# Patient Record
Sex: Male | Born: 1940
Health system: Southern US, Community
[De-identification: ages and names within clinical notes are randomized; demographics above are authoritative.]

## PROBLEM LIST (undated history)

## (undated) DIAGNOSIS — I5022 Chronic systolic (congestive) heart failure: Secondary | ICD-10-CM

## (undated) DIAGNOSIS — I340 Nonrheumatic mitral (valve) insufficiency: Secondary | ICD-10-CM

## (undated) DIAGNOSIS — R931 Abnormal findings on diagnostic imaging of heart and coronary circulation: Secondary | ICD-10-CM

## (undated) DIAGNOSIS — R351 Nocturia: Secondary | ICD-10-CM

## (undated) DIAGNOSIS — Z87442 Personal history of urinary calculi: Secondary | ICD-10-CM

## (undated) DIAGNOSIS — E785 Hyperlipidemia, unspecified: Secondary | ICD-10-CM

## (undated) DIAGNOSIS — Z86711 Personal history of pulmonary embolism: Secondary | ICD-10-CM

## (undated) DIAGNOSIS — M1990 Unspecified osteoarthritis, unspecified site: Secondary | ICD-10-CM

## (undated) DIAGNOSIS — I5189 Other ill-defined heart diseases: Secondary | ICD-10-CM

## (undated) DIAGNOSIS — Z951 Presence of aortocoronary bypass graft: Secondary | ICD-10-CM

## (undated) DIAGNOSIS — I4891 Unspecified atrial fibrillation: Secondary | ICD-10-CM

## (undated) DIAGNOSIS — I10 Essential (primary) hypertension: Secondary | ICD-10-CM

## (undated) HISTORY — PX: KNEE ARTHROSCOPY: SUR90

## (undated) HISTORY — PX: URETEROSCOPY WITH HOLMIUM LASER LITHOTRIPSY: SHX6645

## (undated) HISTORY — PX: APPENDECTOMY: SHX54

## (undated) HISTORY — PX: POLYPECTOMY: SHX149

## (undated) HISTORY — PX: TRANSURETHRAL RESECTION OF PROSTATE: SHX73

## (undated) HISTORY — PX: JOINT REPLACEMENT: SHX530

---

## 2004-06-17 ENCOUNTER — Other Ambulatory Visit: Payer: Self-pay

## 2005-11-28 ENCOUNTER — Ambulatory Visit: Payer: Self-pay | Admitting: Gastroenterology

## 2006-04-23 ENCOUNTER — Ambulatory Visit: Payer: Self-pay | Admitting: Urology

## 2006-05-04 ENCOUNTER — Ambulatory Visit: Payer: Self-pay | Admitting: Urology

## 2006-08-10 ENCOUNTER — Ambulatory Visit: Payer: Self-pay | Admitting: Urology

## 2007-09-27 ENCOUNTER — Ambulatory Visit: Payer: Self-pay | Admitting: Urology

## 2008-09-25 ENCOUNTER — Ambulatory Visit: Payer: Self-pay | Admitting: Urology

## 2009-04-16 ENCOUNTER — Ambulatory Visit: Payer: Self-pay | Admitting: Urology

## 2009-04-23 ENCOUNTER — Ambulatory Visit: Payer: Self-pay | Admitting: Urology

## 2009-04-25 ENCOUNTER — Ambulatory Visit: Payer: Self-pay | Admitting: Urology

## 2009-05-08 ENCOUNTER — Ambulatory Visit: Payer: Self-pay | Admitting: Urology

## 2009-05-10 ENCOUNTER — Ambulatory Visit: Payer: Self-pay | Admitting: Urology

## 2009-05-17 ENCOUNTER — Ambulatory Visit: Payer: Self-pay | Admitting: Urology

## 2009-05-21 ENCOUNTER — Ambulatory Visit: Payer: Self-pay | Admitting: Urology

## 2009-09-24 ENCOUNTER — Ambulatory Visit: Payer: Self-pay | Admitting: Urology

## 2010-03-04 ENCOUNTER — Ambulatory Visit: Payer: Self-pay | Admitting: Gastroenterology

## 2010-08-29 ENCOUNTER — Emergency Department: Payer: Self-pay | Admitting: Internal Medicine

## 2010-08-30 ENCOUNTER — Ambulatory Visit: Payer: Self-pay | Admitting: Urology

## 2010-09-02 ENCOUNTER — Ambulatory Visit: Payer: Self-pay | Admitting: Urology

## 2010-09-03 ENCOUNTER — Ambulatory Visit: Payer: Self-pay | Admitting: Urology

## 2010-09-30 ENCOUNTER — Ambulatory Visit: Payer: Self-pay | Admitting: Urology

## 2010-10-28 ENCOUNTER — Ambulatory Visit: Payer: Self-pay | Admitting: Urology

## 2011-03-04 ENCOUNTER — Ambulatory Visit: Payer: Self-pay | Admitting: Urology

## 2011-03-18 ENCOUNTER — Ambulatory Visit: Payer: Self-pay | Admitting: Urology

## 2011-07-24 ENCOUNTER — Ambulatory Visit: Payer: Self-pay | Admitting: Sports Medicine

## 2011-07-31 ENCOUNTER — Ambulatory Visit: Payer: Self-pay | Admitting: Orthopedic Surgery

## 2011-10-16 ENCOUNTER — Ambulatory Visit: Payer: Self-pay | Admitting: Urology

## 2011-11-20 ENCOUNTER — Ambulatory Visit: Payer: Self-pay | Admitting: Urology

## 2012-10-12 ENCOUNTER — Ambulatory Visit: Payer: Self-pay | Admitting: Urology

## 2013-06-28 ENCOUNTER — Ambulatory Visit: Payer: Self-pay | Admitting: Gastroenterology

## 2013-09-25 ENCOUNTER — Inpatient Hospital Stay: Payer: Self-pay | Admitting: Student

## 2013-09-25 LAB — URINALYSIS, COMPLETE
BILIRUBIN, UR: NEGATIVE
Glucose,UR: NEGATIVE mg/dL (ref 0–75)
KETONE: NEGATIVE
NITRITE: NEGATIVE
PH: 6 (ref 4.5–8.0)
RBC,UR: 43 /HPF (ref 0–5)
SPECIFIC GRAVITY: 1.015 (ref 1.003–1.030)
Squamous Epithelial: 1
WBC UR: 603 /HPF (ref 0–5)

## 2013-09-25 LAB — COMPREHENSIVE METABOLIC PANEL
ALK PHOS: 58 U/L
ALT: 29 U/L (ref 12–78)
ANION GAP: 7 (ref 7–16)
Albumin: 3.8 g/dL (ref 3.4–5.0)
BUN: 25 mg/dL — ABNORMAL HIGH (ref 7–18)
Bilirubin,Total: 1.3 mg/dL — ABNORMAL HIGH (ref 0.2–1.0)
CREATININE: 1.59 mg/dL — AB (ref 0.60–1.30)
Calcium, Total: 9.2 mg/dL (ref 8.5–10.1)
Chloride: 101 mmol/L (ref 98–107)
Co2: 28 mmol/L (ref 21–32)
EGFR (African American): 49 — ABNORMAL LOW
EGFR (Non-African Amer.): 42 — ABNORMAL LOW
Glucose: 156 mg/dL — ABNORMAL HIGH (ref 65–99)
OSMOLALITY: 280 (ref 275–301)
POTASSIUM: 3.9 mmol/L (ref 3.5–5.1)
SGOT(AST): 14 U/L — ABNORMAL LOW (ref 15–37)
SODIUM: 136 mmol/L (ref 136–145)
TOTAL PROTEIN: 6.9 g/dL (ref 6.4–8.2)

## 2013-09-25 LAB — CBC
HCT: 48.7 % (ref 40.0–52.0)
HGB: 16.5 g/dL (ref 13.0–18.0)
MCH: 30.7 pg (ref 26.0–34.0)
MCHC: 33.9 g/dL (ref 32.0–36.0)
MCV: 91 fL (ref 80–100)
PLATELETS: 133 10*3/uL — AB (ref 150–440)
RBC: 5.39 10*6/uL (ref 4.40–5.90)
RDW: 13.6 % (ref 11.5–14.5)
WBC: 21.8 10*3/uL — ABNORMAL HIGH (ref 3.8–10.6)

## 2013-09-25 LAB — TROPONIN I: Troponin-I: <0.02 ng/mL

## 2013-09-26 LAB — BASIC METABOLIC PANEL
Anion Gap: 5 — ABNORMAL LOW (ref 7–16)
BUN: 29 mg/dL — ABNORMAL HIGH (ref 7–18)
CHLORIDE: 101 mmol/L (ref 98–107)
CREATININE: 1.71 mg/dL — AB (ref 0.60–1.30)
Calcium, Total: 8.8 mg/dL (ref 8.5–10.1)
Co2: 28 mmol/L (ref 21–32)
EGFR (African American): 45 — ABNORMAL LOW
EGFR (Non-African Amer.): 39 — ABNORMAL LOW
GLUCOSE: 139 mg/dL — AB (ref 65–99)
OSMOLALITY: 276 (ref 275–301)
POTASSIUM: 3.4 mmol/L — AB (ref 3.5–5.1)
Sodium: 134 mmol/L — ABNORMAL LOW (ref 136–145)

## 2013-09-27 LAB — BASIC METABOLIC PANEL
Anion Gap: 7 (ref 7–16)
BUN: 37 mg/dL — ABNORMAL HIGH (ref 7–18)
CALCIUM: 8.2 mg/dL — AB (ref 8.5–10.1)
CO2: 24 mmol/L (ref 21–32)
Chloride: 103 mmol/L (ref 98–107)
Creatinine: 1.51 mg/dL — ABNORMAL HIGH (ref 0.60–1.30)
EGFR (African American): 52 — ABNORMAL LOW
EGFR (Non-African Amer.): 45 — ABNORMAL LOW
Glucose: 108 mg/dL — ABNORMAL HIGH (ref 65–99)
Osmolality: 277 (ref 275–301)
Potassium: 3.4 mmol/L — ABNORMAL LOW (ref 3.5–5.1)
SODIUM: 134 mmol/L — AB (ref 136–145)

## 2013-09-27 LAB — URINE CULTURE

## 2013-09-27 LAB — CBC WITH DIFFERENTIAL/PLATELET
BASOS ABS: 0.1 10*3/uL (ref 0.0–0.1)
BASOS PCT: 0.5 %
EOS PCT: 0 %
Eosinophil #: 0 10*3/uL (ref 0.0–0.7)
HCT: 40.4 % (ref 40.0–52.0)
HGB: 13.6 g/dL (ref 13.0–18.0)
LYMPHS ABS: 0.7 10*3/uL — AB (ref 1.0–3.6)
Lymphocyte %: 3.4 %
MCH: 30.4 pg (ref 26.0–34.0)
MCHC: 33.6 g/dL (ref 32.0–36.0)
MCV: 90 fL (ref 80–100)
MONOS PCT: 4.5 %
Monocyte #: 0.9 x10 3/mm (ref 0.2–1.0)
Neutrophil #: 18 10*3/uL — ABNORMAL HIGH (ref 1.4–6.5)
Neutrophil %: 91.6 %
PLATELETS: 95 10*3/uL — AB (ref 150–440)
RBC: 4.47 10*6/uL (ref 4.40–5.90)
RDW: 13.8 % (ref 11.5–14.5)
WBC: 19.6 10*3/uL — ABNORMAL HIGH (ref 3.8–10.6)

## 2013-09-28 LAB — CBC WITH DIFFERENTIAL/PLATELET
BASOS PCT: 0.2 %
Basophil #: 0 10*3/uL (ref 0.0–0.1)
EOS ABS: 0 10*3/uL (ref 0.0–0.7)
EOS PCT: 0.2 %
HCT: 39.9 % — AB (ref 40.0–52.0)
HGB: 13.7 g/dL (ref 13.0–18.0)
LYMPHS ABS: 0.6 10*3/uL — AB (ref 1.0–3.6)
Lymphocyte %: 5.6 %
MCH: 30.8 pg (ref 26.0–34.0)
MCHC: 34.4 g/dL (ref 32.0–36.0)
MCV: 90 fL (ref 80–100)
MONO ABS: 0.8 x10 3/mm (ref 0.2–1.0)
Monocyte %: 7.2 %
Neutrophil #: 9.7 10*3/uL — ABNORMAL HIGH (ref 1.4–6.5)
Neutrophil %: 86.8 %
RBC: 4.45 10*6/uL (ref 4.40–5.90)
RDW: 13.8 % (ref 11.5–14.5)
WBC: 11.2 10*3/uL — ABNORMAL HIGH (ref 3.8–10.6)

## 2013-09-28 LAB — PLATELET COUNT: Platelet: 87 10*3/uL — ABNORMAL LOW (ref 150–440)

## 2013-09-30 LAB — CULTURE, BLOOD (SINGLE)

## 2013-10-03 ENCOUNTER — Ambulatory Visit: Payer: Self-pay | Admitting: Urology

## 2013-10-05 ENCOUNTER — Ambulatory Visit: Payer: Self-pay | Admitting: Urology

## 2013-10-19 ENCOUNTER — Ambulatory Visit: Payer: Self-pay | Admitting: Urology

## 2013-10-20 LAB — CBC WITH DIFFERENTIAL/PLATELET
Basophil #: 0 10*3/uL (ref 0.0–0.1)
Basophil %: 0.4 %
EOS ABS: 0.2 10*3/uL (ref 0.0–0.7)
Eosinophil %: 2.7 %
HCT: 37.9 % — ABNORMAL LOW (ref 40.0–52.0)
HGB: 13 g/dL (ref 13.0–18.0)
LYMPHS PCT: 11.8 %
Lymphocyte #: 0.8 10*3/uL — ABNORMAL LOW (ref 1.0–3.6)
MCH: 30.8 pg (ref 26.0–34.0)
MCHC: 34.3 g/dL (ref 32.0–36.0)
MCV: 90 fL (ref 80–100)
Monocyte #: 0.5 x10 3/mm (ref 0.2–1.0)
Monocyte %: 7.9 %
NEUTROS PCT: 77.2 %
Neutrophil #: 5.1 10*3/uL (ref 1.4–6.5)
Platelet: 121 10*3/uL — ABNORMAL LOW (ref 150–440)
RBC: 4.21 10*6/uL — ABNORMAL LOW (ref 4.40–5.90)
RDW: 13.8 % (ref 11.5–14.5)
WBC: 6.5 10*3/uL (ref 3.8–10.6)

## 2013-10-21 LAB — PATHOLOGY REPORT

## 2015-01-13 NOTE — H&P (Signed)
PATIENT NAME:  [name] Goodman, [name] Goodman MR#:  [id number] DATE OF BIRTH:  13-Mar-1941  DATE OF ADMISSION:  09/25/2013  REFERRING PHYSICIAN: Dr. Mayford Knife.   PRIMARY CARE PHYSICIAN: Dr. Tera Mater.   CHIEF COMPLAINT: Vomiting.  HISTORY OF PRESENT ILLNESS: This is a 74 year old Caucasian gentleman with past medical history of benign prostatic hypertrophy, as well as hypertension, presenting with nausea, vomiting. He describes one day duration of nausea, vomiting of nonbloody, nonbilious emesis 2 to 3 bouts thus far without associated abdominal pain or change in bowel habits, including diarrhea or constipation. He does have subjective chills as well as dysuria and increased frequency. His urinary symptoms started approximately two days ago. He is now having frequent urination approximately every 30 minutes, having very low volumes. On arrival to the Emergency Department he was found to have a markedly elevated white count.   REVIEW OF SYSTEMS: CONSTITUTIONAL: Positive for chills. Denies fever, fatigue.  EYES: Denies blurred vision, double vision, eye pain.  EARS, NOSE, THROAT: Denies tinnitus, ear pain, hearing loss.  RESPIRATORY: Denies cough, wheeze, shortness of breath.  CARDIOVASCULAR: Denies chest pain, palpitations, edema.  GASTROINTESTINAL: Positive for nausea, vomiting; however, denies diarrhea, abdominal pain.  GENITOURINARY: Positive for dysuria, increased urinary frequency, denies hematuria.  ENDOCRINE: Positive for nocturia. Denies thyroid problems.  HEMATOLOGIC/LYMPHATIC: Denies bruising or bleeding.  SKIN: Denies rashes or lesions.  MUSCULOSKELETAL: Denies pain in neck, back, shoulder, knees, hips, arthritic symptoms.  NEUROLOGIC: Denies paralysis, paresthesias.  PSYCHIATRIC: Denies anxiety or depressive symptoms.  Otherwise, full review of systems performed by me is negative.   PAST MEDICAL HISTORY: Hypertension, hyperlipidemia and benign prostatic hypertrophy.   SOCIAL HISTORY:  Remote history of tobacco use. Occasional alcohol use, denies any drug usage.   FAMILY HISTORY:  Denies any knowledge of cardiovascular diseases or diabetes.   ALLERGIES: No known drug allergies.   HOME MEDICATIONS: Include: Aspirin 81 mg daily, Cardizem 240 mg p.o. daily, lisinopril 20 mg p.o. daily, hydrochlorothiazide 25 mg p.o. daily, Lipitor 10 mg p.o. daily, Flomax 0.4 mg p.o. daily, Lipitor 10 mg p.o. daily.   PHYSICAL EXAMINATION: VITAL SIGNS: Temperature 99.8, heart rate 77, respirations 16, blood pressure 135/63, saturating 93% on room air. Weight 95.3 kg, BMI 28.5.  GENERAL: Well-nourished, well-developed, Caucasian gentleman, currently in no acute distress.  HEAD: Normocephalic, atraumatic.  EYES: Pupils are equal, round and reactive to light. Extraocular muscles intact. No scleral icterus.  MOUTH: Moist mucosal membranes. Dentition intact. No abscess noted.  EAR, NOSE, THROAT:  Throat clear without exudates. No external lesions.  NECK: Supple. No thyromegaly. No nodules. No JVD.  PULMONARY: Clear to auscultation bilaterally. No wheezes, rubs or rhonchi. No use of accessory muscles. Good respiratory effort.  CHEST: Nontender to palpation.  CARDIOVASCULAR: S1, S2, regular rate and rhythm. No murmurs, rubs or gallops. No edema. Pedal pulses 2+ bilaterally.  GASTROINTESTINAL: Soft, nontender, nondistended. No masses. Positive bowel sounds. No hepatosplenomegaly. Costovertebral angle tenderness is negative on both sides.  MUSCULOSKELETAL: No swelling, clubbing or edema. Range of motion full in all extremities.  NEUROLOGIC: Cranial nerves II through XII intact. No gross focal neurological deficits. Sensation intact. Reflexes intact.  SKIN: No ulcerations, lesions, rashes, cyanosis. Skin warm, dry. Turgor is intact.  PSYCHIATRIC: Mood and affect within normal limits. The patient is awake, alert, oriented x 3. Insight and judgment intact.   LABORATORY DATA: Sodium 136, potassium 3.9,  chloride 101, bicarbonate 28, BUN 25, creatinine 1.59, glucose 156. LFTs: Total bilirubin 1.3, otherwise within normal limits. Troponin I less  than 0.02. WBC 21.8, hemoglobin 16.5, platelets of 133. Urinalysis: WBC 603, RBC 43, leukocyte esterase 3+, nitrite negative, lactic acid 2.8.   ASSESSMENT AND PLAN: A 74 year old gentleman with history of hypertension and benign prostatic hypertrophy, presenting with vomiting, found to have a urinary tract infection. 1.  Urinary tract infection quite meet sepsis criteria, only has leukocytosis. Otherwise vital signs are stable. Regardless, panculture including blood and urine, ceftriaxone for antibiotic coverage. Follow culture data and adjust as according, IV fluid hydration, keep mean artery pressure. 65.  2.  Acute kidney injury. IV fluid hydration, normal saline and follow renal function.  3.  Hypertension. Continue Cardizem, hold hydrochlorothiazide and ACE inhibitors given acute kidney injury and increased urinary frequency.  4.  Deep venous thrombosis profiles with heparin subcutaneous.   CODE STATUS: The patient is full code.   TIME SPENT: 45 minutes    ____________________________ Cletis Athensavid K. Hower, MD dkh:cc D: 09/26/2013 00:04:59 ET T: 09/26/2013 00:37:18 ET JOB#: [id number]  cc: Cletis Athensavid K. Hower, MD, <Dictator> DAVID Synetta ShadowK HOWER MD ELECTRONICALLY SIGNED 09/26/2013 1:15

## 2015-01-13 NOTE — Op Note (Signed)
PATIENT NAME:  [name] Goodman, [name] Goodman DATE OF BIRTH:  09-26-1940  DATE OF PROCEDURE:  10/19/2013  PREOPERATIVE DIAGNOSES:  1.  Benign prostatic hypertrophy with lower urinary tract symptoms.  2.  Urinary retention secondary to above.   POSTOPERATIVE DIAGNOSES: 1.  Benign prostatic hypertrophy with lower urinary tract symptoms.  2.  Urinary retention secondary to above.   PROCEDURE: Transurethral resection of prostate.   SURGEON: Irineo AxonScott Janaysha Depaulo, M.D.   ASSISTANT: None.   ANESTHESIA: General.   INDICATIONS: A 74 year old male with a long history of BPH, on tamsulosin. He had a recent episode of prostatitis with urinary retention. He failed voiding trials x 3. Cystoscopy was remarkable for lateral lobe enlargement and a moderate median lobe. After discussion of treatment options, he has elected TURP.   DESCRIPTION OF PROCEDURE: The patient was taken to the cystoscopy suite and placed on the table in the supine position. General anesthetic was administered via an LMA and he was placed in the low lithotomy position. His external genitalia were prepped and draped in the usual fashion after removing his Foley catheter. Timeout was performed per protocol with all in agreement.   A 27-French continuous flow resectoscope sheath with visual obturator was placed without difficulty. The Iglesias resectoscope was then placed into the sheath. Bladder mucosa was inspected and there were inflammatory changes related to indwelling Foley. There were 3 small stones noted within the bladder. There was a moderate median lobe present. There were touching lateral lobes with a prostatic urethral length of approximately 3 cm. The median lobe was then resected down to capsular fibers and hemostasis was obtained with cautery. Attention was directed to the left lateral lobe which was resected from the 2 o'clock position to the 6 o'clock position with the limits of resection being the bladder neck proximally  and verumontanum distally. All chips were removed with irrigation. Attention was directed to the right lateral lobe which was resected in a similar fashion. After removal of all chips and small bladder stones via irrigation the button electrode was placed in the resectoscope sheath. Hemostasis was with obtained with cautery.   At the completion of procedure, hemostasis was adequate. With the scope was positioned at the verumontanum, the channel was opened. The resectoscope was removed. A 22-French, 3-way Foley catheter was placed to continuous bladder irrigation with return of pink-tinged effluent. A B and O suppository was placed per rectum. The patient was taken to PACU in stable condition. There were no complications.   EBL: Approximately 50 mL.      ____________________________ Verna CzechScott C. Lonna CobbStoioff, MD scs:cs D: 10/19/2013 14:26:06 ET T: 10/19/2013 18:26:34 ET JOB#: [id number]  cc: [name] PicketScott C. Lonna CobbStoioff, MD, <Dictator> [name] AltesSCOTT C Kenshawn Maciolek MD ELECTRONICALLY SIGNED 10/26/2013 11:21

## 2015-01-13 NOTE — Discharge Summary (Signed)
PATIENT NAME:  [name], [name] Goodman MR#:  [id number] DATE OF BIRTH:  [birth year]  DATE OF ADMISSION:  09/25/2013 DATE OF DISCHARGE:  09/28/2013  CHIEF COMPLAINT:  Vomiting.  PRIMARY CARE PHYSICIAN: Rhona Leavens. Burnett Sheng, MD  DISCHARGE DIAGNOSES: 1.  Severe sepsis from urinary tract infection.  2.  Renal failure, likely acute from sepsis and gastrointestinal losses.  3.  Nausea, vomiting, resolved.  4.  Hypertension.  5.  Urinary retention, requiring Foley.  6.  Hyperlipidemia.  7.  History of benign prostatic hypertrophy.   DISCHARGE MEDICATIONS:  Lipitor 10 mg daily, aspirin 81 mg daily, diltiazem 240 mg extended-release once a day, tamsulosin 0.4 mg once a day, fosinopril 20 mg once a day once approved by your doctor, hydrochlorothiazide 25 mg once a day once approved by your doctor, cephalexin 500 mg every 12 hours for 7 more days.   He will be going home with Foley to leg bag.   DIET: Low sodium, low fat, low cholesterol.   ACTIVITY: As tolerated.   FOLLOWUP: Please follow with PCP within 1 to 2 weeks. Please follow with Dr. Lonna Cobb on Monday as scheduled by you. Check kidney function test within a week and follow with Dr. Lonna Cobb for bladder training to take out your Foley catheter.   DISPOSITION: Home.   CODE STATUS: Full code.   SIGNIFICANT LABS AND IMAGING: Initial BUN 25, creatinine 1.59, sodium 136, potassium 3.9, last creatinine of 1.51. Initial white count of 21.8, by discharge it was 11.2. Urine cultures grew E. coli sensitive to cephalosporins. Blood cultures no growth to date. Lactic acid was 2.8 on admission. 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 Chest x-ray, 1 view, no infiltrate or edema.   HISTORY OF PRESENT ILLNESS AND HOSPITAL COURSE: For full details of H and P, please see the dictation on 01/04 by Dr. Clint Guy but, briefly, this is  a pleasant [name] Goodman with history of BPH, hypertension, hyperlipidemia, who came in with nausea, vomiting with  nonbloody, nonbilious emesis and subjective chills as well as dysuria and increased frequency of urination. He was noted to have significant leukocytosis on admission with a positive UA that meets sepsis criteria. He was admitted to the hospitalist service, started on ceftriaxone, IV fluid resuscitation. The patient also was noted to have renal failure with creatinine in the mid-ones. We do not have recent labs of note but I suspect that the renal failure likely is new. The urine cultures grew E. coli and the antibiotics were changed to cephalexin. The fever has resolved, the sepsis has resolved and he does feel better. He was seen by physical therapy and their recommendation was home. Of note, the patient did have a bout of urinary retention and ultimately required a Foley. He does follow with Dr. Lonna Cobb as an outpatient and has an appointment on Monday for bladder training. His creatinine has remained stable. His nausea and vomiting which likely is from the UTI has resolved. In regards to his hypertension, we will hold the hydrochlorothiazide and the ACE inhibitor for now until his GFR is deemed to be stable by his outpatient physicians. At this point, he will be discharged.   PHYSICAL EXAMINATION: VITAL SIGNS: On the day of discharge, temperature was 97.8, pulse rate 54, respiratory rate 20, blood pressure 122/73, O2 sat 94%.  GENERAL: The patient a well-developed Goodman in no obvious distress, talking in full sentences.  HEENT: Normocephalic, atraumatic. Moist mucous membranes.  HEART: Normal S1, S2.  LUNGS: Clear.  ABDOMEN: Soft, nontender, nondistended.  He does have a Foley draining yellow urine.  EXTREMITIES:  No pitting edema.   CODE STATUS:  The patient is a full code.   TOTAL TIME SPENT: 40 minutes.   ____________________________ Krystal EatonShayiq Seraphim Trow, MD sa:cs D: 09/28/2013  17:30:54 ET T: 09/28/2013 20:49:23 ET JOB#: [id number]  cc: Krystal EatonShayiq Babatunde Seago, MD, <Dictator> Rhona LeavensJames F. Burnett ShengHedrick, MD Scott C. Lonna CobbStoioff, MD  Krystal EatonSHAYIQ Annya Lizana MD ELECTRONICALLY SIGNED 10/04/2013 11:35

## 2015-07-03 ENCOUNTER — Ambulatory Visit: Payer: Self-pay | Admitting: Orthopedic Surgery

## 2015-07-03 NOTE — Progress Notes (Signed)
Preoperative surgical orders have been place into the Epic hospital system for BRAXDON GAPPA on 07/03/2015, 12:41 PM  by Patrica Duel for surgery on 07-23-2015.  Preop Total Knee orders including Experal, IV Tylenol, and IV Decadron as long as there are no contraindications to the above medications. Avel Peace, PA-C

## 2015-07-03 NOTE — Progress Notes (Signed)
Need orders for 10-31 surgery in epic pre op is 10-25 16, thanks

## 2015-07-13 NOTE — Patient Instructions (Addendum)
YOUR PROCEDURE IS SCHEDULED ON : 07/23/15  REPORT TO [facility] MAIN ENTRANCE FOLLOW SIGNS TO EAST ELEVATOR - GO TO 3rd FLOOR CHECK IN AT 3 EAST NURSES STATION (SHORT STAY) AT:  5:15 AM  CALL THIS NUMBER IF YOU HAVE PROBLEMS THE MORNING OF SURGERY [phone]  REMEMBER:ONLY 1 PER PERSON MAY GO TO SHORT STAY WITH YOU TO GET READY THE MORNING OF YOUR SURGERY  DO NOT EAT FOOD OR DRINK LIQUIDS AFTER MIDNIGHT  TAKE THESE MEDICINES THE MORNING OF SURGERY:TIAZAC (DILTIAZEM)  YOU MAY NOT HAVE ANY METAL ON YOUR BODY INCLUDING HAIR PINS AND PIERCING'S. DO NOT WEAR JEWELRY, MAKEUP, LOTIONS, POWDERS OR PERFUMES. DO NOT WEAR NAIL POLISH. DO NOT SHAVE 48 HRS PRIOR TO SURGERY. MEN MAY SHAVE FACE AND NECK.  DO NOT BRING VALUABLES TO HOSPITAL. [facility] IS NOT RESPONSIBLE FOR VALUABLES.  CONTACTS, DENTURES OR PARTIALS MAY NOT BE WORN TO SURGERY. LEAVE SUITCASE IN CAR. CAN BE BROUGHT TO ROOM AFTER SURGERY.  PATIENTS DISCHARGED THE DAY OF SURGERY WILL NOT BE ALLOWED TO DRIVE HOME.  PLEASE READ OVER THE FOLLOWING INSTRUCTION SHEETS _________________________________________________________________________________                                          [facility] - PREPARING FOR SURGERY  Before surgery, you can play an important role.  Because skin is not sterile, your skin needs to be as free of germs as possible.  You can reduce the number of germs on your skin by washing with CHG (chlorahexidine gluconate) soap before surgery.  CHG is an antiseptic cleaner which kills germs and bonds with the skin to continue killing germs even after washing. Please DO NOT use if you have an allergy to CHG or antibacterial soaps.  If your skin becomes reddened/irritated stop using the CHG and inform your nurse when you arrive at Short Stay. Do not shave (including legs and underarms) for at least 48 hours prior to the first CHG shower.  You may shave your face. Please follow these instructions  carefully:   1.  Shower with CHG Soap the night before surgery and the  morning of Surgery.   2.  If you choose to wash your hair, wash your hair first as usual with your  normal  Shampoo.   3.  After you shampoo, rinse your hair and body thoroughly to remove the  shampoo.                                         4.  Use CHG as you would any other liquid soap.  You can apply chg directly  to the skin and wash . Gently wash with scrungie or clean wascloth    5.  Apply the CHG Soap to your body ONLY FROM THE NECK DOWN.   Do not use on open                           Wound or open sores. Avoid contact with eyes, ears mouth and genitals (private parts).                        Genitals (private parts) with your normal soap.  6.  Wash thoroughly, paying special attention to the area where your surgery  will be performed.   7.  Thoroughly rinse your body with warm water from the neck down.   8.  DO NOT shower/wash with your normal soap after using and rinsing off  the CHG Soap .                9.  Pat yourself dry with a clean towel.             10.  Wear clean night clothes to bed after shower             11.  Place clean sheets on your bed the night of your first shower and do not  sleep with pets.  Day of Surgery : Do not apply any lotions/deodorants the morning of surgery.  Please wear clean clothes to the hospital/surgery center.  FAILURE TO FOLLOW THESE INSTRUCTIONS MAY RESULT IN THE CANCELLATION OF YOUR SURGERY    PATIENT SIGNATURE_________________________________  ______________________________________________________________________     [name] Goodman  An incentive spirometer is a tool that can help keep your lungs clear and active. This tool measures how well you are filling your lungs with each breath. Taking long deep breaths may help reverse or decrease the chance of developing breathing (pulmonary) problems (especially infection) following:  A long  period of time when you are unable to move or be active. BEFORE THE PROCEDURE   If the spirometer includes an indicator to show your best effort, your nurse or respiratory therapist will set it to a desired goal.  If possible, sit up straight or lean slightly forward. Try not to slouch.  Hold the incentive spirometer in an upright position. INSTRUCTIONS FOR USE   Sit on the edge of your bed if possible, or sit up as far as you can in bed or on a chair.  Hold the incentive spirometer in an upright position.  Breathe out normally.  Place the mouthpiece in your mouth and seal your lips tightly around it.  Breathe in slowly and as deeply as possible, raising the piston or the ball toward the top of the column.  Hold your breath for 3-5 seconds or for as long as possible. Allow the piston or ball to fall to the bottom of the column.  Remove the mouthpiece from your mouth and breathe out normally.  Rest for a few seconds and repeat Steps 1 through 7 at least 10 times every 1-2 hours when you are awake. Take your time and take a few normal breaths between deep breaths.  The spirometer may include an indicator to show your best effort. Use the indicator as a goal to work toward during each repetition.  After each set of 10 deep breaths, practice coughing to be sure your lungs are clear. If you have an incision (the cut made at the time of surgery), support your incision when coughing by placing a pillow or rolled up towels firmly against it. Once you are able to get out of bed, walk around indoors and cough well. You may stop using the incentive spirometer when instructed by your caregiver.  RISKS AND COMPLICATIONS  Take your time so you do not get dizzy or light-headed.  If you are in pain, you may need to take or ask for pain medication before doing incentive spirometry. It is harder to take a deep breath if you are having pain. AFTER USE  Rest and breathe slowly and easily.  It can  be helpful to keep track of a log of your progress. Your caregiver can provide you with a simple table to help with this. If you are using the spirometer at home, follow these instructions: [place] IF:   You are having difficultly using the spirometer.  You have trouble using the spirometer as often as instructed.  Your pain medication is not giving enough relief while using the spirometer.  You develop fever of 100.5 F (38.1 C) or higher. SEEK IMMEDIATE MEDICAL CARE IF:   You cough up bloody sputum that had not been present before.  You develop fever of 102 F (38.9 C) or greater.  You develop worsening pain at or near the incision site. MAKE SURE YOU:   Understand these instructions.  Will watch your condition.  Will get help right away if you are not doing well or get worse. Document Released: 01/19/2007 Document Revised: 12/01/2011 Document Reviewed: 03/22/2007 [employer] Patient Information 2014 [employer], [place].   ________________________________________________________________________  WHAT IS A BLOOD TRANSFUSION? Blood Transfusion Information  A transfusion is the replacement of blood or some of its parts. Blood is made up of multiple cells which provide different functions.  Red blood cells carry oxygen and are used for blood loss replacement.  White blood cells fight against infection.  Platelets control bleeding.  Plasma helps clot blood.  Other blood products are available for specialized needs, such as hemophilia or other clotting disorders. BEFORE THE TRANSFUSION  Who gives blood for transfusions?   Healthy volunteers who are fully evaluated to make sure their blood is safe. This is blood bank blood. Transfusion therapy is the safest it has ever been in the practice of medicine. Before blood is taken from a donor, a complete history is taken to make sure that person has no history of diseases nor engages in risky social behavior (examples are  intravenous drug use or sexual activity with multiple partners). The donor's travel history is screened to minimize risk of transmitting infections, such as malaria. The donated blood is tested for signs of infectious diseases, such as HIV and hepatitis. The blood is then tested to be sure it is compatible with you in order to minimize the chance of a transfusion reaction. If you or a relative donates blood, this is often done in anticipation of surgery and is not appropriate for emergency situations. It takes many days to process the donated blood. RISKS AND COMPLICATIONS Although transfusion therapy is very safe and saves many lives, the main dangers of transfusion include:   Getting an infectious disease.  Developing a transfusion reaction. This is an allergic reaction to something in the blood you were given. Every precaution is taken to prevent this. The decision to have a blood transfusion has been considered carefully by your caregiver before blood is given. Blood is not given unless the benefits outweigh the risks. AFTER THE TRANSFUSION  Right after receiving a blood transfusion, you will usually feel much better and more energetic. This is especially true if your red blood cells have gotten low (anemic). The transfusion raises the level of the red blood cells which carry oxygen, and this usually causes an energy increase.  The nurse administering the transfusion will monitor you carefully for complications. HOME CARE INSTRUCTIONS  No special instructions are needed after a transfusion. You may find your energy is better. Speak with your caregiver about any limitations on activity for underlying diseases you may have. SEEK MEDICAL CARE IF:   Your  condition is not improving after your transfusion.  You develop redness or irritation at the intravenous (IV) site. SEEK IMMEDIATE MEDICAL CARE IF:  Any of the following symptoms occur over the next 12 hours:  Shaking chills.  You have a  temperature by mouth above 102 F (38.9 C), not controlled by medicine.  Chest, back, or muscle pain.  People around you feel you are not acting correctly or are confused.  Shortness of breath or difficulty breathing.  Dizziness and fainting.  You get a rash or develop hives.  You have a decrease in urine output.  Your urine turns a dark color or changes to pink, red, or brown. Any of the following symptoms occur over the next 10 days:  You have a temperature by mouth above 102 F (38.9 C), not controlled by medicine.  Shortness of breath.  Weakness after normal activity.  The white part of the eye turns yellow (jaundice).  You have a decrease in the amount of urine or are urinating less often.  Your urine turns a dark color or changes to pink, red, or brown. Document Released: 09/05/2000 Document Revised: 12/01/2011 Document Reviewed: 04/24/2008 [facility] Patient Information 2014 [place], [place].  _______________________________________________________________________

## 2015-07-17 ENCOUNTER — Encounter (HOSPITAL_COMMUNITY): Payer: Self-pay

## 2015-07-17 ENCOUNTER — Encounter (HOSPITAL_COMMUNITY)
Admission: RE | Admit: 2015-07-17 | Discharge: 2015-07-17 | Disposition: A | Discharge status: Home / Self Care | Payer: Medicare Other | Source: Ambulatory Visit | Attending: Orthopedic Surgery | Admitting: Orthopedic Surgery

## 2015-07-17 DIAGNOSIS — Z01818 Encounter for other preprocedural examination: Secondary | ICD-10-CM | POA: Insufficient documentation

## 2015-07-17 DIAGNOSIS — M179 Osteoarthritis of knee, unspecified: Secondary | ICD-10-CM | POA: Insufficient documentation

## 2015-07-17 HISTORY — DX: Essential (primary) hypertension: I10

## 2015-07-17 HISTORY — DX: Nocturia: R35.1

## 2015-07-17 HISTORY — DX: Unspecified osteoarthritis, unspecified site: M19.90

## 2015-07-17 HISTORY — DX: Personal history of urinary calculi: Z87.442

## 2015-07-17 LAB — ABO/RH: ABO/RH(D): O POS

## 2015-07-17 LAB — SURGICAL PCR SCREEN
MRSA, PCR: NEGATIVE
STAPHYLOCOCCUS AUREUS: NEGATIVE

## 2015-07-17 LAB — PROTIME-INR
INR: 1.06 (ref 0.00–1.49)
Prothrombin Time: 14 seconds (ref 11.6–15.2)

## 2015-07-17 LAB — APTT: aPTT: 31 seconds (ref 24–37)

## 2015-07-22 ENCOUNTER — Ambulatory Visit: Payer: Self-pay | Admitting: Orthopedic Surgery

## 2015-07-22 NOTE — H&P (Signed)
[name] Goodman DOB: 10/04/1940 Divorced / Language: [place] / Race: White Male Date of Admission:  07/23/2015 CC:  Left Knee Pain History of Present Illness  The patient is a 74 year old male who comes in for a preoperative History and Physical. The patient is scheduled for a left total knee arthroplasty to be performed by Dr. Gus RankinFrank V. Aluisio, [facility] on 07-23-2015. The patient is a 74 year old male who presented with knee complaints. The patient was seen in referral from Dr. Zachery DauerBarnes. The patient reports left knee symptoms including: pain, instability, locking, catching, giving way and pt. is limping which began 3 year(s) ago. Prior to being seen the patient was previously evaluated by a colleague. Previous work-up for this problem has included knee x-rays. Past treatment for this problem has included intra-articular injection of corticosteroids. Symptoms are reported to be located in the left medial knee. Unfortunately, his left knee is getting progressively worse over time. He has been dealing with this for several years and has been told in the past that he needs to have his knee replaced. He has been told that by multiple orthopedists. He had an arthroscopy at one point and even after the arthroscopy, he was told that he needed a replacement. He is at a stage now where the knees effecting what he can and cannot do and is effecting his daily life. He is an avid golfer and is getting more difficult to do that because of the knee. He is not having hip pain with this. He is not having lower extremity weakness or paresthesia with this. He is ready to proceed with surgery. They have been treated conservatively in the past for the above stated problem and despite conservative measures, they continue to have progressive pain and severe functional limitations and dysfunction. They have failed non-operative management including home exercise, medications, and injections. It is felt that  they would benefit from undergoing total joint replacement. Risks and benefits of the procedure have been discussed with the patient and they elect to proceed with surgery. There are no active contraindications to surgery such as ongoing infection or rapidly progressive neurological disease.  Problem List/Past Medical Primary osteoarthritis of left knee (M17.12) High blood pressure Kidney Stone  Allergies  No Known Drug Allergies  Family History Cancer mother and father Congestive Heart Failure father  Social History Illicit drug use no Living situation live alone Exercise Exercises daily; does running / walking Drug/Alcohol Rehab (Currently) no Drug/Alcohol Rehab (Previously) no Tobacco use former smoker; smoke(d) less than 1/2 pack(s) per day Pain Contract no Marital status divorced Number of flights of stairs before winded greater than 5 Current work status retired Alcohol use never consumed alcohol Children 3  Medication History Fosinopril Sodium (20MG  Tablet, Oral) Active. Hydrochlorothiazide (25MG  Tablet, Oral) Active. Atorvastatin Calcium (10MG  Tablet, Oral) Active. Taztia XT (240MG  Capsule ER 24HR, Oral) Active. Aspirin (325MG  Tablet, 1 (one) Oral) Active.  Past Surgical History Prostatectomy; Transurethral Arthroscopy of Knee left Colon Polyp Removal - Colonoscopy  Review of Systems General Not Present- Chills, Fatigue, Fever, Memory Loss, Night Sweats, Weight Gain and Weight Loss. Skin Not Present- Eczema, Hives, Itching, Lesions and Rash. HEENT Not Present- Dentures, Double Vision, Headache, Hearing Loss, Tinnitus and Visual Loss. Respiratory Not Present- Allergies, Chronic Cough, Coughing up blood, Shortness of breath at rest and Shortness of breath with exertion. Cardiovascular Not Present- Chest Pain, Difficulty Breathing Lying Down, Murmur, Palpitations, Racing/skipping heartbeats and Swelling. Gastrointestinal Not Present-  Abdominal  Pain, Bloody Stool, Constipation, Diarrhea, Difficulty Swallowing, Heartburn, Jaundice, Loss of appetitie, Nausea and Vomiting. Male Genitourinary Not Present- Blood in Urine, Discharge, Flank Pain, Incontinence, Painful Urination, Urgency, Urinary frequency, Urinary Retention, Urinating at Night and Weak urinary stream. Musculoskeletal Not Present- Back Pain, Joint Pain, Joint Swelling, Morning Stiffness, Muscle Pain, Muscle Weakness and Spasms. Neurological Not Present- Blackout spells, Difficulty with balance, Dizziness, Paralysis, Tremor and Weakness. Psychiatric Not Present- Insomnia.  Vitals  Weight: 210 lb Height: 73in Weight was reported by patient. Height was reported by patient. Body Surface Area: 2.2 m Body Mass Index: 27.71 kg/m  BP: 138/78 (Sitting, Right Arm, Standard)  Physical Exam General Mental Status -Alert, cooperative and good historian. General Appearance-pleasant, Not in acute distress. Orientation-Oriented X3. Build & Nutrition-Well nourished and Well developed.  Head and Neck Head-normocephalic, atraumatic . Neck Global Assessment - supple, no bruit auscultated on the right, no bruit auscultated on the left.  Eye Pupil - Bilateral-Regular and Round. Motion - Bilateral-EOMI.  Chest and Lung Exam Auscultation Breath sounds - clear at anterior chest wall and clear at posterior chest wall. Adventitious sounds - No Adventitious sounds.  Cardiovascular Auscultation Rhythm - Regular rate and rhythm. Heart Sounds - S1 WNL and S2 WNL. Murmurs & Other Heart Sounds - Auscultation of the heart reveals - No Murmurs.  Abdomen Palpation/Percussion Tenderness - Abdomen is non-tender to palpation. Rigidity (guarding) - Abdomen is soft. Auscultation Auscultation of the abdomen reveals - Bowel sounds normal.  Male Genitourinary Note: Not done, not pertinent to present illness   Musculoskeletal Note: A well-developed male, alert  and oriented, no apparent distress. Evaluation of his hips show normal range of motion without discomfort. Right knee shows no effusion with range being 0 to 135, no tenderness or instability. Left knee, slight varus, range 5 to 130, marked crepitus on range of motion, tenderness medial greater than lateral with no instability noted. Pulse, sensation, motor intact.  RADIOGRAPHS AP both knees and lateral of the left showed he has bone on bone arthritis in the medial and patellofemoral compartments of the left knee.  Assessment & Plan Primary osteoarthritis of left knee (M17.12) Note:Surgical Plans: Left Total Knee Replacement  Disposition: Home  PCP: Dr. Burnett Sheng - Patient has been seen preoperatively and felt to be stable for surgery.  IV TXA  Anesthesia Issues: None  Signed electronically by Lauraine Rinne, III PA-C

## 2015-07-23 ENCOUNTER — Encounter (HOSPITAL_COMMUNITY): Payer: Self-pay | Admitting: *Deleted

## 2015-07-23 ENCOUNTER — Inpatient Hospital Stay (HOSPITAL_COMMUNITY): Payer: Medicare Other | Admitting: Certified Registered Nurse Anesthetist

## 2015-07-23 ENCOUNTER — Encounter (HOSPITAL_COMMUNITY)
Admission: RE | Disposition: A | Discharge status: Home Health | Payer: Self-pay | Source: Ambulatory Visit | Attending: Orthopedic Surgery

## 2015-07-23 ENCOUNTER — Inpatient Hospital Stay (HOSPITAL_COMMUNITY)
Admission: RE | Admit: 2015-07-23 | Discharge: 2015-07-24 | DRG: 470 | Disposition: A | Discharge status: Home Health | Payer: Medicare Other | Source: Ambulatory Visit | Attending: Orthopedic Surgery | Admitting: Orthopedic Surgery

## 2015-07-23 DIAGNOSIS — I10 Essential (primary) hypertension: Secondary | ICD-10-CM | POA: Diagnosis present

## 2015-07-23 DIAGNOSIS — Z87891 Personal history of nicotine dependence: Secondary | ICD-10-CM | POA: Diagnosis not present

## 2015-07-23 DIAGNOSIS — M1710 Unilateral primary osteoarthritis, unspecified knee: Secondary | ICD-10-CM | POA: Diagnosis present

## 2015-07-23 DIAGNOSIS — M179 Osteoarthritis of knee, unspecified: Secondary | ICD-10-CM | POA: Diagnosis present

## 2015-07-23 DIAGNOSIS — Z87442 Personal history of urinary calculi: Secondary | ICD-10-CM

## 2015-07-23 DIAGNOSIS — M25562 Pain in left knee: Secondary | ICD-10-CM | POA: Diagnosis present

## 2015-07-23 DIAGNOSIS — Z01812 Encounter for preprocedural laboratory examination: Secondary | ICD-10-CM | POA: Diagnosis not present

## 2015-07-23 DIAGNOSIS — M1712 Unilateral primary osteoarthritis, left knee: Secondary | ICD-10-CM | POA: Diagnosis present

## 2015-07-23 HISTORY — PX: TOTAL KNEE ARTHROPLASTY: SHX125

## 2015-07-23 LAB — TYPE AND SCREEN
ABO/RH(D): O POS
ANTIBODY SCREEN: NEGATIVE

## 2015-07-23 SURGERY — ARTHROPLASTY, KNEE, TOTAL
Anesthesia: Spinal | Site: Knee | Laterality: Left

## 2015-07-23 MED ORDER — PROPOFOL 500 MG/50ML IV EMUL
INTRAVENOUS | Status: DC | PRN
  Administered 2015-07-23: 100 ug/kg/min via INTRAVENOUS

## 2015-07-23 MED ORDER — FENTANYL CITRATE (PF) 100 MCG/2ML IJ SOLN
INTRAMUSCULAR | Status: AC
  Filled 2015-07-23: qty 4

## 2015-07-23 MED ORDER — BUPIVACAINE LIPOSOME 1.3 % IJ SUSP
INTRAMUSCULAR | Status: DC | PRN
  Administered 2015-07-23: 20 mL

## 2015-07-23 MED ORDER — BISACODYL 10 MG RE SUPP: 10.0000 mg | Freq: Every day | RECTAL | Status: DC | PRN

## 2015-07-23 MED ORDER — CEFAZOLIN SODIUM-DEXTROSE 2-3 GM-% IV SOLR
INTRAVENOUS | Status: AC
  Filled 2015-07-23: qty 50

## 2015-07-23 MED ORDER — BUPIVACAINE LIPOSOME 1.3 % IJ SUSP
20.0000 mL | Freq: Once | INTRAMUSCULAR | Status: DC
  Filled 2015-07-23: qty 20

## 2015-07-23 MED ORDER — HYDROCHLOROTHIAZIDE 25 MG PO TABS
25.0000 mg | ORAL_TABLET | Freq: Every morning | ORAL | Status: DC
  Administered 2015-07-24: 25 mg via ORAL
  Filled 2015-07-23: qty 1

## 2015-07-23 MED ORDER — OXYCODONE HCL 5 MG PO TABS
5.0000 mg | ORAL_TABLET | ORAL | Status: DC | PRN
  Administered 2015-07-23 – 2015-07-24 (×2): 5 mg via ORAL
  Filled 2015-07-23 (×3): qty 1

## 2015-07-23 MED ORDER — CEFAZOLIN SODIUM-DEXTROSE 2-3 GM-% IV SOLR
2.0000 g | INTRAVENOUS | Status: AC
  Administered 2015-07-23: 2 g via INTRAVENOUS

## 2015-07-23 MED ORDER — DOCUSATE SODIUM 100 MG PO CAPS
100.0000 mg | ORAL_CAPSULE | Freq: Two times a day (BID) | ORAL | Status: DC
Start: 2015-07-23 — End: 2015-07-24
  Administered 2015-07-23 – 2015-07-24 (×3): 100 mg via ORAL

## 2015-07-23 MED ORDER — EPHEDRINE SULFATE 50 MG/ML IJ SOLN
INTRAMUSCULAR | Status: DC | PRN
  Administered 2015-07-23 (×4): 5 mg via INTRAVENOUS

## 2015-07-23 MED ORDER — SODIUM CHLORIDE 0.9 % IJ SOLN
INTRAMUSCULAR | Status: DC | PRN
  Administered 2015-07-23: 50 mL via INTRAVENOUS

## 2015-07-23 MED ORDER — METOCLOPRAMIDE HCL 10 MG PO TABS: 5.0000 mg | ORAL_TABLET | Freq: Three times a day (TID) | ORAL | Status: DC | PRN

## 2015-07-23 MED ORDER — LACTATED RINGERS IV SOLN: INTRAVENOUS | Status: DC

## 2015-07-23 MED ORDER — DEXAMETHASONE SODIUM PHOSPHATE 10 MG/ML IJ SOLN
10.0000 mg | Freq: Once | INTRAMUSCULAR | Status: AC
  Administered 2015-07-24: 10 mg via INTRAVENOUS
  Filled 2015-07-23: qty 1

## 2015-07-23 MED ORDER — HYDROMORPHONE HCL 1 MG/ML IJ SOLN: 0.2500 mg | INTRAMUSCULAR | Status: DC | PRN

## 2015-07-23 MED ORDER — ATORVASTATIN CALCIUM 10 MG PO TABS
10.0000 mg | ORAL_TABLET | Freq: Every day | ORAL | Status: DC
  Administered 2015-07-23 – 2015-07-24 (×2): 10 mg via ORAL
  Filled 2015-07-23 (×2): qty 1

## 2015-07-23 MED ORDER — METHOCARBAMOL 1000 MG/10ML IJ SOLN
500.0000 mg | Freq: Four times a day (QID) | INTRAVENOUS | Status: DC | PRN
  Administered 2015-07-23: 500 mg via INTRAVENOUS
  Filled 2015-07-23 (×2): qty 5

## 2015-07-23 MED ORDER — ACETAMINOPHEN 10 MG/ML IV SOLN
1000.0000 mg | Freq: Once | INTRAVENOUS | Status: AC
  Administered 2015-07-23: 1000 mg via INTRAVENOUS
  Filled 2015-07-23: qty 100

## 2015-07-23 MED ORDER — SODIUM CHLORIDE 0.9 % IJ SOLN
INTRAMUSCULAR | Status: AC
  Filled 2015-07-23: qty 50

## 2015-07-23 MED ORDER — APIXABAN 2.5 MG PO TABS
2.5000 mg | ORAL_TABLET | Freq: Two times a day (BID) | ORAL | Status: DC
  Administered 2015-07-24: 2.5 mg via ORAL
  Filled 2015-07-23 (×3): qty 1

## 2015-07-23 MED ORDER — SODIUM CHLORIDE 0.9 % IJ SOLN
INTRAMUSCULAR | Status: AC
  Filled 2015-07-23: qty 10

## 2015-07-23 MED ORDER — DEXAMETHASONE SODIUM PHOSPHATE 10 MG/ML IJ SOLN
INTRAMUSCULAR | Status: AC
  Filled 2015-07-23: qty 1

## 2015-07-23 MED ORDER — MENTHOL 3 MG MT LOZG: 1.0000 | LOZENGE | OROMUCOSAL | Status: DC | PRN

## 2015-07-23 MED ORDER — ACETAMINOPHEN 650 MG RE SUPP: 650.0000 mg | Freq: Four times a day (QID) | RECTAL | Status: DC | PRN

## 2015-07-23 MED ORDER — DEXAMETHASONE SODIUM PHOSPHATE 10 MG/ML IJ SOLN
10.0000 mg | Freq: Once | INTRAMUSCULAR | Status: AC
  Administered 2015-07-23: 10 mg via INTRAVENOUS

## 2015-07-23 MED ORDER — ONDANSETRON HCL 4 MG/2ML IJ SOLN
INTRAMUSCULAR | Status: DC | PRN
  Administered 2015-07-23: 4 mg via INTRAVENOUS

## 2015-07-23 MED ORDER — CEFAZOLIN SODIUM-DEXTROSE 2-3 GM-% IV SOLR
2.0000 g | Freq: Four times a day (QID) | INTRAVENOUS | Status: AC
  Administered 2015-07-23 (×2): 2 g via INTRAVENOUS
  Filled 2015-07-23 (×2): qty 50

## 2015-07-23 MED ORDER — PHENOL 1.4 % MT LIQD: 1.0000 | OROMUCOSAL | Status: DC | PRN

## 2015-07-23 MED ORDER — MIDAZOLAM HCL 5 MG/5ML IJ SOLN
INTRAMUSCULAR | Status: DC | PRN
  Administered 2015-07-23 (×2): 1 mg via INTRAVENOUS

## 2015-07-23 MED ORDER — FLEET ENEMA 7-19 GM/118ML RE ENEM: 1.0000 | ENEMA | Freq: Once | RECTAL | Status: DC | PRN

## 2015-07-23 MED ORDER — PROPOFOL 10 MG/ML IV BOLUS
INTRAVENOUS | Status: AC
Start: 2015-07-23 — End: 2015-07-23
  Filled 2015-07-23: qty 20

## 2015-07-23 MED ORDER — EPHEDRINE SULFATE 50 MG/ML IJ SOLN
INTRAMUSCULAR | Status: AC
  Filled 2015-07-23: qty 1

## 2015-07-23 MED ORDER — BUPIVACAINE IN DEXTROSE 0.75-8.25 % IT SOLN
INTRATHECAL | Status: DC | PRN
  Administered 2015-07-23: 1.8 mL via INTRATHECAL

## 2015-07-23 MED ORDER — POLYETHYLENE GLYCOL 3350 17 G PO PACK: 17.0000 g | PACK | Freq: Every day | ORAL | Status: DC | PRN

## 2015-07-23 MED ORDER — CHLORHEXIDINE GLUCONATE 4 % EX LIQD: 60.0000 mL | Freq: Once | CUTANEOUS | Status: DC

## 2015-07-23 MED ORDER — PROPOFOL 10 MG/ML IV BOLUS
INTRAVENOUS | Status: AC
  Filled 2015-07-23: qty 20

## 2015-07-23 MED ORDER — STERILE WATER FOR IRRIGATION IR SOLN
Status: DC | PRN
  Administered 2015-07-23: 1000 mL

## 2015-07-23 MED ORDER — MORPHINE SULFATE (PF) 2 MG/ML IV SOLN
1.0000 mg | INTRAVENOUS | Status: DC | PRN
  Administered 2015-07-23 (×2): 1 mg via INTRAVENOUS
  Filled 2015-07-23 (×2): qty 1

## 2015-07-23 MED ORDER — TRAMADOL HCL 50 MG PO TABS
50.0000 mg | ORAL_TABLET | Freq: Four times a day (QID) | ORAL | Status: DC | PRN
  Administered 2015-07-23 – 2015-07-24 (×2): 100 mg via ORAL
  Filled 2015-07-23 (×2): qty 2

## 2015-07-23 MED ORDER — BUPIVACAINE HCL (PF) 0.25 % IJ SOLN
INTRAMUSCULAR | Status: AC
  Filled 2015-07-23: qty 30

## 2015-07-23 MED ORDER — ONDANSETRON HCL 4 MG/2ML IJ SOLN: 4.0000 mg | Freq: Four times a day (QID) | INTRAMUSCULAR | Status: DC | PRN

## 2015-07-23 MED ORDER — ACETAMINOPHEN 500 MG PO TABS
1000.0000 mg | ORAL_TABLET | Freq: Four times a day (QID) | ORAL | Status: AC
  Administered 2015-07-23 – 2015-07-24 (×4): 1000 mg via ORAL
  Filled 2015-07-23 (×4): qty 2

## 2015-07-23 MED ORDER — SODIUM CHLORIDE 0.9 % IV SOLN
INTRAVENOUS | Status: DC
  Administered 2015-07-23: 23:00:00 via INTRAVENOUS

## 2015-07-23 MED ORDER — DILTIAZEM HCL ER BEADS 240 MG PO CP24
240.0000 mg | ORAL_CAPSULE | Freq: Every morning | ORAL | Status: DC
  Administered 2015-07-24: 240 mg via ORAL
  Filled 2015-07-23: qty 1

## 2015-07-23 MED ORDER — ONDANSETRON HCL 4 MG/2ML IJ SOLN
INTRAMUSCULAR | Status: AC
  Filled 2015-07-23: qty 2

## 2015-07-23 MED ORDER — TRANEXAMIC ACID 1000 MG/10ML IV SOLN
1000.0000 mg | INTRAVENOUS | Status: AC
  Administered 2015-07-23: 1000 mg via INTRAVENOUS
  Filled 2015-07-23: qty 10

## 2015-07-23 MED ORDER — BUPIVACAINE HCL 0.25 % IJ SOLN
INTRAMUSCULAR | Status: DC | PRN
  Administered 2015-07-23: 30 mL

## 2015-07-23 MED ORDER — 0.9 % SODIUM CHLORIDE (POUR BTL) OPTIME
TOPICAL | Status: DC | PRN
  Administered 2015-07-23: 1000 mL

## 2015-07-23 MED ORDER — ACETAMINOPHEN 325 MG PO TABS: 650.0000 mg | ORAL_TABLET | Freq: Four times a day (QID) | ORAL | Status: DC | PRN

## 2015-07-23 MED ORDER — SODIUM CHLORIDE 0.9 % IR SOLN
Status: DC | PRN
  Administered 2015-07-23: 1000 mL

## 2015-07-23 MED ORDER — FENTANYL CITRATE (PF) 100 MCG/2ML IJ SOLN
INTRAMUSCULAR | Status: DC | PRN
  Administered 2015-07-23 (×2): 50 ug via INTRAVENOUS

## 2015-07-23 MED ORDER — ACETAMINOPHEN 10 MG/ML IV SOLN
INTRAVENOUS | Status: AC
  Filled 2015-07-23: qty 100

## 2015-07-23 MED ORDER — DIPHENHYDRAMINE HCL 12.5 MG/5ML PO ELIX: 12.5000 mg | ORAL_SOLUTION | ORAL | Status: DC | PRN

## 2015-07-23 MED ORDER — LACTATED RINGERS IV SOLN
INTRAVENOUS | Status: DC | PRN
  Administered 2015-07-23: 07:00:00 via INTRAVENOUS

## 2015-07-23 MED ORDER — MIDAZOLAM HCL 2 MG/2ML IJ SOLN
INTRAMUSCULAR | Status: AC
  Filled 2015-07-23: qty 4

## 2015-07-23 MED ORDER — METHOCARBAMOL 500 MG PO TABS
500.0000 mg | ORAL_TABLET | Freq: Four times a day (QID) | ORAL | Status: DC | PRN
Start: 2015-07-23 — End: 2015-07-24

## 2015-07-23 MED ORDER — METOCLOPRAMIDE HCL 5 MG/ML IJ SOLN: 5.0000 mg | Freq: Three times a day (TID) | INTRAMUSCULAR | Status: DC | PRN

## 2015-07-23 MED ORDER — SODIUM CHLORIDE 0.9 % IV SOLN
INTRAVENOUS | Status: DC
  Administered 2015-07-23: 09:00:00 via INTRAVENOUS

## 2015-07-23 MED ORDER — ONDANSETRON HCL 4 MG PO TABS: 4.0000 mg | ORAL_TABLET | Freq: Four times a day (QID) | ORAL | Status: DC | PRN

## 2015-07-23 SURGICAL SUPPLY — 48 items
BAG DECANTER FOR FLEXI CONT (MISCELLANEOUS) ×2 IMPLANT
BAG ZIPLOCK 12X15 (MISCELLANEOUS) ×2 IMPLANT
BANDAGE ELASTIC 6 VELCRO ST LF (GAUZE/BANDAGES/DRESSINGS) ×2 IMPLANT
BLADE SAG 18X100X1.27 (BLADE) ×2 IMPLANT
BLADE SAW SGTL 11.0X1.19X90.0M (BLADE) ×2 IMPLANT
BOWL SMART MIX CTS (DISPOSABLE) ×2 IMPLANT
CAP KNEE TOTAL 3 SIGMA ×2 IMPLANT
CEMENT HV SMART SET (Cement) ×4 IMPLANT
CLOTH BEACON ORANGE TIMEOUT ST (SAFETY) ×2 IMPLANT
CUFF TOURN SGL QUICK 34 (TOURNIQUET CUFF) ×1
CUFF TRNQT CYL 34X4X40X1 (TOURNIQUET CUFF) ×1 IMPLANT
DECANTER SPIKE VIAL GLASS SM (MISCELLANEOUS) ×2 IMPLANT
DRAPE U-SHAPE 47X51 STRL (DRAPES) ×2 IMPLANT
DRSG ADAPTIC 3X8 NADH LF (GAUZE/BANDAGES/DRESSINGS) ×2 IMPLANT
DRSG PAD ABDOMINAL 8X10 ST (GAUZE/BANDAGES/DRESSINGS) ×2 IMPLANT
DURAPREP 26ML APPLICATOR (WOUND CARE) ×2 IMPLANT
ELECT REM PT RETURN 9FT ADLT (ELECTROSURGICAL)
ELECTRODE REM PT RTRN 9FT ADLT (ELECTROSURGICAL) IMPLANT
EVACUATOR 1/8 PVC DRAIN (DRAIN) ×2 IMPLANT
FEMUR SIGMA PS SZ 5.0 L (Femur) IMPLANT
GAUZE SPONGE 4X4 12PLY STRL (GAUZE/BANDAGES/DRESSINGS) ×2 IMPLANT
GLOVE BIO SURGEON STRL SZ7.5 (GLOVE) IMPLANT
GLOVE BIO SURGEON STRL SZ8 (GLOVE) ×2 IMPLANT
GLOVE BIOGEL PI IND STRL 6.5 (GLOVE) ×1 IMPLANT
GLOVE BIOGEL PI IND STRL 8 (GLOVE) ×1 IMPLANT
GLOVE BIOGEL PI INDICATOR 6.5 (GLOVE) ×1
GLOVE BIOGEL PI INDICATOR 8 (GLOVE) ×1
GLOVE SURG SS PI 6.5 STRL IVOR (GLOVE) ×2 IMPLANT
GOWN STRL REUS W/TWL LRG LVL3 (GOWN DISPOSABLE) ×2 IMPLANT
GOWN STRL REUS W/TWL XL LVL3 (GOWN DISPOSABLE) IMPLANT
HANDPIECE INTERPULSE COAX TIP (DISPOSABLE) ×1
IMMOBILIZER KNEE 20 (SOFTGOODS) ×2
IMMOBILIZER KNEE 20 THIGH 36 (SOFTGOODS) ×1 IMPLANT
MANIFOLD NEPTUNE II (INSTRUMENTS) IMPLANT
NS IRRIG 1000ML POUR BTL (IV SOLUTION) IMPLANT
PACK TOTAL KNEE CUSTOM (KITS) ×2 IMPLANT
PADDING CAST COTTON 6X4 STRL (CAST SUPPLIES) ×4 IMPLANT
POSITIONER SURGICAL ARM (MISCELLANEOUS) ×2 IMPLANT
SET HNDPC FAN SPRY TIP SCT (DISPOSABLE) ×1 IMPLANT
STRIP CLOSURE SKIN 1/2X4 (GAUZE/BANDAGES/DRESSINGS) ×4 IMPLANT
SUT MNCRL AB 4-0 PS2 18 (SUTURE) ×2 IMPLANT
SUT VIC AB 2-0 CT1 27 (SUTURE) ×3
SUT VIC AB 2-0 CT1 TAPERPNT 27 (SUTURE) ×3 IMPLANT
SUT VLOC 180 0 24IN GS25 (SUTURE) ×2 IMPLANT
SYR 50ML LL SCALE MARK (SYRINGE) ×2 IMPLANT
TRAY FOLEY W/METER SILVER 16FR (SET/KITS/TRAYS/PACK) IMPLANT
WRAP KNEE MAXI GEL POST OP (GAUZE/BANDAGES/DRESSINGS) IMPLANT
YANKAUER SUCT BULB TIP 10FT TU (MISCELLANEOUS) ×2 IMPLANT

## 2015-07-23 NOTE — Evaluation (Signed)
Physical Therapy Evaluation Patient Details Name: [name] Goodman MRN: [id number] DOB: 03/27/1941 Today's Date: 07/23/2015   History of Present Illness  74 yo male s/p L TKA 07/23/15.   Clinical Impression  On eval POD 0, pt required Min assist for mobility-walked ~75 feet with RW. Pain rated 5/10. Pt tolerated activity well.     Follow Up Recommendations Home health PT    Equipment Recommendations  Rolling walker with 5" wheels    Recommendations for Other Services       Precautions / Restrictions Precautions Precautions: Fall;Knee Required Braces or Orthoses: Knee Immobilizer - Left Knee Immobilizer - Left: Discontinue once straight leg raise with < 10 degree lag Restrictions Weight Bearing Restrictions: No LLE Weight Bearing: Weight bearing as tolerated      Mobility  Bed Mobility Overal bed mobility: Needs Assistance Bed Mobility: Supine to Sit     Supine to sit: Min assist     General bed mobility comments: Assist for L LE  Transfers Overall transfer level: Needs assistance Equipment used: Rolling walker (2 wheeled) Transfers: Sit to/from Stand Sit to Stand: Min assist;From elevated surface         General transfer comment: Assist to rise, stabilize, control descent. VCs safety, technique, hand/LE placement  Ambulation/Gait Ambulation/Gait assistance: Min assist Ambulation Distance (Feet): 75 Feet Assistive device: Rolling walker (2 wheeled) Gait Pattern/deviations: Step-to pattern;Trunk flexed;Antalgic;Decreased stance time - left     General Gait Details: VCs safety, technique, sequence. assist to stabilize intermittently.   Stairs            Wheelchair Mobility    Modified Rankin (Stroke Patients Only)       Balance                                             Pertinent Vitals/Pain Pain Assessment: 0-10 Pain Score: 5  Pain Location: L knee Pain Descriptors / Indicators: Aching;Sore Pain Intervention(s):  Monitored during session;Ice applied;Repositioned    Home Living Family/patient expects to be discharged to:: Private residence Living Arrangements: Spouse/significant other   Type of Home: House Home Access: Stairs to enter Entrance Stairs-Rails: None Secretary/administratorntrance Stairs-Number of Steps: 2 Home Layout: One level Home Equipment: None      Prior Function Level of Independence: Independent               Hand Dominance        Extremity/Trunk Assessment   Upper Extremity Assessment: Defer to OT evaluation           Lower Extremity Assessment: LLE deficits/detail   LLE Deficits / Details: hip flex 2/5, hip abd/add 2/5,moves ankle well  Cervical / Trunk Assessment: Normal  Communication   Communication: No difficulties  Cognition Arousal/Alertness: Awake/alert Behavior During Therapy: WFL for tasks assessed/performed Overall Cognitive Status: Within Functional Limits for tasks assessed                      General Comments      Exercises        Assessment/Plan    PT Assessment Patient needs continued PT services  PT Diagnosis Difficulty walking;Abnormality of gait;Acute pain   PT Problem List Decreased strength;Decreased range of motion;Decreased activity tolerance;Decreased balance;Decreased mobility;Decreased knowledge of use of DME;Pain  PT Treatment Interventions DME instruction;Gait training;Functional mobility training;Therapeutic activities;Patient/family education;Balance training;Therapeutic exercise;Stair training   PT Goals (Current  goals can be found in the Care Plan section) Acute Rehab PT Goals Patient Stated Goal: walk without limp PT Goal Formulation: With patient Time For Goal Achievement: 07/30/15 Potential to Achieve Goals: Good    Frequency 7X/week   Barriers to discharge        Co-evaluation               End of Session Equipment Utilized During Treatment: Gait belt;Left knee immobilizer Activity Tolerance:  Patient tolerated treatment well Patient left: in chair;with call bell/phone within reach;with family/visitor present           Time: 1510-1543 PT Time Calculation (min) (ACUTE ONLY): 33 min   Charges:   PT Evaluation $Initial PT Evaluation Tier I: 1 Procedure PT Treatments $Gait Training: 8-22 mins   PT G Codes:        Rebeca Alert, MPT Pager: [phone]

## 2015-07-23 NOTE — Anesthesia Procedure Notes (Signed)
Spinal Patient location during procedure: OR End time: 07/23/2015 7:07 AM Staffing Anesthesiologist: Rod Mae Resident/CRNA: Maxwell Caul Performed by: resident/CRNA  Preanesthetic Checklist Completed: patient identified, site marked, surgical consent, pre-op evaluation, timeout performed, IV checked, risks and benefits discussed and monitors and equipment checked Spinal Block Patient position: sitting Prep: Betadine Patient monitoring: heart rate, continuous pulse ox and blood pressure Injection technique: single-shot Needle Needle type: Sprotte  Needle gauge: 24 G Needle length: 9 cm Additional Notes Expiration date of kit checked and confirmed. Patient tolerated procedure well, without complications. Expiration date: 11-19-2016. Lot # [id number].

## 2015-07-23 NOTE — Anesthesia Preprocedure Evaluation (Addendum)
Anesthesia Evaluation  Patient identified by MRN, date of birth, ID band Patient awake    Reviewed: Allergy & Precautions, H&P , NPO status , Patient's Chart, lab work & pertinent test results  Airway Mallampati: II  TM Distance: >3 FB Neck ROM: full    Dental  (+) Dental Advisory Given, Caps 2 upper front capped and a few other front teeth as well:   Pulmonary neg pulmonary ROS, former smoker,    Pulmonary exam normal breath sounds clear to auscultation       Cardiovascular Exercise Tolerance: Good hypertension, Pt. on medications Normal cardiovascular exam Rhythm:regular Rate:Normal     Neuro/Psych negative neurological ROS  negative psych ROS   GI/Hepatic negative GI ROS, Neg liver ROS,   Endo/Other  negative endocrine ROS  Renal/GU negative Renal ROS  negative genitourinary   Musculoskeletal   Abdominal   Peds  Hematology negative hematology ROS (+)   Anesthesia Other Findings   Reproductive/Obstetrics negative OB ROS                            Anesthesia Physical Anesthesia Plan  ASA: II  Anesthesia Plan: Spinal   Post-op Pain Management:    Induction:   Airway Management Planned:   Additional Equipment:   Intra-op Plan:   Post-operative Plan:   Informed Consent: I have reviewed the patients History and Physical, chart, labs and discussed the procedure including the risks, benefits and alternatives for the proposed anesthesia with the patient or authorized representative who has indicated his/her understanding and acceptance.   Dental Advisory Given  Plan Discussed with: CRNA and Surgeon  Anesthesia Plan Comments:         Anesthesia Quick Evaluation

## 2015-07-23 NOTE — Op Note (Signed)
Pre-operative diagnosis- Osteoarthritis  Left knee(s)  Post-operative diagnosis- Osteoarthritis Left knee(s)  Procedure-  Left  Total Knee Arthroplasty  Surgeon- Gus Rankin. Asiah Browder, MD  Assistant- Dimitri Ped, PA-C   Anesthesia-  Spinal  EBL-* No blood loss amount entered *   Drains Hemovac  Tourniquet time-  Total Tourniquet Time Documented: Thigh (Left) - 36 minutes Total: Thigh (Left) - 36 minutes     Complications- None  Condition-PACU - hemodynamically stable.   Brief Clinical Note   [name] Goodman is a 74 y.o. year old male with end stage OA of his left knee with progressively worsening pain and dysfunction. He has constant pain, with activity and at rest and significant functional deficits with difficulties even with ADLs. He has had extensive non-op management including analgesics, injections of cortisone, and home exercise program, but remains in significant pain with significant dysfunction. Radiographs show bone on bone arthritis medial and patellofemoral. He presents now for left Total Knee Arthroplasty.     Procedure in detail---   The patient is brought into the operating room and positioned supine on the operating table. After successful administration of  Spinal,   a tourniquet is placed high on the  Left thigh(s) and the lower extremity is prepped and draped in the usual sterile fashion. Time out is performed by the operating team and then the  Left lower extremity is wrapped in Esmarch, knee flexed and the tourniquet inflated to 300 mmHg.       A midline incision is made with a ten blade through the subcutaneous tissue to the level of the extensor mechanism. A fresh blade is used to make a medial parapatellar arthrotomy. Soft tissue over the proximal medial tibia is subperiosteally elevated to the joint line with a knife and into the semimembranosus bursa with a Cobb elevator. Soft tissue over the proximal lateral tibia is elevated with attention being paid  to avoiding the patellar tendon on the tibial tubercle. The patella is everted, knee flexed 90 degrees and the ACL and PCL are removed. Findings are bone on bone medial and patellofemoral with large global osteophytes.        The drill is used to create a starting hole in the distal femur and the canal is thoroughly irrigated with sterile saline to remove the fatty contents. The 5 degree Left  valgus alignment guide is placed into the femoral canal and the distal femoral cutting block is pinned to remove 10 mm off the distal femur. Resection is made with an oscillating saw.      The tibia is subluxed forward and the menisci are removed. The extramedullary alignment guide is placed referencing proximally at the medial aspect of the tibial tubercle and distally along the second metatarsal axis and tibial crest. The block is pinned to remove 2mm off the more deficient medial  side. Resection is made with an oscillating saw. Size 5is the most appropriate size for the tibia and the proximal tibia is prepared with the modular drill and keel punch for that size.      The femoral sizing guide is placed and size 4 is most appropriate. Rotation is marked off the epicondylar axis and confirmed by creating a rectangular flexion gap at 90 degrees. The size 4 cutting block is pinned in this rotation and the anterior, posterior and chamfer cuts are made with the oscillating saw. The intercondylar block is then placed and that cut is made.      Trial size 5 tibial component,  trial size 4 posterior stabilized femur and a 12.5  mm posterior stabilized rotating platform insert trial is placed. Full extension is achieved with excellent varus/valgus and anterior/posterior balance throughout full range of motion. The patella is everted and thickness measured to be 27  mm. Free hand resection is taken to 15 mm, a 41 template is placed, lug holes are drilled, trial patella is placed, and it tracks normally. Osteophytes are removed off  the posterior femur with the trial in place. All trials are removed and the cut bone surfaces prepared with pulsatile lavage. Cement is mixed and once ready for implantation, the size 5 tibial implant, size  4 posterior stabilized femoral component, and the size 41 patella are cemented in place and the patella is held with the clamp. The trial insert is placed and the knee held in full extension. The Exparel (20 ml mixed with 30 ml saline) and .25% Bupivicaine, are injected into the extensor mechanism, posterior capsule, medial and lateral gutters and subcutaneous tissues.  All extruded cement is removed and once the cement is hard the permanent 12.5 mm posterior stabilized rotating platform insert is placed into the tibial tray.      The wound is copiously irrigated with saline solution and the extensor mechanism closed over a hemovac drain with #1 V-loc suture. The tourniquet is released for a total tourniquet time of 36  minutes. Flexion against gravity is 140 degrees and the patella tracks normally. Subcutaneous tissue is closed with 2.0 vicryl and subcuticular with running 4.0 Monocryl. The incision is cleaned and dried and steri-strips and a bulky sterile dressing are applied. The limb is placed into a knee immobilizer and the patient is awakened and transported to recovery in stable condition.      Please note that a surgical assistant was a medical necessity for this procedure in order to perform it in a safe and expeditious manner. Surgical assistant was necessary to retract the ligaments and vital neurovascular structures to prevent injury to them and also necessary for proper positioning of the limb to allow for anatomic placement of the prosthesis.   Gus RankinFrank V. Dorathea Faerber, MD    07/23/2015, 8:06 AM

## 2015-07-23 NOTE — Anesthesia Postprocedure Evaluation (Signed)
  Anesthesia Post-op Note  Patient: [name] Goodman  Procedure(s) Performed: Procedure(s) (LRB): LEFT TOTAL KNEE ARTHROPLASTY (Left)  Patient Location: PACU  Anesthesia Type: Spinal  Level of Consciousness: awake and alert   Airway and Oxygen Therapy: Patient Spontanous Breathing  Post-op Pain: mild  Post-op Assessment: Post-op Vital signs reviewed, Patient's Cardiovascular Status Stable, Respiratory Function Stable, Patent Airway and No signs of Nausea or vomiting  Last Vitals:  Filed Vitals:   07/23/15 0953  BP: 128/69  Pulse: 51  Temp: 36.4 C  Resp: 14    Post-op Vital Signs: stable   Complications: No apparent anesthesia complications

## 2015-07-23 NOTE — Discharge Instructions (Addendum)

## 2015-07-23 NOTE — Transfer of Care (Signed)
Immediate Anesthesia Transfer of Care Note  Patient: [name] Goodman  Procedure(s) Performed: Procedure(s): LEFT TOTAL KNEE ARTHROPLASTY (Left)  Patient Location: PACU  Anesthesia Type:Spinal  Level of Consciousness:  sedated, patient cooperative and responds to stimulation  Airway & Oxygen Therapy:Patient Spontanous Breathing and Patient connected to face mask oxgen  Post-op Assessment:  Report given to PACU RN and Post -op Vital signs reviewed and stable  Post vital signs:  Reviewed and stable  Last Vitals:  Filed Vitals:   07/23/15 0507  BP: 136/79  Pulse: 63  Temp: 36.4 C  Resp: 18    Complications: No apparent anesthesia complications

## 2015-07-24 LAB — BASIC METABOLIC PANEL
Anion gap: 7 (ref 5–15)
BUN: 25 mg/dL — ABNORMAL HIGH (ref 6–20)
CALCIUM: 8.9 mg/dL (ref 8.9–10.3)
CO2: 27 mmol/L (ref 22–32)
CREATININE: 1.08 mg/dL (ref 0.61–1.24)
Chloride: 104 mmol/L (ref 101–111)
GFR calc non Af Amer: >60 mL/min (ref >60)
Glucose, Bld: 160 mg/dL — ABNORMAL HIGH (ref 65–99)
Potassium: 3.8 mmol/L (ref 3.5–5.1)
SODIUM: 138 mmol/L (ref 135–145)

## 2015-07-24 LAB — CBC
HCT: 39.9 % (ref 39.0–52.0)
Hemoglobin: 13.4 g/dL (ref 13.0–17.0)
MCH: 30.4 pg (ref 26.0–34.0)
MCHC: 33.6 g/dL (ref 30.0–36.0)
MCV: 90.5 fL (ref 78.0–100.0)
Platelets: 161 10*3/uL (ref 150–400)
RBC: 4.41 MIL/uL (ref 4.22–5.81)
RDW: 13.3 % (ref 11.5–15.5)
WBC: 12.7 10*3/uL — ABNORMAL HIGH (ref 4.0–10.5)

## 2015-07-24 MED ORDER — OXYCODONE HCL 5 MG PO TABS: 5.0000 mg | ORAL_TABLET | ORAL | Status: DC | PRN

## 2015-07-24 MED ORDER — TRAMADOL HCL 50 MG PO TABS: 50.0000 mg | ORAL_TABLET | Freq: Four times a day (QID) | ORAL | Status: DC | PRN

## 2015-07-24 MED ORDER — METHOCARBAMOL 500 MG PO TABS: 500.0000 mg | ORAL_TABLET | Freq: Four times a day (QID) | ORAL | Status: DC | PRN

## 2015-07-24 MED ORDER — APIXABAN 2.5 MG PO TABS: 2.5000 mg | ORAL_TABLET | Freq: Two times a day (BID) | ORAL | Status: DC

## 2015-07-24 NOTE — Progress Notes (Signed)
Patient wants to wait until PT walks with him before we remove foley cath.  [name] Goodman , RN.

## 2015-07-24 NOTE — Care Management Note (Addendum)
Case Management Note  Patient Details  Name: SHAINE NEWMARK MRN: [id number] Date of Birth: 06-14-1941  Subjective/Objective:                   FT TOTAL KNEE ARTHROPLASTY (Left) Action/Plan: Discharge planning  Expected Discharge Date:  07/24/15               Expected Discharge Plan:  [place]  In-House Referral:     Discharge planning Services  CM Consult  Post Acute Care Choice:  Home Health Choice offered to:  Patient  DME Arranged:  3-N-1, Walker rolling DME Agency:  [place]:  PT [place] Agency:  [facility]  Status of Service:  Completed, signed off  Medicare Important Message Given:    Date Medicare IM Given:    Medicare IM give by:    Date Additional Medicare IM Given:    Additional Medicare Important Message give by:     If discussed at [place] of Stay Meetings, dates discussed:    Additional Comments: Utilization Review complete.  CM met with pt in room to offer choice of home health agency. Pt chooses [facility] to render HHPT.  Referral given to [employer], Tim.  Cm called [facility] DME rep, Lecretia to please deliver the rolling walker and 3n1 to room prior to discharge.  No other Cm needs communicated. Dellie Catholic, RN 07/24/2015, 10:47 AM

## 2015-07-24 NOTE — Progress Notes (Addendum)
Physical Therapy Treatment Patient Details Name: [name] Goodman MRN: [id number] DOB: 04/07/1941 Today's Date: 07/24/2015    History of Present Illness 74 yo male s/p L TKA 07/23/15.     PT Comments    Progressing with mobility. Will plan to have 2nd session prior to d/c.  Follow Up Recommendations  Home health PT     Equipment Recommendations  Rolling walker with 5" wheels    Recommendations for Other Services       Precautions / Restrictions Precautions Precautions: Knee;Fall Required Braces or Orthoses: Knee Immobilizer - Left Knee Immobilizer - Left: Discontinue once straight leg raise with < 10 degree lag Restrictions Weight Bearing Restrictions: No LLE Weight Bearing: Weight bearing as tolerated    Mobility  Bed Mobility Overal bed mobility: Modified Independent Bed Mobility: Supine to Sit     Supine to sit: Modified independent (Device/Increase time)     General bed mobility comments: oob in chair  Transfers Overall transfer level: Needs assistance Equipment used: Rolling walker (2 wheeled) Transfers: Sit to/from Stand Sit to Stand: Min guard Stand pivot transfers: Supervision       General transfer comment: close guard for safety. VCs safety, technique,hand placement  Ambulation/Gait Ambulation/Gait assistance: Min assist Ambulation Distance (Feet): 115 Feet Assistive device: Rolling walker (2 wheeled) Gait Pattern/deviations: Step-to pattern     General Gait Details: VCs safety, technique, sequence, pacing. assist to stabilize intermittently.    Stairs  5 steps; Min assist; 1 hand on wall, HHA on opposite side. Practiced up and over portable steps x 2 (once with therapist, once with wife). VCs safety, sequence, technique.           Wheelchair Mobility    Modified Rankin (Stroke Patients Only)       Balance Overall balance assessment: No apparent balance deficits (not formally assessed)                                  Cognition Arousal/Alertness: Awake/alert Behavior During Therapy: WFL for tasks assessed/performed Overall Cognitive Status: Within Functional Limits for tasks assessed                      Exercises Total Joint Exercises Ankle Circles/Pumps: AROM;Both;10 reps;Supine Quad Sets: AROM;Both;10 reps;Supine Hip ABduction/ADduction: Left;10 reps;Supine;AROM Straight Leg Raises: Left;10 reps;AROM;Supine Knee Flexion: AAROM;Left;10 reps;Seated Goniometric ROM: ~10-50 degrees    General Comments        Pertinent Vitals/Pain Pain Assessment: 0-10 Pain Score: 2  Pain Location: L knee Pain Descriptors / Indicators: Aching;Sore Pain Intervention(s): Monitored during session;Ice applied;Repositioned    Home Living Family/patient expects to be discharged to:: Private residence Living Arrangements: Spouse/significant other Available Help at Discharge: Family Type of Home: House Home Access: Stairs to enter Entrance Stairs-Rails: None Home Layout: One level Home Equipment: None      Prior Function Level of Independence: Independent          PT Goals (current goals can now be found in the care plan section) Acute Rehab PT Goals Patient Stated Goal: Independent walking Progress towards PT goals: Progressing toward goals    Frequency  7X/week    PT Plan Current plan remains appropriate    Co-evaluation             End of Session Equipment Utilized During Treatment: Gait belt;Left knee immobilizer Activity Tolerance: Patient tolerated treatment well Patient left: in chair;with call bell/phone within reach;with family/visitor  present     Time: 1036-1105 PT Time Calculation (min) (ACUTE ONLY): 29 min  Charges:  $Gait Training: 8-22 mins $Therapeutic Exercise: 8-22 mins                    G Codes:      Rebeca Alert, MPT Pager: [phone]

## 2015-07-24 NOTE — Evaluation (Signed)
Occupational Therapy Evaluation Patient Details Name: [name] Goodman MRN: [id number] DOB: Jul 28, 1941 Today's Date: 07/24/2015    History of Present Illness 74 yo male s/p L TKA 07/23/15.    Clinical Impression   Pt is a 74 y/o male admitted as above, he participated in ADL retraining session to include toileting, simulated grooming standing at sink and review of 3:1 use over toilet at home to assist with increased height. Discussed a/e use and pt denies need at this time as his wife will assist PRN for LB ADL's. Pt expresses no further acute OT needs at this time, will sign off.    Follow Up Recommendations  No OT follow up;Supervision - Intermittent    Equipment Recommendations  3 in 1 bedside comode    Recommendations for Other Services       Precautions / Restrictions Precautions Precautions: Fall;Knee Required Braces or Orthoses: Knee Immobilizer - Left Knee Immobilizer - Left: Discontinue once straight leg raise with < 10 degree lag Restrictions Weight Bearing Restrictions: No LLE Weight Bearing: Weight bearing as tolerated      Mobility Bed Mobility Overal bed mobility: Modified Independent Bed Mobility: Supine to Sit     Supine to sit: Modified independent (Device/Increase time)     General bed mobility comments: Pt was able to lift LLE with assist from RLE under his ankle, hands off - no physical assistance required.  Transfers Overall transfer level: Needs assistance Equipment used: Rolling walker (2 wheeled) Transfers: Sit to/from [employer] Sit to Stand: Supervision Stand pivot transfers: Supervision       General transfer comment: No physical assistance - 1 VC for technique    Balance Overall balance assessment: No apparent balance deficits (not formally assessed)                                          ADL Overall ADL's : Needs assistance/impaired     Grooming: Wash/dry hands;Wash/dry  face;Standing;Supervision/safety   Upper Body Bathing: Sitting;Modified independent   Lower Body Bathing: Min guard;Sit to/from stand   Upper Body Dressing : Modified independent;Sitting   Lower Body Dressing: Min guard;Sit to/from stand;With caregiver independent assisting       Toileting- Clothing Manipulation and Hygiene: Supervision/safety;Sit to/from stand       Functional mobility during ADLs: Supervision/safety;Rolling walker General ADL Comments: Pt/spouse educated in Role of OT, followed by assessment. Pt participated in ADL retraining session to include toileting, simulated grooming standing at sink and review of 3:1 use over toilet at home to assist with increased height. Discussed a/e use and pt denies need at this time as his wife will assist PRN for LB ADL's. Pt expresses no further acute OT needs at this time, will sign off.     Vision     Perception     Praxis      Pertinent Vitals/Pain Pain Assessment: 0-10 Pain Score: 0-No pain Pain Location: L knee Pain Intervention(s): Monitored during session     Hand Dominance Right   Extremity/Trunk Assessment Upper Extremity Assessment Upper Extremity Assessment: Overall WFL for tasks assessed   Lower Extremity Assessment Lower Extremity Assessment: Defer to PT evaluation       Communication Communication Communication: No difficulties   Cognition Arousal/Alertness: Awake/alert Behavior During Therapy: WFL for tasks assessed/performed Overall Cognitive Status: Within Functional Limits for tasks assessed  General Comments       Exercises       Shoulder Instructions      Home Living Family/patient expects to be discharged to:: Private residence Living Arrangements: Spouse/significant other Available Help at Discharge: Family Type of Home: House Home Access: Stairs to enter Secretary/administratorntrance Stairs-Number of Steps: 2 Entrance Stairs-Rails: None Home Layout: One level      Bathroom Shower/Tub: Tub/shower unit Shower/tub characteristics: Engineer, building servicesCurtain Bathroom Toilet: Handicapped height     Home Equipment: None          Prior Functioning/Environment Level of Independence: Independent             OT Diagnosis: Acute pain   OT Problem List: Pain   OT Treatment/Interventions:      OT Goals(Current goals can be found in the care plan section) Acute Rehab OT Goals Patient Stated Goal: Independent walking  OT Frequency:     Barriers to D/C:            Co-evaluation              End of Session Equipment Utilized During Treatment: Gait belt;Rolling walker;Left knee immobilizer  Activity Tolerance: Patient tolerated treatment well Patient left: in chair;with call bell/phone within reach;with chair alarm set;with family/visitor present   Time: 6045-40980945-1012 OT Time Calculation (min): 27 min Charges:  OT General Charges $OT Visit: 1 Procedure OT Evaluation $Initial OT Evaluation Tier I: 1 Procedure OT Treatments $Self Care/Home Management : 8-22 mins G-Codes:    Roselie AwkwardBarnhill, Weslie Rasmus Beth Dixon, OTR/L 07/24/2015, 10:21 AM

## 2015-07-24 NOTE — Progress Notes (Signed)
Physical Therapy Treatment Patient Details Name: [name] Goodman MRN: [id number] DOB: 01/24/1941 Today's Date: 07/24/2015    History of Present Illness 74 yo male s/p L TKA 07/23/15.     PT Comments    2nd session to practice ambulation and review stair negotiation technique. Discussed car transfer-demonstrated and informed pt that he could sit in front seat but KI would likely need to be removed. Reviewed donning/doffing of KI. Pt stated he was planning to ride in back seat. All education completed. No further questions from pt/wife  Follow Up Recommendations  Home health PT; 24 hour supervision/assist     Equipment Recommendations  Rolling walker with 5" wheels    Recommendations for Other Services       Precautions / Restrictions Precautions Precautions: Fall;Knee Required Braces or Orthoses: Knee Immobilizer - Left Knee Immobilizer - Left: Discontinue once straight leg raise with < 10 degree lag Restrictions Weight Bearing Restrictions: No LLE Weight Bearing: Weight bearing as tolerated    Mobility  Bed Mobility               General bed mobility comments: oob in chair  Transfers Overall transfer level: Needs assistance Equipment used: Rolling walker (2 wheeled) Transfers: Sit to/from Stand Sit to Stand: Min guard         General transfer comment: close guard for safety. VCs safety, technique,hand placement  Ambulation/Gait Ambulation/Gait assistance: Min guard Ambulation Distance (Feet): 125 Feet Assistive device: Rolling walker (2 wheeled) Gait Pattern/deviations: Step-to pattern     General Gait Details: VCs safety, technique, sequence, pacing. assist to stabilize intermittently.    Stairs Stairs:  (Verbally reviewed sequence, technique with pt and wife)          Wheelchair Mobility    Modified Rankin (Stroke Patients Only)       Balance                                    Cognition Arousal/Alertness:  Awake/Goodman Behavior During Therapy: WFL for tasks assessed/performed Overall Cognitive Status: Within Functional Limits for tasks assessed                      Exercises     General Comments        Pertinent Vitals/Pain Pain Assessment: 0-10 Pain Score: 5  Pain Location: L thigh Pain Descriptors / Indicators: Sore Pain Intervention(s): Monitored during session    Home Living                      Prior Function            PT Goals (current goals can now be found in the care plan section) Progress towards PT goals: Progressing toward goals    Frequency  7X/week    PT Plan Current plan remains appropriate    Co-evaluation             End of Session Equipment Utilized During Treatment: Gait belt;Left knee immobilizer Activity Tolerance: Patient tolerated treatment well Patient left: in chair;with call bell/phone within reach;with family/visitor present     Time: 0272-53661346-1402 PT Time Calculation (min) (ACUTE ONLY): 16 min  Charges:  $Gait Training: 8-22 mins $Therapeutic Exercise: 8-22 mins                    G Codes:      [name] AlertJannie Broox Goodman, MPT Pager: [phone]

## 2015-07-24 NOTE — Discharge Summary (Signed)
Physician Discharge Summary   Patient ID: [name] Goodman MRN: [id number] DOB/AGE: 1941-04-08 74 y.o.  Admit date: 07/23/2015 Discharge date: 07-24-2015  Primary Diagnosis:  Osteoarthritis Left knee(s) Admission Diagnoses:  Past Medical History  Diagnosis Date  . Hypertension   . Arthritis   . History of kidney stones   . Nocturia    Discharge Diagnoses:   Principal Problem:   OA (osteoarthritis) of knee  Estimated body mass index is 28.24 kg/(m^2) as calculated from the following:   Height as of this encounter: _0  (1.854 m).   Weight as of this encounter: 97.07 kg (214 lb).  Procedure:  Procedure(s) (LRB): LEFT TOTAL KNEE ARTHROPLASTY (Left)   Consults: None  HPI: [name] Goodman is a 74 y.o. year old male with end stage OA of his left knee with progressively worsening pain and dysfunction. He has constant pain, with activity and at rest and significant functional deficits with difficulties even with ADLs. He has had extensive non-op management including analgesics, injections of cortisone, and home exercise program, but remains in significant pain with significant dysfunction. Radiographs show bone on bone arthritis medial and patellofemoral. He presents now for left Total Knee Arthroplasty.   Laboratory Data: Admission on 07/23/2015  Component Date Value Ref Range Status  . WBC 07/24/2015 12.7* 4.0 - 10.5 K/uL Final  . RBC 07/24/2015 4.41  4.22 - 5.81 MIL/uL Final  . Hemoglobin 07/24/2015 13.4  13.0 - 17.0 g/dL Final  . HCT 07/24/2015 39.9  39.0 - 52.0 % Final  . MCV 07/24/2015 90.5  78.0 - 100.0 fL Final  . MCH 07/24/2015 30.4  26.0 - 34.0 pg Final  . MCHC 07/24/2015 33.6  30.0 - 36.0 g/dL Final  . RDW 07/24/2015 13.3  11.5 - 15.5 % Final  . Platelets 07/24/2015 161  150 - 400 K/uL Final  . Sodium 07/24/2015 138  135 - 145 mmol/L Final  . Potassium 07/24/2015 3.8  3.5 - 5.1 mmol/L Final  . Chloride 07/24/2015 104  101 - 111 mmol/L Final  . CO2  07/24/2015 27  22 - 32 mmol/L Final  . Glucose, Bld 07/24/2015 160* 65 - 99 mg/dL Final  . BUN 07/24/2015 25* 6 - 20 mg/dL Final  . Creatinine, Ser 07/24/2015 1.08  0.61 - 1.24 mg/dL Final  . Calcium 07/24/2015 8.9  8.9 - 10.3 mg/dL Final  . GFR calc non Af Amer 07/24/2015 >60  >60 mL/min Final  . GFR calc Af Amer 07/24/2015 >60  >60 mL/min Final   Comment: (NOTE) The eGFR has been calculated using the CKD EPI equation. This calculation has not been validated in all clinical situations. eGFR's persistently <60 mL/min signify possible Chronic Kidney Disease.   Georgiann Hahn gap 07/24/2015 7  5 - 15 Final  Hospital Outpatient Visit on 07/17/2015  Component Date Value Ref Range Status  . aPTT 07/17/2015 31  24 - 37 seconds Final  . Prothrombin Time 07/17/2015 14.0  11.6 - 15.2 seconds Final  . INR 07/17/2015 1.06  0.00 - 1.49 Final  . ABO/RH(D) 07/17/2015 O POS   Final  . Antibody Screen 07/17/2015 NEG   Final  . Sample Expiration 07/17/2015 07/26/2015   Final  . Extend sample reason 07/17/2015 NO TRANSFUSIONS OR PREGNANCY IN THE PAST 3 MONTHS   Final  . MRSA, PCR 07/17/2015 NEGATIVE  NEGATIVE Final  . Staphylococcus aureus 07/17/2015 NEGATIVE  NEGATIVE Final   Comment:        The Xpert SA Assay (FDA  approved for NASAL specimens in patients over 65 years of age), is one component of a comprehensive surveillance program.  Test performance has been validated by [facility] for patients greater than or equal to 69 year old. It is not intended to diagnose infection nor to guide or monitor treatment.   . ABO/RH(D) 07/17/2015 O POS   Final     X-Rays:No results found.  EKG: Orders placed or performed in visit on 09/25/13  . EKG 12-Lead     Hospital Course: [name] Goodman is a 74 y.o. who was admitted to [facility]. They were brought to the operating room on 07/23/2015 and underwent Procedure(s): LEFT TOTAL KNEE ARTHROPLASTY.  Patient tolerated the procedure well and  was later transferred to the recovery room and then to the orthopaedic floor for postoperative care.  They were given PO and IV analgesics for pain control following their surgery.  They were given 24 hours of postoperative antibiotics of  Anti-infectives    Start     Dose/Rate Route Frequency Ordered Stop   07/23/15 1400  ceFAZolin (ANCEF) IVPB 2 g/50 mL premix     2 g 100 mL/hr over 30 Minutes Intravenous Every 6 hours 07/23/15 1002 07/23/15 2204   07/23/15 0511  ceFAZolin (ANCEF) IVPB 2 g/50 mL premix     2 g 100 mL/hr over 30 Minutes Intravenous On call to O.R. 07/23/15 1610 07/23/15 9604     and started on DVT prophylaxis in the form of Eliquis.   PT and OT were ordered for total joint protocol.  Discharge planning consulted to help with postop disposition and equipment needs.  Patient had a good night on the evening of surgery.  They started to get up OOB with therapy on day one. Hemovac drain was pulled without difficulty.  Dressing was checked on day one and was clean and dry. Patient was seen in rounds and wanted to look into going home later that day if he did well with therapy.  Arrangements were made for home.  Discharge home with home health if does well Diet - Cardiac diet Follow up - in 2 weeks Activity - WBAT Disposition - Home Condition Upon Discharge - Good D/C Meds - See DC Summary DVT Prophylaxis - Eliquis  Discharge Instructions    Call MD / Call 911    Complete by:  As directed   If you experience chest pain or shortness of breath, CALL 911 and be transported to the hospital emergency room.  If you develope a fever above 101 F, pus (white drainage) or increased drainage or redness at the wound, or calf pain, call your surgeon's office.     Change dressing    Complete by:  As directed   Change dressing daily with sterile 4 x 4 inch gauze dressing and apply TED hose. Do not submerge the incision under water.     Constipation Prevention    Complete by:  As directed    Drink plenty of fluids.  Prune juice may be helpful.  You may use a stool softener, such as Colace (over the counter) 100 mg twice a day.  Use MiraLax (over the counter) for constipation as needed.     Diet - low sodium heart healthy    Complete by:  As directed      Discharge instructions    Complete by:  As directed   Pick up stool softner and laxative for home use following surgery while on pain medications. Do  not submerge incision under water. May remove the surgical dressing tomorrow, Wednesday 07/25/2015, and then apply a dry gauze dressing daily. Please use good hand washing techniques while changing dressing each day. May shower starting three days after surgery starting on Thursday 07/26/2015. Please use a clean towel to pat the incision dry following showers. Continue to use ice for pain and swelling after surgery. Do not use any lotions or creams on the incision until instructed by your surgeon.  Postoperative Constipation Protocol  Constipation - defined medically as fewer than three stools per week and severe constipation as less than one stool per week.  One of the most common issues patients have following surgery is constipation. Even if you have a regular bowel pattern at home, your normal regimen is likely to be disrupted due to multiple reasons following surgery. Combination of anesthesia, postoperative narcotics, change in appetite and fluid intake all can affect your bowels. In order to avoid complications following surgery, here are some recommendations in order to help you during your recovery period.  Colace (docusate) - Pick up an over-the-counter form of Colace or another stool softener and take twice a day as long as you are requiring postoperative pain medications. Take with a full glass of water daily. If you experience loose stools or diarrhea, hold the colace until you stool forms back up. If your symptoms do not get better within 1 week or if they get worse,  check with your doctor.  Dulcolax (bisacodyl) - Pick up over-the-counter and take as directed by the product packaging as needed to assist with the movement of your bowels. Take with a full glass of water. Use this product as needed if not relieved by Colace only.   MiraLax (polyethylene glycol) - Pick up over-the-counter to have on hand. MiraLax is a solution that will increase the amount of water in your bowels to assist with bowel movements. Take as directed and can mix with a glass of water, juice, soda, coffee, or tea. Take if you go more than two days without a movement. Do not use MiraLax more than once per day. Call your doctor if you are still constipated or irregular after using this medication for 7 days in a row.  If you continue to have problems with postoperative constipation, please contact the office for further assistance and recommendations. If you experience "the worst abdominal pain ever" or develop nausea or vomiting, please contact the office immediatly for further recommendations for treatment.   Take Eliquis twice a day for three wees, then discontinue Eliquis. Once the patient has completed the Eliquis, they may resume the 81 mg Aspirin.     Do not put a pillow under the knee. Place it under the heel.    Complete by:  As directed      Do not sit on low chairs, stoools or toilet seats, as it may be difficult to get up from low surfaces    Complete by:  As directed      Driving restrictions    Complete by:  As directed   No driving until released by the physician.     Increase activity slowly as tolerated    Complete by:  As directed      Lifting restrictions    Complete by:  As directed   No lifting until released by the physician.     Patient may shower    Complete by:  As directed   You may shower without a dressing once  there is no drainage.  Do not wash over the wound.  If drainage remains, do not shower until drainage stops.     TED hose    Complete by:   As directed   Use stockings (TED hose) for 3 weeks on both leg(s).  You may remove them at night for sleeping.     Weight bearing as tolerated    Complete by:  As directed   Laterality:  left  Extremity:  Lower            Medication List    STOP taking these medications        aspirin EC 81 MG tablet      TAKE these medications        apixaban 2.5 MG Tabs tablet  Commonly known as:  ELIQUIS  Take 1 tablet (2.5 mg total) by mouth every 12 (twelve) hours. Take Eliquis twice a day for three weeks, then discontinue Eliquis. Once the patient has completed the Eliquis, they may resume the 81 mg Aspirin.     atorvastatin 10 MG tablet  Commonly known as:  LIPITOR  Take 10 mg by mouth daily.     diltiazem 240 MG 24 hr capsule  Commonly known as:  TIAZAC  Take 240 mg by mouth every morning.     fosinopril 20 MG tablet  Commonly known as:  MONOPRIL  Take 20 mg by mouth every morning.     hydrochlorothiazide 25 MG tablet  Commonly known as:  HYDRODIURIL  Take 25 mg by mouth every morning.     methocarbamol 500 MG tablet  Commonly known as:  ROBAXIN  Take 1 tablet (500 mg total) by mouth every 6 (six) hours as needed for muscle spasms.     oxyCODONE 5 MG immediate release tablet  Commonly known as:  Oxy IR/ROXICODONE  Take 1-2 tablets (5-10 mg total) by mouth every 3 (three) hours as needed for moderate pain or severe pain.     traMADol 50 MG tablet  Commonly known as:  ULTRAM  Take 1-2 tablets (50-100 mg total) by mouth every 6 (six) hours as needed (mild pain).           Follow-up Information    Follow up with Gearlean Alf, MD. Schedule an appointment as soon as possible for a visit on 08/07/2015.   Specialty:  Orthopedic Surgery   Why:  Call office at [phone] to setup appointment on Tuesday 08/07/2015 with Dr. Wynelle Link.   Contact information:   [address] [place] [address] [phone]       Signed: Arlee Muslim, PA-C Orthopaedic  Surgery 07/24/2015, 8:45 AM

## 2015-07-24 NOTE — Progress Notes (Signed)
   Subjective: 1 Day Post-Op Procedure(s) (LRB): LEFT TOTAL KNEE ARTHROPLASTY (Left) Patient reports pain as mild.   Patient seen in rounds with Dr. Lequita HaltAluisio.  Family in room at bedside.  Had a good night.  Wants to look into going home later today if he does well with therapy. Patient is well, but has had some minor complaints of pain in the knee, requiring pain medications Patient is ready to go home later today if meets goals and tolerating meds  Objective: Vital signs in last 24 hours: Temp:  [95.6 F (35.3 C)-98.8 F (37.1 C)] 97.9 F (36.6 C) (11/01 0500) Pulse Rate:  [51-82] 63 (11/01 0500) Resp:  [11-16] 16 (11/01 0500) BP: (119-143)/(66-86) 133/75 mmHg (11/01 0500) SpO2:  [94 %-100 %] 96 % (11/01 0500)  Intake/Output from previous day:  Intake/Output Summary (Last 24 hours) at 07/24/15 0834 Last data filed at 07/24/15 0830  Gross per 24 hour  Intake 2542.5 ml  Output   1820 ml  Net  722.5 ml    Intake/Output this shift: Total I/O In: 240 [P.O.:240] Out: -   Labs:  Recent Labs  07/24/15 0434  HGB 13.4    Recent Labs  07/24/15 0434  WBC 12.7*  RBC 4.41  HCT 39.9  PLT 161    Recent Labs  07/24/15 0434  NA 138  K 3.8  CL 104  CO2 27  BUN 25*  CREATININE 1.08  GLUCOSE 160*  CALCIUM 8.9   No results for input(s): LABPT, INR in the last 72 hours.  EXAM: General - Patient is Alert, Appropriate and Oriented Extremity - Neurovascular intact Sensation intact distally Dorsiflexion/Plantar flexion intact Dressing - clean, dry Motor Function - intact, moving foot and toes well on exam.  Hemovac pulled.  Assessment/Plan: 1 Day Post-Op Procedure(s) (LRB): LEFT TOTAL KNEE ARTHROPLASTY (Left) Procedure(s) (LRB): LEFT TOTAL KNEE ARTHROPLASTY (Left) Past Medical History  Diagnosis Date  . Hypertension   . Arthritis   . History of kidney stones   . Nocturia    Principal Problem:   OA (osteoarthritis) of knee  Estimated body mass index is  28.24 kg/(m^2) as calculated from the following:   Height as of this encounter: 6\' 1"  (1.854 m).   Weight as of this encounter: 97.07 kg (214 lb). Advance diet Up with therapy Discharge home with home health if does well Diet - Cardiac diet Follow up - in 2 weeks Activity - WBAT Disposition - Home Condition Upon Discharge - Good D/C Meds - See DC Summary DVT Prophylaxis - Eliquis  Avel Peacerew Rheanna Sergent, PA-C Orthopaedic Surgery 07/24/2015, 8:34 AM

## 2015-07-26 ENCOUNTER — Encounter (HOSPITAL_COMMUNITY): Payer: Self-pay | Admitting: Orthopedic Surgery

## 2015-12-07 DIAGNOSIS — M3501 Sicca syndrome with keratoconjunctivitis: Secondary | ICD-10-CM | POA: Diagnosis not present

## 2016-02-14 DIAGNOSIS — N2889 Other specified disorders of kidney and ureter: Secondary | ICD-10-CM | POA: Diagnosis not present

## 2016-02-14 DIAGNOSIS — N200 Calculus of kidney: Secondary | ICD-10-CM | POA: Diagnosis not present

## 2016-07-03 DIAGNOSIS — Z0000 Encounter for general adult medical examination without abnormal findings: Secondary | ICD-10-CM | POA: Diagnosis not present

## 2016-07-03 DIAGNOSIS — I10 Essential (primary) hypertension: Secondary | ICD-10-CM | POA: Diagnosis not present

## 2016-07-03 DIAGNOSIS — Z125 Encounter for screening for malignant neoplasm of prostate: Secondary | ICD-10-CM | POA: Diagnosis not present

## 2016-07-03 DIAGNOSIS — L570 Actinic keratosis: Secondary | ICD-10-CM | POA: Diagnosis not present

## 2016-07-03 DIAGNOSIS — E785 Hyperlipidemia, unspecified: Secondary | ICD-10-CM | POA: Diagnosis not present

## 2016-08-26 DIAGNOSIS — N2889 Other specified disorders of kidney and ureter: Secondary | ICD-10-CM | POA: Diagnosis not present

## 2016-08-26 DIAGNOSIS — N200 Calculus of kidney: Secondary | ICD-10-CM | POA: Diagnosis not present

## 2016-10-30 DIAGNOSIS — M79645 Pain in left finger(s): Secondary | ICD-10-CM | POA: Diagnosis not present

## 2016-10-30 DIAGNOSIS — M79674 Pain in right toe(s): Secondary | ICD-10-CM | POA: Diagnosis not present

## 2017-01-21 DIAGNOSIS — B029 Zoster without complications: Secondary | ICD-10-CM | POA: Diagnosis not present

## 2017-02-22 ENCOUNTER — Encounter: Payer: Self-pay | Admitting: Emergency Medicine

## 2017-02-22 ENCOUNTER — Emergency Department
Admission: EM | Admit: 2017-02-22 | Discharge: 2017-02-22 | Disposition: A | Discharge status: Still In Hospital | Payer: PPO | Attending: Emergency Medicine | Admitting: Emergency Medicine

## 2017-02-22 ENCOUNTER — Observation Stay
Admission: EM | Admit: 2017-02-22 | Discharge: 2017-02-23 | Disposition: A | Discharge status: Home / Self Care | Payer: PPO | Attending: Internal Medicine | Admitting: Internal Medicine

## 2017-02-22 DIAGNOSIS — E876 Hypokalemia: Secondary | ICD-10-CM | POA: Diagnosis not present

## 2017-02-22 DIAGNOSIS — Z7901 Long term (current) use of anticoagulants: Secondary | ICD-10-CM | POA: Diagnosis not present

## 2017-02-22 DIAGNOSIS — Z809 Family history of malignant neoplasm, unspecified: Secondary | ICD-10-CM | POA: Insufficient documentation

## 2017-02-22 DIAGNOSIS — R42 Dizziness and giddiness: Secondary | ICD-10-CM | POA: Insufficient documentation

## 2017-02-22 DIAGNOSIS — R001 Bradycardia, unspecified: Secondary | ICD-10-CM | POA: Diagnosis present

## 2017-02-22 DIAGNOSIS — Z79899 Other long term (current) drug therapy: Secondary | ICD-10-CM | POA: Insufficient documentation

## 2017-02-22 DIAGNOSIS — Z7982 Long term (current) use of aspirin: Secondary | ICD-10-CM | POA: Insufficient documentation

## 2017-02-22 DIAGNOSIS — I10 Essential (primary) hypertension: Secondary | ICD-10-CM | POA: Diagnosis not present

## 2017-02-22 DIAGNOSIS — Z96652 Presence of left artificial knee joint: Secondary | ICD-10-CM | POA: Diagnosis not present

## 2017-02-22 DIAGNOSIS — Z8249 Family history of ischemic heart disease and other diseases of the circulatory system: Secondary | ICD-10-CM | POA: Diagnosis not present

## 2017-02-22 DIAGNOSIS — E860 Dehydration: Secondary | ICD-10-CM

## 2017-02-22 DIAGNOSIS — R110 Nausea: Secondary | ICD-10-CM | POA: Insufficient documentation

## 2017-02-22 DIAGNOSIS — Z87891 Personal history of nicotine dependence: Secondary | ICD-10-CM | POA: Diagnosis not present

## 2017-02-22 DIAGNOSIS — R112 Nausea with vomiting, unspecified: Secondary | ICD-10-CM | POA: Diagnosis not present

## 2017-02-22 LAB — CBC WITH DIFFERENTIAL/PLATELET
BASOS PCT: 1 %
Basophils Absolute: 0 10*3/uL (ref 0–0.1)
Eosinophils Absolute: 0.1 10*3/uL (ref 0–0.7)
Eosinophils Relative: 2 %
HEMATOCRIT: 47.1 % (ref 40.0–52.0)
HEMOGLOBIN: 16 g/dL (ref 13.0–18.0)
LYMPHS PCT: 21 %
Lymphs Abs: 0.9 10*3/uL — ABNORMAL LOW (ref 1.0–3.6)
MCH: 31.2 pg (ref 26.0–34.0)
MCHC: 34 g/dL (ref 32.0–36.0)
MCV: 91.8 fL (ref 80.0–100.0)
MONOS PCT: 9 %
Monocytes Absolute: 0.4 10*3/uL (ref 0.2–1.0)
NEUTROS ABS: 2.9 10*3/uL (ref 1.4–6.5)
NEUTROS PCT: 67 %
Platelets: 137 10*3/uL — ABNORMAL LOW (ref 150–440)
RBC: 5.13 MIL/uL (ref 4.40–5.90)
RDW: 14 % (ref 11.5–14.5)
WBC: 4.3 10*3/uL (ref 3.8–10.6)

## 2017-02-22 LAB — URINALYSIS, COMPLETE (UACMP) WITH MICROSCOPIC
Bacteria, UA: NONE SEEN
Bilirubin Urine: NEGATIVE
GLUCOSE, UA: NEGATIVE mg/dL
Hgb urine dipstick: NEGATIVE
KETONES UR: NEGATIVE mg/dL
Leukocytes, UA: NEGATIVE
NITRITE: NEGATIVE
PH: 6 (ref 5.0–8.0)
Protein, ur: NEGATIVE mg/dL
SPECIFIC GRAVITY, URINE: 1.017 (ref 1.005–1.030)

## 2017-02-22 LAB — TROPONIN I
Troponin I: <0.03 ng/mL (ref <0.03)
Troponin I: <0.03 ng/mL (ref <0.03)

## 2017-02-22 LAB — TSH: TSH: 1.257 u[IU]/mL (ref 0.350–4.500)

## 2017-02-22 LAB — COMPREHENSIVE METABOLIC PANEL
ALBUMIN: 4.3 g/dL (ref 3.5–5.0)
ALT: 22 U/L (ref 17–63)
ANION GAP: 8 (ref 5–15)
AST: 21 U/L (ref 15–41)
Alkaline Phosphatase: 45 U/L (ref 38–126)
BUN: 32 mg/dL — ABNORMAL HIGH (ref 6–20)
CHLORIDE: 104 mmol/L (ref 101–111)
CO2: 28 mmol/L (ref 22–32)
Calcium: 9 mg/dL (ref 8.9–10.3)
Creatinine, Ser: 1.11 mg/dL (ref 0.61–1.24)
GFR calc Af Amer: >60 mL/min (ref >60)
GFR calc non Af Amer: >60 mL/min (ref >60)
GLUCOSE: 138 mg/dL — AB (ref 65–99)
POTASSIUM: 3.2 mmol/L — AB (ref 3.5–5.1)
SODIUM: 140 mmol/L (ref 135–145)
TOTAL PROTEIN: 6.7 g/dL (ref 6.5–8.1)
Total Bilirubin: 1 mg/dL (ref 0.3–1.2)

## 2017-02-22 LAB — MAGNESIUM: Magnesium: 1.9 mg/dL (ref 1.7–2.4)

## 2017-02-22 LAB — CK: Total CK: 120 U/L (ref 49–397)

## 2017-02-22 MED ORDER — SODIUM CHLORIDE 0.9 % IV SOLN: Freq: Once | INTRAVENOUS | Status: DC

## 2017-02-22 MED ORDER — POTASSIUM CHLORIDE CRYS ER 20 MEQ PO TBCR
40.0000 meq | EXTENDED_RELEASE_TABLET | Freq: Once | ORAL | Status: AC
  Administered 2017-02-22: 40 meq via ORAL
  Filled 2017-02-22: qty 2

## 2017-02-22 MED ORDER — SODIUM CHLORIDE 0.9 % IV SOLN
Freq: Once | INTRAVENOUS | Status: AC
  Administered 2017-02-22: 08:00:00 via INTRAVENOUS

## 2017-02-22 MED ORDER — SODIUM CHLORIDE 0.9% FLUSH
3.0000 mL | Freq: Two times a day (BID) | INTRAVENOUS | Status: DC
  Administered 2017-02-22 – 2017-02-23 (×3): 3 mL via INTRAVENOUS

## 2017-02-22 MED ORDER — SODIUM CHLORIDE 0.9 % IV SOLN
Freq: Once | INTRAVENOUS | Status: AC
  Administered 2017-02-22: 07:00:00 via INTRAVENOUS

## 2017-02-22 MED ORDER — ASPIRIN EC 81 MG PO TBEC
81.0000 mg | DELAYED_RELEASE_TABLET | Freq: Every day | ORAL | Status: DC
  Administered 2017-02-22 – 2017-02-23 (×2): 81 mg via ORAL
  Filled 2017-02-22 (×2): qty 1

## 2017-02-22 MED ORDER — HYDROCHLOROTHIAZIDE 25 MG PO TABS
25.0000 mg | ORAL_TABLET | Freq: Every morning | ORAL | Status: DC
  Administered 2017-02-22 – 2017-02-23 (×2): 25 mg via ORAL
  Filled 2017-02-22 (×2): qty 1

## 2017-02-22 MED ORDER — POLYETHYLENE GLYCOL 3350 17 G PO PACK: 17.0000 g | PACK | Freq: Every day | ORAL | Status: DC | PRN

## 2017-02-22 MED ORDER — ATORVASTATIN CALCIUM 10 MG PO TABS
10.0000 mg | ORAL_TABLET | Freq: Every day | ORAL | Status: DC
  Administered 2017-02-22 – 2017-02-23 (×2): 10 mg via ORAL
  Filled 2017-02-22 (×2): qty 1

## 2017-02-22 MED ORDER — AMLODIPINE BESYLATE 5 MG PO TABS
2.5000 mg | ORAL_TABLET | Freq: Every day | ORAL | Status: DC
  Administered 2017-02-22 – 2017-02-23 (×2): 2.5 mg via ORAL
  Filled 2017-02-22 (×2): qty 1

## 2017-02-22 MED ORDER — ONDANSETRON HCL 4 MG PO TABS: 4.0000 mg | ORAL_TABLET | Freq: Four times a day (QID) | ORAL | Status: DC | PRN

## 2017-02-22 MED ORDER — ENOXAPARIN SODIUM 40 MG/0.4ML ~~LOC~~ SOLN
40.0000 mg | SUBCUTANEOUS | Status: DC
  Filled 2017-02-22: qty 0.4

## 2017-02-22 MED ORDER — ONDANSETRON HCL 4 MG/2ML IJ SOLN: 4.0000 mg | Freq: Four times a day (QID) | INTRAMUSCULAR | Status: DC | PRN

## 2017-02-22 MED ORDER — ACETAMINOPHEN 650 MG RE SUPP: 650.0000 mg | Freq: Four times a day (QID) | RECTAL | Status: DC | PRN

## 2017-02-22 MED ORDER — LISINOPRIL 10 MG PO TABS
10.0000 mg | ORAL_TABLET | Freq: Every day | ORAL | Status: DC
  Administered 2017-02-22 – 2017-02-23 (×2): 10 mg via ORAL
  Filled 2017-02-22 (×2): qty 1

## 2017-02-22 MED ORDER — ACETAMINOPHEN 325 MG PO TABS: 650.0000 mg | ORAL_TABLET | Freq: Four times a day (QID) | ORAL | Status: DC | PRN

## 2017-02-22 NOTE — ED Provider Notes (Signed)
[facility] Emergency Department Provider Note   ____________________________________________   First MD Initiated Contact with Patient 02/22/17 [phone]     (approximate)  I have reviewed the triage vital signs and the nursing notes.   HISTORY  Chief Complaint No chief complaint on file. weak and dizzy  HPI [name] Goodman is a 76 y.o. male who played golf all day long and wasn't drinking very much today. He got up to go the bathroom and got weak and dizzy and lightheaded and nauseated. He did not pass out. When EMS arrived they noted his blood pressure was 160 systolic lying down and 110 systolic sitting up in he got dizzy lightheaded and nauseated when he sat up.he did not have any pain anywhere any shortness of breath vomiting or any other complaints.   No past medical history on file.  There are no active problems to display for this patient.   No past surgical history on file.  Prior to Admission medications   Not on File    Allergies Patient has no allergy information on record.  No family history on file.  Social History Social History  Substance Use Topics  . Smoking status: Not on file  . Smokeless tobacco: Not on file  . Alcohol use Not on file    Review of Systems  Constitutional: No fever/chills Eyes: No visual changes. ENT: No sore throat. Cardiovascular: Denies chest pain. Respiratory: Denies shortness of breath. Gastrointestinal: No abdominal pain.  No nausea, no vomiting.  No diarrhea.  No constipation. Genitourinary: Negative for dysuria. Musculoskeletal: Negative for back pain. Skin: Negative for rash. Neurological: Negative for headaches, focal weakness or numbness.   ____________________________________________   PHYSICAL EXAM:  VITAL SIGNS: ED Triage Vitals  Enc Vitals Group     BP      Pulse      Resp      Temp      Temp src      SpO2      Weight      Height      Head Circumference      Peak Flow       Pain Score      Pain Loc      Pain Edu?      Excl. in GC?     Constitutional: Alert and oriented. Well appearing and in no acute distress. Eyes: Conjunctivae are normal.  Head: Atraumatic. Nose: No congestion/rhinnorhea. Mouth/Throat: Mucous membranes are moist.  Oropharynx non-erythematous. Neck: No stridor. Cardiovascular: Normal rate, regular rhythm. Grossly normal heart sounds.  Good peripheral circulation. Respiratory: Normal respiratory effort.  No retractions. Lungs CTAB. Gastrointestinal: Soft and nontender. No distention. No abdominal bruits. No CVA tenderness. }Musculoskeletal: No lower extremity tenderness nor edema.  No joint effusions. Neurologic:  Normal speech and language. No gross focal neurologic deficits are appreciated. Skin:  Skin is warm, dry and intact. No rash noted. Psychiatric: Mood and affect are normal. Speech and behavior are normal.  ____________________________________________   LABS (all labs ordered are listed, but only abnormal results are displayed)  Labs Reviewed  COMPREHENSIVE METABOLIC PANEL  TROPONIN I  CBC WITH DIFFERENTIAL/PLATELET  URINALYSIS, COMPLETE (UACMP) WITH MICROSCOPIC  CK   ____________________________________________  EKG   ____________________________________________  RADIOLOGY   ____________________________________________   PROCEDURES  Procedure(s) performed:  Procedures  Critical Care performed:     ____________________________________________   INITIAL IMPRESSION / ASSESSMENT AND PLAN / ED COURSE  Pertinent labs & imaging results that were available during  my care of the patient were reviewed by me and considered in my medical decision making (see chart for details).  Dr Lenard Lance will assume care.      ____________________________________________   FINAL CLINICAL IMPRESSION(S) / ED DIAGNOSES  Final diagnoses:  Dehydration  Bradycardia      NEW MEDICATIONS STARTED DURING  THIS VISIT:  There are no discharge medications for this patient.    Note:  This document was prepared using Dragon voice recognition software and may include unintentional dictation errors.    Arnaldo Natal, MD 02/22/17 [phone]

## 2017-02-22 NOTE — Care Management Obs Status (Signed)
MEDICARE OBSERVATION STATUS NOTIFICATION   Patient Details  Name: [name] Goodman W Witte MRN: [id number] Date of Birth: 11/16/1940   Medicare Observation Status Notification Given:  Yes (bradycardia)    Caren MacadamMichelle Abagael Kramm, RN 02/22/2017, 11:15 AM

## 2017-02-22 NOTE — ED Notes (Signed)
Pt heart rate in the 40s, MD notified.

## 2017-02-22 NOTE — ED Provider Notes (Signed)
-----------------------------------------   8:51 AM on 02/22/2017 -----------------------------------------  I have seen and evaluated the patient. Patient continues to state intermittent dizziness/lightheadedness along with nausea. Patient's heart rate is maintaining in the mid 40s to lower 50s. It is now drop twice into the 30s. I highly suspect the patient's symptoms are due to symptomatic bradycardia. Patient's labs are largely within normal limits. In reviewing the patient's medications he does take diltiazem extended release. We will discontinue this medication. Given the patient remains symptomatic we will admit to the hospital for further monitoring. Patient agreeable to plan.   Minna AntisPaduchowski, Marshia Tropea, MD 02/22/17 [phone]

## 2017-02-22 NOTE — ED Provider Notes (Signed)
[facility] Emergency Department Provider Note   ____________________________________________   First MD Initiated Contact with Patient 02/22/17 [phone]     (approximate)  I have reviewed the triage vital signs and the nursing notes.   HISTORY  Chief Complaint Dehydration   HPI [name] Goodman is a 76 y.o. male who reports he was playing golf and drinking a lot of sweet tea but not a lot of water yesterday in the hot sun. He went to bed woke up at night to go to the bathroom and got really weak and dizzy and lightheaded. EMS went to get him and found that his blood pressure was 166 laying down and 110 sitting up he got bradycardic when he sat up and move weak and dizzy. They brought him into the emergency room. Patient feels well laying down when he sits up he still gets weak and dizzy.  Past Medical History:  Diagnosis Date  . Arthritis   . History of kidney stones   . Hypertension   . Nocturia     Patient Active Problem List   Diagnosis Date Noted  . OA (osteoarthritis) of knee 07/23/2015    Past Surgical History:  Procedure Laterality Date  . APPENDECTOMY    . KNEE ARTHROSCOPY    . POLYPECTOMY    . TOTAL KNEE ARTHROPLASTY Left 07/23/2015   Procedure: LEFT TOTAL KNEE ARTHROPLASTY;  Surgeon: Ollen Gross, MD;  Location: [facility];  Service: Orthopedics;  Laterality: Left;  . TRANSURETHRAL RESECTION OF PROSTATE  ?2015    Prior to Admission medications   Medication Sig Start Date End Date Taking? Authorizing Provider  apixaban (ELIQUIS) 2.5 MG TABS tablet Take 1 tablet (2.5 mg total) by mouth every 12 (twelve) hours. Take Eliquis twice a day for three weeks, then discontinue Eliquis. Once the patient has completed the Eliquis, they may resume the 81 mg Aspirin. 07/24/15   Perkins, Alexzandrew L, PA-C  atorvastatin (LIPITOR) 10 MG tablet Take 10 mg by mouth daily.    [provider]  diltiazem (TIAZAC) 240 MG 24 hr capsule Take 240 mg by  mouth every morning.    [provider]  fosinopril (MONOPRIL) 20 MG tablet Take 20 mg by mouth every morning.    [provider]  hydrochlorothiazide (HYDRODIURIL) 25 MG tablet Take 25 mg by mouth every morning.    [provider]  methocarbamol (ROBAXIN) 500 MG tablet Take 1 tablet (500 mg total) by mouth every 6 (six) hours as needed for muscle spasms. 07/24/15   Perkins, Alexzandrew L, PA-C  oxyCODONE (OXY IR/ROXICODONE) 5 MG immediate release tablet Take 1-2 tablets (5-10 mg total) by mouth every 3 (three) hours as needed for moderate pain or severe pain. 07/24/15   Perkins, Alexzandrew L, PA-C  traMADol (ULTRAM) 50 MG tablet Take 1-2 tablets (50-100 mg total) by mouth every 6 (six) hours as needed (mild pain). 07/24/15   Perkins, Alexzandrew L, PA-C    Allergies Patient has no known allergies.  No family history on file.  Social History Social History  Substance Use Topics  . Smoking status: Former Smoker    Quit date: 07/17/1975  . Smokeless tobacco: Never Used  . Alcohol use No    Review of Systems  Constitutional: No fever/chills Eyes: No visual changes. ENT: No sore throat. Cardiovascular: Denies chest pain. Respiratory: Denies shortness of breath. Gastrointestinal: No abdominal pain.  No nausea, no vomiting.  No diarrhea.  No constipation. Genitourinary: Negative for dysuria. Musculoskeletal: Negative  for back pain. Skin: Negative for rash. Neurological: Negative for headaches, focal weakness or numbness.  ____________________________________________   PHYSICAL EXAM:  VITAL SIGNS: ED Triage Vitals  Enc Vitals Group     BP 02/22/17 0612 (!) 172/90     Pulse Rate 02/22/17 0612 (!) 49     Resp 02/22/17 0612 18     Temp --      Temp src --      SpO2 02/22/17 0612 100 %     Weight 02/22/17 0613 210 lb (95.3 kg)     Height 02/22/17 0613 6\' 1"  (1.854 m)     Head Circumference --      Peak Flow --      Pain Score --      Pain Loc --       Pain Edu? --      Excl. in GC? --     Constitutional: Alert and oriented. Well appearing and in no acute distress. Eyes: Conjunctivae are normal. PERRL. EOMI. Head: Atraumatic. Nose: No congestion/rhinnorhea. Mouth/Throat: Mucous membranes are moist.  Oropharynx non-erythematous. Neck: No stridor.  Cardiovascular: Slow rate, regular rhythm. Grossly normal heart sounds.  Good peripheral circulation. Respiratory: Normal respiratory effort.  No retractions. Lungs CTAB. Gastrointestinal: Soft and nontender. No distention. No abdominal bruits. No CVA tenderness. Neurologic:  Normal speech and language. No gross focal neurologic deficits are appreciated. No gait instability. Skin:  Skin is warm, dry and intact. No rash noted. Psychiatric: Mood and affect are normal. Speech and behavior are normal.  ____________________________________________   LABS (all labs ordered are listed, but only abnormal results are displayed)  Labs Reviewed  COMPREHENSIVE METABOLIC PANEL  TROPONIN I  CBC WITH DIFFERENTIAL/PLATELET  URINALYSIS, COMPLETE (UACMP) WITH MICROSCOPIC  CK   ____________________________________________  EKG   ____________________________________________  RADIOLOGY   ____________________________________________   PROCEDURES  Procedure(s) performed:  Procedures  Critical Care performed:   ____________________________________________   INITIAL IMPRESSION / ASSESSMENT AND PLAN / ED COURSE  Pertinent labs & imaging results that were available during my care of the patient were reviewed by me and considered in my medical decision making (see chart for details).  Patient awaiting labs EKG signed out to Dr. Lenard LancePaduchowski      ____________________________________________   FINAL CLINICAL IMPRESSION(S) / ED DIAGNOSES  Final diagnoses:  Dehydration      NEW MEDICATIONS STARTED DURING THIS VISIT:  New Prescriptions   No medications on file     Note:   This document was prepared using Dragon voice recognition software and may include unintentional dictation errors.    Arnaldo NatalMalinda, Rocky Rishel F, MD 02/22/17 [phone]

## 2017-02-22 NOTE — H&P (Signed)
[facility] [employer] [facility] [place] [facility]   PATIENT NAME: [name] Goodman    MR#:  [id number]  DATE OF BIRTH:  1941/07/11  DATE OF ADMISSION:  02/22/2017  PRIMARY CARE PHYSICIAN: [name] Mina, MD   REQUESTING/REFERRING PHYSICIAN: Dr. Lenard [name] Goodman  CHIEF COMPLAINT:   Chief Complaint  Patient presents with  . Dehydration    HISTORY OF PRESENT ILLNESS:  [name] Goodman  is a 76 y.o. male with a known history of Hypertension presents to the hospital complaining of dizziness, weakness and nausea. Patient was out golfing in the sun yesterday and did not drink enough water. Today he woke up in the morning and had to stagger to the bathroom got dizzy and nauseous. He did feel better after getting fluids. He was orthostatic with EMS. In the emergency room his heart rate went into the 30s with symptoms of nausea and dizziness. Patient is on Cardizem which is not new medication. Reviewing his primary care physician's office notes his heart rate has fluctuated between 60-70. EKG shows sinus bradycardia at 50. And troponin normal. Potassium 3.2.  PAST MEDICAL HISTORY:   Past Medical History:  Diagnosis Date  . Arthritis   . History of kidney stones   . Hypertension   . Nocturia     PAST SURGICAL HISTORY:   Past Surgical History:  Procedure Laterality Date  . APPENDECTOMY    . KNEE ARTHROSCOPY    . POLYPECTOMY    . TOTAL KNEE ARTHROPLASTY Left 07/23/2015   Procedure: LEFT TOTAL KNEE ARTHROPLASTY;  Surgeon: [name] Gross, MD;  Location: [facility];  Service: Orthopedics;  Laterality: Left;  . TRANSURETHRAL RESECTION OF PROSTATE  ?2015    SOCIAL HISTORY:   Social History  Substance Use Topics  . Smoking status: Former Smoker    Quit date: 07/17/1975  . Smokeless tobacco: Never Used  . Alcohol use No    FAMILY HISTORY:   Family History  Problem Relation Age of Onset  . Hypertension Father   . Cancer Father     DRUG ALLERGIES:  No Known Allergies  REVIEW OF  SYSTEMS:   Review of Systems  Constitutional: Positive for malaise/fatigue. Negative for chills and fever.  HENT: Negative for sore throat.   Eyes: Negative for blurred vision, double vision and pain.  Respiratory: Negative for cough, hemoptysis, shortness of breath and wheezing.   Cardiovascular: Negative for chest pain, palpitations, orthopnea and leg swelling.  Gastrointestinal: Positive for nausea. Negative for abdominal pain, constipation, diarrhea, heartburn and vomiting.  Genitourinary: Negative for dysuria and hematuria.  Musculoskeletal: Negative for back pain and joint pain.  Skin: Negative for rash.  Neurological: Positive for dizziness and weakness. Negative for sensory change, speech change, focal weakness and headaches.  Endo/Heme/Allergies: Does not bruise/bleed easily.  Psychiatric/Behavioral: Negative for depression. The patient is not nervous/anxious.     MEDICATIONS AT HOME:   Prior to Admission medications   Medication Sig Start Date End Date Taking? Authorizing Provider  aspirin EC 81 MG tablet Take 81 mg by mouth daily.   Yes [provider]  atorvastatin (LIPITOR) 10 MG tablet Take 10 mg by mouth daily.   Yes [provider]  diltiazem (TIAZAC) 240 MG 24 hr capsule Take 240 mg by mouth every morning.   Yes [provider]  fosinopril (MONOPRIL) 20 MG tablet Take 20 mg by mouth every morning.   Yes [provider]  hydrochlorothiazide (HYDRODIURIL) 25 MG tablet Take 25 mg by mouth every morning.   Yes [provider]  apixaban (ELIQUIS) 2.5 MG TABS tablet Take 1 tablet (2.5 mg total) by mouth every 12 (twelve) hours. Take Eliquis twice a day for three weeks, then discontinue Eliquis. Once the patient has completed the Eliquis, they may resume the 81 mg Aspirin. Patient not taking: Reported on 02/22/2017 07/24/15   [name] Goodman, [name] L, PA-C  methocarbamol (ROBAXIN) 500 MG tablet Take 1 tablet (500 mg total) by mouth every  6 (six) hours as needed for muscle spasms. Patient not taking: Reported on 02/22/2017 07/24/15   [name] Goodman, [name] L, PA-C  oxyCODONE (OXY IR/ROXICODONE) 5 MG immediate release tablet Take 1-2 tablets (5-10 mg total) by mouth every 3 (three) hours as needed for moderate pain or severe pain. Patient not taking: Reported on 02/22/2017 07/24/15   [name] Goodman, [name] L, PA-C  traMADol (ULTRAM) 50 MG tablet Take 1-2 tablets (50-100 mg total) by mouth every 6 (six) hours as needed (mild pain). Patient not taking: Reported on 02/22/2017 07/24/15   Perkins, [name] L, PA-C     VITAL SIGNS:  Blood pressure (!) 157/85, pulse (!) 47, resp. rate 13, height 6\' 1"  (1.854 m), weight 95.3 kg (210 lb), SpO2 100 %.  PHYSICAL EXAMINATION:  Physical Exam  GENERAL:  76 y.o.-year-old patient lying in the bed with no acute distress.  EYES: Pupils equal, round, reactive to light and accommodation. No scleral icterus. Extraocular muscles intact.  HEENT: Head atraumatic, normocephalic. Oropharynx and nasopharynx clear. No oropharyngeal erythema, moist oral mucosa  NECK:  Supple, no jugular venous distention. No thyroid enlargement, no tenderness.  LUNGS: Normal breath sounds bilaterally, no wheezing, rales, rhonchi. No use of accessory muscles of respiration.  CARDIOVASCULAR: S1, S2 . Bradycardia ABDOMEN: Soft, nontender, nondistended. Bowel sounds present. No organomegaly or mass.  EXTREMITIES: No pedal edema, cyanosis, or clubbing. + 2 pedal & radial pulses b/Goodman.   NEUROLOGIC: Cranial nerves II through XII are intact. No focal Motor or sensory deficits appreciated b/Goodman PSYCHIATRIC: The patient is alert and oriented x 3. Good affect.  SKIN: No obvious rash, lesion, or ulcer.   LABORATORY PANEL:   CBC  Recent Labs Lab 02/22/17 0619  WBC 4.3  HGB 16.0  HCT 47.1  PLT 137*   ------------------------------------------------------------------------------------------------------------------  Chemistries    Recent Labs Lab 02/22/17 0619  NA 140  K 3.2*  CL 104  CO2 28  GLUCOSE 138*  BUN 32*  CREATININE 1.11  CALCIUM 9.0  AST 21  ALT 22  ALKPHOS 45  BILITOT 1.0   ------------------------------------------------------------------------------------------------------------------  Cardiac Enzymes  Recent Labs Lab 02/22/17 0619  TROPONINI <0.03   ------------------------------------------------------------------------------------------------------------------  RADIOLOGY:  No results found.   IMPRESSION AND PLAN:   * Sinus Bradycardia Due to Cardizem. Patient does have heart rate going into the 30s with symptoms of nausea and dizziness. We'll admit patient under observation for monitoring overnight on telemetry. Check TSH level. Discontinue Cardizem. We'll have cardiology evaluate the patient if bradycardia does not improve or if he needs a pacemaker.  * Hypertension. Continue home medications except Cardizem. We'll add Norvasc in its place.  * Hypokalemia. Replace orally. Check magnesium.  * DVT prophylaxis with Lovenox  All the records are reviewed and case discussed with ED provider. Management plans discussed with the patient, family and they are in agreement.  CODE STATUS: Full  TOTAL TIME TAKING CARE OF THIS PATIENT: 40 minutes.   Milagros LollSudini, Rosaisela Jamroz R M.D on 02/22/2017 at 9:46 AM  Between 7am to 6pm - Pager - [phone]  After 6pm go  to [email or web address] - password [email or web address]  [facility]  Office  [phone]  CC: Primary care physician; [name] Mina, MD  Note: This dictation was prepared with Dragon dictation along with smaller phrase technology. Any transcriptional errors that result from this process are unintentional.

## 2017-02-23 DIAGNOSIS — E876 Hypokalemia: Secondary | ICD-10-CM | POA: Diagnosis not present

## 2017-02-23 DIAGNOSIS — R42 Dizziness and giddiness: Secondary | ICD-10-CM | POA: Diagnosis not present

## 2017-02-23 DIAGNOSIS — R001 Bradycardia, unspecified: Secondary | ICD-10-CM | POA: Diagnosis not present

## 2017-02-23 DIAGNOSIS — I10 Essential (primary) hypertension: Secondary | ICD-10-CM | POA: Diagnosis not present

## 2017-02-23 LAB — BASIC METABOLIC PANEL
Anion gap: 7 (ref 5–15)
BUN: 27 mg/dL — ABNORMAL HIGH (ref 6–20)
CO2: 29 mmol/L (ref 22–32)
Calcium: 8.8 mg/dL — ABNORMAL LOW (ref 8.9–10.3)
Chloride: 105 mmol/L (ref 101–111)
Creatinine, Ser: 1.23 mg/dL (ref 0.61–1.24)
GFR calc Af Amer: >60 mL/min (ref >60)
GFR calc non Af Amer: 55 mL/min — ABNORMAL LOW (ref >60)
Glucose, Bld: 115 mg/dL — ABNORMAL HIGH (ref 65–99)
Potassium: 3.5 mmol/L (ref 3.5–5.1)
SODIUM: 141 mmol/L (ref 135–145)

## 2017-02-23 MED ORDER — AMLODIPINE BESYLATE 2.5 MG PO TABS: 2.5000 mg | ORAL_TABLET | Freq: Every day | ORAL | 0 refills | Status: DC

## 2017-02-23 MED ORDER — BLOOD PRESSURE KIT: 1.0000 [IU] | PACK | Freq: Every day | 0 refills | Status: DC

## 2017-02-23 NOTE — Progress Notes (Signed)
Discharge instructions explained to pt and pts spouse/ verbalized an understanding/ iv and tele removed/ transported off unit via wheelchair.  

## 2017-02-23 NOTE — Discharge Instructions (Signed)
Resume activity and diet as before.  Check blood pressure daily and keep log. Take to your doctor's appointment.

## 2017-02-23 NOTE — Discharge Summary (Signed)
[facility] [place] NAME: [name] Goodman    MR#:  [id number]  DATE OF BIRTH:  Apr 17, 1941  DATE OF ADMISSION:  02/22/2017 ADMITTING PHYSICIAN: Hillary Bow, MD  DATE OF DISCHARGE: 02/23/2017 11:09 AM  PRIMARY CARE PHYSICIAN: Maryland Pink, MD   ADMISSION DIAGNOSIS:  Dehydration [E86.0] Symptomatic bradycardia [R00.1]  DISCHARGE DIAGNOSIS:  Active Problems:   Sinus bradycardia   SECONDARY DIAGNOSIS:   Past Medical History:  Diagnosis Date  . Arthritis   . History of kidney stones   . Hypertension   . Nocturia      ADMITTING HISTORY   HISTORY OF PRESENT ILLNESS:  [name] Goodman  is a 76 y.o. male with a known history of Hypertension presents to the hospital complaining of dizziness, weakness and nausea. Patient was out golfing in the sun yesterday and did not drink enough water. Today he woke up in the morning and had to stagger to the bathroom got dizzy and nauseous. He did feel better after getting fluids. He was orthostatic with EMS. In the emergency room his heart rate went into the 30s with symptoms of nausea and dizziness. Patient is on Cardizem which is not new medication. Reviewing his primary care physician's office notes his heart rate has fluctuated between 60-70. EKG shows sinus bradycardia at 50. And troponin normal. Potassium 3.2.   HOSPITAL COURSE:   * Sinus bradycardia due to Cardizem. Patient had no AV block on EKG or telemetry. Initially heart rate was in the 40s and occasionally dipped into the 30s. This was symptomatic. Patient was admitted to telemetry floor and Cardizem stopped. Slowly his bradycardia improved and on the day of discharge his heart rate in the high 50s and 60s. Asymptomatic. Refer to cardiology as outpatient.  * Hypertension. Continue home medications except Cardizem. This was replaced by Norvasc. He has been given a prescription for blood pressure machine with which he will check his  blood pressure daily and follow-up with his regular physician in one week.  * Dehydration and hypokalemia due to decreased oral intake causing dizziness. This resolved with IV fluids and potassium replacement per  Stable for discharge home.  CONSULTS OBTAINED:    DRUG ALLERGIES:  No Known Allergies  DISCHARGE MEDICATIONS:   Discharge Medication List as of 02/23/2017 11:02 AM    START taking these medications   Details  amLODipine (NORVASC) 2.5 MG tablet Take 1 tablet (2.5 mg total) by mouth daily., Starting Mon 02/23/2017, Normal    Blood Pressure KIT 1 Units by Does not apply route daily. Check blood pressure once daily, Starting Mon 02/23/2017, Print      CONTINUE these medications which have NOT CHANGED   Details  aspirin EC 81 MG tablet Take 81 mg by mouth daily., Historical Med    atorvastatin (LIPITOR) 10 MG tablet Take 10 mg by mouth daily., Until Discontinued, Historical Med    fosinopril (MONOPRIL) 20 MG tablet Take 20 mg by mouth every morning., Until Discontinued, Historical Med    hydrochlorothiazide (HYDRODIURIL) 25 MG tablet Take 25 mg by mouth every morning., Until Discontinued, Historical Med    methocarbamol (ROBAXIN) 500 MG tablet Take 1 tablet (500 mg total) by mouth every 6 (six) hours as needed for muscle spasms., Starting Tue 07/24/2015, Print      STOP taking these medications     apixaban (ELIQUIS) 2.5 MG TABS tablet      diltiazem (TIAZAC) 240 MG 24 hr capsule  oxyCODONE (OXY IR/ROXICODONE) 5 MG immediate release tablet      traMADol (ULTRAM) 50 MG tablet         Today   VITAL SIGNS:  Blood pressure (!) 150/78, pulse (!) 51, temperature 98.4 F (36.9 C), temperature source Oral, resp. rate 16, height '6\' 1"'  (1.854 m), weight 97.5 kg (214 lb 14.4 oz), SpO2 98 %.  I/O:   Intake/Output Summary (Last 24 hours) at 02/23/17 1341 Last data filed at 02/23/17 0931  Gross per 24 hour  Intake              480 ml  Output              950 ml  Net              -470 ml    PHYSICAL EXAMINATION:  Physical Exam  GENERAL:  77 y.o.-year-old patient lying in the bed with no acute distress.  LUNGS: Normal breath sounds bilaterally, no wheezing, rales,rhonchi or crepitation. No use of accessory muscles of respiration.  CARDIOVASCULAR: S1, S2 normal. No murmurs, rubs, or gallops.  ABDOMEN: Soft, non-tender, non-distended. Bowel sounds present. No organomegaly or mass.  NEUROLOGIC: Moves all 4 extremities. PSYCHIATRIC: The patient is alert and oriented x 3.  SKIN: No obvious rash, lesion, or ulcer.   DATA REVIEW:   CBC  Recent Labs Lab 02/22/17 0619  WBC 4.3  HGB 16.0  HCT 47.1  PLT 137*    Chemistries   Recent Labs Lab 02/22/17 0619 02/23/17 0410  NA 140 141  K 3.2* 3.5  CL 104 105  CO2 28 29  GLUCOSE 138* 115*  BUN 32* 27*  CREATININE 1.11 1.23  CALCIUM 9.0 8.8*  MG 1.9  --   AST 21  --   ALT 22  --   ALKPHOS 45  --   BILITOT 1.0  --     Cardiac Enzymes  Recent Labs Lab 02/22/17 1754  TROPONINI <0.03    Microbiology Results  Results for orders placed or performed during the hospital encounter of 07/17/15  Surgical pcr screen     Status: None   Collection Time: 07/17/15 12:00 PM  Result Value Ref Range Status   MRSA, PCR NEGATIVE NEGATIVE Final   Staphylococcus aureus NEGATIVE NEGATIVE Final    Comment:        The Xpert SA Assay (FDA approved for NASAL specimens in patients over 28 years of age), is one component of a comprehensive surveillance program.  Test performance has been validated by [facility] for patients greater than or equal to 2 year old. It is not intended to diagnose infection nor to guide or monitor treatment.     RADIOLOGY:  No results found.  Follow up with PCP in 1 week.  Management plans discussed with the patient, family and they are in agreement.  CODE STATUS:     Code Status Orders        Start     Ordered   02/22/17 0946  Full code  Continuous      02/22/17 0946    Code Status History    Date Active Date Inactive Code Status Order ID Comments User Context   07/23/2015 10:02 AM 07/24/2015  6:24 PM Full Code [id number]  Gaynelle Arabian, MD Inpatient    Advance Directive Documentation     Most Recent Value  Type of Advance Directive  Healthcare Power of Attorney  Pre-existing out of facility DNR order (yellow form or pink  MOST form)  -  "MOST" Form in Place?  -      TOTAL TIME TAKING CARE OF THIS PATIENT ON DAY OF DISCHARGE: more than 30 minutes.   Hillary Bow R M.D on 02/23/2017 at 1:41 PM  Between 7am to 6pm - Pager - [phone]  After 6pm go to [email or web address] - password [email or web address] [facility]  Office  [phone]  CC: Primary care physician; Maryland Pink, MD  Note: This dictation was prepared with Dragon dictation along with smaller phrase technology. Any transcriptional errors that result from this process are unintentional.

## 2017-02-23 NOTE — Care Management Note (Signed)
Case Management Note  Patient Details  Name: [name] Goodman MRN: [id number] Date of Birth: 06/26/1941  Subjective/Objective:                 Placed in observation for dizziness thought to be related to bradycardia from patient's cardizem.  Independent in all adls, no issues accessing medical care, obtaining medications or with transportation.   No discharge needs identified at present by care manager or members of care team   Action/Plan:   Expected Discharge Date:                  Expected Discharge Plan:     In-House Referral:     Discharge planning Services     Post Acute Care Choice:    Choice offered to:     DME Arranged:    DME Agency:     HH Arranged:    HH Agency:     Status of Service:     If discussed at [employer] Meetings, dates discussed:    Additional Comments:  Eber HongGreene, Hildegarde Dunaway R, RN 02/23/2017, 9:22 AM

## 2017-03-04 DIAGNOSIS — M109 Gout, unspecified: Secondary | ICD-10-CM | POA: Diagnosis not present

## 2017-03-04 DIAGNOSIS — I10 Essential (primary) hypertension: Secondary | ICD-10-CM | POA: Diagnosis not present

## 2017-03-04 DIAGNOSIS — R001 Bradycardia, unspecified: Secondary | ICD-10-CM | POA: Diagnosis not present

## 2017-03-04 DIAGNOSIS — E860 Dehydration: Secondary | ICD-10-CM | POA: Diagnosis not present

## 2017-04-16 ENCOUNTER — Ambulatory Visit: Payer: PPO | Admitting: Cardiovascular Disease

## 2017-11-24 ENCOUNTER — Ambulatory Visit
Admission: RE | Admit: 2017-11-24 | Discharge: 2017-11-24 | Disposition: A | Discharge status: Home / Self Care | Payer: PPO | Source: Ambulatory Visit | Attending: Urology | Admitting: Urology

## 2017-11-24 ENCOUNTER — Encounter: Payer: Self-pay | Admitting: Urology

## 2017-11-24 ENCOUNTER — Ambulatory Visit (INDEPENDENT_AMBULATORY_CARE_PROVIDER_SITE_OTHER): Payer: Self-pay | Admitting: Urology

## 2017-11-24 VITALS — BP 172/92 | HR 61 | Ht 73.0 in | Wt 218.4 lb

## 2017-11-24 DIAGNOSIS — N200 Calculus of kidney: Secondary | ICD-10-CM

## 2017-11-24 DIAGNOSIS — N400 Enlarged prostate without lower urinary tract symptoms: Secondary | ICD-10-CM | POA: Insufficient documentation

## 2017-11-24 NOTE — Progress Notes (Signed)
11/24/2017 12:01 PM   [name] Goodman 29-May-1941 [id number]  Referring provider: No referring provider defined for this encounter.  Chief Complaint  Patient presents with  . Follow-up    HPI: 77 year old male with a long history of nephrolithiasis and BPH.  I last saw him at [facility] on 08/26/2016.  He has left lower pole renal calculi and has opted for surveillance the past several years.  He remains asymptomatic.  Denies flank, abdominal, pelvic or scrotal pain.  He denies gross hematuria or dysuria.  He thinks he may have passed some "dried blood" approximately 1 month ago but this has not recurred.  He is status post TURP in January 2015 for urinary retention.  He is voiding without problems.   PMH: Past Medical History:  Diagnosis Date  . Arthritis   . History of kidney stones   . Hypertension   . Nocturia     Surgical History: Past Surgical History:  Procedure Laterality Date  . APPENDECTOMY    . KNEE ARTHROSCOPY    . POLYPECTOMY    . TOTAL KNEE ARTHROPLASTY Left 07/23/2015   Procedure: LEFT TOTAL KNEE ARTHROPLASTY;  Surgeon: Gaynelle Arabian, MD;  Location: [facility];  Service: Orthopedics;  Laterality: Left;  . TRANSURETHRAL RESECTION OF PROSTATE  ?2015    Home Medications:  Allergies as of 11/24/2017   No Known Allergies     Medication List        Accurate as of 11/24/17 12:01 PM. Always use your most recent med list.          amLODipine 2.5 MG tablet Commonly known as:  NORVASC Take 1 tablet (2.5 mg total) by mouth daily.   aspirin EC 81 MG tablet Take 81 mg by mouth daily.   atorvastatin 10 MG tablet Commonly known as:  LIPITOR Take 10 mg by mouth daily.   Blood Pressure Kit 1 Units by Does not apply route daily. Check blood pressure once daily   fosinopril 20 MG tablet Commonly known as:  MONOPRIL Take 20 mg by mouth every morning.   hydrochlorothiazide 25 MG tablet Commonly known as:  HYDRODIURIL Take 25 mg by mouth every morning.        Allergies: No Known Allergies  Family History: Family History  Problem Relation Age of Onset  . Hypertension Father   . Cancer Father     Social History:  reports that he quit smoking about 42 years ago. he has never used smokeless tobacco. He reports that he does not drink alcohol or use drugs.  ROS: UROLOGY Frequent Urination?: No Hard to postpone urination?: No Burning/pain with urination?: No Get up at night to urinate?: No Leakage of urine?: No Urine stream starts and stops?: No Trouble starting stream?: No Do you have to strain to urinate?: No Blood in urine?: No Urinary tract infection?: No Sexually transmitted disease?: No Injury to kidneys or bladder?: No Painful intercourse?: No Weak stream?: No Erection problems?: No Penile pain?: No  Gastrointestinal Nausea?: No Vomiting?: No Indigestion/heartburn?: No Diarrhea?: No Constipation?: No  Constitutional Fever: No Night sweats?: No Weight loss?: No Fatigue?: No  Skin Skin rash/lesions?: No Itching?: No  Eyes Blurred vision?: No Double vision?: No  Ears/Nose/Throat Sore throat?: No Sinus problems?: No  Hematologic/Lymphatic Swollen glands?: No Easy bruising?: No  Cardiovascular Leg swelling?: No Chest pain?: No  Respiratory Cough?: No Shortness of breath?: No  Endocrine Excessive thirst?: No  Musculoskeletal Back pain?: No Joint pain?: No  Neurological Headaches?: No Dizziness?: No  Psychologic Depression?: No Anxiety?: No  Physical Exam: BP (!) 172/92 (BP Location: Right Arm, Patient Position: Sitting, Cuff Size: Large)   Pulse 61   Ht '6\' 1"'  (1.854 m)   Wt 218 lb 6.4 oz (99.1 kg)   BMI 28.81 kg/m   Constitutional:  Alert and oriented, No acute distress. HEENT: [place] AT, moist mucus membranes.  Trachea midline, no masses. Cardiovascular: No clubbing, cyanosis, or edema. Respiratory: Normal respiratory effort, no increased work of breathing. GI: Abdomen is soft,  nontender, nondistended, no abdominal masses GU: No CVA tenderness.  Skin: No rashes, bruises or suspicious lesions. Lymph: No cervical or inguinal adenopathy. Neurologic: Grossly intact, no focal deficits, moving all 4 extremities. Psychiatric: Normal mood and affect.  Laboratory Data: Lab Results  Component Value Date   WBC 4.3 02/22/2017   HGB 16.0 02/22/2017   HCT 47.1 02/22/2017   MCV 91.8 02/22/2017   PLT 137 (L) 02/22/2017    Lab Results  Component Value Date   CREATININE 1.23 02/23/2017    Urinalysis Lab Results  Component Value Date   APPEARANCEUR CLEAR (A) 02/22/2017   LEUKOCYTESUR NEGATIVE 02/22/2017   PROTEINUR NEGATIVE 02/22/2017   GLUCOSEU NEGATIVE 02/22/2017   RBCU 0-5 02/22/2017   BILIRUBINUR NEGATIVE 02/22/2017   NITRITE NEGATIVE 02/22/2017    Lab Results  Component Value Date   BACTERIA NONE SEEN 02/22/2017     Assessment & Plan:    1. Nephrolithiasis He was sent for a KUB and will be notified with results.  Follow-up annually.  He was instructed to call for development of gross hematuria.  - Abdomen 1 view (KUB); Future  Return in about 1 year (around 11/25/2018) for [place], [place].  Abbie Sons, [place] [address], [place], [place] [address] [phone]

## 2017-11-27 ENCOUNTER — Telehealth: Payer: Self-pay

## 2017-11-27 DIAGNOSIS — N200 Calculus of kidney: Secondary | ICD-10-CM

## 2017-11-27 NOTE — Telephone Encounter (Signed)
-----   Message from Riki AltesScott C Stoioff, MD sent at 11/26/2017  8:23 AM EST ----- KUB shows stable left lower pole calculi.  There is a calcification in the midportion of the left kidney not present last year.  Recommend a repeat KUB in 6 months.

## 2017-11-27 NOTE — Telephone Encounter (Signed)
Letter sent and orders placed.

## 2017-11-30 DIAGNOSIS — I10 Essential (primary) hypertension: Secondary | ICD-10-CM | POA: Diagnosis not present

## 2017-11-30 DIAGNOSIS — Z0000 Encounter for general adult medical examination without abnormal findings: Secondary | ICD-10-CM | POA: Diagnosis not present

## 2017-11-30 DIAGNOSIS — R5381 Other malaise: Secondary | ICD-10-CM | POA: Diagnosis not present

## 2017-11-30 DIAGNOSIS — R5383 Other fatigue: Secondary | ICD-10-CM | POA: Diagnosis not present

## 2017-11-30 DIAGNOSIS — Z23 Encounter for immunization: Secondary | ICD-10-CM | POA: Diagnosis not present

## 2017-11-30 DIAGNOSIS — Z125 Encounter for screening for malignant neoplasm of prostate: Secondary | ICD-10-CM | POA: Diagnosis not present

## 2017-11-30 DIAGNOSIS — E785 Hyperlipidemia, unspecified: Secondary | ICD-10-CM | POA: Diagnosis not present

## 2017-12-04 DIAGNOSIS — E538 Deficiency of other specified B group vitamins: Secondary | ICD-10-CM | POA: Diagnosis not present

## 2017-12-10 DIAGNOSIS — E538 Deficiency of other specified B group vitamins: Secondary | ICD-10-CM | POA: Diagnosis not present

## 2017-12-17 DIAGNOSIS — E538 Deficiency of other specified B group vitamins: Secondary | ICD-10-CM | POA: Diagnosis not present

## 2018-11-24 ENCOUNTER — Encounter: Payer: Self-pay | Admitting: Urology

## 2018-11-24 ENCOUNTER — Ambulatory Visit
Admission: RE | Admit: 2018-11-24 | Discharge: 2018-11-24 | Disposition: A | Discharge status: Home / Self Care | Payer: PPO | Source: Ambulatory Visit | Attending: Urology | Admitting: Urology

## 2018-11-24 ENCOUNTER — Ambulatory Visit: Payer: PPO | Admitting: Urology

## 2018-11-24 ENCOUNTER — Other Ambulatory Visit: Payer: Self-pay

## 2018-11-24 VITALS — BP 144/89 | HR 73 | Ht 72.0 in | Wt 220.0 lb

## 2018-11-24 DIAGNOSIS — N200 Calculus of kidney: Secondary | ICD-10-CM | POA: Insufficient documentation

## 2018-11-24 NOTE — Progress Notes (Signed)
11/24/2018 10:54 AM   [name] Goodman [birth year] [id number]  Referring provider: Maryland Pink, MD [address] [facility] [place], [place] [address]  Chief Complaint  Patient presents with  . Nephrolithiasis   Urologic history: 1.  BPH with urinary retention  -Status post TURP 02/2014  2.  Left nephrolithiasis  -Nonobstructing lower pole renal calculi  -Prior ureteroscopy/lithotripsy   HPI: 78 year old male presents for annual follow-up.  Since his visit last year he states he has been doing well.  He denies bothersome lower urinary tract symptoms.  Denies dysuria, gross hematuria or flank/abdominal/pelvic/scrotal pain.   PMH: Past Medical History:  Diagnosis Date  . Arthritis   . History of kidney stones   . Hypertension   . Nocturia     Surgical History: Past Surgical History:  Procedure Laterality Date  . APPENDECTOMY    . KNEE ARTHROSCOPY    . POLYPECTOMY    . TOTAL KNEE ARTHROPLASTY Left 07/23/2015   Procedure: LEFT TOTAL KNEE ARTHROPLASTY;  Surgeon: Gaynelle Arabian, MD;  Location: [facility];  Service: Orthopedics;  Laterality: Left;  . TRANSURETHRAL RESECTION OF PROSTATE  ?2015    Home Medications:  Allergies as of 11/24/2018   No Known Allergies     Medication List       Accurate as of November 24, 2018 10:54 AM. Always use your most recent med list.        amLODipine 2.5 MG tablet Commonly known as:  NORVASC Take 1 tablet (2.5 mg total) by mouth daily.   aspirin EC 81 MG tablet Take 81 mg by mouth daily.   atorvastatin 10 MG tablet Commonly known as:  LIPITOR Take 10 mg by mouth daily.   Blood Pressure Kit 1 Units by Does not apply route daily. Check blood pressure once daily   fosinopril 20 MG tablet Commonly known as:  MONOPRIL Take 20 mg by mouth every morning.   hydrochlorothiazide 25 MG tablet Commonly known as:  HYDRODIURIL Take 25 mg by mouth every morning.       Allergies: No Known Allergies  Family  History: Family History  Problem Relation Age of Onset  . Hypertension Father   . Cancer Father     Social History:  reports that he quit smoking about 43 years ago. He has never used smokeless tobacco. He reports that he does not drink alcohol or use drugs.  ROS: UROLOGY Frequent Urination?: No Hard to postpone urination?: No Burning/pain with urination?: No Get up at night to urinate?: No Leakage of urine?: No Urine stream starts and stops?: No Trouble starting stream?: No Do you have to strain to urinate?: No Blood in urine?: No Urinary tract infection?: No Sexually transmitted disease?: No Injury to kidneys or bladder?: No Painful intercourse?: No Weak stream?: No Erection problems?: No Penile pain?: No  Gastrointestinal Nausea?: No Vomiting?: No Indigestion/heartburn?: No Diarrhea?: No Constipation?: No  Constitutional Fever: No Night sweats?: No Weight loss?: No Fatigue?: No  Skin Skin rash/lesions?: No Itching?: No  Eyes Blurred vision?: No Double vision?: No  Ears/Nose/Throat Sore throat?: No Sinus problems?: No  Hematologic/Lymphatic Swollen glands?: No Easy bruising?: No  Cardiovascular Leg swelling?: No Chest pain?: No  Respiratory Cough?: No Shortness of breath?: No  Endocrine Excessive thirst?: No  Musculoskeletal Back pain?: No Joint pain?: No  Neurological Headaches?: No Dizziness?: No  Psychologic Depression?: No Anxiety?: No  Physical Exam: BP (!) 144/89   Pulse 73   Ht 6' (1.829 m)  Wt 220 lb (99.8 kg)   BMI 29.84 kg/m   Constitutional:  Alert and oriented, No acute distress. HEENT: [place] AT, moist mucus membranes.  Trachea midline, no masses. Cardiovascular: No clubbing, cyanosis, or edema. Respiratory: Normal respiratory effort, no increased work of breathing. Skin: No rashes, bruises or suspicious lesions. Neurologic: Grossly intact, no focal deficits, moving all 4 extremities. Psychiatric: Normal mood and  affect.   Assessment & Plan:   78 year old male with history of BPH doing well status post TURP in 2015. Nonobstructing left renal calculi which are asymptomatic. KUB was ordered and he will be notified with results.  If stable continue annual follow-up.  Return in about 1 year (around 11/24/2019) for Recheck, KUB.  Abbie Sons, [place] [address] [place],  [address] [phone]

## 2018-11-25 ENCOUNTER — Telehealth: Payer: Self-pay | Admitting: Family Medicine

## 2018-11-25 NOTE — Telephone Encounter (Signed)
LMOM informing patient of result.

## 2018-11-25 NOTE — Telephone Encounter (Signed)
-----   Message from Riki Altes, MD sent at 11/25/2018  9:29 AM EST ----- KUB shows stable left lower pole calculi

## 2018-11-26 ENCOUNTER — Encounter: Payer: Self-pay | Admitting: Urology

## 2018-12-02 DIAGNOSIS — I10 Essential (primary) hypertension: Secondary | ICD-10-CM | POA: Diagnosis not present

## 2018-12-02 DIAGNOSIS — E538 Deficiency of other specified B group vitamins: Secondary | ICD-10-CM | POA: Diagnosis not present

## 2018-12-02 DIAGNOSIS — Z0000 Encounter for general adult medical examination without abnormal findings: Secondary | ICD-10-CM | POA: Diagnosis not present

## 2018-12-02 DIAGNOSIS — E785 Hyperlipidemia, unspecified: Secondary | ICD-10-CM | POA: Diagnosis not present

## 2018-12-02 DIAGNOSIS — Z125 Encounter for screening for malignant neoplasm of prostate: Secondary | ICD-10-CM | POA: Diagnosis not present

## 2019-03-01 DIAGNOSIS — E538 Deficiency of other specified B group vitamins: Secondary | ICD-10-CM | POA: Diagnosis not present

## 2019-06-23 DIAGNOSIS — R0789 Other chest pain: Secondary | ICD-10-CM | POA: Diagnosis not present

## 2019-06-23 DIAGNOSIS — I10 Essential (primary) hypertension: Secondary | ICD-10-CM | POA: Diagnosis not present

## 2019-06-23 DIAGNOSIS — E782 Mixed hyperlipidemia: Secondary | ICD-10-CM | POA: Diagnosis not present

## 2019-07-05 DIAGNOSIS — E785 Hyperlipidemia, unspecified: Secondary | ICD-10-CM | POA: Diagnosis not present

## 2019-07-05 DIAGNOSIS — R001 Bradycardia, unspecified: Secondary | ICD-10-CM | POA: Diagnosis not present

## 2019-07-05 DIAGNOSIS — I10 Essential (primary) hypertension: Secondary | ICD-10-CM | POA: Diagnosis not present

## 2019-11-24 ENCOUNTER — Other Ambulatory Visit: Payer: Self-pay

## 2019-11-24 ENCOUNTER — Encounter: Payer: Self-pay | Admitting: Urology

## 2019-11-24 ENCOUNTER — Ambulatory Visit
Admission: RE | Admit: 2019-11-24 | Discharge: 2019-11-24 | Disposition: A | Discharge status: Home / Self Care | Payer: PPO | Source: Ambulatory Visit | Attending: Urology | Admitting: Urology

## 2019-11-24 ENCOUNTER — Ambulatory Visit: Payer: PPO | Admitting: Urology

## 2019-11-24 VITALS — BP 165/81 | HR 67 | Ht 73.0 in | Wt 215.0 lb

## 2019-11-24 DIAGNOSIS — N200 Calculus of kidney: Secondary | ICD-10-CM

## 2019-11-24 NOTE — Progress Notes (Signed)
   11/24/2019 9:20 AM   [name] Goodman 1940-12-10 [id number]  Referring provider: Maryland Pink, MD [address] [facility] [place],  [place] [address]  Chief Complaint  Patient presents with  . Nephrolithiasis     Urologic history: 1.  BPH with urinary retention             -Status post TURP 02/2014  2.  Left nephrolithiasis             -Nonobstructing lower pole renal calculi             -Prior ureteroscopy/lithotripsy   HPI: 79 y.o. male presents for annual follow-up.  -Doing well, no bothersome LUTS -Denies dysuria, gross hematuria -No flank, abdominal or pelvic pain   PMH: Past Medical History:  Diagnosis Date  . Arthritis   . History of kidney stones   . Hypertension   . Nocturia     Surgical History: Past Surgical History:  Procedure Laterality Date  . APPENDECTOMY    . KNEE ARTHROSCOPY    . POLYPECTOMY    . TOTAL KNEE ARTHROPLASTY Left 07/23/2015   Procedure: LEFT TOTAL KNEE ARTHROPLASTY;  Surgeon: Gaynelle Arabian, MD;  Location: [facility];  Service: Orthopedics;  Laterality: Left;  . TRANSURETHRAL RESECTION OF PROSTATE  ?2015    Home Medications:  Allergies as of 11/24/2019   No Known Allergies     Medication List       Accurate as of November 24, 2019  9:20 AM. If you have any questions, ask your nurse or doctor.        amLODipine 2.5 MG tablet Commonly known as: NORVASC Take 1 tablet (2.5 mg total) by mouth daily.   aspirin EC 81 MG tablet Take 81 mg by mouth daily.   atorvastatin 10 MG tablet Commonly known as: LIPITOR Take 10 mg by mouth daily.   Blood Pressure Kit 1 Units by Does not apply route daily. Check blood pressure once daily   diltiazem 240 MG 24 hr capsule Commonly known as: TIAZAC Take 240 mg by mouth daily.   fosinopril 20 MG tablet Commonly known as: MONOPRIL Take 20 mg by mouth every morning.   hydrochlorothiazide 25 MG tablet Commonly known as: HYDRODIURIL Take 25 mg by mouth every morning.       Allergies: No Known Allergies  Family History: Family History  Problem Relation Age of Onset  . Hypertension Father   . Cancer Father     Social History:  reports that he quit smoking about 44 years ago. He has never used smokeless tobacco. He reports that he does not drink alcohol or use drugs.   Physical Exam: BP (!) 165/81 (BP Location: Left Arm, Patient Position: Sitting, Cuff Size: Large)   Pulse 67   Ht '6\' 1"'$  (1.854 m)   Wt 215 lb (97.5 kg)   BMI 28.37 kg/m   Constitutional:  Alert and oriented, No acute distress. HEENT: [place] AT, moist mucus membranes.  Trachea midline, no masses. Cardiovascular: No clubbing, cyanosis, or edema. Respiratory: Normal respiratory effort, no increased work of breathing.    Assessment & Plan:    - Nephrolithiasis Did not have his KUB this morning and will have it done when he leaves the office.  Will call with results.  If stable continue annual visit/KUB.   Abbie Sons, [place] [address] [place], [place] [address] [phone]

## 2019-11-25 ENCOUNTER — Telehealth: Payer: Self-pay | Admitting: *Deleted

## 2019-11-25 NOTE — Telephone Encounter (Signed)
-----   Message from Riki Altes, MD sent at 11/25/2019  7:52 AM EST ----- KUB reviewed and left renal calculi are stable.  Continue annual follow-up.

## 2019-11-25 NOTE — Telephone Encounter (Signed)
Notified patient as instructed, patient pleased. Discussed follow-up appointments, patient agrees  

## 2019-12-06 DIAGNOSIS — Z23 Encounter for immunization: Secondary | ICD-10-CM | POA: Diagnosis not present

## 2019-12-06 DIAGNOSIS — Z125 Encounter for screening for malignant neoplasm of prostate: Secondary | ICD-10-CM | POA: Diagnosis not present

## 2019-12-06 DIAGNOSIS — Z0000 Encounter for general adult medical examination without abnormal findings: Secondary | ICD-10-CM | POA: Diagnosis not present

## 2019-12-06 DIAGNOSIS — E538 Deficiency of other specified B group vitamins: Secondary | ICD-10-CM | POA: Diagnosis not present

## 2019-12-06 DIAGNOSIS — E785 Hyperlipidemia, unspecified: Secondary | ICD-10-CM | POA: Diagnosis not present

## 2019-12-06 DIAGNOSIS — I10 Essential (primary) hypertension: Secondary | ICD-10-CM | POA: Diagnosis not present

## 2019-12-08 DIAGNOSIS — H2513 Age-related nuclear cataract, bilateral: Secondary | ICD-10-CM | POA: Diagnosis not present

## 2020-03-01 DIAGNOSIS — I10 Essential (primary) hypertension: Secondary | ICD-10-CM | POA: Diagnosis not present

## 2020-03-01 DIAGNOSIS — E785 Hyperlipidemia, unspecified: Secondary | ICD-10-CM | POA: Diagnosis not present

## 2020-03-01 DIAGNOSIS — R001 Bradycardia, unspecified: Secondary | ICD-10-CM | POA: Diagnosis not present

## 2020-03-01 DIAGNOSIS — R079 Chest pain, unspecified: Secondary | ICD-10-CM | POA: Diagnosis not present

## 2020-10-11 DIAGNOSIS — G8929 Other chronic pain: Secondary | ICD-10-CM | POA: Diagnosis not present

## 2020-10-11 DIAGNOSIS — M25561 Pain in right knee: Secondary | ICD-10-CM | POA: Diagnosis not present

## 2020-10-11 DIAGNOSIS — M1711 Unilateral primary osteoarthritis, right knee: Secondary | ICD-10-CM | POA: Diagnosis not present

## 2020-10-17 DIAGNOSIS — M1711 Unilateral primary osteoarthritis, right knee: Secondary | ICD-10-CM | POA: Diagnosis not present

## 2020-10-17 DIAGNOSIS — G8929 Other chronic pain: Secondary | ICD-10-CM | POA: Diagnosis not present

## 2020-10-17 DIAGNOSIS — M25561 Pain in right knee: Secondary | ICD-10-CM | POA: Diagnosis not present

## 2020-10-17 DIAGNOSIS — H2513 Age-related nuclear cataract, bilateral: Secondary | ICD-10-CM | POA: Diagnosis not present

## 2020-10-17 DIAGNOSIS — H35371 Puckering of macula, right eye: Secondary | ICD-10-CM | POA: Diagnosis not present

## 2020-10-26 ENCOUNTER — Other Ambulatory Visit: Payer: Self-pay | Admitting: Orthopedic Surgery

## 2020-10-26 ENCOUNTER — Other Ambulatory Visit (HOSPITAL_COMMUNITY): Payer: Self-pay | Admitting: Orthopedic Surgery

## 2020-10-26 DIAGNOSIS — M1711 Unilateral primary osteoarthritis, right knee: Secondary | ICD-10-CM | POA: Diagnosis not present

## 2020-11-04 ENCOUNTER — Other Ambulatory Visit: Payer: Self-pay

## 2020-11-04 ENCOUNTER — Ambulatory Visit
Admission: RE | Admit: 2020-11-04 | Discharge: 2020-11-04 | Disposition: A | Discharge status: Home / Self Care | Payer: PPO | Source: Ambulatory Visit | Attending: Orthopedic Surgery | Admitting: Orthopedic Surgery

## 2020-11-04 DIAGNOSIS — M1711 Unilateral primary osteoarthritis, right knee: Secondary | ICD-10-CM | POA: Insufficient documentation

## 2020-11-04 DIAGNOSIS — M25561 Pain in right knee: Secondary | ICD-10-CM | POA: Diagnosis not present

## 2020-11-05 ENCOUNTER — Other Ambulatory Visit: Payer: Self-pay | Admitting: Orthopedic Surgery

## 2020-11-09 ENCOUNTER — Other Ambulatory Visit: Payer: Self-pay

## 2020-11-09 ENCOUNTER — Encounter
Admission: RE | Admit: 2020-11-09 | Discharge: 2020-11-09 | Disposition: A | Discharge status: Home / Self Care | Payer: PPO | Source: Ambulatory Visit | Attending: Orthopedic Surgery | Admitting: Orthopedic Surgery

## 2020-11-09 NOTE — Patient Instructions (Signed)
Your procedure is scheduled on: Tuesday November 13, 2020  Report to the Registration Desk on the 1st floor of the [facility] To find out your arrival time, please call [phone] between 1PM - 3PM on: November 12, 2020 MONDAY  REMEMBER: Instructions that are not followed completely may result in serious medical risk, up to and including death; or upon the discretion of your surgeon and anesthesiologist your surgery may need to be rescheduled.  Do not eat food after midnight the night before surgery.  No gum chewing, lozengers or hard candies.  You may however, drink CLEAR liquids up to 2 hours before you are scheduled to arrive for your surgery. Do not drink anything within 2 hours of your scheduled arrival time.  Clear liquids include: - water  - apple juice without pulp - gatorade (not RED, PURPLE, OR BLUE) - black coffee or tea (Do NOT add milk or creamers to the coffee or tea) Do NOT drink anything that is not on this list.  Type 1 and Type 2 diabetics should only drink water.  In addition, your doctor has ordered for you to drink the provided  Ensure Pre-Surgery Clear Carbohydrate Drink  Drinking this carbohydrate drink up to two hours before surgery helps to reduce insulin resistance and improve patient outcomes. Please complete drinking 2 hours prior to scheduled arrival time.  TAKE THESE MEDICATIONS THE MORNING OF SURGERY WITH A SIP OF WATER: ATORVASTATIN DILTIAZEM  DO NOT FOSINOPRIL AND HYDROCHLOROTHIAZIDE THE DAY OF SURGERY   STOP ASPIRIN UNTIL AFTER SURGERY   One week prior to surgery: Stop Anti-inflammatories (NSAIDS) such as Advil, Aleve, Ibuprofen, Motrin, Naproxen, Naprosyn and Aspirin based products such as Excedrin, Goodys Powder, BC Powder.    USE TYLENOL IF NEEDED  Stop ANY OVER THE COUNTER supplements until after surgery. (However, you may continue taking Vitamin D, Vitamin B, and multivitamin up until the day before surgery.)  No Alcohol for 24 hours  before or after surgery.  No Smoking including e-cigarettes for 24 hours prior to surgery.  No chewable tobacco products for at least 6 hours prior to surgery.  No nicotine patches on the day of surgery.  Do not use any "recreational" drugs for at least a week prior to your surgery.  Please be advised that the combination of cocaine and anesthesia may have negative outcomes, up to and including death. If you test positive for cocaine, your surgery will be cancelled.  On the morning of surgery brush your teeth with toothpaste and water, you may rinse your mouth with mouthwash if you wish. Do not swallow any toothpaste or mouthwash.  Do not wear jewelry, make-up, hairpins, clips or nail polish.  Do not wear lotions, powders, or perfumes.   Do not shave body from the neck down 48 hours prior to surgery just in case you cut yourself which could leave a site for infection.  Also, freshly shaved skin may become irritated if using the CHG soap.  Contact lenses, hearing aids and dentures may not be worn into surgery.  Do not bring valuables to the hospital. [facility] is not responsible for any missing/lost belongings or valuables.   Use CHG Soap as directed on instruction sheet.  Notify your doctor if there is any change in your medical condition (cold, fever, infection).  Wear comfortable clothing (specific to your surgery type) to the hospital.  Plan for stool softeners for home use; pain medications have a tendency to cause constipation. You can also help prevent  constipation by eating foods high in fiber such as fruits and vegetables and drinking plenty of fluids as your diet allows.  After surgery, you can help prevent lung complications by doing breathing exercises.  Take deep breaths and cough every 1-2 hours. Your doctor may order a device called an Incentive Spirometer to help you take deep breaths. When coughing or sneezing, hold a pillow firmly against your incision with both  hands. This is called "splinting." Doing this helps protect your incision. It also decreases belly discomfort.  If you are being discharged the day of surgery, you will not be allowed to drive home. You will need a responsible adult (18 years or older) to drive you home and stay with you that night.   Please call the Pre-admissions Testing Dept. at [phone] if you have any questions about these instructions.  Visitation Policy:  Patients undergoing a surgery or procedure may have one family member or support person with them as long as that person is not COVID-19 positive or experiencing its symptoms.  That person may remain in the waiting area during the procedure.  Inpatient Visitation:    Visiting hours are 7 a.m. to 8 p.m. Patients will be allowed one visitor. The visitor may change daily. The visitor must pass COVID-19 screenings, use hand sanitizer when entering and exiting the patient's room and wear a mask at all times, including in the patient's room. Patients must also wear a mask when staff or their visitor are in the room. Masking is required regardless of vaccination status. Systemwide, no visitors 17 or younger.

## 2020-11-12 ENCOUNTER — Encounter
Admission: RE | Admit: 2020-11-12 | Discharge: 2020-11-12 | Disposition: A | Discharge status: Home / Self Care | Payer: PPO | Source: Ambulatory Visit | Attending: Orthopedic Surgery | Admitting: Orthopedic Surgery

## 2020-11-12 ENCOUNTER — Other Ambulatory Visit: Payer: Self-pay

## 2020-11-12 DIAGNOSIS — I10 Essential (primary) hypertension: Secondary | ICD-10-CM | POA: Insufficient documentation

## 2020-11-12 DIAGNOSIS — Z20822 Contact with and (suspected) exposure to covid-19: Secondary | ICD-10-CM | POA: Insufficient documentation

## 2020-11-12 DIAGNOSIS — Z01818 Encounter for other preprocedural examination: Secondary | ICD-10-CM | POA: Insufficient documentation

## 2020-11-12 LAB — BASIC METABOLIC PANEL
Anion gap: 9 (ref 5–15)
BUN: 23 mg/dL (ref 8–23)
CO2: 29 mmol/L (ref 22–32)
Calcium: 9.6 mg/dL (ref 8.9–10.3)
Chloride: 101 mmol/L (ref 98–111)
Creatinine, Ser: 1.3 mg/dL — ABNORMAL HIGH (ref 0.61–1.24)
GFR, Estimated: 56 mL/min — ABNORMAL LOW (ref >60)
Glucose, Bld: 144 mg/dL — ABNORMAL HIGH (ref 70–99)
Potassium: 3.6 mmol/L (ref 3.5–5.1)
Sodium: 139 mmol/L (ref 135–145)

## 2020-11-12 LAB — SARS CORONAVIRUS 2 (TAT 6-24 HRS): SARS Coronavirus 2: NEGATIVE

## 2020-11-12 LAB — CBC
HCT: 48.5 % (ref 39.0–52.0)
Hemoglobin: 16.5 g/dL (ref 13.0–17.0)
MCH: 30.2 pg (ref 26.0–34.0)
MCHC: 34 g/dL (ref 30.0–36.0)
MCV: 88.8 fL (ref 80.0–100.0)
Platelets: 184 10*3/uL (ref 150–400)
RBC: 5.46 MIL/uL (ref 4.22–5.81)
RDW: 13.4 % (ref 11.5–15.5)
WBC: 5.1 10*3/uL (ref 4.0–10.5)
nRBC: 0 % (ref 0.0–0.2)

## 2020-11-13 ENCOUNTER — Encounter
Admission: RE | Disposition: A | Discharge status: Home / Self Care | Payer: Self-pay | Source: Home / Self Care | Attending: Orthopedic Surgery

## 2020-11-13 ENCOUNTER — Other Ambulatory Visit: Payer: Self-pay

## 2020-11-13 ENCOUNTER — Encounter: Payer: Self-pay | Admitting: Orthopedic Surgery

## 2020-11-13 ENCOUNTER — Ambulatory Visit: Payer: PPO | Admitting: Urgent Care

## 2020-11-13 ENCOUNTER — Ambulatory Visit
Admission: RE | Admit: 2020-11-13 | Discharge: 2020-11-13 | Disposition: A | Discharge status: Home / Self Care | Payer: PPO | Attending: Orthopedic Surgery | Admitting: Orthopedic Surgery

## 2020-11-13 DIAGNOSIS — Z79899 Other long term (current) drug therapy: Secondary | ICD-10-CM | POA: Diagnosis not present

## 2020-11-13 DIAGNOSIS — Z87891 Personal history of nicotine dependence: Secondary | ICD-10-CM | POA: Insufficient documentation

## 2020-11-13 DIAGNOSIS — M1711 Unilateral primary osteoarthritis, right knee: Secondary | ICD-10-CM | POA: Insufficient documentation

## 2020-11-13 DIAGNOSIS — M23203 Derangement of unspecified medial meniscus due to old tear or injury, right knee: Secondary | ICD-10-CM | POA: Diagnosis not present

## 2020-11-13 DIAGNOSIS — R001 Bradycardia, unspecified: Secondary | ICD-10-CM | POA: Diagnosis not present

## 2020-11-13 DIAGNOSIS — N200 Calculus of kidney: Secondary | ICD-10-CM | POA: Diagnosis not present

## 2020-11-13 DIAGNOSIS — X58XXXA Exposure to other specified factors, initial encounter: Secondary | ICD-10-CM | POA: Diagnosis not present

## 2020-11-13 DIAGNOSIS — Z7982 Long term (current) use of aspirin: Secondary | ICD-10-CM | POA: Insufficient documentation

## 2020-11-13 DIAGNOSIS — S83281A Other tear of lateral meniscus, current injury, right knee, initial encounter: Secondary | ICD-10-CM | POA: Insufficient documentation

## 2020-11-13 DIAGNOSIS — N400 Enlarged prostate without lower urinary tract symptoms: Secondary | ICD-10-CM | POA: Diagnosis not present

## 2020-11-13 DIAGNOSIS — M23231 Derangement of other medial meniscus due to old tear or injury, right knee: Secondary | ICD-10-CM | POA: Diagnosis not present

## 2020-11-13 HISTORY — PX: KNEE ARTHROSCOPY WITH LATERAL MENISECTOMY: SHX6193

## 2020-11-13 SURGERY — ARTHROSCOPY, KNEE, WITH LATERAL MENISCECTOMY
Anesthesia: General | Site: Knee | Laterality: Right

## 2020-11-13 MED ORDER — CEFAZOLIN SODIUM-DEXTROSE 2-4 GM/100ML-% IV SOLN
INTRAVENOUS | Status: AC
  Filled 2020-11-13: qty 100

## 2020-11-13 MED ORDER — LIDOCAINE HCL (CARDIAC) PF 100 MG/5ML IV SOSY
PREFILLED_SYRINGE | INTRAVENOUS | Status: DC | PRN
  Administered 2020-11-13: 100 mg via INTRAVENOUS

## 2020-11-13 MED ORDER — LABETALOL HCL 5 MG/ML IV SOLN
5.0000 mg | INTRAVENOUS | Status: AC | PRN
  Administered 2020-11-13 (×2): 5 mg via INTRAVENOUS

## 2020-11-13 MED ORDER — PHENYLEPHRINE HCL (PRESSORS) 10 MG/ML IV SOLN
INTRAVENOUS | Status: DC | PRN
  Administered 2020-11-13: 100 ug via INTRAVENOUS

## 2020-11-13 MED ORDER — FENTANYL CITRATE (PF) 100 MCG/2ML IJ SOLN
INTRAMUSCULAR | Status: DC | PRN
  Administered 2020-11-13 (×2): 50 ug via INTRAVENOUS

## 2020-11-13 MED ORDER — CHLORHEXIDINE GLUCONATE 0.12 % MT SOLN: 15.0000 mL | Freq: Once | OROMUCOSAL | Status: AC

## 2020-11-13 MED ORDER — ONDANSETRON HCL 4 MG/2ML IJ SOLN
INTRAMUSCULAR | Status: DC | PRN
  Administered 2020-11-13: 4 mg via INTRAVENOUS

## 2020-11-13 MED ORDER — FENTANYL CITRATE (PF) 100 MCG/2ML IJ SOLN: 25.0000 ug | INTRAMUSCULAR | Status: DC | PRN

## 2020-11-13 MED ORDER — FAMOTIDINE 20 MG PO TABS
ORAL_TABLET | ORAL | Status: AC
  Administered 2020-11-13: 20 mg via ORAL
  Filled 2020-11-13: qty 1

## 2020-11-13 MED ORDER — LABETALOL HCL 5 MG/ML IV SOLN
INTRAVENOUS | Status: DC | PRN
  Administered 2020-11-13: 10 mg via INTRAVENOUS

## 2020-11-13 MED ORDER — LACTATED RINGERS IV SOLN: INTRAVENOUS | Status: DC

## 2020-11-13 MED ORDER — PROPOFOL 10 MG/ML IV BOLUS
INTRAVENOUS | Status: DC | PRN
  Administered 2020-11-13: 100 mg via INTRAVENOUS

## 2020-11-13 MED ORDER — HYDROCODONE-ACETAMINOPHEN 5-325 MG PO TABS
ORAL_TABLET | ORAL | Status: AC
  Filled 2020-11-13: qty 1

## 2020-11-13 MED ORDER — HYDROCODONE-ACETAMINOPHEN 5-325 MG PO TABS
1.0000 | ORAL_TABLET | Freq: Four times a day (QID) | ORAL | Status: DC | PRN
  Administered 2020-11-13: 1 via ORAL

## 2020-11-13 MED ORDER — ONDANSETRON HCL 4 MG PO TABS: 4.0000 mg | ORAL_TABLET | Freq: Four times a day (QID) | ORAL | Status: DC | PRN

## 2020-11-13 MED ORDER — FAMOTIDINE 20 MG PO TABS: 20.0000 mg | ORAL_TABLET | Freq: Once | ORAL | Status: AC

## 2020-11-13 MED ORDER — ORAL CARE MOUTH RINSE: 15.0000 mL | Freq: Once | OROMUCOSAL | Status: AC

## 2020-11-13 MED ORDER — HYDROCODONE-ACETAMINOPHEN 5-325 MG PO TABS: 1.0000 | ORAL_TABLET | Freq: Four times a day (QID) | ORAL | 0 refills | Status: DC | PRN

## 2020-11-13 MED ORDER — METOCLOPRAMIDE HCL 5 MG/ML IJ SOLN: 5.0000 mg | Freq: Three times a day (TID) | INTRAMUSCULAR | Status: DC | PRN

## 2020-11-13 MED ORDER — LABETALOL HCL 5 MG/ML IV SOLN
INTRAVENOUS | Status: AC
  Filled 2020-11-13: qty 4

## 2020-11-13 MED ORDER — ONDANSETRON HCL 4 MG/2ML IJ SOLN: 4.0000 mg | Freq: Four times a day (QID) | INTRAMUSCULAR | Status: DC | PRN

## 2020-11-13 MED ORDER — SODIUM CHLORIDE 0.9 % IV SOLN: INTRAVENOUS | Status: DC

## 2020-11-13 MED ORDER — ONDANSETRON HCL 4 MG/2ML IJ SOLN: 4.0000 mg | Freq: Once | INTRAMUSCULAR | Status: DC | PRN

## 2020-11-13 MED ORDER — CEFAZOLIN SODIUM-DEXTROSE 2-4 GM/100ML-% IV SOLN
2.0000 g | INTRAVENOUS | Status: AC
  Administered 2020-11-13: 2 g via INTRAVENOUS

## 2020-11-13 MED ORDER — CHLORHEXIDINE GLUCONATE 0.12 % MT SOLN
OROMUCOSAL | Status: AC
  Administered 2020-11-13: 15 mL via OROMUCOSAL
  Filled 2020-11-13: qty 15

## 2020-11-13 MED ORDER — METOCLOPRAMIDE HCL 10 MG PO TABS
5.0000 mg | ORAL_TABLET | Freq: Three times a day (TID) | ORAL | Status: DC | PRN
Start: 2020-11-13 — End: 2020-11-13

## 2020-11-13 MED ORDER — FENTANYL CITRATE (PF) 100 MCG/2ML IJ SOLN
INTRAMUSCULAR | Status: AC
  Filled 2020-11-13: qty 2

## 2020-11-13 MED ORDER — LABETALOL HCL 5 MG/ML IV SOLN
INTRAVENOUS | Status: AC
  Administered 2020-11-13: 5 mg via INTRAVENOUS
  Filled 2020-11-13: qty 4

## 2020-11-13 MED ORDER — EPHEDRINE SULFATE 50 MG/ML IJ SOLN
INTRAMUSCULAR | Status: DC | PRN
  Administered 2020-11-13: 10 mg via INTRAVENOUS

## 2020-11-13 MED ORDER — DEXAMETHASONE SODIUM PHOSPHATE 10 MG/ML IJ SOLN
INTRAMUSCULAR | Status: DC | PRN
  Administered 2020-11-13: 10 mg via INTRAVENOUS

## 2020-11-13 SURGICAL SUPPLY — 31 items
APL PRP STRL LF DISP 70% ISPRP (MISCELLANEOUS) ×1
BLADE INCISOR PLUS 4.5 (BLADE) IMPLANT
BNDG ELASTIC 4X5.8 VLCR STR LF (GAUZE/BANDAGES/DRESSINGS) IMPLANT
CHLORAPREP W/TINT 26 (MISCELLANEOUS) ×2 IMPLANT
COVER WAND RF STERILE (DRAPES) ×2 IMPLANT
CUFF TOURN SGL QUICK 24 (TOURNIQUET CUFF)
CUFF TOURN SGL QUICK 30 (TOURNIQUET CUFF)
CUFF TRNQT CYL 24X4X16.5-23 (TOURNIQUET CUFF) IMPLANT
CUFF TRNQT CYL 30X4X21-28X (TOURNIQUET CUFF) IMPLANT
DRAPE C-ARMOR (DRAPES) IMPLANT
GAUZE SPONGE 4X4 12PLY STRL (GAUZE/BANDAGES/DRESSINGS) ×2 IMPLANT
GLOVE SURG SYN 9.0  PF PI (GLOVE) ×1
GLOVE SURG SYN 9.0 PF PI (GLOVE) ×1 IMPLANT
GOWN SRG 2XL LVL 4 RGLN SLV (GOWNS) ×1 IMPLANT
GOWN STRL NON-REIN 2XL LVL4 (GOWNS) ×2
GOWN STRL REUS W/ TWL LRG LVL3 (GOWN DISPOSABLE) ×2 IMPLANT
GOWN STRL REUS W/TWL LRG LVL3 (GOWN DISPOSABLE) ×4
IV LACTATED RINGER IRRG 3000ML (IV SOLUTION) ×4
IV LR IRRIG 3000ML ARTHROMATIC (IV SOLUTION) ×2 IMPLANT
KIT TURNOVER KIT A (KITS) ×2 IMPLANT
MANIFOLD NEPTUNE II (INSTRUMENTS) ×4 IMPLANT
NEEDLE HYPO 22GX1.5 SAFETY (NEEDLE) ×2 IMPLANT
PACK ARTHROSCOPY KNEE (MISCELLANEOUS) ×2 IMPLANT
SCALPEL PROTECTED #11 DISP (BLADE) ×2 IMPLANT
SET TUBE SUCT SHAVER OUTFL 24K (TUBING) ×2 IMPLANT
SET TUBE TIP INTRA-ARTICULAR (MISCELLANEOUS) ×2 IMPLANT
SUT ETHILON 4-0 (SUTURE) ×2
SUT ETHILON 4-0 FS2 18XMFL BLK (SUTURE) ×1
SUTURE ETHLN 4-0 FS2 18XMF BLK (SUTURE) ×1 IMPLANT
TUBING ARTHRO INFLOW-ONLY STRL (TUBING) ×2 IMPLANT
WAND COBLATION FLOW 50 (SURGICAL WAND) ×2 IMPLANT

## 2020-11-13 NOTE — Op Note (Signed)
11/13/2020  1:05 PM  PATIENT:  [name] Goodman  80 y.o. male  PRE-OPERATIVE DIAGNOSIS:  Primary osteoarthritis of right knee M17.11 with medial lateral meniscus tears  POST-OPERATIVE DIAGNOSIS: Same  PROCEDURE:  Procedure(s): Right knee arthroscopy, partial medial and lateral meniscectomy (Right)  SURGEON: Leitha Schuller, MD  ASSISTANTS: None  ANESTHESIA:   general  EBL:  Total I/O In: 100 [IV Piggyback:100] Out: -   BLOOD ADMINISTERED:none  DRAINS: none   LOCAL MEDICATIONS USED:  MARCAINE     SPECIMEN:  No Specimen  DISPOSITION OF SPECIMEN:  N/A  COUNTS:  YES  TOURNIQUET:  * Missing tourniquet times found for documented tourniquets in log: [id number] *  IMPLANTS: None  DICTATION: .Dragon Dictation patient was brought to the operating room and after adequate anesthesia was obtained the right leg was placed in arthroscopic leg holder with tourniquet applied but not required.  After patient identification and timeout procedures were completed an inferior lateral portal was made and the arthroscope was introduced showing severe patellofemoral degenerative change.  Medial and laterally the in the gutters there were no loose bodies.  An inferior medial portal was made inspection revealed severe degenerative changes with fissuring and near full-thickness loss of articular cartilage in approximately 50% of tibia and femoral condyles there was a tear of the posterior third of the meniscus that could be displaced into the joint and a wand was used to ablate this back to a stable margin after the diagnostic portion of the procedure was completed ACL is intact in the lateral compartment there is a tear of the lateral meniscus anteriorly that displaced in the joint as well as some loose articular cartilage which was also addressed after this was all done the knee was irrigated till clear and instrumentation withdrawn with pre and post procedure pictures obtained.  Wounds were closed with  simple erupted 4-0 nylon with 10 cc of half percent Sensorcaine infiltrated to the areas of both incisions followed by dressings of Xeroform 4 x 4 web roll and Ace wrap  PLAN OF CARE: Discharge to home after PACU  PATIENT DISPOSITION:  PACU - hemodynamically stable.

## 2020-11-13 NOTE — Discharge Instructions (Addendum)
Knee Arthroscopy, Care After This sheet gives you information about how to care for yourself after your procedure. Your health care provider may also give you more specific instructions. If you have problems or questions, contact your health care provider. What can I expect after the procedure? After the procedure, it is common to have:  Soreness.  Swelling.  Pain.  A small amount of fluid from the incisions. Follow these instructions at home: Medicines  Take over-the-counter and prescription medicines only as told by your health care provider.  Ask your health care provider if the medicine prescribed to you: ? Requires you to avoid driving or using machinery. ? Can cause constipation. You may need to take these actions to prevent or treat constipation:  Drink enough fluid to keep your urine pale yellow.  Take over-the-counter or prescription medicines.  Eat foods that are high in fiber, such as beans, whole grains, and fresh fruits and vegetables.  Limit foods that are high in fat and processed sugars, such as fried or sweet foods. If you have a brace or immobilizer:  Wear it as told by your health care provider. Remove it only as told by your health care provider.  Loosen it if your toes tingle, become numb, or turn cold and blue.  Keep it clean and dry. Bathing  Do not take baths, swim, or use a hot tub until your health care provider approves. Ask your health care provider if you may take showers.  Keep your bandage (dressing) dry until your health care provider says that it can be removed.  If the brace or immobilizer is not waterproof: ? Do not let it get wet. ? Cover it with a watertight covering when you take a bath or shower. Incision care  Follow instructions from your health care provider about how to take care of your incisions. Make sure you: ? Wash your hands with soap and water for at least 20 seconds before and after you change your dressing. If soap and  water are not available, use hand sanitizer. ? Change your dressing as told by your health care provider. ? Leave stitches (sutures) or adhesive strips in place. These skin closures may need to stay in place for 2 weeks or longer. If adhesive strip edges start to loosen and curl up, you may trim the loose edges. Do not remove adhesive strips completely unless your health care provider tells you to do that.  Check your incision areas every day for signs of infection. Check for: ? Redness. ? More swelling or pain. ? Blood or more fluid. ? Warmth. ? Pus or a bad smell.   Managing pain, stiffness, and swelling  If directed, put ice on the injured area. To do this: ? If you have a removable brace or immobilizer, remove it as told by your health care provider. ? Put ice in a plastic bag or use the icing device (cold therapy unit) that you were given. Follow instructions about how to use the icing device. ? Place a towel between your skin and the bag or between your skin and the icing device. ? Leave the ice on for 20 minutes, 2-3 times a day. ? Remove the ice if your skin turns bright red. This is very important. If you cannot feel pain, heat, or cold, you have a greater risk of damage to the area.  Move your toes often to reduce stiffness and swelling.  Raise (elevate) the injured area above the level of your heart  while you are sitting or lying down.   Activity  Do not use your knee to support your body weight until your health care provider says that you can. Follow weight-bearing restrictions as told. Use crutches or other devices to help you move around (assistive devices) as told by your health care provider.  Ask your health care provider what activities are safe for you during recovery, and what activities you need to avoid.  If physical therapy was prescribed, do exercises as told by your health care provider. Doing exercises may help improve knee movement, range of motion, and  flexibility.  Do not lift anything that is heavier than 10 lb (4.5 kg), or the limit that you are told, until your health care provider says that it is safe. General instructions  Do not drive until your health care provider approves. You may be able to drive after 1-3 weeks.  Do not use any products that contain nicotine or tobacco, such as cigarettes, e-cigarettes, and chewing tobacco. These can delay incision or bone healing after surgery. If you need help quitting, ask your health care provider.  Wear compression stockings as told by your health care provider. These stockings help to prevent blood clots and reduce swelling in your legs.  Keep all follow-up visits. This is important. Contact a health care provider if:  You have any of these signs of infection: ? Redness or more pain around an incision. ? Blood or more fluid coming from an incision. ? Warmth coming from an incision. ? Pus or a bad smell coming from an incision. ? More swelling in your knee. ? A fever or chills.  You have severe knee pain, and medicine does not help.  An incision opens up. Get help right away if:  You have trouble breathing or shortness of breath.  You have chest pain.  You develop pain or swelling in your lower leg or at the back of your knee.  You have numbness or tingling in your lower leg or your foot.  You notice that your foot or toes look darker than normal or are cooler than normal. These symptoms may represent a serious problem that is an emergency. Do not wait to see if the symptoms will go away. Get medical help right away. Call your local emergency services (911 in the [place] Do not drive yourself to the hospital. Summary  To help relieve pain and swelling, put ice on the injured area for 20 minutes, 2-3 times a day.  Raise (elevate) the injured area above the level of your heart while you are sitting or lying down.  If physical therapy was prescribed, do exercises as told by  your health care provider. Exercises may help improve range of motion. This information is not intended to replace advice given to you by your health care provider. Make sure you discuss any questions you have with your health care provider. Document Revised: 01/09/2020 Document Reviewed: 01/09/2020 Elsevier Patient Education  2021 [employer]  AMBULATORY SURGERY  DISCHARGE INSTRUCTIONS   1) The drugs that you were given will stay in your system until tomorrow so for the next 24 hours you should not:  A) Drive an automobile B) Make any legal decisions C) Drink any alcoholic beverage   2) You may resume regular meals tomorrow.  Today it is better to start with liquids and gradually work up to solid foods.  You may eat anything you prefer, but it is better to start with liquids, then soup  and crackers, and gradually work up to solid foods.   3) Please notify your doctor immediately if you have any unusual bleeding, trouble breathing, redness and pain at the surgery site, drainage, fever, or pain not relieved by medication.    4) Additional Instructions:    Please contact your physician with any problems or Same Day Surgery at [phone], Monday through Friday 6 am to 4 pm, or [place] at [facility] number at [phone].

## 2020-11-13 NOTE — Transfer of Care (Signed)
Immediate Anesthesia Transfer of Care Note  Patient: [name] Goodman  Procedure(s) Performed: Right knee arthroscopy, partial medial and lateral meniscectomy (Right Knee)  Patient Location: PACU  Anesthesia Type:General  Level of Consciousness: sedated  Airway & Oxygen Therapy: Patient Spontanous Breathing and Patient connected to face mask oxygen  Post-op Assessment: Report given to RN and Post -op Vital signs reviewed and stable  Post vital signs: Reviewed and stable  Last Vitals:  Vitals Value Taken Time  BP    Temp    Pulse    Resp 12 11/13/20 1303  SpO2    Vitals shown include unvalidated device data.  Last Pain:  Vitals:   11/13/20 1100  TempSrc: Temporal  PainSc: 0-No pain         Complications: No complications documented.

## 2020-11-13 NOTE — Anesthesia Preprocedure Evaluation (Signed)
Anesthesia Evaluation  Patient identified by MRN, date of birth, ID band Patient awake    Reviewed: Allergy & Precautions, H&P , NPO status , Patient's Chart, lab work & pertinent test results, reviewed documented beta blocker date and time   Airway Mallampati: II  TM Distance: >3 FB Neck ROM: full    Dental  (+) Teeth Intact   Pulmonary neg pulmonary ROS, former smoker,    Pulmonary exam normal        Cardiovascular Exercise Tolerance: Good hypertension, negative cardio ROS Normal cardiovascular exam Rate:Normal     Neuro/Psych negative neurological ROS  negative psych ROS   GI/Hepatic negative GI ROS, Neg liver ROS,   Endo/Other  negative endocrine ROS  Renal/GU negative Renal ROS  negative genitourinary   Musculoskeletal   Abdominal   Peds  Hematology negative hematology ROS (+)   Anesthesia Other Findings   Reproductive/Obstetrics negative OB ROS                             Anesthesia Physical Anesthesia Plan  ASA: III  Anesthesia Plan: General LMA   Post-op Pain Management:    Induction:   PONV Risk Score and Plan:   Airway Management Planned:   Additional Equipment:   Intra-op Plan:   Post-operative Plan:   Informed Consent: I have reviewed the patients History and Physical, chart, labs and discussed the procedure including the risks, benefits and alternatives for the proposed anesthesia with the patient or authorized representative who has indicated his/her understanding and acceptance.       Plan Discussed with: CRNA  Anesthesia Plan Comments:         Anesthesia Quick Evaluation

## 2020-11-13 NOTE — H&P (Signed)
Chief Complaint  Patient presents with  . Follow-up  Rt Knee    History of the Present Illness: [name] Goodman is a 80 y.o. male here today.   The patient presents for evaluation of severe right knee osteoarthritis. His last x-rays were on 10/11/2020 with Dorthula Nettles, MD, and at that time he received an injection. He has severe tricompartmental osteoarthritis, especially in the patellofemoral joint with significant erosion. He comes in today for evaluation.  The patient states he has been walking for exercise. He states he has been not able to walk for 3 weeks. He remarks that he thinks he twisted his right knee. The patient notes that during the last snow storm, he walked 100 yards from his house, and he felt as though his right knee gave out. He locates his pain to the medial aspect of his right knee. The patient states when he gets up in the morning he can hardly stand on his right knee, and has a limp when he walks on it. The patient states he used to jog 5 miles every day, but he quit due to his right knee pain.  The patient has had prior meniscus surgery with me.   The patient enjoys golf.   I have reviewed past medical, surgical, social and family history, and allergies as documented in the EMR.  Past Medical History: Past Medical History:  Diagnosis Date  . Adenomatous colon polyp, unspecified  . BPH (benign prostatic hypertrophy)  . Hyperlipidemia  . Hypertension   Past Surgical History: Past Surgical History:  Procedure Laterality Date  . APPENDECTOMY  . ARTHROPLASTY TOTAL KNEE Left 07/23/2015  [place]  . COLONOSCOPY 06/2013  5 yrs  . Laser surgery as well as lithotripsy for kidney stone retrieval 04/2009, followed by Dr. Lonna Cobb  . Left Knee Arthroscopy, excision of plica and removal of loose body 07/31/2011   Past Family History: Family History  Problem Relation Age of Onset  . Lung cancer Mother  . Myocardial Infarction (Heart attack) Father  .  Diabetes Father  . Cancer Father  . Stroke Sister 28  d   Medications: Current Outpatient Medications Ordered in Epic  Medication Sig Dispense Refill  . aspirin 81 MG EC tablet Take 81 mg by mouth once daily.  Marland Kitchen atorvastatin (LIPITOR) 10 MG tablet Take 1 tablet (10 mg total) by mouth once daily 90 tablet 3  . cyanocobalamin (VITAMIN B12) 1000 MCG tablet Take 1 tablet (1,000 mcg total) by mouth once daily 30 tablet 11  . diltiazem (TIAZAC) 240 MG ER capsule Take 1 capsule (240 mg total) by mouth once daily 90 capsule 3  . fosinopriL (MONOPRIL) 20 MG tablet Take 1 tablet (20 mg total) by mouth once daily 90 tablet 3  . hydroCHLOROthiazide (HYDRODIURIL) 25 MG tablet Take 1 tablet (25 mg total) by mouth once daily 90 tablet 3   No current Epic-ordered facility-administered medications on file.   Allergies: No Known Allergies   Body mass index is 28.15 kg/m.  Review of Systems: A comprehensive 14 point ROS was performed, reviewed, and the pertinent orthopaedic findings are documented in the HPI.  Vitals:  10/26/20 0810  BP: (!) 142/70    General Physical Examination:   General/Constitutional: No apparent distress: well-nourished and well developed. Eyes: Pupils equal, round with synchronous movement. Lungs: Clear to auscultation HEENT: Normal Vascular: No edema, swelling or tenderness, except as noted in detailed exam. Cardiac: Heart rate and rhythm is regular. Integumentary: No impressive skin lesions present,  except as noted in detailed exam. Neuro/Psych: Normal mood and affect, oriented to person, place and time.  Musculoskeletal Examination:  On exam, right knee has range of motion of 10-120 degrees. Medial joint-line tenderness.  Radiographs:  No new imaging studies were obtained today.  Assessment: ICD-10-CM  1. Primary osteoarthritis of right knee M17.11   Plan:  The patient has clinical findings of severe right knee tricompartmental osteoarthritis,  especially the patellofemoral joint with significant erosion.  We discussed the patient's prior x-ray findings. I explained he may have torn something again, or he could have a bone bruise. I recommend an MRI of the right knee for further evaluation.  The patient will call us the day he has his MRI.  Attestation: I, Dawn Royse, am documenting for [employer], MD utilizing Nuance DAX.     Electronically signed by Marlena Clipper, MD at 10/28/2020 7:38 PM EST    MRI subsequently showed medial lateral meniscus tears and is brought in today for arthroscopy of the right knee. Reviewed  H+P. No changes noted.

## 2020-11-13 NOTE — Anesthesia Procedure Notes (Signed)
Procedure Name: LMA Insertion Date/Time: 11/13/2020 12:27 PM Performed by: Junious Silk, CRNA Pre-anesthesia Checklist: Patient identified, Patient being monitored, Timeout performed, Emergency Drugs available and Suction available Patient Re-evaluated:Patient Re-evaluated prior to induction Oxygen Delivery Method: Circle system utilized Preoxygenation: Pre-oxygenation with 100% oxygen Induction Type: IV induction Ventilation: Mask ventilation without difficulty LMA: LMA inserted LMA Size: 4.0 Tube type: Oral Number of attempts: 1 Placement Confirmation: positive ETCO2 and breath sounds checked- equal and bilateral Tube secured with: Tape Dental Injury: Teeth and Oropharynx as per pre-operative assessment

## 2020-11-14 ENCOUNTER — Encounter: Payer: Self-pay | Admitting: Orthopedic Surgery

## 2020-11-19 ENCOUNTER — Encounter: Payer: Self-pay | Admitting: Ophthalmology

## 2020-11-19 ENCOUNTER — Other Ambulatory Visit: Payer: Self-pay

## 2020-11-20 DIAGNOSIS — H2511 Age-related nuclear cataract, right eye: Secondary | ICD-10-CM | POA: Diagnosis not present

## 2020-11-20 DIAGNOSIS — E7800 Pure hypercholesterolemia, unspecified: Secondary | ICD-10-CM | POA: Diagnosis not present

## 2020-11-20 NOTE — Anesthesia Postprocedure Evaluation (Signed)
Anesthesia Post Note  Patient: [name] Goodman  Procedure(s) Performed: Right knee arthroscopy, partial medial and lateral meniscectomy (Right Knee)  Patient location during evaluation: PACU Anesthesia Type: General Level of consciousness: awake and alert Pain management: pain level controlled Vital Signs Assessment: post-procedure vital signs reviewed and stable Respiratory status: spontaneous breathing, nonlabored ventilation, respiratory function stable and patient connected to nasal cannula oxygen Cardiovascular status: blood pressure returned to baseline and stable Postop Assessment: no apparent nausea or vomiting Anesthetic complications: no   No complications documented.   Last Vitals:  Vitals:   11/13/20 1414 11/13/20 1519  BP: (!) 178/84 (!) (P) 153/82  Pulse: 66 (P) 69  Resp: 16 (P) 16  Temp:    SpO2: 100% (P) 99%    Last Pain:  Vitals:   11/14/20 0823  TempSrc:   PainSc: 0-No pain                 Yevette Edwards

## 2020-11-23 ENCOUNTER — Ambulatory Visit: Payer: PPO | Admitting: Urology

## 2020-11-23 ENCOUNTER — Other Ambulatory Visit
Admission: RE | Admit: 2020-11-23 | Discharge: 2020-11-23 | Disposition: A | Discharge status: Home / Self Care | Payer: PPO | Source: Ambulatory Visit | Attending: Ophthalmology | Admitting: Ophthalmology

## 2020-11-23 ENCOUNTER — Encounter: Payer: Self-pay | Admitting: Urology

## 2020-11-23 ENCOUNTER — Other Ambulatory Visit: Payer: Self-pay

## 2020-11-23 VITALS — BP 151/94 | HR 80 | Ht 73.0 in | Wt 215.0 lb

## 2020-11-23 DIAGNOSIS — N200 Calculus of kidney: Secondary | ICD-10-CM

## 2020-11-23 DIAGNOSIS — N401 Enlarged prostate with lower urinary tract symptoms: Secondary | ICD-10-CM | POA: Diagnosis not present

## 2020-11-23 DIAGNOSIS — Z20822 Contact with and (suspected) exposure to covid-19: Secondary | ICD-10-CM | POA: Insufficient documentation

## 2020-11-23 DIAGNOSIS — Z01812 Encounter for preprocedural laboratory examination: Secondary | ICD-10-CM | POA: Insufficient documentation

## 2020-11-23 LAB — SARS CORONAVIRUS 2 (TAT 6-24 HRS): SARS Coronavirus 2: NEGATIVE

## 2020-11-23 NOTE — Discharge Instructions (Signed)

## 2020-11-23 NOTE — Progress Notes (Signed)
11/23/2020 8:48 AM   [name] Goodman 24-Jan-1941 [id number]  Referring provider: [name], MD [address] [facility] [place],  [place] [address]  Chief Complaint  Patient presents with  . Nephrolithiasis     Urologic history: 1.BPH with urinary retention -Status post TURP 02/2014  2.Left nephrolithiasis -Nonobstructing lower pole renal calculi -Prior ureteroscopy/lithotripsy  HPI: 80 y.o. male presents for annual follow-up.   No bothersome LUTS  Nocturia x1-2  Denies gross hematuria, dysuria  Denies flank, abdominal or pelvic pain  PMH: Past Medical History:  Diagnosis Date  . Arthritis   . History of kidney stones   . Hypertension   . Nocturia     Surgical History: Past Surgical History:  Procedure Laterality Date  . APPENDECTOMY    . KNEE ARTHROSCOPY Left   . KNEE ARTHROSCOPY WITH LATERAL MENISECTOMY Right 11/13/2020   Procedure: Right knee arthroscopy, partial medial and lateral meniscectomy;  Surgeon: Kennedy Bucker, MD;  Location: [facility] ORS;  Service: Orthopedics;  Laterality: Right;  . POLYPECTOMY     WITH COLONOSCOPY  . TOTAL KNEE ARTHROPLASTY Left 07/23/2015   Procedure: LEFT TOTAL KNEE ARTHROPLASTY;  Surgeon: Ollen Gross, MD;  Location: [facility];  Service: Orthopedics;  Laterality: Left;  . TRANSURETHRAL RESECTION OF PROSTATE  ?2015  . URETEROSCOPY WITH HOLMIUM LASER LITHOTRIPSY      Home Medications:  Allergies as of 11/23/2020   No Known Allergies     Medication List       Accurate as of November 23, 2020  8:48 AM. If you have any questions, ask your nurse or doctor.        STOP taking these medications   HYDROcodone-acetaminophen 5-325 MG tablet Commonly known as: Norco Stopped by: Riki Altes, MD     TAKE these medications   aspirin EC 81 MG tablet Take 81 mg by mouth daily.   atorvastatin 10 MG tablet Commonly known as: LIPITOR Take 10 mg by mouth daily.    diltiazem 240 MG 24 hr capsule Commonly known as: CARDIZEM CD Take 240 mg by mouth daily.   fosinopril 20 MG tablet Commonly known as: MONOPRIL Take 20 mg by mouth daily.   hydrochlorothiazide 25 MG tablet Commonly known as: HYDRODIURIL Take 25 mg by mouth daily.   naproxen sodium 220 MG tablet Commonly known as: ALEVE Take 220-440 mg by mouth 2 (two) times daily as needed (pain).       Allergies: No Known Allergies  Family History: Family History  Problem Relation Age of Onset  . Hypertension Father   . Cancer Father     Social History:  reports that he quit smoking about 45 years ago. He has never used smokeless tobacco. He reports that he does not drink alcohol and does not use drugs.   Physical Exam: BP (!) 151/94   Pulse 80   Ht 6\' 1"  (1.854 m)   Wt 215 lb (97.5 kg)   BMI 28.37 kg/m   Constitutional:  Alert and oriented, No acute distress. HEENT: [place] AT, moist mucus membranes.  Trachea midline, no masses. Cardiovascular: No clubbing, cyanosis, or edema. Respiratory: Normal respiratory effort, no increased work of breathing.   Pertinent Imaging: Images were personally reviewed reviewed and interpreted.  Stable left lower pole calculi  Abdomen 1 view (KUB)  Narrative CLINICAL DATA:  Nephrolithiasis.  EXAM: ABDOMEN - 1 VIEW  COMPARISON:  November 24, 2018.  FINDINGS: The bowel gas pattern is normal. Stable calculi are seen projected over  lower pole of left kidney.  IMPRESSION: Stable left nephrolithiasis. No evidence of bowel obstruction or ileus.   Electronically Signed By: Lupita Raider M.D. On: 11/24/2019 14:26   Assessment & Plan:    1.  BPH with LUTS  Mild voiding symptoms which are stable  2.  Left nephrolithiasis  Stable left lower pole calculi  1 year follow-up with KUB   Riki Altes, MD  [facility] [address] [place], [place] [address] [phone]

## 2020-11-23 NOTE — Patient Instructions (Signed)
Dietary Guidelines to Help Prevent Kidney Stones Kidney stones are deposits of minerals and salts that form inside your kidneys. Your risk of developing kidney stones may be greater depending on your diet, your lifestyle, the medicines you take, and whether you have certain medical conditions. Most people can lower their chances of developing kidney stones by following the instructions below. Your dietitian may give you more specific instructions depending on your overall health and the type of kidney stones you tend to develop. What are tips for following this plan? Reading food labels  Choose foods with "no salt added" or "low-salt" labels. Limit your salt (sodium) intake to less than 1,500 mg a day.  Choose foods with calcium for each meal and snack. Try to eat about 300 mg of calcium at each meal. Foods that contain 200-500 mg of calcium a serving include: ? 8 oz (237 mL) of milk, calcium-fortifiednon-dairy milk, and calcium-fortifiedfruit juice. Calcium-fortified means that calcium has been added to these drinks. ? 8 oz (237 mL) of kefir, yogurt, and soy yogurt. ? 4 oz (114 g) of tofu. ? 1 oz (28 g) of cheese. ? 1 cup (150 g) of dried figs. ? 1 cup (91 g) of cooked broccoli. ? One 3 oz (85 g) can of sardines or mackerel. Most people need 1,000-1,500 mg of calcium a day. Talk to your dietitian about how much calcium is recommended for you.   Shopping  Buy plenty of fresh fruits and vegetables. Most people do not need to avoid fruits and vegetables, even if these foods contain nutrients that may contribute to kidney stones.  When shopping for convenience foods, choose: ? Whole pieces of fruit. ? Pre-made salads with dressing on the side. ? Low-fat fruit and yogurt smoothies.  Avoid buying frozen meals or prepared deli foods. These can be high in sodium.  Look for foods with live cultures, such as yogurt and kefir.  Choose high-fiber grains, such as whole-wheat breads, oat bran, and  wheat cereals. Cooking  Do not add salt to food when cooking. Place a salt shaker on the table and allow each person to add his or her own salt to taste.  Use vegetable protein, such as beans, textured vegetable protein (TVP), or tofu, instead of meat in pasta, casseroles, and soups. Meal planning  Eat less salt, if told by your dietitian. To do this: ? Avoid eating processed or pre-made food. ? Avoid eating fast food.  Eat less animal protein, including cheese, meat, poultry, or fish, if told by your dietitian. To do this: ? Limit the number of times you have meat, poultry, fish, or cheese each week. Eat a diet free of meat at least 2 days a week. ? Eat only one serving each day of meat, poultry, fish, or seafood. ? When you prepare animal protein, cut pieces into small portion sizes. For most meat and fish, one serving is about the size of the palm of your hand.  Eat at least five servings of fresh fruits and vegetables each day. To do this: ? Keep fruits and vegetables on hand for snacks. ? Eat one piece of fruit or a handful of berries with breakfast. ? Have a salad and fruit at lunch. ? Have two kinds of vegetables at dinner.  Limit foods that are high in a substance called oxalate. These include: ? Spinach (cooked), rhubarb, beets, sweet potatoes, and Swiss chard. ? Peanuts. ? Potato chips, french fries, and baked potatoes with skin on. ? Nuts and   nut products. ? Chocolate.  If you regularly take a diuretic medicine, make sure to eat at least 1 or 2 servings of fruits or vegetables that are high in potassium each day. These include: ? Avocado. ? Banana. ? Orange, prune, carrot, or tomato juice. ? Baked potato. ? Cabbage. ? Beans and split peas. Lifestyle  Drink enough fluid to keep your urine pale yellow. This is the most important thing you can do. Spread your fluid intake throughout the day.  If you drink alcohol: ? Limit how much you use to:  0-1 drink a day for  women who are not pregnant.  0-2 drinks a day for men. ? Be aware of how much alcohol is in your drink. In the [place] one drink equals one 12 oz bottle of beer (355 mL), one 5 oz glass of wine (148 mL), or one 1 oz glass of hard liquor (44 mL).  Lose weight if told by your health care provider. Work with your dietitian to find an eating plan and weight loss strategies that work best for you.   General information  Talk to your health care provider and dietitian about taking daily supplements. You may be told the following depending on your health and the cause of your kidney stones: ? Not to take supplements with vitamin C. ? To take a calcium supplement. ? To take a daily probiotic supplement. ? To take other supplements such as magnesium, fish oil, or vitamin B6.  Take over-the-counter and prescription medicines only as told by your health care provider. These include supplements. What foods should I limit? Limit your intake of the following foods, or eat them as told by your dietitian. Vegetables Spinach. Rhubarb. Beets. Canned vegetables. Pickles. Olives. Baked potatoes with skin. Grains Wheat bran. Baked goods. Salted crackers. Cereals high in sugar. Meats and other proteins Nuts. Nut butters. Large portions of meat, poultry, or fish. Salted, precooked, or cured meats, such as sausages, meat loaves, and hot dogs. Dairy Cheese. Beverages Regular soft drinks. Regular vegetable juice. Seasonings and condiments Seasoning blends with salt. Salad dressings. Soy sauce. Ketchup. Barbecue sauce. Other foods Canned soups. Canned pasta sauce. Casseroles. Pizza. Lasagna. Frozen meals. Potato chips. French fries. The items listed above may not be a complete list of foods and beverages you should limit. Contact a dietitian for more information. What foods should I avoid? Talk to your dietitian about specific foods you should avoid based on the type of kidney stones you have and your overall  health. Fruits Grapefruit. The item listed above may not be a complete list of foods and beverages you should avoid. Contact a dietitian for more information. Summary  Kidney stones are deposits of minerals and salts that form inside your kidneys.  You can lower your risk of kidney stones by making changes to your diet.  The most important thing you can do is drink enough fluid. Drink enough fluid to keep your urine pale yellow.  Talk to your dietitian about how much calcium you should have each day, and eat less salt and animal protein as told by your dietitian. This information is not intended to replace advice given to you by your health care provider. Make sure you discuss any questions you have with your health care provider. Document Revised: 09/01/2019 Document Reviewed: 09/01/2019 Elsevier Patient Education  2021 Elsevier Inc.  

## 2020-11-27 ENCOUNTER — Ambulatory Visit: Payer: PPO | Admitting: Anesthesiology

## 2020-11-27 ENCOUNTER — Other Ambulatory Visit: Payer: Self-pay

## 2020-11-27 ENCOUNTER — Encounter: Payer: Self-pay | Admitting: Ophthalmology

## 2020-11-27 ENCOUNTER — Encounter
Admission: RE | Disposition: A | Discharge status: Home / Self Care | Payer: Self-pay | Source: Home / Self Care | Attending: Ophthalmology

## 2020-11-27 ENCOUNTER — Ambulatory Visit
Admission: RE | Admit: 2020-11-27 | Discharge: 2020-11-27 | Disposition: A | Discharge status: Home / Self Care | Payer: PPO | Attending: Ophthalmology | Admitting: Ophthalmology

## 2020-11-27 DIAGNOSIS — H2511 Age-related nuclear cataract, right eye: Secondary | ICD-10-CM | POA: Insufficient documentation

## 2020-11-27 DIAGNOSIS — Z87891 Personal history of nicotine dependence: Secondary | ICD-10-CM | POA: Insufficient documentation

## 2020-11-27 DIAGNOSIS — H25811 Combined forms of age-related cataract, right eye: Secondary | ICD-10-CM | POA: Diagnosis not present

## 2020-11-27 HISTORY — PX: CATARACT EXTRACTION W/PHACO: SHX586

## 2020-11-27 SURGERY — PHACOEMULSIFICATION, CATARACT, WITH IOL INSERTION
Anesthesia: Monitor Anesthesia Care | Site: Eye | Laterality: Right

## 2020-11-27 MED ORDER — MOXIFLOXACIN HCL 0.5 % OP SOLN
OPHTHALMIC | Status: DC | PRN
  Administered 2020-11-27: 0.2 mL via OPHTHALMIC

## 2020-11-27 MED ORDER — TETRACAINE HCL 0.5 % OP SOLN
1.0000 [drp] | OPHTHALMIC | Status: DC | PRN
  Administered 2020-11-27 (×3): 1 [drp] via OPHTHALMIC

## 2020-11-27 MED ORDER — FENTANYL CITRATE (PF) 100 MCG/2ML IJ SOLN
INTRAMUSCULAR | Status: DC | PRN
  Administered 2020-11-27: 50 ug via INTRAVENOUS

## 2020-11-27 MED ORDER — NA CHONDROIT SULF-NA HYALURON 40-17 MG/ML IO SOLN
INTRAOCULAR | Status: DC | PRN
  Administered 2020-11-27: 1 mL via INTRAOCULAR

## 2020-11-27 MED ORDER — BRIMONIDINE TARTRATE-TIMOLOL 0.2-0.5 % OP SOLN
OPHTHALMIC | Status: DC | PRN
  Administered 2020-11-27: 1 [drp] via OPHTHALMIC

## 2020-11-27 MED ORDER — LIDOCAINE HCL (PF) 2 % IJ SOLN
INTRAOCULAR | Status: DC | PRN
  Administered 2020-11-27: 1 mL

## 2020-11-27 MED ORDER — PHENYLEPHRINE HCL 10 % OP SOLN
1.0000 [drp] | OPHTHALMIC | Status: DC | PRN
  Administered 2020-11-27 (×3): 1 [drp] via OPHTHALMIC

## 2020-11-27 MED ORDER — MIDAZOLAM HCL 2 MG/2ML IJ SOLN
INTRAMUSCULAR | Status: DC | PRN
  Administered 2020-11-27: 1 mg via INTRAVENOUS

## 2020-11-27 MED ORDER — CYCLOPENTOLATE HCL 2 % OP SOLN
1.0000 [drp] | OPHTHALMIC | Status: DC | PRN
  Administered 2020-11-27 (×3): 1 [drp] via OPHTHALMIC

## 2020-11-27 MED ORDER — EPINEPHRINE PF 1 MG/ML IJ SOLN
INTRAOCULAR | Status: DC | PRN
  Administered 2020-11-27: 47 mL via OPHTHALMIC

## 2020-11-27 SURGICAL SUPPLY — 21 items
CANNULA ANT/CHMB 27G (MISCELLANEOUS) ×2 IMPLANT
CANNULA ANT/CHMB 27GA (MISCELLANEOUS) ×4 IMPLANT
GLOVE SURG LX 8.0 MICRO (GLOVE) ×1
GLOVE SURG LX STRL 8.0 MICRO (GLOVE) ×1 IMPLANT
GLOVE SURG TRIUMPH 8.0 PF LTX (GLOVE) ×2 IMPLANT
GOWN STRL REUS W/ TWL LRG LVL3 (GOWN DISPOSABLE) ×2 IMPLANT
GOWN STRL REUS W/TWL LRG LVL3 (GOWN DISPOSABLE) ×4
LENS IOL ACRSF VT TRC 415 20.5 IMPLANT
LENS IOL ACRYSOF VIVITY 20.5 ×2 IMPLANT
LENS IOL VIVITY 415 20.5 ×1 IMPLANT
MARKER SKIN DUAL TIP RULER LAB (MISCELLANEOUS) ×2 IMPLANT
NDL FILTER BLUNT 18X1 1/2 (NEEDLE) ×1 IMPLANT
NEEDLE FILTER BLUNT 18X 1/2SAF (NEEDLE) ×1
NEEDLE FILTER BLUNT 18X1 1/2 (NEEDLE) ×1 IMPLANT
PACK EYE AFTER SURG (MISCELLANEOUS) ×2 IMPLANT
PACK OPTHALMIC (MISCELLANEOUS) ×2 IMPLANT
PACK PORFILIO (MISCELLANEOUS) ×2 IMPLANT
SYR 3ML LL SCALE MARK (SYRINGE) ×2 IMPLANT
SYR TB 1ML LUER SLIP (SYRINGE) ×2 IMPLANT
WATER STERILE IRR 250ML POUR (IV SOLUTION) ×2 IMPLANT
WIPE NON LINTING 3.25X3.25 (MISCELLANEOUS) ×2 IMPLANT

## 2020-11-27 NOTE — Op Note (Signed)
PREOPERATIVE DIAGNOSIS:  Nuclear sclerotic cataract of the right eye.   POSTOPERATIVE DIAGNOSIS:  Nuclear sclerotic cataract of the right eye.   OPERATIVE PROCEDURE: Procedure(s): CATARACT EXTRACTION PHACO AND INTRAOCULAR LENS PLACEMENT (IOC) RIGHT VIVITY LENS toric 6.31 00.41.8   SURGEON:  Galen Manila, MD.   ANESTHESIA: 1.      Managed anesthesia care. 2.     0.38ml of Shugarcaine was instilled following the paracentesis  Anesthesiologist: Page, Wille Celeste, MD CRNA: Maree Krabbe, CRNA  COMPLICATIONS:  None.   TECHNIQUE:   Stop and chop    DESCRIPTION OF PROCEDURE:  The patient was examined and consented in the preoperative holding area where the aforementioned topical anesthesia was applied to the right eye.  The patient was brought back to the Operating Room where he was sat upright on the gurney and given a target to fixate upon while the eye was marked at the 3:00 and 9:00 position.  The patient was then reclined on the operating table.  The eye was prepped and draped in the usual sterile ophthalmic fashion and a lid speculum was placed. A paracentesis was created with the side port blade and the anterior chamber was filled with viscoelastic. A near clear corneal incision was performed with the steel keratome. A continuous curvilinear capsulorrhexis was performed with a cystotome followed by the capsulorrhexis forceps. Hydrodissection and hydrodelineation were carried out with BSS on a blunt cannula. The lens was removed in a stop and chop technique and the remaining cortical material was removed with the irrigation-aspiration handpiece. The eye was inflated with viscoelastic and the [id number]  lens  was placed in the eye and rotated to within a few degrees of the predetermined orientation.  The remaining viscoelastic was removed from the eye.  The Sinskey hook was used to rotate the toric lens into its final resting place at 015 degrees.  0. The eye was inflated to a physiologic pressure and  found to be watertight. 0.31ml of Vigamox was placed in the anterior chamber.  The eye was dressed with Vigamox. The patient was given protective glasses to wear throughout the day and a shield with which to sleep tonight. The patient was also given drops with which to begin a drop regimen today and will follow-up with me in one day. Implant Name Type Inv. Item Serial No. Manufacturer Lot No. LRB No. Used Action  LENS IOL ACRYSOF VIVITY 20.5 - [id number]  LENS IOL ACRYSOF VIVITY 20.5 [id number] ALCON  Right 1 Implanted   Procedure(s): CATARACT EXTRACTION PHACO AND INTRAOCULAR LENS PLACEMENT (IOC) RIGHT VIVITY LENS toric 6.31 00.41.8 (Right)  Electronically signed: Galen Manila 11/27/2020 9:55 AM

## 2020-11-27 NOTE — H&P (Signed)
[facility]   Primary Care Physician:  [name], MD Ophthalmologist: Dr. Willey Blade  Pre-Procedure History & Physical: HPI:  [name] Goodman is a 80 y.o. male here for cataract surgery.   Past Medical History:  Diagnosis Date  . Arthritis   . History of kidney stones   . Hypertension   . Nocturia     Past Surgical History:  Procedure Laterality Date  . APPENDECTOMY    . KNEE ARTHROSCOPY Left   . KNEE ARTHROSCOPY WITH LATERAL MENISECTOMY Right 11/13/2020   Procedure: Right knee arthroscopy, partial medial and lateral meniscectomy;  Surgeon: Kennedy Bucker, MD;  Location: [facility] ORS;  Service: Orthopedics;  Laterality: Right;  . POLYPECTOMY     WITH COLONOSCOPY  . TOTAL KNEE ARTHROPLASTY Left 07/23/2015   Procedure: LEFT TOTAL KNEE ARTHROPLASTY;  Surgeon: Ollen Gross, MD;  Location: [facility];  Service: Orthopedics;  Laterality: Left;  . TRANSURETHRAL RESECTION OF PROSTATE  ?2015  . URETEROSCOPY WITH HOLMIUM LASER LITHOTRIPSY      Prior to Admission medications   Medication Sig Start Date End Date Taking? Authorizing Provider  aspirin EC 81 MG tablet Take 81 mg by mouth daily.   Yes [provider]  atorvastatin (LIPITOR) 10 MG tablet Take 10 mg by mouth daily.   Yes [provider]  diltiazem (CARDIZEM CD) 240 MG 24 hr capsule Take 240 mg by mouth daily. 10/01/20  Yes [provider]  fosinopril (MONOPRIL) 20 MG tablet Take 20 mg by mouth daily.   Yes [provider]  hydrochlorothiazide (HYDRODIURIL) 25 MG tablet Take 25 mg by mouth daily.   Yes [provider]  naproxen sodium (ALEVE) 220 MG tablet Take 220-440 mg by mouth 2 (two) times daily as needed (pain).   Yes [provider]    Allergies as of 10/22/2020  . (No Known Allergies)    Family History  Problem Relation Age of Onset  . Hypertension Father   . Cancer Father     Social History   Socioeconomic History  . Marital status: Divorced     Spouse name: Not on file  . Number of children: Not on file  . Years of education: Not on file  . Highest education level: Not on file  Occupational History  . Not on file  Tobacco Use  . Smoking status: Former Smoker    Quit date: 07/17/1975    Years since quitting: 45.3  . Smokeless tobacco: Never Used  Vaping Use  . Vaping Use: Never used  Substance and Sexual Activity  . Alcohol use: No  . Drug use: No  . Sexual activity: Not on file  Other Topics Concern  . Not on file  Social History Narrative  . Not on file   Social Determinants of Health   Financial Resource Strain: Not on file  Food Insecurity: Not on file  Transportation Needs: Not on file  Physical Activity: Not on file  Stress: Not on file  Social Connections: Not on file  Intimate Partner Violence: Not on file    Review of Systems: See HPI, otherwise negative ROS  Physical Exam: BP (!) 171/97   Pulse 71   Temp (!) 97.1 F (36.2 C) (Temporal)   Wt 97.1 kg   SpO2 98%   BMI 28.23 kg/m  General:   Alert,  pleasant and cooperative in NAD Head:  Normocephalic and atraumatic. Respiratory:  Normal work of breathing.  Impression/Plan: [name] Goodman is here for cataract surgery.  Risks, benefits, limitations, and alternatives regarding cataract surgery have been reviewed with the patient.  Questions have been answered.  All parties agreeable.   Galen Manila, MD  11/27/2020, 9:29 AM

## 2020-11-27 NOTE — Transfer of Care (Signed)
Immediate Anesthesia Transfer of Care Note  Patient: [name] Goodman  Procedure(s) Performed: CATARACT EXTRACTION PHACO AND INTRAOCULAR LENS PLACEMENT (IOC) RIGHT VIVITY LENS toric 6.31 00.41.8 (Right Eye)  Patient Location: PACU  Anesthesia Type: MAC  Level of Consciousness: awake, alert  and patient cooperative  Airway and Oxygen Therapy: Patient Spontanous Breathing and Patient connected to supplemental oxygen  Post-op Assessment: Post-op Vital signs reviewed, Patient's Cardiovascular Status Stable, Respiratory Function Stable, Patent Airway and No signs of Nausea or vomiting  Post-op Vital Signs: Reviewed and stable  Complications: No complications documented.

## 2020-11-27 NOTE — Anesthesia Preprocedure Evaluation (Signed)
Anesthesia Evaluation  Patient identified by MRN, date of birth, ID band Patient awake    History of Anesthesia Complications Negative for: history of anesthetic complications  Airway Mallampati: III  TM Distance: >3 FB Neck ROM: Full    Dental no notable dental hx.    Pulmonary neg pulmonary ROS, former smoker,    Pulmonary exam normal        Cardiovascular Exercise Tolerance: Good hypertension, Normal cardiovascular exam     Neuro/Psych negative neurological ROS  negative psych ROS   GI/Hepatic negative GI ROS, Neg liver ROS,   Endo/Other  negative endocrine ROS  Renal/GU negative Renal ROS     Musculoskeletal negative musculoskeletal ROS (+)   Abdominal   Peds  Hematology negative hematology ROS (+)   Anesthesia Other Findings   Reproductive/Obstetrics                             Anesthesia Physical Anesthesia Plan  ASA: II  Anesthesia Plan: MAC   Post-op Pain Management:    Induction: Intravenous  PONV Risk Score and Plan: 1 and Midazolam, TIVA and Treatment may vary due to age or medical condition  Airway Management Planned: Nasal Cannula and Natural Airway  Additional Equipment: None  Intra-op Plan:   Post-operative Plan:   Informed Consent: I have reviewed the patients History and Physical, chart, labs and discussed the procedure including the risks, benefits and alternatives for the proposed anesthesia with the patient or authorized representative who has indicated his/her understanding and acceptance.       Plan Discussed with: CRNA  Anesthesia Plan Comments:         Anesthesia Quick Evaluation

## 2020-11-27 NOTE — Anesthesia Procedure Notes (Signed)
Procedure Name: MAC Date/Time: 11/27/2020 9:36 AM Performed by: Cameron Ali, CRNA Pre-anesthesia Checklist: Patient identified, Emergency Drugs available, Suction available, Timeout performed and Patient being monitored Patient Re-evaluated:Patient Re-evaluated prior to induction Oxygen Delivery Method: Nasal cannula Placement Confirmation: positive ETCO2

## 2020-11-27 NOTE — Anesthesia Postprocedure Evaluation (Signed)
Anesthesia Post Note  Patient: [name] Goodman Ent  Procedure(s) Performed: CATARACT EXTRACTION PHACO AND INTRAOCULAR LENS PLACEMENT (IOC) RIGHT VIVITY LENS toric 6.31 00.41.8 (Right Eye)     Patient location during evaluation: PACU Anesthesia Type: MAC Level of consciousness: awake and alert Pain management: pain level controlled Vital Signs Assessment: post-procedure vital signs reviewed and stable Respiratory status: spontaneous breathing Cardiovascular status: blood pressure returned to baseline Postop Assessment: no apparent nausea or vomiting, adequate PO intake and no headache Anesthetic complications: no   No complications documented.  Adele Barthel [name]

## 2020-11-28 ENCOUNTER — Encounter: Payer: Self-pay | Admitting: Ophthalmology

## 2020-12-03 DIAGNOSIS — H2512 Age-related nuclear cataract, left eye: Secondary | ICD-10-CM | POA: Diagnosis not present

## 2020-12-07 ENCOUNTER — Inpatient Hospital Stay: Admission: RE | Admit: 2020-12-07 | Payer: PPO | Source: Ambulatory Visit

## 2020-12-10 DIAGNOSIS — M25561 Pain in right knee: Secondary | ICD-10-CM | POA: Diagnosis not present

## 2020-12-11 ENCOUNTER — Ambulatory Visit: Admit: 2020-12-11 | Payer: PPO | Admitting: Ophthalmology

## 2020-12-11 SURGERY — PHACOEMULSIFICATION, CATARACT, WITH IOL INSERTION
Anesthesia: Topical | Laterality: Left

## 2020-12-12 DIAGNOSIS — M25561 Pain in right knee: Secondary | ICD-10-CM | POA: Diagnosis not present

## 2020-12-20 DIAGNOSIS — M25561 Pain in right knee: Secondary | ICD-10-CM | POA: Diagnosis not present

## 2020-12-25 DIAGNOSIS — M25561 Pain in right knee: Secondary | ICD-10-CM | POA: Diagnosis not present

## 2021-01-01 DIAGNOSIS — M25561 Pain in right knee: Secondary | ICD-10-CM | POA: Diagnosis not present

## 2021-02-06 DIAGNOSIS — Z9889 Other specified postprocedural states: Secondary | ICD-10-CM | POA: Diagnosis not present

## 2021-02-06 DIAGNOSIS — M1711 Unilateral primary osteoarthritis, right knee: Secondary | ICD-10-CM | POA: Diagnosis not present

## 2021-02-14 DIAGNOSIS — Z0000 Encounter for general adult medical examination without abnormal findings: Secondary | ICD-10-CM | POA: Diagnosis not present

## 2021-02-14 DIAGNOSIS — M1711 Unilateral primary osteoarthritis, right knee: Secondary | ICD-10-CM | POA: Diagnosis not present

## 2021-02-14 DIAGNOSIS — E785 Hyperlipidemia, unspecified: Secondary | ICD-10-CM | POA: Diagnosis not present

## 2021-02-14 DIAGNOSIS — I10 Essential (primary) hypertension: Secondary | ICD-10-CM | POA: Diagnosis not present

## 2021-02-14 DIAGNOSIS — Z125 Encounter for screening for malignant neoplasm of prostate: Secondary | ICD-10-CM | POA: Diagnosis not present

## 2021-02-14 DIAGNOSIS — E538 Deficiency of other specified B group vitamins: Secondary | ICD-10-CM | POA: Diagnosis not present

## 2021-02-20 ENCOUNTER — Emergency Department
Admission: EM | Admit: 2021-02-20 | Discharge: 2021-02-20 | Disposition: A | Discharge status: Home / Self Care | Payer: PPO | Attending: Emergency Medicine | Admitting: Emergency Medicine

## 2021-02-20 ENCOUNTER — Other Ambulatory Visit: Payer: Self-pay

## 2021-02-20 ENCOUNTER — Encounter: Payer: Self-pay | Admitting: Emergency Medicine

## 2021-02-20 DIAGNOSIS — Z79899 Other long term (current) drug therapy: Secondary | ICD-10-CM | POA: Insufficient documentation

## 2021-02-20 DIAGNOSIS — I10 Essential (primary) hypertension: Secondary | ICD-10-CM | POA: Diagnosis not present

## 2021-02-20 DIAGNOSIS — Z87891 Personal history of nicotine dependence: Secondary | ICD-10-CM | POA: Diagnosis not present

## 2021-02-20 DIAGNOSIS — Z96652 Presence of left artificial knee joint: Secondary | ICD-10-CM | POA: Insufficient documentation

## 2021-02-20 DIAGNOSIS — R55 Syncope and collapse: Secondary | ICD-10-CM | POA: Insufficient documentation

## 2021-02-20 DIAGNOSIS — R61 Generalized hyperhidrosis: Secondary | ICD-10-CM | POA: Diagnosis not present

## 2021-02-20 DIAGNOSIS — R531 Weakness: Secondary | ICD-10-CM | POA: Diagnosis not present

## 2021-02-20 DIAGNOSIS — E860 Dehydration: Secondary | ICD-10-CM | POA: Diagnosis not present

## 2021-02-20 DIAGNOSIS — T675XXA Heat exhaustion, unspecified, initial encounter: Secondary | ICD-10-CM | POA: Diagnosis not present

## 2021-02-20 DIAGNOSIS — Z7982 Long term (current) use of aspirin: Secondary | ICD-10-CM | POA: Insufficient documentation

## 2021-02-20 DIAGNOSIS — I959 Hypotension, unspecified: Secondary | ICD-10-CM | POA: Diagnosis not present

## 2021-02-20 LAB — CBC
HCT: 47.8 % (ref 39.0–52.0)
Hemoglobin: 16.3 g/dL (ref 13.0–17.0)
MCH: 30.2 pg (ref 26.0–34.0)
MCHC: 34.1 g/dL (ref 30.0–36.0)
MCV: 88.5 fL (ref 80.0–100.0)
Platelets: 169 10*3/uL (ref 150–400)
RBC: 5.4 MIL/uL (ref 4.22–5.81)
RDW: 14 % (ref 11.5–15.5)
WBC: 12.3 10*3/uL — ABNORMAL HIGH (ref 4.0–10.5)
nRBC: 0 % (ref 0.0–0.2)

## 2021-02-20 LAB — BASIC METABOLIC PANEL
Anion gap: 13 (ref 5–15)
BUN: 33 mg/dL — ABNORMAL HIGH (ref 8–23)
CO2: 25 mmol/L (ref 22–32)
Calcium: 9.6 mg/dL (ref 8.9–10.3)
Chloride: 104 mmol/L (ref 98–111)
Creatinine, Ser: 2.06 mg/dL — ABNORMAL HIGH (ref 0.61–1.24)
GFR, Estimated: 32 mL/min — ABNORMAL LOW (ref >60)
Glucose, Bld: 163 mg/dL — ABNORMAL HIGH (ref 70–99)
Potassium: 3.9 mmol/L (ref 3.5–5.1)
Sodium: 142 mmol/L (ref 135–145)

## 2021-02-20 MED ORDER — LACTATED RINGERS IV BOLUS
1000.0000 mL | Freq: Once | INTRAVENOUS | Status: AC
  Administered 2021-02-20: 1000 mL via INTRAVENOUS

## 2021-02-20 NOTE — ED Triage Notes (Signed)
Pt comes into the ED via EMS , pt had been out playing golf all day is is feeling weak, near syncope  126/77 66 [id number] 98%RA #20LAC received NS

## 2021-02-20 NOTE — ED Triage Notes (Signed)
Pt comes into the ED via [facility] from playing golf where he ahd a near syncopal episode.  Pt states that he got real hot and had to sit down.  Pt currently is neurologically intact and in NAD.  CBG 157 with EMS and all VSS.

## 2021-02-20 NOTE — ED Provider Notes (Signed)
[facility] Emergency Department Provider Note  ____________________________________________  Time seen: Approximately 5:59 PM  I have reviewed the triage vital signs and the nursing notes.   HISTORY  Chief Complaint Near Syncope    HPI [name] Goodman is a 80 y.o. male with a past history of hypertension who reports being in his usual state of health this morning, went to play golf and had been outside for about 4 hours when he had progressive symptoms of lightheadedness with changing of position, fatigue, started to feel very hot.  When he went to sit down, he then briefly passed out in the chair.  He denies any preceding pain syndrome or shortness of breath, no chest pain or belly pain or headache.  No paresthesias or weakness.  His golf partner was with him the whole time and did not notice any other unusual events, no trauma.  He was given 500 mL of IV fluids by EMS and does feel better.  States that he feels back to normal currently.      Past Medical History:  Diagnosis Date  . Arthritis   . History of kidney stones   . Hypertension   . Nocturia      Patient Active Problem List   Diagnosis Date Noted  . Nephrolithiasis 11/24/2017  . BPH without obstruction/lower urinary tract symptoms 11/24/2017  . Sinus bradycardia 02/22/2017  . OA (osteoarthritis) of knee 07/23/2015     Past Surgical History:  Procedure Laterality Date  . APPENDECTOMY    . CATARACT EXTRACTION W/PHACO Right 11/27/2020   Procedure: CATARACT EXTRACTION PHACO AND INTRAOCULAR LENS PLACEMENT (IOC) RIGHT VIVITY LENS toric 6.31 00.41.8;  Surgeon: Galen Manila, MD;  Location: [facility];  Service: Ophthalmology;  Laterality: Right;  . KNEE ARTHROSCOPY Left   . KNEE ARTHROSCOPY WITH LATERAL MENISECTOMY Right 11/13/2020   Procedure: Right knee arthroscopy, partial medial and lateral meniscectomy;  Surgeon: Kennedy Bucker, MD;  Location: [facility];  Service: Orthopedics;   Laterality: Right;  . POLYPECTOMY     WITH COLONOSCOPY  . TOTAL KNEE ARTHROPLASTY Left 07/23/2015   Procedure: LEFT TOTAL KNEE ARTHROPLASTY;  Surgeon: Ollen Gross, MD;  Location: [facility];  Service: Orthopedics;  Laterality: Left;  . TRANSURETHRAL RESECTION OF PROSTATE  ?2015  . URETEROSCOPY WITH HOLMIUM LASER LITHOTRIPSY       Prior to Admission medications   Medication Sig Start Date End Date Taking? Authorizing Provider  aspirin EC 81 MG tablet Take 81 mg by mouth daily.    [provider]  atorvastatin (LIPITOR) 10 MG tablet Take 10 mg by mouth daily.    [provider]  diltiazem (CARDIZEM CD) 240 MG 24 hr capsule Take 240 mg by mouth daily. 10/01/20   [provider]  fosinopril (MONOPRIL) 20 MG tablet Take 20 mg by mouth daily.    [provider]  hydrochlorothiazide (HYDRODIURIL) 25 MG tablet Take 25 mg by mouth daily.    [provider]  naproxen sodium (ALEVE) 220 MG tablet Take 220-440 mg by mouth 2 (two) times daily as needed (pain).    [provider]     Allergies Patient has no known allergies.   Family History  Problem Relation Age of Onset  . Hypertension Father   . Cancer Father     Social History Social History   Tobacco Use  . Smoking status: Former Smoker    Quit date: 07/17/1975    Years since quitting: 45.6  . Smokeless tobacco: Never  Used  Vaping Use  . Vaping Use: Never used  Substance Use Topics  . Alcohol use: No  . Drug use: No    Review of Systems  Constitutional:   No fever or chills.  ENT:   No sore throat. No rhinorrhea. Cardiovascular:   No chest pain, positive syncope. Respiratory:   No dyspnea or cough. Gastrointestinal:   Negative for abdominal pain, vomiting and diarrhea.  Musculoskeletal:   Negative for focal pain or swelling All other systems reviewed and are negative except as documented above in ROS and HPI.  ____________________________________________   PHYSICAL  EXAM:  VITAL SIGNS: ED Triage Vitals  Enc Vitals Group     BP 02/20/21 1534 130/72     Pulse Rate 02/20/21 1534 (!) 59     Resp 02/20/21 1534 18     Temp 02/20/21 1534 97.6 F (36.4 C)     Temp Source 02/20/21 1534 Oral     SpO2 02/20/21 1534 100 %     Weight 02/20/21 1530 214 lb (97.1 kg)     Height 02/20/21 1530 6\' 1"  (1.854 m)     Head Circumference --      Peak Flow --      Pain Score 02/20/21 1530 0     Pain Loc --      Pain Edu? --      Excl. in GC? --     Vital signs reviewed, nursing assessments reviewed.   Constitutional:   Alert and oriented. Non-toxic appearance. Eyes:   Conjunctivae are normal. EOMI. PERRL. ENT      Head:   Normocephalic and atraumatic.      Nose: Normal.      Mouth/Throat: Dry mucous membranes.      Neck:   No meningismus. Full ROM. Hematological/Lymphatic/Immunilogical:   No cervical lymphadenopathy. Cardiovascular:   RRR. Symmetric bilateral radial and DP pulses.  No murmurs. Cap refill less than 2 seconds. Respiratory:   Normal respiratory effort without tachypnea/retractions. Breath sounds are clear and equal bilaterally. No wheezes/rales/rhonchi. Gastrointestinal:   Soft and nontender. Non distended. There is no CVA tenderness.  No rebound, rigidity, or guarding. Genitourinary:   deferred Musculoskeletal:   Normal range of motion in all extremities. No joint effusions.  No lower extremity tenderness.  No edema. Neurologic:   Normal speech and language.  Motor grossly intact. No acute focal neurologic deficits are appreciated.  Skin:    Skin is warm, dry and intact. No rash noted.  No petechiae, purpura, or bullae.  ____________________________________________    LABS (pertinent positives/negatives) (all labs ordered are listed, but only abnormal results are displayed) Labs Reviewed  BASIC METABOLIC PANEL - Abnormal; Notable for the following components:      Result Value   Glucose, Bld 163 (*)    BUN 33 (*)    Creatinine, Ser  2.06 (*)    GFR, Estimated 32 (*)    All other components within normal limits  CBC - Abnormal; Notable for the following components:   WBC 12.3 (*)    All other components within normal limits  URINALYSIS, COMPLETE (UACMP) WITH MICROSCOPIC   ____________________________________________   EKG  Interpreted by me Sinus bradycardia rate of 57, normal axis.  First-degree AV block.  Normal ST segments and T waves.  No ischemic changes.  ____________________________________________    RADIOLOGY  No results found.  ____________________________________________   PROCEDURES Procedures  ____________________________________________  DIFFERENTIAL DIAGNOSIS   Electrolyte abnormality, dehydration, vasodilatory/heat syncope, heat exhaustion  CLINICAL  IMPRESSION / ASSESSMENT AND PLAN / ED COURSE  Medications ordered in the ED: Medications  lactated ringers bolus 1,000 mL (1,000 mLs Intravenous New Bag/Given 02/20/21 1749)    Pertinent labs & imaging results that were available during my care of the patient were reviewed by me and considered in my medical decision making (see chart for details).  [name] Goodman was evaluated in Emergency Department on 02/20/2021 for the symptoms described in the history of present illness. He was evaluated in the context of the global COVID-19 pandemic, which necessitated consideration that the patient might be at risk for infection with the SARS-CoV-2 virus that causes COVID-19. Institutional protocols and algorithms that pertain to the evaluation of patients at risk for COVID-19 are in a state of rapid change based on information released by regulatory bodies including the CDC and federal and state organizations. These policies and algorithms were followed during the patient's care in the ED.   Patient presents with lightheadedness with position changes and one episode of syncope after 4 hours of playing golf in the heat with minimal fluid intake.   Currently asymptomatic, feeling better after receiving half liter of fluids by EMS.  Exam is reassuring, vital signs unremarkable.  EKG unremarkable, labs consistent with dehydration with minor renal insufficiency.  Patient eager to go home.  Will give additional IV fluids and plan for discharge.      ____________________________________________   FINAL CLINICAL IMPRESSION(S) / ED DIAGNOSES    Final diagnoses:  Dehydration  Syncope, unspecified syncope type     ED Discharge Orders    None      Portions of this note were generated with dragon dictation software. Dictation errors may occur despite best attempts at proofreading.   Sharman Cheek, MD 02/20/21 [phone]

## 2021-04-08 ENCOUNTER — Other Ambulatory Visit: Payer: Self-pay | Admitting: Orthopedic Surgery

## 2021-04-08 ENCOUNTER — Other Ambulatory Visit (HOSPITAL_COMMUNITY): Payer: Self-pay | Admitting: Orthopedic Surgery

## 2021-04-08 DIAGNOSIS — M1711 Unilateral primary osteoarthritis, right knee: Secondary | ICD-10-CM | POA: Diagnosis not present

## 2021-04-17 ENCOUNTER — Ambulatory Visit
Admission: RE | Admit: 2021-04-17 | Discharge: 2021-04-17 | Disposition: A | Discharge status: Home / Self Care | Payer: PPO | Source: Ambulatory Visit | Attending: Orthopedic Surgery | Admitting: Orthopedic Surgery

## 2021-04-17 ENCOUNTER — Other Ambulatory Visit: Payer: Self-pay

## 2021-04-17 DIAGNOSIS — M1711 Unilateral primary osteoarthritis, right knee: Secondary | ICD-10-CM | POA: Diagnosis not present

## 2021-04-17 DIAGNOSIS — M25461 Effusion, right knee: Secondary | ICD-10-CM | POA: Diagnosis not present

## 2021-04-17 DIAGNOSIS — Z01818 Encounter for other preprocedural examination: Secondary | ICD-10-CM | POA: Diagnosis not present

## 2021-04-30 ENCOUNTER — Other Ambulatory Visit: Payer: Self-pay

## 2021-04-30 ENCOUNTER — Ambulatory Visit
Admission: RE | Admit: 2021-04-30 | Discharge: 2021-04-30 | Disposition: A | Discharge status: Home / Self Care | Payer: PPO | Source: Ambulatory Visit | Attending: Orthopedic Surgery | Admitting: Orthopedic Surgery

## 2021-04-30 ENCOUNTER — Other Ambulatory Visit: Payer: Self-pay | Admitting: Orthopedic Surgery

## 2021-04-30 DIAGNOSIS — M25561 Pain in right knee: Secondary | ICD-10-CM | POA: Diagnosis not present

## 2021-04-30 DIAGNOSIS — M25461 Effusion, right knee: Secondary | ICD-10-CM | POA: Diagnosis not present

## 2021-04-30 DIAGNOSIS — G8929 Other chronic pain: Secondary | ICD-10-CM | POA: Insufficient documentation

## 2021-04-30 DIAGNOSIS — Z01818 Encounter for other preprocedural examination: Secondary | ICD-10-CM | POA: Diagnosis not present

## 2021-04-30 DIAGNOSIS — M1711 Unilateral primary osteoarthritis, right knee: Secondary | ICD-10-CM | POA: Diagnosis not present

## 2021-05-13 ENCOUNTER — Other Ambulatory Visit: Payer: Self-pay | Admitting: Orthopedic Surgery

## 2021-05-31 ENCOUNTER — Other Ambulatory Visit
Admission: RE | Admit: 2021-05-31 | Discharge: 2021-05-31 | Disposition: A | Discharge status: Home / Self Care | Payer: PPO | Source: Ambulatory Visit | Attending: Orthopedic Surgery | Admitting: Orthopedic Surgery

## 2021-05-31 ENCOUNTER — Other Ambulatory Visit: Payer: Self-pay

## 2021-05-31 DIAGNOSIS — Z01818 Encounter for other preprocedural examination: Secondary | ICD-10-CM | POA: Diagnosis not present

## 2021-05-31 LAB — COMPREHENSIVE METABOLIC PANEL
ALT: 26 U/L (ref 0–44)
AST: 19 U/L (ref 15–41)
Albumin: 4.6 g/dL (ref 3.5–5.0)
Alkaline Phosphatase: 57 U/L (ref 38–126)
Anion gap: 9 (ref 5–15)
BUN: 23 mg/dL (ref 8–23)
CO2: 29 mmol/L (ref 22–32)
Calcium: 9.7 mg/dL (ref 8.9–10.3)
Chloride: 101 mmol/L (ref 98–111)
Creatinine, Ser: 1.14 mg/dL (ref 0.61–1.24)
GFR, Estimated: >60 mL/min (ref >60)
Glucose, Bld: 108 mg/dL — ABNORMAL HIGH (ref 70–99)
Potassium: 3.7 mmol/L (ref 3.5–5.1)
Sodium: 139 mmol/L (ref 135–145)
Total Bilirubin: 1.5 mg/dL — ABNORMAL HIGH (ref 0.3–1.2)
Total Protein: 7.6 g/dL (ref 6.5–8.1)

## 2021-05-31 LAB — TYPE AND SCREEN
ABO/RH(D): O POS
Antibody Screen: NEGATIVE

## 2021-05-31 LAB — CBC WITH DIFFERENTIAL/PLATELET
Abs Immature Granulocytes: 0.01 10*3/uL (ref 0.00–0.07)
Basophils Absolute: 0 10*3/uL (ref 0.0–0.1)
Basophils Relative: 1 %
Eosinophils Absolute: 0.1 10*3/uL (ref 0.0–0.5)
Eosinophils Relative: 2 %
HCT: 48.9 % (ref 39.0–52.0)
Hemoglobin: 17 g/dL (ref 13.0–17.0)
Immature Granulocytes: 0 %
Lymphocytes Relative: 20 %
Lymphs Abs: 1.2 10*3/uL (ref 0.7–4.0)
MCH: 31 pg (ref 26.0–34.0)
MCHC: 34.8 g/dL (ref 30.0–36.0)
MCV: 89.2 fL (ref 80.0–100.0)
Monocytes Absolute: 0.5 10*3/uL (ref 0.1–1.0)
Monocytes Relative: 9 %
Neutro Abs: 4 10*3/uL (ref 1.7–7.7)
Neutrophils Relative %: 68 %
Platelets: 174 10*3/uL (ref 150–400)
RBC: 5.48 MIL/uL (ref 4.22–5.81)
RDW: 13.2 % (ref 11.5–15.5)
WBC: 5.8 10*3/uL (ref 4.0–10.5)
nRBC: 0 % (ref 0.0–0.2)

## 2021-05-31 LAB — URINALYSIS, ROUTINE W REFLEX MICROSCOPIC
Bacteria, UA: NONE SEEN
Bilirubin Urine: NEGATIVE
Glucose, UA: NEGATIVE mg/dL
Hgb urine dipstick: NEGATIVE
Ketones, ur: NEGATIVE mg/dL
Leukocytes,Ua: NEGATIVE
Nitrite: NEGATIVE
Protein, ur: NEGATIVE mg/dL
Specific Gravity, Urine: 1.02 (ref 1.005–1.030)
pH: 7 (ref 5.0–8.0)

## 2021-05-31 LAB — SURGICAL PCR SCREEN
MRSA, PCR: NEGATIVE
Staphylococcus aureus: NEGATIVE

## 2021-05-31 NOTE — Patient Instructions (Addendum)
Your procedure is scheduled on:06/11/21 - Tuesday Report to the Registration Desk on the 1st floor of the [facility]. To find out your arrival time, please call [phone] between 1PM - 3PM on: 06/10/21 - Monday Covid Test on 06/07/21 at 8 am- Report to the [facility].  REMEMBER: Instructions that are not followed completely may result in serious medical risk, up to and including death; or upon the discretion of your surgeon and anesthesiologist your surgery may need to be rescheduled.  Do not eat food after midnight the night before surgery.  No gum chewing, lozengers or hard candies.  You may however, drink CLEAR liquids up to 2 hours before you are scheduled to arrive for your surgery. Do not drink anything within 2 hours of your scheduled arrival time.  Clear liquids include: - water  - apple juice without pulp - gatorade (not RED, PURPLE, OR BLUE) - black coffee or tea (Do NOT add milk or creamers to the coffee or tea) Do NOT drink anything that is not on this list.  In addition, your doctor has ordered for you to drink the provided  Ensure Pre-Surgery Clear Carbohydrate Drink Drinking this carbohydrate drink up to two hours before surgery helps to reduce insulin resistance and improve patient outcomes. Please complete drinking 2 hours prior to scheduled arrival time.  TAKE THESE MEDICATIONS THE MORNING OF SURGERY WITH A SIP OF WATER:  - diltiazem (TIAZAC) 240 MG 24 hr capsule - atorvastatin (LIPITOR) 10 MG tablet  Follow recommendations from Cardiologist, Pulmonologist or PCP regarding stopping Aspirin, Coumadin, Plavix, Eliquis, Pradaxa, or Pletal.  One week prior to surgery: Stop Anti-inflammatories (NSAIDS) such as Advil, Aleve, Ibuprofen, Motrin, Naproxen, Naprosyn and Aspirin based products such as Excedrin, Goodys Powder, BC Powder.  Stop ANY OVER THE COUNTER supplements until after surgery.  You may take Tylenol as directed if needed for pain up until the day  of surgery.  No Alcohol for 24 hours before or after surgery.  No Smoking including e-cigarettes for 24 hours prior to surgery.  No chewable tobacco products for at least 6 hours prior to surgery.  No nicotine patches on the day of surgery.  Do not use any "recreational" drugs for at least a week prior to your surgery.  Please be advised that the combination of cocaine and anesthesia may have negative outcomes, up to and including death. If you test positive for cocaine, your surgery will be cancelled.  On the morning of surgery brush your teeth with toothpaste and water, you may rinse your mouth with mouthwash if you wish. Do not swallow any toothpaste or mouthwash.  Do not wear jewelry, make-up, hairpins, clips or nail polish.  Do not wear lotions, powders, or perfumes.   Do not shave body from the neck down 48 hours prior to surgery just in case you cut yourself which could leave a site for infection.  Also, freshly shaved skin may become irritated if using the CHG soap.  Contact lenses, hearing aids and dentures may not be worn into surgery.  Do not bring valuables to the hospital. [facility] is not responsible for any missing/lost belongings or valuables.   Use CHG Soap or wipes as directed on instruction sheet.  Notify your doctor if there is any change in your medical condition (cold, fever, infection).  Wear comfortable clothing (specific to your surgery type) to the hospital.  After surgery, you can help prevent lung complications by doing breathing exercises.  Take deep breaths and  cough every 1-2 hours. Your doctor may order a device called an Incentive Spirometer to help you take deep breaths. When coughing or sneezing, hold a pillow firmly against your incision with both hands. This is called "splinting." Doing this helps protect your incision. It also decreases belly discomfort.  If you are being admitted to the hospital overnight, leave your suitcase in the  car. After surgery it may be brought to your room.  If you are being discharged the day of surgery, you will not be allowed to drive home. You will need a responsible adult (18 years or older) to drive you home and stay with you that night.   If you are taking public transportation, you will need to have a responsible adult (18 years or older) with you. Please confirm with your physician that it is acceptable to use public transportation.   Please call the Pre-admissions Testing Dept. at [phone] if you have any questions about these instructions.  Surgery Visitation Policy:  Patients undergoing a surgery or procedure may have one family member or support person with them as long as that person is not COVID-19 positive or experiencing its symptoms.  That person may remain in the waiting area during the procedure.  Inpatient Visitation:    Visiting hours are 7 a.m. to 8 p.m. Inpatients will be allowed two visitors daily. The visitors may change each day during the patient's stay. No visitors under the age of 56. Any visitor under the age of 74 must be accompanied by an adult. The visitor must pass COVID-19 screenings, use hand sanitizer when entering and exiting the patient's room and wear a mask at all times, including in the patient's room. Patients must also wear a mask when staff or their visitor are in the room. Masking is required regardless of vaccination status.

## 2021-06-07 ENCOUNTER — Other Ambulatory Visit: Payer: Self-pay

## 2021-06-07 ENCOUNTER — Other Ambulatory Visit
Admission: RE | Admit: 2021-06-07 | Discharge: 2021-06-07 | Disposition: A | Discharge status: Home / Self Care | Payer: PPO | Source: Ambulatory Visit | Attending: Orthopedic Surgery | Admitting: Orthopedic Surgery

## 2021-06-07 DIAGNOSIS — Z01812 Encounter for preprocedural laboratory examination: Secondary | ICD-10-CM | POA: Insufficient documentation

## 2021-06-07 DIAGNOSIS — Z20822 Contact with and (suspected) exposure to covid-19: Secondary | ICD-10-CM | POA: Insufficient documentation

## 2021-06-07 LAB — SARS CORONAVIRUS 2 (TAT 6-24 HRS): SARS Coronavirus 2: NEGATIVE

## 2021-06-11 ENCOUNTER — Observation Stay: Payer: PPO

## 2021-06-11 ENCOUNTER — Ambulatory Visit: Payer: PPO | Admitting: Urgent Care

## 2021-06-11 ENCOUNTER — Encounter: Payer: Self-pay | Admitting: Orthopedic Surgery

## 2021-06-11 ENCOUNTER — Other Ambulatory Visit: Payer: Self-pay

## 2021-06-11 ENCOUNTER — Encounter
Admission: RE | Disposition: A | Discharge status: Home / Self Care | Payer: Self-pay | Source: Home / Self Care | Attending: Orthopedic Surgery

## 2021-06-11 ENCOUNTER — Observation Stay
Admission: RE | Admit: 2021-06-11 | Discharge: 2021-06-12 | Disposition: A | Discharge status: Home / Self Care | Payer: PPO | Attending: Orthopedic Surgery | Admitting: Orthopedic Surgery

## 2021-06-11 DIAGNOSIS — Z7982 Long term (current) use of aspirin: Secondary | ICD-10-CM | POA: Diagnosis not present

## 2021-06-11 DIAGNOSIS — G8918 Other acute postprocedural pain: Secondary | ICD-10-CM

## 2021-06-11 DIAGNOSIS — M1611 Unilateral primary osteoarthritis, right hip: Secondary | ICD-10-CM | POA: Diagnosis not present

## 2021-06-11 DIAGNOSIS — I10 Essential (primary) hypertension: Secondary | ICD-10-CM | POA: Insufficient documentation

## 2021-06-11 DIAGNOSIS — Z96651 Presence of right artificial knee joint: Secondary | ICD-10-CM

## 2021-06-11 DIAGNOSIS — Z79899 Other long term (current) drug therapy: Secondary | ICD-10-CM | POA: Insufficient documentation

## 2021-06-11 DIAGNOSIS — M1711 Unilateral primary osteoarthritis, right knee: Principal | ICD-10-CM | POA: Insufficient documentation

## 2021-06-11 HISTORY — PX: TOTAL KNEE ARTHROPLASTY: SHX125

## 2021-06-11 LAB — CBC
HCT: 40.8 % (ref 39.0–52.0)
Hemoglobin: 14.4 g/dL (ref 13.0–17.0)
MCH: 32 pg (ref 26.0–34.0)
MCHC: 35.3 g/dL (ref 30.0–36.0)
MCV: 90.7 fL (ref 80.0–100.0)
Platelets: 147 10*3/uL — ABNORMAL LOW (ref 150–400)
RBC: 4.5 MIL/uL (ref 4.22–5.81)
RDW: 13 % (ref 11.5–15.5)
WBC: 14.3 10*3/uL — ABNORMAL HIGH (ref 4.0–10.5)
nRBC: 0 % (ref 0.0–0.2)

## 2021-06-11 LAB — CREATININE, SERUM
Creatinine, Ser: 1.2 mg/dL (ref 0.61–1.24)
GFR, Estimated: >60 mL/min (ref >60)

## 2021-06-11 SURGERY — ARTHROPLASTY, KNEE, TOTAL
Anesthesia: Spinal | Site: Knee | Laterality: Right

## 2021-06-11 MED ORDER — DEXMEDETOMIDINE (PRECEDEX) IN NS 20 MCG/5ML (4 MCG/ML) IV SYRINGE
PREFILLED_SYRINGE | INTRAVENOUS | Status: AC
  Filled 2021-06-11: qty 5

## 2021-06-11 MED ORDER — PROPOFOL 10 MG/ML IV BOLUS
INTRAVENOUS | Status: DC | PRN
  Administered 2021-06-11: 30 mg via INTRAVENOUS

## 2021-06-11 MED ORDER — BUPIVACAINE LIPOSOME 1.3 % IJ SUSP
INTRAMUSCULAR | Status: AC
  Filled 2021-06-11: qty 20

## 2021-06-11 MED ORDER — HYDROCODONE-ACETAMINOPHEN 7.5-325 MG PO TABS
1.0000 | ORAL_TABLET | ORAL | Status: DC | PRN
  Administered 2021-06-11: 1 via ORAL
  Filled 2021-06-11: qty 1
  Filled 2021-06-11: qty 2

## 2021-06-11 MED ORDER — CEFAZOLIN SODIUM-DEXTROSE 2-4 GM/100ML-% IV SOLN
2.0000 g | INTRAVENOUS | Status: AC
  Administered 2021-06-11: 2 g via INTRAVENOUS

## 2021-06-11 MED ORDER — PROPOFOL 1000 MG/100ML IV EMUL
INTRAVENOUS | Status: AC
  Filled 2021-06-11: qty 100

## 2021-06-11 MED ORDER — ASPIRIN EC 81 MG PO TBEC
81.0000 mg | DELAYED_RELEASE_TABLET | Freq: Every day | ORAL | Status: DC
  Administered 2021-06-12: 81 mg via ORAL
  Filled 2021-06-11: qty 1

## 2021-06-11 MED ORDER — SODIUM CHLORIDE FLUSH 0.9 % IV SOLN
INTRAVENOUS | Status: AC
  Filled 2021-06-11: qty 20

## 2021-06-11 MED ORDER — ONDANSETRON HCL 4 MG/2ML IJ SOLN: 4.0000 mg | Freq: Once | INTRAMUSCULAR | Status: DC | PRN

## 2021-06-11 MED ORDER — ENOXAPARIN SODIUM 30 MG/0.3ML IJ SOSY
30.0000 mg | PREFILLED_SYRINGE | Freq: Two times a day (BID) | INTRAMUSCULAR | Status: DC
  Administered 2021-06-12: 30 mg via SUBCUTANEOUS
  Filled 2021-06-11: qty 0.3

## 2021-06-11 MED ORDER — ACETAMINOPHEN 10 MG/ML IV SOLN
INTRAVENOUS | Status: AC
  Administered 2021-06-11: 1000 mg via INTRAVENOUS
  Filled 2021-06-11: qty 100

## 2021-06-11 MED ORDER — FENTANYL CITRATE (PF) 100 MCG/2ML IJ SOLN
INTRAMUSCULAR | Status: DC | PRN
  Administered 2021-06-11 (×2): 12.5 ug via INTRAVENOUS
  Administered 2021-06-11: 50 ug via INTRAVENOUS

## 2021-06-11 MED ORDER — SODIUM CHLORIDE 0.9 % IR SOLN
Status: DC | PRN
  Administered 2021-06-11: 1004 mL

## 2021-06-11 MED ORDER — PROPOFOL 500 MG/50ML IV EMUL
INTRAVENOUS | Status: DC | PRN
  Administered 2021-06-11: 80 ug/kg/min via INTRAVENOUS

## 2021-06-11 MED ORDER — PHENYLEPHRINE HCL (PRESSORS) 10 MG/ML IV SOLN
INTRAVENOUS | Status: AC
  Filled 2021-06-11: qty 2

## 2021-06-11 MED ORDER — FENTANYL CITRATE (PF) 100 MCG/2ML IJ SOLN
INTRAMUSCULAR | Status: AC
  Filled 2021-06-11: qty 2

## 2021-06-11 MED ORDER — CEFAZOLIN SODIUM-DEXTROSE 2-4 GM/100ML-% IV SOLN
INTRAVENOUS | Status: AC
  Filled 2021-06-11: qty 100

## 2021-06-11 MED ORDER — DOCUSATE SODIUM 100 MG PO CAPS
100.0000 mg | ORAL_CAPSULE | Freq: Two times a day (BID) | ORAL | Status: DC
  Administered 2021-06-11 – 2021-06-12 (×2): 100 mg via ORAL
  Filled 2021-06-11 (×2): qty 1

## 2021-06-11 MED ORDER — NEOMYCIN-POLYMYXIN B GU 40-200000 IR SOLN
Status: AC
  Filled 2021-06-11: qty 20

## 2021-06-11 MED ORDER — BUPIVACAINE HCL (PF) 0.5 % IJ SOLN
INTRAMUSCULAR | Status: DC | PRN
  Administered 2021-06-11: 2.5 mL

## 2021-06-11 MED ORDER — DEXAMETHASONE SODIUM PHOSPHATE 10 MG/ML IJ SOLN
INTRAMUSCULAR | Status: DC | PRN
  Administered 2021-06-11: 5 mg via INTRAVENOUS

## 2021-06-11 MED ORDER — HYDROCHLOROTHIAZIDE 25 MG PO TABS
25.0000 mg | ORAL_TABLET | Freq: Every day | ORAL | Status: DC
  Administered 2021-06-12: 25 mg via ORAL
  Filled 2021-06-11: qty 1

## 2021-06-11 MED ORDER — EPHEDRINE SULFATE 50 MG/ML IJ SOLN
INTRAMUSCULAR | Status: DC | PRN
  Administered 2021-06-11: 5 mg via INTRAVENOUS

## 2021-06-11 MED ORDER — ACETAMINOPHEN 10 MG/ML IV SOLN
INTRAVENOUS | Status: DC | PRN
  Administered 2021-06-11: 1000 mg via INTRAVENOUS

## 2021-06-11 MED ORDER — MORPHINE SULFATE (PF) 2 MG/ML IV SOLN: 0.5000 mg | INTRAVENOUS | Status: DC | PRN

## 2021-06-11 MED ORDER — KETAMINE HCL 50 MG/5ML IJ SOSY
PREFILLED_SYRINGE | INTRAMUSCULAR | Status: AC
  Filled 2021-06-11: qty 5

## 2021-06-11 MED ORDER — DILTIAZEM HCL ER COATED BEADS 240 MG PO CP24
240.0000 mg | ORAL_CAPSULE | Freq: Every day | ORAL | Status: DC
  Administered 2021-06-12: 240 mg via ORAL
  Filled 2021-06-11: qty 1

## 2021-06-11 MED ORDER — SURGIPHOR WOUND IRRIGATION SYSTEM - OPTIME
TOPICAL | Status: DC | PRN
  Administered 2021-06-11: 1 via TOPICAL

## 2021-06-11 MED ORDER — KETAMINE HCL 10 MG/ML IJ SOLN
INTRAMUSCULAR | Status: DC | PRN
  Administered 2021-06-11: 20 mg via INTRAVENOUS

## 2021-06-11 MED ORDER — ORAL CARE MOUTH RINSE: 15.0000 mL | Freq: Once | OROMUCOSAL | Status: AC

## 2021-06-11 MED ORDER — FAMOTIDINE 20 MG PO TABS
ORAL_TABLET | ORAL | Status: AC
  Administered 2021-06-11: 20 mg via ORAL
  Filled 2021-06-11: qty 1

## 2021-06-11 MED ORDER — METHOCARBAMOL 500 MG PO TABS
500.0000 mg | ORAL_TABLET | Freq: Four times a day (QID) | ORAL | Status: DC | PRN
  Administered 2021-06-11 – 2021-06-12 (×2): 500 mg via ORAL
  Filled 2021-06-11 (×3): qty 1

## 2021-06-11 MED ORDER — PHENOL 1.4 % MT LIQD
1.0000 | OROMUCOSAL | Status: DC | PRN
  Filled 2021-06-11: qty 177

## 2021-06-11 MED ORDER — MAGNESIUM HYDROXIDE 400 MG/5ML PO SUSP
30.0000 mL | Freq: Every day | ORAL | Status: DC
  Administered 2021-06-11: 30 mL via ORAL
  Filled 2021-06-11: qty 30

## 2021-06-11 MED ORDER — ONDANSETRON HCL 4 MG/2ML IJ SOLN
INTRAMUSCULAR | Status: DC | PRN
  Administered 2021-06-11: 4 mg via INTRAVENOUS

## 2021-06-11 MED ORDER — TRAMADOL HCL 50 MG PO TABS
ORAL_TABLET | ORAL | Status: AC
  Filled 2021-06-11: qty 1

## 2021-06-11 MED ORDER — LISINOPRIL 20 MG PO TABS
20.0000 mg | ORAL_TABLET | Freq: Every day | ORAL | Status: DC
  Administered 2021-06-12: 20 mg via ORAL
  Filled 2021-06-11: qty 1

## 2021-06-11 MED ORDER — OXYCODONE HCL 5 MG/5ML PO SOLN
5.0000 mg | Freq: Once | ORAL | Status: DC | PRN
Start: 2021-06-11 — End: 2021-06-11

## 2021-06-11 MED ORDER — LACTATED RINGERS IV SOLN
INTRAVENOUS | Status: DC
Start: 2021-06-11 — End: 2021-06-11

## 2021-06-11 MED ORDER — TRAMADOL HCL 50 MG PO TABS
50.0000 mg | ORAL_TABLET | Freq: Four times a day (QID) | ORAL | Status: DC
  Administered 2021-06-11 – 2021-06-12 (×4): 50 mg via ORAL
  Filled 2021-06-11 (×4): qty 1

## 2021-06-11 MED ORDER — FLEET ENEMA 7-19 GM/118ML RE ENEM: 1.0000 | ENEMA | Freq: Once | RECTAL | Status: DC | PRN

## 2021-06-11 MED ORDER — LIDOCAINE HCL (PF) 2 % IJ SOLN
INTRAMUSCULAR | Status: AC
  Filled 2021-06-11: qty 5

## 2021-06-11 MED ORDER — PROPOFOL 10 MG/ML IV BOLUS
INTRAVENOUS | Status: AC
  Filled 2021-06-11: qty 20

## 2021-06-11 MED ORDER — ACETAMINOPHEN 325 MG PO TABS: 325.0000 mg | ORAL_TABLET | Freq: Four times a day (QID) | ORAL | Status: DC | PRN

## 2021-06-11 MED ORDER — FENTANYL CITRATE (PF) 100 MCG/2ML IJ SOLN
25.0000 ug | INTRAMUSCULAR | Status: DC | PRN
  Administered 2021-06-11: 50 ug via INTRAVENOUS

## 2021-06-11 MED ORDER — ACETAMINOPHEN 10 MG/ML IV SOLN
INTRAVENOUS | Status: AC
  Filled 2021-06-11: qty 100

## 2021-06-11 MED ORDER — CEFAZOLIN SODIUM-DEXTROSE 2-4 GM/100ML-% IV SOLN
INTRAVENOUS | Status: AC
  Administered 2021-06-11: 2 g via INTRAVENOUS
  Filled 2021-06-11: qty 100

## 2021-06-11 MED ORDER — ZOLPIDEM TARTRATE 5 MG PO TABS: 5.0000 mg | ORAL_TABLET | Freq: Every evening | ORAL | Status: DC | PRN

## 2021-06-11 MED ORDER — CEFAZOLIN SODIUM-DEXTROSE 2-4 GM/100ML-% IV SOLN
2.0000 g | Freq: Four times a day (QID) | INTRAVENOUS | Status: AC
  Administered 2021-06-11: 2 g via INTRAVENOUS
  Filled 2021-06-11: qty 100

## 2021-06-11 MED ORDER — ATORVASTATIN CALCIUM 10 MG PO TABS
10.0000 mg | ORAL_TABLET | Freq: Every day | ORAL | Status: DC
  Administered 2021-06-12: 10 mg via ORAL
  Filled 2021-06-11: qty 1

## 2021-06-11 MED ORDER — OXYCODONE HCL 5 MG PO TABS
5.0000 mg | ORAL_TABLET | Freq: Once | ORAL | Status: DC | PRN
Start: 2021-06-11 — End: 2021-06-11

## 2021-06-11 MED ORDER — MENTHOL 3 MG MT LOZG
1.0000 | LOZENGE | OROMUCOSAL | Status: DC | PRN
  Filled 2021-06-11: qty 9

## 2021-06-11 MED ORDER — CHLORHEXIDINE GLUCONATE 0.12 % MT SOLN
OROMUCOSAL | Status: AC
  Administered 2021-06-11: 15 mL via OROMUCOSAL
  Filled 2021-06-11: qty 15

## 2021-06-11 MED ORDER — ONDANSETRON HCL 4 MG/2ML IJ SOLN: 4.0000 mg | Freq: Four times a day (QID) | INTRAMUSCULAR | Status: DC | PRN

## 2021-06-11 MED ORDER — SODIUM CHLORIDE 0.9 % IR SOLN
Status: DC | PRN
  Administered 2021-06-11: 3012 mL

## 2021-06-11 MED ORDER — ACETAMINOPHEN 10 MG/ML IV SOLN: 1000.0000 mg | Freq: Once | INTRAVENOUS | Status: DC | PRN

## 2021-06-11 MED ORDER — SODIUM CHLORIDE (PF) 0.9 % IJ SOLN
INTRAMUSCULAR | Status: DC | PRN
  Administered 2021-06-11: 90 mL

## 2021-06-11 MED ORDER — BUPIVACAINE HCL (PF) 0.5 % IJ SOLN
INTRAMUSCULAR | Status: AC
  Filled 2021-06-11: qty 10

## 2021-06-11 MED ORDER — SODIUM CHLORIDE 0.9 % IV SOLN
INTRAVENOUS | Status: DC | PRN
  Administered 2021-06-11: 15 ug/min via INTRAVENOUS

## 2021-06-11 MED ORDER — METHOCARBAMOL 1000 MG/10ML IJ SOLN
500.0000 mg | Freq: Four times a day (QID) | INTRAVENOUS | Status: DC | PRN
  Filled 2021-06-11: qty 5

## 2021-06-11 MED ORDER — BISACODYL 10 MG RE SUPP: 10.0000 mg | Freq: Every day | RECTAL | Status: DC | PRN

## 2021-06-11 MED ORDER — HYDROCODONE-ACETAMINOPHEN 5-325 MG PO TABS
1.0000 | ORAL_TABLET | ORAL | Status: DC | PRN
  Administered 2021-06-12: 1 via ORAL
  Filled 2021-06-11: qty 1

## 2021-06-11 MED ORDER — ONDANSETRON HCL 4 MG/2ML IJ SOLN
INTRAMUSCULAR | Status: AC
  Filled 2021-06-11: qty 2

## 2021-06-11 MED ORDER — ONDANSETRON HCL 4 MG PO TABS: 4.0000 mg | ORAL_TABLET | Freq: Four times a day (QID) | ORAL | Status: DC | PRN

## 2021-06-11 MED ORDER — ALUM & MAG HYDROXIDE-SIMETH 200-200-20 MG/5ML PO SUSP: 30.0000 mL | ORAL | Status: DC | PRN

## 2021-06-11 MED ORDER — GLYCOPYRROLATE 0.2 MG/ML IJ SOLN
INTRAMUSCULAR | Status: DC | PRN
  Administered 2021-06-11: .2 mg via INTRAVENOUS

## 2021-06-11 MED ORDER — BUPIVACAINE-EPINEPHRINE (PF) 0.25% -1:200000 IJ SOLN
INTRAMUSCULAR | Status: AC
  Filled 2021-06-11: qty 30

## 2021-06-11 MED ORDER — SODIUM CHLORIDE 0.9 % IV SOLN: INTRAVENOUS | Status: DC

## 2021-06-11 MED ORDER — FAMOTIDINE 20 MG PO TABS: 20.0000 mg | ORAL_TABLET | Freq: Once | ORAL | Status: AC

## 2021-06-11 MED ORDER — POLYETHYLENE GLYCOL 3350 17 G PO PACK: 17.0000 g | PACK | Freq: Every day | ORAL | Status: DC | PRN

## 2021-06-11 MED ORDER — CHLORHEXIDINE GLUCONATE 0.12 % MT SOLN: 15.0000 mL | Freq: Once | OROMUCOSAL | Status: AC

## 2021-06-11 MED ORDER — LIDOCAINE HCL (CARDIAC) PF 100 MG/5ML IV SOSY
PREFILLED_SYRINGE | INTRAVENOUS | Status: DC | PRN
  Administered 2021-06-11: 50 mg via INTRAVENOUS

## 2021-06-11 SURGICAL SUPPLY — 74 items
ADAPTER OFFSET 3 (Connector) ×2 IMPLANT
BLADE SAGITTAL 25.0X1.19X90 (BLADE) ×2 IMPLANT
BLADE SAW 90X13X1.19 OSCILLAT (BLADE) ×2 IMPLANT
BLOCK CUTTING FEMUR 6 RT (MISCELLANEOUS) ×2 IMPLANT
BLOCK CUTTING TIBIAL 4 RT MIS (MISCELLANEOUS) ×2 IMPLANT
BLOCK CUTTING TIBIAL 6 RT (MISCELLANEOUS) ×2 IMPLANT
BNDG ELASTIC 6X5.8 VLCR STR LF (GAUZE/BANDAGES/DRESSINGS) ×2 IMPLANT
CANISTER WOUND CARE 500ML ATS (WOUND CARE) ×2 IMPLANT
CEMENT HV SMART SET (Cement) ×4 IMPLANT
CHLORAPREP W/TINT 26 (MISCELLANEOUS) ×4 IMPLANT
COOLER POLAR GLACIER W/PUMP (MISCELLANEOUS) ×2 IMPLANT
CUFF TOURN SGL QUICK 24 (TOURNIQUET CUFF)
CUFF TOURN SGL QUICK 34 (TOURNIQUET CUFF)
CUFF TRNQT CYL 24X4X16.5-23 (TOURNIQUET CUFF) IMPLANT
CUFF TRNQT CYL 34X4.125X (TOURNIQUET CUFF) IMPLANT
DRAPE 3/4 80X56 (DRAPES) ×4 IMPLANT
DRAPE INCISE IOBAN 66X60 STRL (DRAPES) ×2 IMPLANT
DRSG MEPILEX SACRM 8.7X9.8 (GAUZE/BANDAGES/DRESSINGS) ×2 IMPLANT
ELECT CAUTERY BLADE 6.4 (BLADE) ×2 IMPLANT
ELECT REM PT RETURN 9FT ADLT (ELECTROSURGICAL) ×2
ELECTRODE REM PT RTRN 9FT ADLT (ELECTROSURGICAL) ×1 IMPLANT
FEMORAL COMP CEMENTED SZ6 RT (Femur) ×2 IMPLANT
FEMUR BONE MODEL (MISCELLANEOUS) ×2 IMPLANT
GAUZE 4X4 16PLY ~~LOC~~+RFID DBL (SPONGE) ×2 IMPLANT
GAUZE SPONGE 4X4 12PLY STRL (GAUZE/BANDAGES/DRESSINGS) ×2 IMPLANT
GAUZE XEROFORM 1X8 LF (GAUZE/BANDAGES/DRESSINGS) ×2 IMPLANT
GLOVE SURG ORTHO LTX SZ8 (GLOVE) ×2 IMPLANT
GLOVE SURG SYN 9.0  PF PI (GLOVE) ×1
GLOVE SURG SYN 9.0 PF PI (GLOVE) ×1 IMPLANT
GLOVE SURG UNDER LTX SZ8 (GLOVE) ×2 IMPLANT
GLOVE SURG UNDER POLY LF SZ9 (GLOVE) ×2 IMPLANT
GOWN SRG 2XL LVL 4 RGLN SLV (GOWNS) ×1 IMPLANT
GOWN STRL NON-REIN 2XL LVL4 (GOWNS) ×1
GOWN STRL REUS W/ TWL LRG LVL3 (GOWN DISPOSABLE) ×1 IMPLANT
GOWN STRL REUS W/ TWL XL LVL3 (GOWN DISPOSABLE) ×1 IMPLANT
GOWN STRL REUS W/TWL LRG LVL3 (GOWN DISPOSABLE) ×1
GOWN STRL REUS W/TWL XL LVL3 (GOWN DISPOSABLE) ×1
HOLDER FOLEY CATH W/STRAP (MISCELLANEOUS) ×2 IMPLANT
INSERT TIBIAL FIXED SZ6 RIGHT (Insert) ×2 IMPLANT
IRRIGATION SURGIPHOR STRL (IV SOLUTION) IMPLANT
IV NS IRRIG 3000ML ARTHROMATIC (IV SOLUTION) ×2 IMPLANT
KIT PREVENA INCISION MGT20CM45 (CANNISTER) ×2 IMPLANT
KIT TURNOVER KIT A (KITS) ×2 IMPLANT
MANIFOLD NEPTUNE II (INSTRUMENTS) ×2 IMPLANT
NDL SAFETY ECLIPSE 18X1.5 (NEEDLE) ×1 IMPLANT
NEEDLE HYPO 18GX1.5 SHARP (NEEDLE) ×1
NEEDLE SPNL 18GX3.5 QUINCKE PK (NEEDLE) ×2 IMPLANT
NEEDLE SPNL 20GX3.5 QUINCKE YW (NEEDLE) ×2 IMPLANT
NS IRRIG 1000ML POUR BTL (IV SOLUTION) ×2 IMPLANT
PACK TOTAL KNEE (MISCELLANEOUS) ×2 IMPLANT
PAD WRAPON POLAR KNEE (MISCELLANEOUS) ×1 IMPLANT
PATELLA RESURFACING MEDACTA SZ (Bone Implant) ×2 IMPLANT
PENCIL SMOKE EVACUATOR COATED (MISCELLANEOUS) ×2 IMPLANT
PULSAVAC PLUS IRRIG FAN TIP (DISPOSABLE) ×2
SCALPEL PROTECTED #10 DISP (BLADE) ×4 IMPLANT
SPONGE T-LAP 18X18 ~~LOC~~+RFID (SPONGE) ×6 IMPLANT
STAPLER SKIN PROX 35W (STAPLE) ×2 IMPLANT
STEM EXTENSION 11MMX30MM (Stem) ×2 IMPLANT
SUCTION FRAZIER HANDLE 10FR (MISCELLANEOUS) ×1
SUCTION TUBE FRAZIER 10FR DISP (MISCELLANEOUS) ×1 IMPLANT
SUT DVC 2 QUILL PDO  T11 36X36 (SUTURE) ×1
SUT DVC 2 QUILL PDO T11 36X36 (SUTURE) ×1 IMPLANT
SUT ETHIBOND 2 V 37 (SUTURE) IMPLANT
SUT V-LOC 90 ABS DVC 3-0 CL (SUTURE) ×2 IMPLANT
SUT VICRYL 2-0 SH 8X27 (SUTURE) ×2 IMPLANT
SYR 20ML LL LF (SYRINGE) ×2 IMPLANT
SYR 50ML LL SCALE MARK (SYRINGE) ×4 IMPLANT
TIP FAN IRRIG PULSAVAC PLUS (DISPOSABLE) ×1 IMPLANT
TOWEL OR 17X26 4PK STRL BLUE (TOWEL DISPOSABLE) ×2 IMPLANT
TOWER CARTRIDGE SMART MIX (DISPOSABLE) ×2 IMPLANT
TRAY FOLEY MTR SLVR 16FR STAT (SET/KITS/TRAYS/PACK) ×2 IMPLANT
TRAY TIBIAL FIXED SZ6 RT CEMEN (Insert) ×2 IMPLANT
WATER STERILE IRR 500ML POUR (IV SOLUTION) ×2 IMPLANT
WRAPON POLAR PAD KNEE (MISCELLANEOUS) ×2

## 2021-06-11 NOTE — Plan of Care (Signed)
Pt admitted  with RTH replacement. Alert and oriented. Vital signs stable. Foley in place draining yellow clear urine. Bone foam to RLE. Denies pain. Wife at bedside. Staff will continue to monitor pt

## 2021-06-11 NOTE — Transfer of Care (Signed)
Immediate Anesthesia Transfer of Care Note  Patient: Nissan Frazzini Ledford  Procedure(s) Performed: TOTAL KNEE ARTHROPLASTY (Right: Knee)  Patient Location: PACU  Anesthesia Type:Spinal  Level of Consciousness: drowsy  Airway & Oxygen Therapy: Patient Spontanous Breathing and Patient connected to face mask oxygen  Post-op Assessment: Report given to RN and Post -op Vital signs reviewed and stable  Post vital signs: Reviewed and stable  Last Vitals:  Vitals Value Taken Time  BP 131/68 06/11/21 0947  Temp    Pulse 68 06/11/21 0949  Resp 16 06/11/21 0949  SpO2 98 % 06/11/21 0949  Vitals shown include unvalidated device data.  Last Pain:  Vitals:   06/11/21 0635  TempSrc: Temporal  PainSc: 3          Complications: No notable events documented.

## 2021-06-11 NOTE — H&P (Signed)
Chief Complaint  Patient presents with   Pre-op Exam  Scheduled for Right TKA 06/11/21   [name] Goodman is a 80 y.o. male who presents today for history and physical for right total knee arthroplasty with Dr. Kennedy Bucker on 06/11/2021. Patient has had years of progressive right knee pain. Patient underwent right knee arthroscopy for partial medial lateral meniscectomy in February of this year which gave him some mild relief but continued to have arthritic knee pain. Patient describes moderate to severe anterior and patellofemoral knee pain that is worse with walking. He tries to be very active playing golf and walking 5 miles a day but is unable to walk 1 mile a day due to pain swelling and limping. He has tried injection therapy with no relief. He takes some over-the-counter medications with no relief. He has underwent a successful left total knee arthroplasty. Pain interferes with his quality of life and activities of daily living.  Past Medical History: Past Medical History:  Diagnosis Date   Adenomatous colon polyp, unspecified   BPH (benign prostatic hypertrophy)   Hyperlipidemia   Hypertension   Past Surgical History: Past Surgical History:  Procedure Laterality Date   APPENDECTOMY   ARTHROPLASTY TOTAL KNEE Left 07/23/2015  [facility]   COLONOSCOPY 06/2013  5 yrs   KNEE ARTHROSCOPY Right 11/13/2020  Medial and Lateral Menisectomy-[facility] Willis   Laser surgery as well as lithotripsy for kidney stone retrieval 04/2009, followed by Dr. Lonna Cobb   Left Knee Arthroscopy, excision of plica and removal of loose body 07/31/2011   Past Family History: Family History  Problem Relation Age of Onset   Lung cancer Mother   Myocardial Infarction (Heart attack) Father   Diabetes Father   Cancer Father   Stroke Sister 67  d   Medications: Current Outpatient Medications Ordered in Epic  Medication Sig Dispense Refill   aspirin 81 MG EC tablet Take 81 mg by mouth once daily.   atorvastatin  (LIPITOR) 10 MG tablet Take 1 tablet (10 mg total) by mouth once daily 90 tablet 3   diltiazem (TIAZAC) 240 MG ER capsule Take 1 capsule (240 mg total) by mouth once daily 90 capsule 3   fosinopriL (MONOPRIL) 20 MG tablet Take 1 tablet (20 mg total) by mouth once daily 90 tablet 3   hydroCHLOROthiazide (HYDRODIURIL) 25 MG tablet Take 1 tablet (25 mg total) by mouth once daily 90 tablet 3   No current Epic-ordered facility-administered medications on file.   Allergies: No Known Allergies   Review of Systems:  A comprehensive 14 point ROS was performed, reviewed by me today, and the pertinent orthopaedic findings are documented in the HPI.  Exam: BP 122/80  Wt 97.1 kg (214 lb)  BMI 28.23 kg/m  General:  Well developed, well nourished, no apparent distress, normal affect, normal gait with no antalgic component.   HEENT: Head normocephalic, atraumatic, PERRL.   Abdomen: Soft, non tender, non distended, Bowel sounds present.  Heart: Examination of the heart reveals regular, rate, and rhythm. There is no murmur noted on ascultation. There is a normal apical pulse.  Lungs: Lungs are clear to auscultation. There is no wheeze, rhonchi, or crackles. There is normal expansion of bilateral chest walls.   Right knee: Examination of the right knee shows no warmth, erythema. Moderate effusion. Is tender along the medial lateral and patellofemoral region. Some crepitus is noted along the patellofemoral compartment with knee range of motion. He has 10 to 95 degrees range of motion with no laxity  with valgus or varus stress testing. No pain with hip internal X rotation. No swelling warmth erythema or edema throughout the right lower extremity.  EXAM:  CT OF THE RIGHT KNEE WITHOUT CONTRAST   TECHNIQUE:  Multidetector CT imaging of the right knee was performed according  to the standard protocol. Multiplanar CT image reconstructions were  also generated. Examination was performed utilizing the  my knee  protocol which includes axial images of the hip and ankle.   COMPARISON:  04/17/2021   FINDINGS:  Bones/Joint/Cartilage   Right knee: No acute fracture or dislocation. Moderate to severe  tricompartmental osteoarthritis. Small-moderate-sized knee joint  effusion.   Right hip: No acute fracture or dislocation. No evidence of femoral  head avascular necrosis. There is mild-to-moderate diffuse joint  space narrowing of the right hip joint.   Right ankle: No acute fracture or dislocation. No apparent talar  dome osteochondral lesion.   Ligaments   Suboptimally assessed by CT.   Muscles and Tendons   No acute musculotendinous findings by CT.   Soft tissues   No fluid collections. Prominent vascular calcifications.  Prostatomegaly.   IMPRESSION:  1. Moderate to severe tricompartmental osteoarthritis of the right  knee with small-moderate-sized knee joint effusion.  2. Prostatomegaly.   Electronically Signed    By: Duanne Guess D.O.    On: 04/30/2021 16:34  Procedure Note  Impression: Primary osteoarthritis of right knee [M17.11] Primary osteoarthritis of right knee (primary encounter diagnosis)  Plan:  30. 80 year old male with severe tricompartmental osteoarthritis. He has pain, swelling that is interfering with quality of life and activities of daily living. He has lost knee range of motion which interferes with his ability to walk short distances. Risks, benefits, complications of a right total knee arthroplasty have been discussed with the patient. Patient has agreed and consented procedure with Dr. Kennedy Bucker on 06/11/2021.  This note was generated in part with voice recognition software and I apologize for any typographical errors that were not detected and corrected.  Patience Musca MPA-C   Electronically signed by Patience Musca, PA at 06/03/2021 9:49 AM EDT  Reviewed  H+P. No changes noted.

## 2021-06-11 NOTE — Anesthesia Procedure Notes (Addendum)
Spinal  Patient location during procedure: OR Start time: 06/11/2021 7:22 AM End time: 06/11/2021 7:24 AM Reason for block: surgical anesthesia Staffing Performed: resident/CRNA  Anesthesiologist: Corinda Gubler, MD Resident/CRNA: Alanson Puls, RN Preanesthetic Checklist Completed: patient identified, IV checked, site marked, risks and benefits discussed, surgical consent, monitors and equipment checked, pre-op evaluation and timeout performed Spinal Block Patient position: sitting Prep: ChloraPrep Patient monitoring: heart rate, continuous pulse ox and blood pressure Approach: midline Location: L4-5 Injection technique: single-shot Needle Needle type: Pencan  Needle gauge: 24 G Needle length: 10 cm Assessment Events: CSF return

## 2021-06-11 NOTE — Op Note (Signed)
06/11/2021  9:56 AM  PATIENT:  [name] Goodman   MRN: [id number]  PRE-OPERATIVE DIAGNOSIS:  Primary localized osteoarthritis of right knee   POST-OPERATIVE DIAGNOSIS:  Same   PROCEDURE:  Procedure(s): Right TOTAL KNEE ARTHROPLASTY   SURGEON: Leitha Schuller, MD   ASSISTANTS: Cranston Neighbor, PA-C   ANESTHESIA:   spinal   EBL: 200   BLOOD ADMINISTERED:none   DRAINS:  Incisional wound VAC     LOCAL MEDICATIONS USED:  MARCAINE    and OTHER Exparel   SPECIMEN:  No Specimen   DISPOSITION OF SPECIMEN:  N/A   COUNTS:  YES   TOURNIQUET: 26 minutes at 300 mm Hg   IMPLANTS: Medacta  GMK sphere system with 6 femur, 6 right tibia with 3 mm offset and short stem and 10 mm insert.  Size 3 patella, all components cemented.   DICTATION: Reubin Milan Dictation   patient was brought to the operating room and spinal anesthesia was obtained.  After prepping and draping the right leg in sterile fashion, and after patient identification and timeout procedures were completed, tourniquet was raised  and midline skin incision was made followed by medial parapatellar arthrotomy with severe medial compartment osteoarthritis, severe patellofemoral arthritis and advanced lateral compartment arthritis, partial synovectomy was also carried out.   The ACL and PCL and fat pad were excised along with anterior horns of the meniscus. The proximal tibia cutting guide from  the [facility] system was applied and the proximal tibia cut carried out.  The distal femoral cut was carried out in a similar fashion     The 6 femoral cutting guide applied with anterior posterior and chamfer cuts made.  The posterior horns of the menisci were removed at this point.   Injection of the above medication was carried out after the femoral and tibial cuts were carried out.  The 6 right baseplate trial was placed pinned into position and proximal tibial preparation carried out with drilling hand reaming and the keel punch along with a 3 mm  offset because of preop templating showing this is necessary.  Followed by placement of the 6 right femur and sizing the tibial insert size 10 millimeter gave the best fit with stability and full extension.  The distal femoral drill holes were made in the notch cut for the trochlear groove was then carried out with trials were then removed the patella was cut using the patellar cutting guide and it sized to a size 3 after drill holes have been made  The knee was irrigated with pulsatile lavage and the bony surfaces dried the tibial component was cemented into place first.  Excess cement was removed and the polyethylene insert placed with a torque screw placed with a torque screwdriver tightened.  The distal femoral component was placed and the knee was held in extension as the patellar button was clamped into place.  After the cement was set, excess cement was removed and the knee was again irrigated thoroughly thoroughly irrigated.  The tourniquet was let down and hemostasis checked with electrocautery. The arthrotomy was repaired with a heavy Quill suture,  followed by 3-0 V lock subcuticular closure, skin staples followed by incisional wound VAC and Polar Care.Marland Kitchen   PLAN OF CARE: Admit for overnight observation   PATIENT DISPOSITION:  PACU - hemodynamically stable.

## 2021-06-11 NOTE — Anesthesia Preprocedure Evaluation (Signed)
Anesthesia Evaluation  Patient identified by MRN, date of birth, ID band Patient awake    Reviewed: Allergy & Precautions, NPO status , Patient's Chart, lab work & pertinent test results  History of Anesthesia Complications Negative for: history of anesthetic complications  Airway Mallampati: III  TM Distance: >3 FB Neck ROM: Full    Dental no notable dental hx. (+) Teeth Intact, Caps   Pulmonary neg pulmonary ROS, neg sleep apnea, neg COPD, Patient abstained from smoking.Not current smoker, former smoker,    Pulmonary exam normal breath sounds clear to auscultation       Cardiovascular Exercise Tolerance: Good METShypertension, Pt. on medications (-) CAD and (-) Past MI (-) dysrhythmias  Rhythm:Regular Rate:Normal - Systolic murmurs    Neuro/Psych negative neurological ROS  negative psych ROS   GI/Hepatic neg GERD  ,(+)     (-) substance abuse  ,   Endo/Other  neg diabetes  Renal/GU negative Renal ROS     Musculoskeletal  (+) Arthritis , Osteoarthritis,    Abdominal   Peds  Hematology   Anesthesia Other Findings Past Medical History: No date: Arthritis No date: History of kidney stones No date: Hypertension No date: Nocturia  Reproductive/Obstetrics                             Anesthesia Physical Anesthesia Plan  ASA: 2  Anesthesia Plan: Spinal   Post-op Pain Management:    Induction: Intravenous  PONV Risk Score and Plan: 0 and Ondansetron, Dexamethasone, Propofol infusion, TIVA and Treatment may vary due to age or medical condition  Airway Management Planned: Natural Airway  Additional Equipment: None  Intra-op Plan:   Post-operative Plan:   Informed Consent: I have reviewed the patients History and Physical, chart, labs and discussed the procedure including the risks, benefits and alternatives for the proposed anesthesia with the patient or authorized  representative who has indicated his/her understanding and acceptance.       Plan Discussed with: CRNA and Surgeon  Anesthesia Plan Comments: (Discussed R/B/A of neuraxial anesthesia technique with patient: - rare risks of spinal/epidural hematoma, nerve damage, infection - Risk of PDPH - Risk of nausea and vomiting - Risk of conversion to general anesthesia and its associated risks, including sore throat, damage to lips/teeth/oropharynx, and rare risks such as cardiac and respiratory events. - Risk of allergic reactions  Patient voiced understanding.)        Anesthesia Quick Evaluation

## 2021-06-11 NOTE — Plan of Care (Signed)
  Problem: Education: Goal: Knowledge of the prescribed therapeutic regimen will improve Outcome: Progressing   Problem: Activity: Goal: Ability to avoid complications of mobility impairment will improve Outcome: Progressing   Problem: Pain Management: Goal: Pain level will decrease with appropriate interventions Outcome: Progressing   Problem: Skin Integrity: Goal: Will show signs of wound healing Outcome: Progressing   Problem: Education: Goal: Knowledge of General Education information will improve Description: Including pain rating scale, medication(s)/side effects and non-pharmacologic comfort measures Outcome: Progressing   

## 2021-06-11 NOTE — Progress Notes (Signed)
Patient is doing great, called [name] Goodman significant other to notify her of the room number and patient status, have been updating throughout the day.

## 2021-06-11 NOTE — Evaluation (Signed)
Physical Therapy Evaluation Patient Details Name: [name] Goodman MRN: [id number] DOB: [birth year] Today's Date: 06/11/2021  History of Present Illness  Pt underwent R TKA with Dr. Rosita Kea on 06/11/2021. Pt has PMH of benign prostatic hypertrophy, hyperlipidemia, hypertension. Past surgical history of L TKA in 2016 and R knee arthoscopy in feb 2022.  Clinical Impression  Pt is a pleasant 80 year old male who is POD#0 of R TKA and is currently WBAT. Pt reports that he currently lives alone, was independent prior to surgery, and will have support of significant other 24/7 at discharge. Pt was able to perform supine exercises and demonstrates good understanding. Pt able to complete bed mobility with SPV, sit to stand transfer with RW +CGA for balance, and ambulate 15 ft with RW + CGA and verbal cueing for proper sequencing. Pt educated on post-op recommendations for home health PT with eventual referral to outpatient PT and pt in agreeance. Plan to trial stair training tomorrow for safe home entry. Pt will benefit from skilled PT services to reduce risk of falls and prepare for safe discharge home with assistance.      Recommendations for follow up therapy are one component of a multi-disciplinary discharge planning process, led by the attending physician.  Recommendations may be updated based on patient status, additional functional criteria and insurance authorization.  Follow Up Recommendations Home health PT;Supervision for mobility/OOB    Equipment Recommendations  Rolling walker with 5" wheels    Recommendations for Other Services       Precautions / Restrictions Precautions Precautions: Fall;Knee Precaution Booklet Issued: No Restrictions Weight Bearing Restrictions: Yes RLE Weight Bearing: Weight bearing as tolerated      Mobility  Bed Mobility Overal bed mobility: Needs Assistance Bed Mobility: Supine to Sit;Sit to Supine     Supine to sit: Min assist Sit to supine:  Supervision   General bed mobility comments: MinA with movement of R knee back into bed. Pt required usage of hand rails coming from supine ot sit.    Transfers Overall transfer level: Needs assistance Equipment used: Rolling walker (2 wheeled) Transfers: Sit to/from Stand Sit to Stand: Min guard;From elevated surface         General transfer comment: Verbal cues for forward trunk lean and hand placement on bed prior to standing. Pt requiring CGA for balance.  Ambulation/Gait Ambulation/Gait assistance: Min guard Gait Distance (Feet): 15 Feet Assistive device: Rolling walker (2 wheeled) Gait Pattern/deviations: Step-to pattern;Antalgic Gait velocity: decreased   General Gait Details: Pt educated on proper sequencing of RW and B LEs and usage of UEs to off load pressure on R knee. No evidence of R knee buckling in stance. Additional verbal cues for preventing pivoting/twisting on R knee with turns  Stairs            Wheelchair Mobility    Modified Rankin (Stroke Patients Only)       Balance Overall balance assessment: Needs assistance Sitting-balance support: Feet supported;No upper extremity supported Sitting balance-Leahy Scale: Normal     Standing balance support: Bilateral upper extremity supported;During functional activity Standing balance-Leahy Scale: Fair Standing balance comment: Required CGA during dynamic and static standing with RW. no LOB noted throughout gait trial           Pertinent Vitals/Pain Pain Assessment: Faces Faces Pain Scale: Hurts a little bit Pain Location: R knee Pain Descriptors / Indicators: Aching Pain Intervention(s): Limited activity within patient's tolerance;Monitored during session    Home Living Family/patient expects to be  discharged to:: Private residence Living Arrangements: Alone Available Help at Discharge: Family Type of Home: House Home Access: Stairs to enter Entrance Stairs-Rails: Left Entrance  Stairs-Number of Steps: 3 Home Layout: One level Home Equipment: Walker - 4 wheels;Cane - single point Additional Comments: Pt reports his significant other will be available 24/7 at discharge and will be staying at him at home. Pt reports owning a raised toilet seat at home    Prior Function Level of Independence: Independent         Comments: Pt reports not needing any assistance devices for household and community ambulation. Pt reports that he owns a rollator     Hand Dominance        Extremity/Trunk Assessment   Upper Extremity Assessment Upper Extremity Assessment: Overall WFL for tasks assessed    Lower Extremity Assessment Lower Extremity Assessment: Overall WFL for tasks assessed;RLE deficits/detail RLE Deficits / Details: Grossly 3/5 with functional mobility. Not formally tested due to surgical intervention. RLE Sensation: WNL RLE Coordination: WNL       Communication   Communication: No difficulties  Cognition Arousal/Alertness: Awake/alert Behavior During Therapy: WFL for tasks assessed/performed Overall Cognitive Status: Within Functional Limits for tasks assessed          General Comments: Pt A&O x 4      General Comments      Exercises Total Joint Exercises Ankle Circles/Pumps: AROM;Both;20 reps;Supine Quad Sets: AROM;Right;10 reps;Supine Heel Slides: AROM;10 reps;Right Hip ABduction/ADduction: AROM;Right;10 reps;Supine Straight Leg Raises: AROM;5 reps;Supine;Right Goniometric ROM: 5-90   Assessment/Plan    PT Assessment Patient needs continued PT services  PT Problem List Decreased strength;Decreased range of motion;Decreased activity tolerance;Decreased balance;Decreased mobility;Decreased knowledge of use of DME;Pain       PT Treatment Interventions DME instruction;Gait training;Stair training;Functional mobility training;Therapeutic activities;Therapeutic exercise;Balance training;Neuromuscular re-education;Patient/family  education;Modalities    PT Goals (Current goals can be found in the Care Plan section)  Acute Rehab PT Goals Patient Stated Goal: to get back to walking PT Goal Formulation: With patient Time For Goal Achievement: 06/25/21 Potential to Achieve Goals: Good    Frequency BID   Barriers to discharge        Co-evaluation               AM-PAC PT "6 Clicks" Mobility  Outcome Measure Help needed turning from your back to your side while in a flat bed without using bedrails?: None Help needed moving from lying on your back to sitting on the side of a flat bed without using bedrails?: None Help needed moving to and from a bed to a chair (including a wheelchair)?: A Little Help needed standing up from a chair using your arms (e.g., wheelchair or bedside chair)?: A Little Help needed to walk in hospital room?: A Little Help needed climbing 3-5 steps with a railing? : A Lot 6 Click Score: 19    End of Session Equipment Utilized During Treatment: Gait belt Activity Tolerance: Patient tolerated treatment well Patient left: in bed;with call bell/phone within reach;with bed alarm set;with nursing/sitter in room;with SCD's reapplied;Other (comment) (Ice reapplied with towel rolls under B ankles) Nurse Communication: Mobility status;Weight bearing status PT Visit Diagnosis: Unsteadiness on feet (R26.81);Muscle weakness (generalized) (M62.81)    Time: 2536-6440 PT Time Calculation (min) (ACUTE ONLY): 41 min   Charges:              Verl Blalock, SPT   Verl Blalock 06/11/2021, 4:35 PM

## 2021-06-12 ENCOUNTER — Encounter: Payer: Self-pay | Admitting: Orthopedic Surgery

## 2021-06-12 DIAGNOSIS — Z96651 Presence of right artificial knee joint: Secondary | ICD-10-CM | POA: Diagnosis not present

## 2021-06-12 DIAGNOSIS — M1711 Unilateral primary osteoarthritis, right knee: Secondary | ICD-10-CM | POA: Diagnosis not present

## 2021-06-12 LAB — BASIC METABOLIC PANEL
Anion gap: 7 (ref 5–15)
BUN: 19 mg/dL (ref 8–23)
CO2: 26 mmol/L (ref 22–32)
Calcium: 8.3 mg/dL — ABNORMAL LOW (ref 8.9–10.3)
Chloride: 104 mmol/L (ref 98–111)
Creatinine, Ser: 0.97 mg/dL (ref 0.61–1.24)
GFR, Estimated: >60 mL/min (ref >60)
Glucose, Bld: 187 mg/dL — ABNORMAL HIGH (ref 70–99)
Potassium: 4 mmol/L (ref 3.5–5.1)
Sodium: 137 mmol/L (ref 135–145)

## 2021-06-12 LAB — CBC
HCT: 38.5 % — ABNORMAL LOW (ref 39.0–52.0)
Hemoglobin: 13.1 g/dL (ref 13.0–17.0)
MCH: 30.4 pg (ref 26.0–34.0)
MCHC: 34 g/dL (ref 30.0–36.0)
MCV: 89.3 fL (ref 80.0–100.0)
Platelets: 153 10*3/uL (ref 150–400)
RBC: 4.31 MIL/uL (ref 4.22–5.81)
RDW: 13 % (ref 11.5–15.5)
WBC: 12.3 10*3/uL — ABNORMAL HIGH (ref 4.0–10.5)
nRBC: 0 % (ref 0.0–0.2)

## 2021-06-12 MED ORDER — TRAMADOL HCL 50 MG PO TABS: 50.0000 mg | ORAL_TABLET | Freq: Four times a day (QID) | ORAL | 0 refills | Status: DC | PRN

## 2021-06-12 MED ORDER — HYDROCODONE-ACETAMINOPHEN 5-325 MG PO TABS: 1.0000 | ORAL_TABLET | ORAL | 0 refills | Status: DC | PRN

## 2021-06-12 MED ORDER — POLYETHYLENE GLYCOL 3350 17 G PO PACK: 17.0000 g | PACK | Freq: Every day | ORAL | 0 refills | Status: DC | PRN

## 2021-06-12 MED ORDER — DOCUSATE SODIUM 100 MG PO CAPS: 100.0000 mg | ORAL_CAPSULE | Freq: Two times a day (BID) | ORAL | 0 refills | Status: DC

## 2021-06-12 MED ORDER — ENOXAPARIN SODIUM 40 MG/0.4ML IJ SOSY: 40.0000 mg | PREFILLED_SYRINGE | INTRAMUSCULAR | 0 refills | Status: DC

## 2021-06-12 NOTE — Anesthesia Postprocedure Evaluation (Signed)
Anesthesia Post Note  Patient: [name] Goodman  Procedure(s) Performed: TOTAL KNEE ARTHROPLASTY (Right: Knee)  Patient location during evaluation: Nursing Unit Anesthesia Type: Spinal Level of consciousness: oriented and awake and alert Pain management: pain level controlled Vital Signs Assessment: post-procedure vital signs reviewed and stable Respiratory status: spontaneous breathing and respiratory function stable Cardiovascular status: blood pressure returned to baseline and stable Postop Assessment: no headache, no backache, no apparent nausea or vomiting and patient able to bend at knees Anesthetic complications: no   No notable events documented.   Last Vitals:  Vitals:   06/11/21 2013 06/12/21 0424  BP: (!) 161/80 138/74  Pulse: 98 66  Resp: 18 18  Temp: 36.8 C 36.9 C  SpO2: 98% 97%    Last Pain:  Vitals:   06/12/21 0733  TempSrc:   PainSc: 3                  Rosanne Gutting

## 2021-06-12 NOTE — Progress Notes (Signed)
Met with the patient in the room at the bedside, He has transportation and can afford his medications, His fiance will be staying with him for a coupl of weeks to help, he will have additional help from his daughter and friends he needs a RW and it will be delivered to the room by [employer] prior to DC He is set up with [place] for [employer] services

## 2021-06-12 NOTE — Progress Notes (Signed)
   Subjective: 1 Day Post-Op Procedure(s) (LRB): TOTAL KNEE ARTHROPLASTY (Right) Patient reports pain as mild.   Patient is well, and has had no acute complaints or problems Denies any CP, SOB, ABD pain. We will continue therapy today.  Plan is to go Home after hospital stay.  Objective: Vital signs in last 24 hours: Temp:  [97.7 F (36.5 C)-98.5 F (36.9 C)] 98.4 F (36.9 C) (09/21 0424) Pulse Rate:  [52-98] 71 (09/21 0915) Resp:  [12-18] 17 (09/21 0915) BP: (104-161)/(58-86) 155/72 (09/21 0915) SpO2:  [93 %-99 %] 93 % (09/21 0915) Weight:  [96.2 kg] 96.2 kg (09/20 1741)  Intake/Output from previous day: 09/20 0701 - 09/21 0700 In: 2905.8 [P.O.:360; I.V.:2045.8; IV Piggyback:500] Out: 2600 [Urine:2400; Blood:200] Intake/Output this shift: No intake/output data recorded.  Recent Labs    06/11/21 1708 06/12/21 0403  HGB 14.4 13.1   Recent Labs    06/11/21 1708 06/12/21 0403  WBC 14.3* 12.3*  RBC 4.50 4.31  HCT 40.8 38.5*  PLT 147* 153   Recent Labs    06/11/21 1708 06/12/21 0403  NA  --  137  K  --  4.0  CL  --  104  CO2  --  26  BUN  --  19  CREATININE 1.20 0.97  GLUCOSE  --  187*  CALCIUM  --  8.3*   No results for input(s): LABPT, INR in the last 72 hours.  EXAM General - Patient is Alert, Appropriate, and Oriented Extremity - Neurovascular intact Sensation intact distally Intact pulses distally Dorsiflexion/Plantar flexion intact No cellulitis present Compartment soft Dressing - dressing C/D/I and no drainage, Praveena intact without drainage Motor Function - intact, moving foot and toes well on exam.   Past Medical History:  Diagnosis Date   Arthritis    History of kidney stones    Hypertension    Nocturia     Assessment/Plan:   1 Day Post-Op Procedure(s) (LRB): TOTAL KNEE ARTHROPLASTY (Right) Active Problems:   S/P TKR (total knee replacement) using cement, right  Estimated body mass index is 28.75 kg/m as calculated from the  following:   Height as of this encounter: 6' (1.829 m).   Weight as of this encounter: 96.2 kg. Advance diet Up with therapy Work on bowel movement Pain well controlled Labs and vital signs are stable Care management to assist with discharge to home with home health PT pending completion of PT goals   DVT Prophylaxis - Lovenox, TED hose, and foot pumps Weight-Bearing as tolerated to right leg   T. Cranston Neighbor, PA-C [facility] 06/12/2021, 9:17 AM

## 2021-06-12 NOTE — Evaluation (Signed)
Occupational Therapy Evaluation Patient Details Name: [name] Goodman MRN: [id number] DOB: 1940-11-18 Today's Date: 06/12/2021   History of Present Illness Pt underwent R TKA with Dr. Rosita Kea on 06/11/2021. Pt has PMH of benign prostatic hypertrophy, hyperlipidemia, hypertension. Past surgical history of L TKA in 2016 and R knee arthoscopy in feb 2022.   Clinical Impression   Mr Panico was seen for OT evaluation this date. Prior to hospital admission, pt was Independent for mobility and ADLs. Pt lives alone, plan for significant other to stay 24/7. Pt and partner instructed in polar care mgt, falls prevention strategies, home/routines modifications, DME/AE for LB bathing and dressing tasks, and compression stocking mgt. Pt requires SUPERVISION + RW for toilet t/f and urinating in standing - assist for lines mgmt only. MOD I don mesh underwear.All education complete, no further skilled acute OT needs identified. Will sign off. Do not currently anticipate any OT needs following this hospitalization.         Recommendations for follow up therapy are one component of a multi-disciplinary discharge planning process, led by the attending physician.  Recommendations may be updated based on patient status, additional functional criteria and insurance authorization.   Follow Up Recommendations  No OT follow up    Equipment Recommendations  None recommended by OT    Recommendations for Other Services       Precautions / Restrictions Precautions Precautions: Fall;Knee Precaution Booklet Issued: Yes (comment) Precaution Comments: wound vac Restrictions Weight Bearing Restrictions: Yes RLE Weight Bearing: Weight bearing as tolerated      Mobility Bed Mobility Overal bed mobility: Needs Assistance Bed Mobility: Supine to Sit     Supine to sit: Supervision     General bed mobility comments: received and left in chair    Transfers Overall transfer level: Modified  independent Equipment used: Rolling walker (2 wheeled) Transfers: Sit to/from Stand Sit to Stand: Min guard         General transfer comment: vc's for UE/LE placement; increased effort to stand from bed but steady and safe    Balance Overall balance assessment: Needs assistance Sitting-balance support: Feet supported;No upper extremity supported Sitting balance-Leahy Scale: Normal Sitting balance - Comments: steady sitting reaching outside BOS   Standing balance support: No upper extremity supported;During functional activity Standing balance-Leahy Scale: Good Standing balance comment: toileting standing with no UE support and no LOBs                           ADL either performed or assessed with clinical judgement   ADL Overall ADL's : Needs assistance/impaired                                       General ADL Comments: SUPERVISION + RW for toilet t/f and urinating in standing - assist for lines mgmt only. MOD I don mesh underwear.      Pertinent Vitals/Pain Pain Assessment: Faces Faces Pain Scale: Hurts a little bit Pain Location: R knee Pain Descriptors / Indicators: Sore Pain Intervention(s): Limited activity within patient's tolerance;Repositioned     Hand Dominance     Extremity/Trunk Assessment Upper Extremity Assessment Upper Extremity Assessment: Overall WFL for tasks assessed   Lower Extremity Assessment Lower Extremity Assessment: Overall WFL for tasks assessed       Communication Communication Communication: No difficulties   Cognition Arousal/Alertness: Awake/alert Behavior During Therapy: [facility]  for tasks assessed/performed Overall Cognitive Status: Within Functional Limits for tasks assessed                                 General Comments: Pt A&O x 4   General Comments  wound vac in place R LE    Exercises Exercises: Other exercises Other Exercises Other Exercises: Pt educated re: OT role, DME  recs, d/c recs, falls prevention, polar care mgmt, adapted dressing strategies Other Exercises: LBD, toileting, sit<>stand x2, sitting/standing balance/tolerance   Shoulder Instructions      Home Living Family/patient expects to be discharged to:: Private residence Living Arrangements: Alone Available Help at Discharge: Family Type of Home: House Home Access: Stairs to enter Secretary/administrator of Steps: 3 Entrance Stairs-Rails: Left Home Layout: One level     Bathroom Shower/Tub: Chief Strategy Officer: Handicapped height     Home Equipment: Environmental consultant - 4 wheels;Cane - single point   Additional Comments: Pt reports his significant other will be available 24/7 at discharge and will be staying at him at home. Pt reports owning a raised toilet seat at home      Prior Functioning/Environment Level of Independence: Independent        Comments: Pt reports not needing any assistance devices for household and community ambulation        OT Problem List: Decreased range of motion;Decreased activity tolerance;Decreased strength         OT Goals(Current goals can be found in the care plan section) Acute Rehab OT Goals Patient Stated Goal: to get back to walking OT Goal Formulation: With patient/family Time For Goal Achievement: 06/26/21 Potential to Achieve Goals: Good   AM-PAC OT "6 Clicks" Daily Activity     Outcome Measure Help from another person eating meals?: None Help from another person taking care of personal grooming?: A Little Help from another person toileting, which includes using toliet, bedpan, or urinal?: A Little Help from another person bathing (including washing, rinsing, drying)?: A Little Help from another person to put on and taking off regular upper body clothing?: None Help from another person to put on and taking off regular lower body clothing?: None 6 Click Score: 21   End of Session Equipment Utilized During Treatment: Rolling  walker  Activity Tolerance: Patient tolerated treatment well Patient left: in chair;with call bell/phone within reach;with chair alarm set;with family/visitor present  OT Visit Diagnosis: Other abnormalities of gait and mobility (R26.89)                Time: 3335-4562 OT Time Calculation (min): 24 min Charges:  OT General Charges $OT Visit: 1 Visit OT Evaluation $OT Eval Low Complexity: 1 Low OT Treatments $Self Care/Home Management : 8-22 mins  Kathie Dike, M.S. OTR/L  06/12/21, 1:44 PM  ascom [phone]

## 2021-06-12 NOTE — Progress Notes (Signed)
Physical Therapy Treatment Patient Details Name: [name] Goodman MRN: [id number] DOB: 1941/08/09 Today's Date: 06/12/2021   History of Present Illness Pt underwent R TKA with Dr. Rosita Kea on 06/11/2021. Pt has PMH of benign prostatic hypertrophy, hyperlipidemia, hypertension. Past surgical history of L TKA in 2016 and R knee arthoscopy in feb 2022.    PT Comments    Pt resting in bed upon PT arrival; agreeable to PT session; pt's significant other present during session.  0/10 R knee pain beginning/end of session at rest (mild increase in R knee pain with activities during session).  SBA supine to sitting edge of bed; CGA with transfers; and CGA with ambulation 120 feet with RW.  R knee AROM 10-90 degrees (pt educated on safe R knee positioning and focus on R knee extension to improve ROM--pt verbalizing understanding).  Will focus on increasing ambulation distance and trial stairs this afternoon (pt reports plan to discharge home today).   Recommendations for follow up therapy are one component of a multi-disciplinary discharge planning process, led by the attending physician.  Recommendations may be updated based on patient status, additional functional criteria and insurance authorization.  Follow Up Recommendations  Home health PT;Supervision for mobility/OOB     Equipment Recommendations  Rolling walker with 5" wheels    Recommendations for Other Services       Precautions / Restrictions Precautions Precautions: Fall;Knee Precaution Booklet Issued: Yes (comment) Precaution Comments: wound vac Restrictions Weight Bearing Restrictions: Yes RLE Weight Bearing: Weight bearing as tolerated     Mobility  Bed Mobility Overal bed mobility: Needs Assistance Bed Mobility: Supine to Sit     Supine to sit: Supervision     General bed mobility comments: bed flat; mild increased effort to perform on own; SBA for lines    Transfers Overall transfer level: Needs  assistance Equipment used: Rolling walker (2 wheeled) Transfers: Sit to/from Stand Sit to Stand: Min guard         General transfer comment: vc's for UE/LE placement; increased effort to stand from bed but steady and safe  Ambulation/Gait Ambulation/Gait assistance: Min guard Gait Distance (Feet): 120 Feet Assistive device: Rolling walker (2 wheeled)   Gait velocity: decreased   General Gait Details: antalgic; step to progressing to partial step through gait pattern; steady with RW use   Stairs             Wheelchair Mobility    Modified Rankin (Stroke Patients Only)       Balance Overall balance assessment: Needs assistance Sitting-balance support: Feet supported;No upper extremity supported Sitting balance-Leahy Scale: Normal Sitting balance - Comments: steady sitting reaching outside BOS   Standing balance support: Bilateral upper extremity supported;During functional activity Standing balance-Leahy Scale: Good Standing balance comment: no loss of balance with functional mobility during sessions activities (using RW)                            Cognition Arousal/Alertness: Awake/alert Behavior During Therapy: WFL for tasks assessed/performed Overall Cognitive Status: Within Functional Limits for tasks assessed                                 General Comments: Pt A&O x 4      Exercises Total Joint Exercises Ankle Circles/Pumps: AROM;Strengthening;Both;10 reps;Supine Quad Sets: AROM;Strengthening;Both;10 reps;Supine Short Arc Quad: AROM;Strengthening;Right;10 reps;Supine Heel Slides: AAROM;AROM;Strengthening;Right;10 reps;Supine Hip ABduction/ADduction: AAROM;AROM;Strengthening;Right;10 reps;Supine Straight Leg  Raises: AAROM;AROM;Strengthening;Right;10 reps;Supine Goniometric ROM: 10-90 degrees    General Comments General comments (skin integrity, edema, etc.): wound vac in place R LE.  Nursing cleared pt for participation in  physical therapy.  Pt agreeable to PT session.      Pertinent Vitals/Pain Pain Assessment: 0-10 Faces Pain Scale: No hurt Pain Location: R knee Pain Descriptors / Indicators: Sore Pain Intervention(s): Limited activity within patient's tolerance;Monitored during session;Repositioned;Premedicated before session;Other (comment) (polar care applied and activated) O2 sats on room air stable and WFL throughout treatment session.  HR 61-76 bpm during sessions activities.    Home Living                      Prior Function            PT Goals (current goals can now be found in the care plan section) Acute Rehab PT Goals Patient Stated Goal: to get back to walking PT Goal Formulation: With patient Time For Goal Achievement: 06/25/21 Potential to Achieve Goals: Good Progress towards PT goals: Progressing toward goals    Frequency    BID      PT Plan Current plan remains appropriate    Co-evaluation              AM-PAC PT "6 Clicks" Mobility   Outcome Measure  Help needed turning from your back to your side while in a flat bed without using bedrails?: None Help needed moving from lying on your back to sitting on the side of a flat bed without using bedrails?: None Help needed moving to and from a bed to a chair (including a wheelchair)?: A Little Help needed standing up from a chair using your arms (e.g., wheelchair or bedside chair)?: A Little Help needed to walk in hospital room?: A Little Help needed climbing 3-5 steps with a railing? : A Little 6 Click Score: 20    End of Session Equipment Utilized During Treatment: Gait belt Activity Tolerance: Patient tolerated treatment well Patient left: in chair;with call bell/phone within reach;with chair alarm set;with nursing/sitter in room;with family/visitor present;with SCD's reapplied;Other (comment) (B heels floating via towel rolls; polar care in place and activated) Nurse Communication: Mobility status;Weight  bearing status;Precautions PT Visit Diagnosis: Unsteadiness on feet (R26.81);Muscle weakness (generalized) (M62.81);Pain Pain - Right/Left: Right Pain - part of body: Knee     Time: 7416-3845 PT Time Calculation (min) (ACUTE ONLY): 40 min  Charges:  $Gait Training: 8-22 mins $Therapeutic Exercise: 8-22 mins $Therapeutic Activity: 8-22 mins                    Hendricks Limes, PT 06/12/21, 10:13 AM

## 2021-06-12 NOTE — Discharge Summary (Signed)
Physician Discharge Summary  Patient ID: MACON SANDIFORD MRN: [id number] DOB/AGE: May 10, 1941 80 y.o.  Admit date: 06/11/2021 Discharge date: 06/12/2021  Admission Diagnoses:  S/P TKR (total knee replacement) using cement, right [Z96.651]   Discharge Diagnoses: Patient Active Problem List   Diagnosis Date Noted   S/P TKR (total knee replacement) using cement, right 06/11/2021   Nephrolithiasis 11/24/2017   BPH without obstruction/lower urinary tract symptoms 11/24/2017   Sinus bradycardia 02/22/2017   OA (osteoarthritis) of knee 07/23/2015    Past Medical History:  Diagnosis Date   Arthritis    History of kidney stones    Hypertension    Nocturia      Transfusion: none   Consultants (if any):   Discharged Condition: Improved  Hospital Course: HOOVER GREWE is an 80 y.o. male who was admitted 06/11/2021 with a diagnosis of right knee osteoarthritis and went to the operating room on 06/11/2021 and underwent the above named procedures.    Surgeries: Procedure(s): TOTAL KNEE ARTHROPLASTY on 06/11/2021 Patient tolerated the surgery well. Taken to PACU where she was stabilized and then transferred to the orthopedic floor.  Started on Lovenox 30 mg q 12 hrs. Foot pumps applied bilaterally at 80 mm. Heels elevated on bed with rolled towels. No evidence of DVT. Negative Homan. Physical therapy started on day #1 for gait training and transfer. OT started day #1 for ADL and assisted devices.  Patient's foley was d/c on day #1. Patient's IV and hemovac was d/c on day #1.  On post op day #1patient was stable and ready for discharge to home with HHPT.  Implants: Medacta  GMK sphere system with 6 femur, 6 right tibia with 3 mm offset and short stem and 10 mm insert.  Size 3 patella, all components cemented  He was given perioperative antibiotics:  Anti-infectives (From admission, onward)    Start     Dose/Rate Route Frequency Ordered Stop   06/11/21 1400  ceFAZolin  (ANCEF) IVPB 2g/100 mL premix        2 g 200 mL/hr over 30 Minutes Intravenous Every 6 hours 06/11/21 1005 06/11/21 2120   06/11/21 1400  ceFAZolin (ANCEF) 2-4 GM/100ML-% IVPB       Note to Pharmacy: Francene Finders   : cabinet override      06/11/21 1400 06/12/21 0214   06/11/21 0616  ceFAZolin (ANCEF) 2-4 GM/100ML-% IVPB       Note to Pharmacy: Desma Paganini   : cabinet override      06/11/21 0616 06/11/21 1431   06/11/21 0600  ceFAZolin (ANCEF) IVPB 2g/100 mL premix        2 g 200 mL/hr over 30 Minutes Intravenous On call to O.R. 06/11/21 1093 06/11/21 0810     .  He was given sequential compression devices, early ambulation, and Lovenox for DVT prophylaxis.  He benefited maximally from the hospital stay and there were no complications.    Recent vital signs:  Vitals:   06/12/21 0424 06/12/21 0915  BP: 138/74 (!) 155/72  Pulse: 66 71  Resp: 18 17  Temp: 98.4 F (36.9 C)   SpO2: 97% 93%    Recent laboratory studies:  Lab Results  Component Value Date   HGB 13.1 06/12/2021   HGB 14.4 06/11/2021   HGB 17.0 05/31/2021   Lab Results  Component Value Date   WBC 12.3 (H) 06/12/2021   PLT 153 06/12/2021   Lab Results  Component Value Date   INR 1.06 07/17/2015  Lab Results  Component Value Date   NA 137 06/12/2021   K 4.0 06/12/2021   CL 104 06/12/2021   CO2 26 06/12/2021   BUN 19 06/12/2021   CREATININE 0.97 06/12/2021   GLUCOSE 187 (H) 06/12/2021    Discharge Medications:   Allergies as of 06/12/2021   No Known Allergies      Medication List     STOP taking these medications    naproxen sodium 220 MG tablet Commonly known as: ALEVE       TAKE these medications    aspirin EC 81 MG tablet Take 81 mg by mouth daily.   atorvastatin 10 MG tablet Commonly known as: LIPITOR Take 10 mg by mouth daily.   diltiazem 240 MG 24 hr capsule Commonly known as: TIAZAC Take 240 mg by mouth daily.   docusate sodium 100 MG capsule Commonly known as:  COLACE Take 1 capsule (100 mg total) by mouth 2 (two) times daily.   enoxaparin 40 MG/0.4ML injection Commonly known as: LOVENOX Inject 0.4 mLs (40 mg total) into the skin daily for 14 days.   fosinopril 20 MG tablet Commonly known as: MONOPRIL Take 20 mg by mouth daily.   hydrochlorothiazide 25 MG tablet Commonly known as: HYDRODIURIL Take 25 mg by mouth daily.   HYDROcodone-acetaminophen 5-325 MG tablet Commonly known as: NORCO/VICODIN Take 1-2 tablets by mouth every 4 (four) hours as needed for moderate pain (pain score 4-6).   polyethylene glycol 17 g packet Commonly known as: MIRALAX / GLYCOLAX Take 17 g by mouth daily as needed for mild constipation.   traMADol 50 MG tablet Commonly known as: ULTRAM Take 1 tablet (50 mg total) by mouth every 6 (six) hours as needed.               Durable Medical Equipment  (From admission, onward)           Start     Ordered   06/11/21 1149  DME Walker rolling  Once       Question Answer Comment  Walker: With 5 Inch Wheels   Patient needs a walker to treat with the following condition S/P TKR (total knee replacement) using cement, right      06/11/21 1148   06/11/21 1149  DME 3 n 1  Once        06/11/21 1148   06/11/21 1149  DME Bedside commode  Once       Question:  Patient needs a bedside commode to treat with the following condition  Answer:  S/P TKR (total knee replacement) using cement, right   06/11/21 1148            Diagnostic Studies: DG Knee 1-2 Views Right  Result Date: 06/11/2021 CLINICAL DATA:  Status post right knee arthroplasty. EXAM: RIGHT KNEE - 1-2 VIEW COMPARISON:  04/30/2021 FINDINGS: Postoperative change consistent with right total knee arthroplasty. There is gas identified within the ventral subcutaneous soft tissues as well as the joint space. Hardware components are in anatomic alignment. No signs of periprosthetic fracture or subluxation. Ventral midline skin staples identified. IMPRESSION:  Status post right total knee arthroplasty. Electronically Signed   By: Signa Kell M.D.   On: 06/11/2021 11:04    Disposition:      Follow-up Information     Leelynn, [place]tsel, PA-C Follow up in 2 week(s).   Specialties: Orthopedic Surgery, Emergency Medicine Contact information: [address] [place] [address] [phone]  Signed: Amador Cunas CHRISTOPHER 06/12/2021, 9:22 AM

## 2021-06-12 NOTE — Discharge Instructions (Signed)

## 2021-06-12 NOTE — Progress Notes (Signed)
Physical Therapy Treatment Patient Details Name: [name] Goodman MRN: [id number] DOB: 1941/03/31 Today's Date: 06/12/2021   History of Present Illness Pt underwent R TKA with Dr. Rosita Kea on 06/11/2021. Pt has PMH of benign prostatic hypertrophy, hyperlipidemia, hypertension. Past surgical history of L TKA in 2016 and R knee arthoscopy in feb 2022.    PT Comments    Pt resting in recliner upon PT arrival; agreeable to PT session; pt's significant other present during session.  SBA with transfers and ambulation 240 feet with RW; CGA navigating 8 steps with railing.  Pt steady and safe with functional mobility during sessions activities.  No R knee pain at rest beginning/end of session and minimal R knee pain during sessions activities.  Pt and pt's significant other educated on home safety, fall prevention, and safe car transfers: pt and pt's significant other verbalizing understanding.  Pt's walker for home adjusted to pt's height.  Pt appears safe to discharge home when medically appropriate: pt's nurse and PA-C notified.   Recommendations for follow up therapy are one component of a multi-disciplinary discharge planning process, led by the attending physician.  Recommendations may be updated based on patient status, additional functional criteria and insurance authorization.  Follow Up Recommendations  Home health PT;Supervision for mobility/OOB     Equipment Recommendations  Rolling walker with 5" wheels    Recommendations for Other Services       Precautions / Restrictions Precautions Precautions: Fall;Knee Precaution Booklet Issued: Yes (comment) Precaution Comments: wound vac Restrictions Weight Bearing Restrictions: Yes RLE Weight Bearing: Weight bearing as tolerated     Mobility  Bed Mobility               General bed mobility comments: Deferred    Transfers Overall transfer level: Needs assistance Equipment used: Rolling walker (2 wheeled) Transfers: Sit  to/from Stand Sit to Stand: Supervision         General transfer comment: x2 trials from recliner; vc's for slower descent sitting (pt's significant other able to give appropriate cueing)  Ambulation/Gait Ambulation/Gait assistance: Supervision Gait Distance (Feet): 240 Feet Assistive device: Rolling walker (2 wheeled) Gait Pattern/deviations: Antalgic Gait velocity: mildly decreased   General Gait Details: partial step through progressing to step through gait pattern; steady with RW use   Stairs Stairs: Yes Stairs assistance: Min guard Stair Management: One rail Left;Step to pattern;Forwards Number of Stairs: 8 General stair comments: initial vc's for LE sequencing; steady   Wheelchair Mobility    Modified Rankin (Stroke Patients Only)       Balance Overall balance assessment: Needs assistance Sitting-balance support: No upper extremity supported;Feet supported Sitting balance-Leahy Scale: Normal Sitting balance - Comments: steady sitting reaching outside BOS   Standing balance support: No upper extremity supported Standing balance-Leahy Scale: Good Standing balance comment: steady standing reaching within BOS                            Cognition Arousal/Alertness: Awake/alert Behavior During Therapy: WFL for tasks assessed/performed Overall Cognitive Status: Within Functional Limits for tasks assessed                                 General Comments: Pt A&O x 4      Exercises Total Joint Exercises Long Arc Quad: AAROM;Strengthening;Right;10 reps;Seated Knee Flexion: AROM;Strengthening;Right;10 reps;Seated General Exercises - Lower Extremity Hip Flexion/Marching: AROM;Strengthening;Both;10 reps;Seated    General Comments  General comments (skin integrity, edema, etc.): wound vac in place R LE.      Pertinent Vitals/Pain Pain Assessment: No/denies pain Faces Pain Scale: Hurts a little bit Pain Location: R knee Pain  Descriptors / Indicators: Sore Pain Intervention(s): Limited activity within patient's tolerance;Monitored during session;Premedicated before session;Repositioned;Other (comment) (polar care applied) Vitals (HR and O2 on room air) stable and WFL throughout treatment session.    Home Living    Prior Function     PT Goals (current goals can now be found in the care plan section) Acute Rehab PT Goals Patient Stated Goal: to get back to walking PT Goal Formulation: With patient Time For Goal Achievement: 06/25/21 Potential to Achieve Goals: Good Progress towards PT goals: Progressing toward goals    Frequency    BID      PT Plan Current plan remains appropriate    Co-evaluation              AM-PAC PT "6 Clicks" Mobility   Outcome Measure  Help needed turning from your back to your side while in a flat bed without using bedrails?: None Help needed moving from lying on your back to sitting on the side of a flat bed without using bedrails?: None Help needed moving to and from a bed to a chair (including a wheelchair)?: A Little Help needed standing up from a chair using your arms (e.g., wheelchair or bedside chair)?: A Little Help needed to walk in hospital room?: A Little Help needed climbing 3-5 steps with a railing? : A Little 6 Click Score: 20    End of Session Equipment Utilized During Treatment: Gait belt Activity Tolerance: Patient tolerated treatment well Patient left: in chair;with call bell/phone within reach;with chair alarm set;with family/visitor present;with SCD's reapplied;Other (comment) (R heel floating via towel roll) Nurse Communication: Mobility status;Weight bearing status;Precautions PT Visit Diagnosis: Unsteadiness on feet (R26.81);Muscle weakness (generalized) (M62.81);Pain Pain - Right/Left: Right Pain - part of body: Knee     Time: 9563-8756 PT Time Calculation (min) (ACUTE ONLY): 38 min  Charges:  $Gait Training: 8-22 mins $Therapeutic  Exercise: 8-22 mins $Therapeutic Activity: 8-22 mins                     Hendricks Limes, PT 06/12/21, 2:43 PM

## 2021-06-14 DIAGNOSIS — Z471 Aftercare following joint replacement surgery: Secondary | ICD-10-CM | POA: Diagnosis not present

## 2021-06-14 DIAGNOSIS — R351 Nocturia: Secondary | ICD-10-CM | POA: Diagnosis not present

## 2021-06-14 DIAGNOSIS — I10 Essential (primary) hypertension: Secondary | ICD-10-CM | POA: Diagnosis not present

## 2021-06-14 DIAGNOSIS — R300 Dysuria: Secondary | ICD-10-CM | POA: Diagnosis not present

## 2021-06-14 DIAGNOSIS — Z7982 Long term (current) use of aspirin: Secondary | ICD-10-CM | POA: Diagnosis not present

## 2021-06-14 DIAGNOSIS — Z96653 Presence of artificial knee joint, bilateral: Secondary | ICD-10-CM | POA: Diagnosis not present

## 2021-06-14 DIAGNOSIS — E785 Hyperlipidemia, unspecified: Secondary | ICD-10-CM | POA: Diagnosis not present

## 2021-06-14 DIAGNOSIS — N401 Enlarged prostate with lower urinary tract symptoms: Secondary | ICD-10-CM | POA: Diagnosis not present

## 2021-06-14 DIAGNOSIS — Z86010 Personal history of colonic polyps: Secondary | ICD-10-CM | POA: Diagnosis not present

## 2021-06-14 DIAGNOSIS — N39498 Other specified urinary incontinence: Secondary | ICD-10-CM | POA: Diagnosis not present

## 2021-06-23 DIAGNOSIS — E785 Hyperlipidemia, unspecified: Secondary | ICD-10-CM | POA: Diagnosis not present

## 2021-06-23 DIAGNOSIS — N39498 Other specified urinary incontinence: Secondary | ICD-10-CM | POA: Diagnosis not present

## 2021-06-23 DIAGNOSIS — Z96653 Presence of artificial knee joint, bilateral: Secondary | ICD-10-CM | POA: Diagnosis not present

## 2021-06-23 DIAGNOSIS — Z86010 Personal history of colonic polyps: Secondary | ICD-10-CM | POA: Diagnosis not present

## 2021-06-23 DIAGNOSIS — Z471 Aftercare following joint replacement surgery: Secondary | ICD-10-CM | POA: Diagnosis not present

## 2021-06-23 DIAGNOSIS — Z7982 Long term (current) use of aspirin: Secondary | ICD-10-CM | POA: Diagnosis not present

## 2021-06-23 DIAGNOSIS — R351 Nocturia: Secondary | ICD-10-CM | POA: Diagnosis not present

## 2021-06-23 DIAGNOSIS — N401 Enlarged prostate with lower urinary tract symptoms: Secondary | ICD-10-CM | POA: Diagnosis not present

## 2021-06-23 DIAGNOSIS — I10 Essential (primary) hypertension: Secondary | ICD-10-CM | POA: Diagnosis not present

## 2021-06-24 DIAGNOSIS — R300 Dysuria: Secondary | ICD-10-CM | POA: Diagnosis not present

## 2021-06-27 DIAGNOSIS — M25561 Pain in right knee: Secondary | ICD-10-CM | POA: Diagnosis not present

## 2021-07-01 DIAGNOSIS — M25561 Pain in right knee: Secondary | ICD-10-CM | POA: Diagnosis not present

## 2021-07-03 DIAGNOSIS — M25561 Pain in right knee: Secondary | ICD-10-CM | POA: Diagnosis not present

## 2021-07-08 DIAGNOSIS — M25561 Pain in right knee: Secondary | ICD-10-CM | POA: Diagnosis not present

## 2021-07-11 DIAGNOSIS — M25561 Pain in right knee: Secondary | ICD-10-CM | POA: Diagnosis not present

## 2021-07-15 DIAGNOSIS — M25561 Pain in right knee: Secondary | ICD-10-CM | POA: Diagnosis not present

## 2021-07-19 DIAGNOSIS — M25561 Pain in right knee: Secondary | ICD-10-CM | POA: Diagnosis not present

## 2021-07-22 DIAGNOSIS — M25561 Pain in right knee: Secondary | ICD-10-CM | POA: Diagnosis not present

## 2021-07-24 DIAGNOSIS — M1711 Unilateral primary osteoarthritis, right knee: Secondary | ICD-10-CM | POA: Diagnosis not present

## 2021-07-24 DIAGNOSIS — M25561 Pain in right knee: Secondary | ICD-10-CM | POA: Diagnosis not present

## 2021-07-25 IMAGING — MR MR KNEE*R* W/O CM
7 series · 40 of 40 positions shown · non-contrast
Comparison: None.

CLINICAL DATA: Right knee pain.

EXAM:
MRI OF THE RIGHT KNEE WITHOUT CONTRAST
TECHNIQUE: Multiplanar, multisequence MR imaging of the knee was performed. No
intravenous contrast was administered.

[Series 24: T2 fat-sat · axial · right · 4.0mm · 0.50mm/px · z∈[-91,+63]mm · 7 of 32 slices shown (1 of 3)]
[im 1/32]
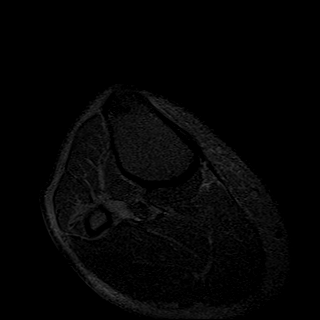
[im 6/32]
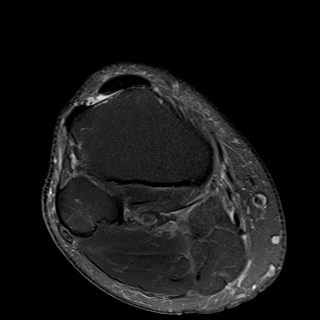
[im 11/32]
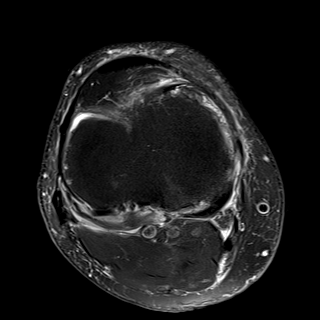
[im 16/32]
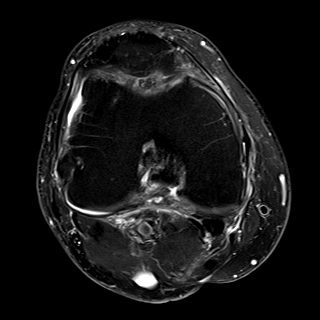
[im 21/32]
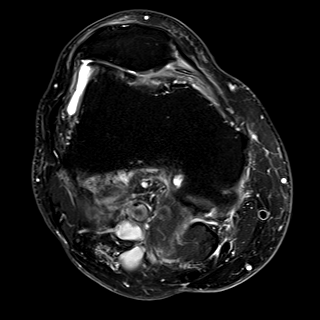
[im 26/32]
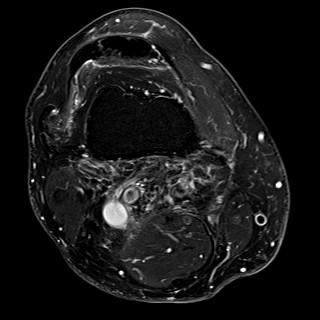
[im 32/32]
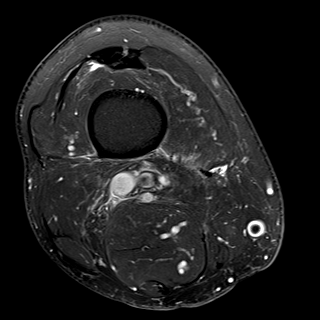

[Series 25: T2 fat-sat · coronal · right · 4.0mm · 0.59mm/px · 6 of 31 slices shown (2 of 3)]
[im 1/31]
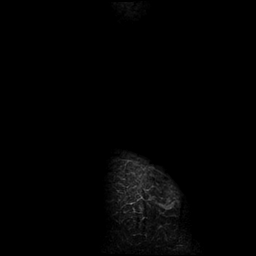
[im 7/31]
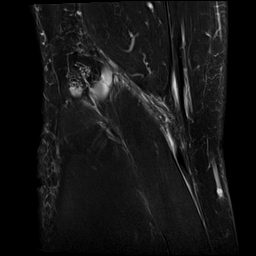
[im 13/31]
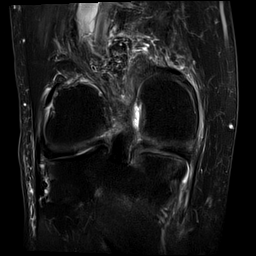
[im 19/31]
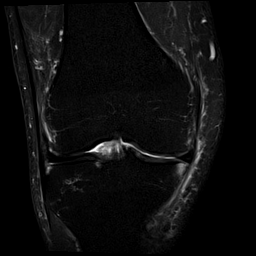
[im 25/31]
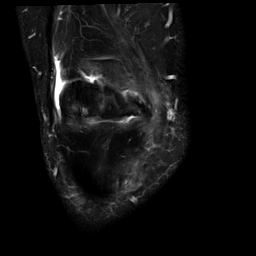
[im 31/31]
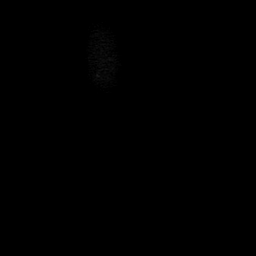

[Series 26: T1 · coronal · right · 4.0mm · 0.59mm/px · 6 of 32 slices shown]
[im 1/32]
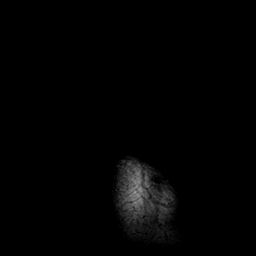
[im 7/32]
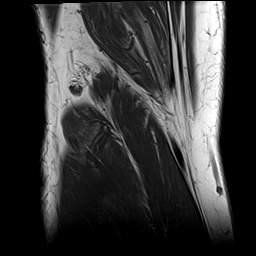
[im 13/32]
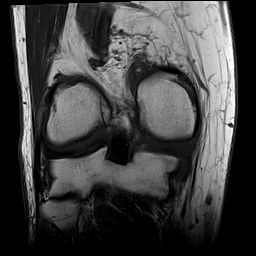
[im 19/32]
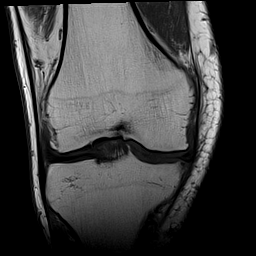
[im 25/32]
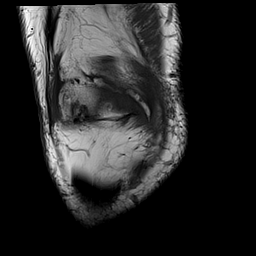
[im 32/32]
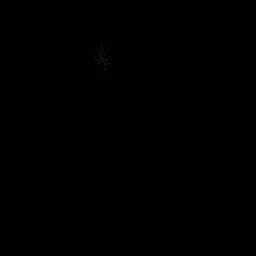

[Series 27: PD fat-sat · coronal · right · 4.0mm · 0.59mm/px · 6 of 32 slices shown (1 of 2)]
[im 1/32]
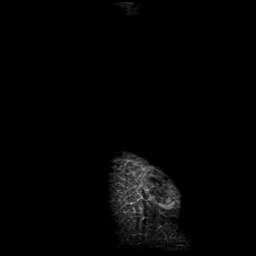
[im 7/32]
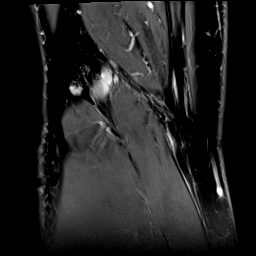
[im 13/32]
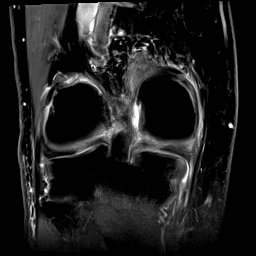
[im 19/32]
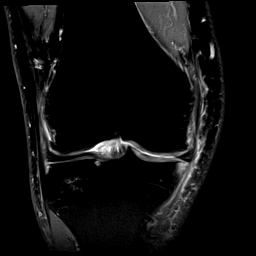
[im 25/32]
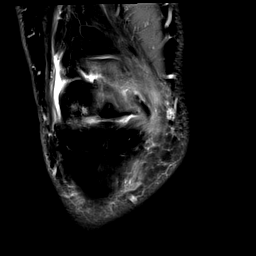
[im 32/32]
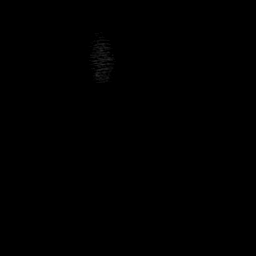

[Series 28: PD fat-sat · sagittal · right · 3.0mm · 0.62mm/px · 6 of 34 slices shown (2 of 2)]
[im 1/34]
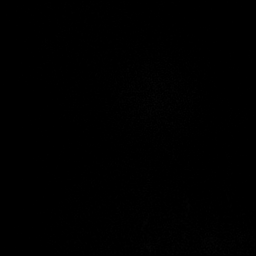
[im 7/34]
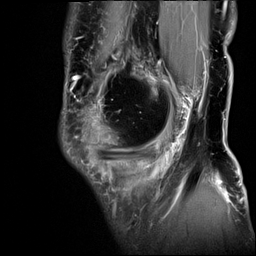
[im 14/34]
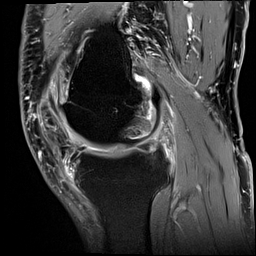
[im 20/34]
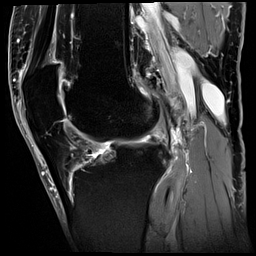
[im 27/34]
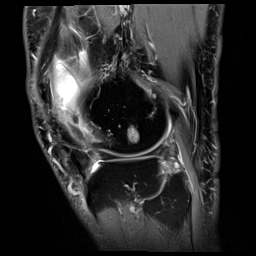
[im 34/34]
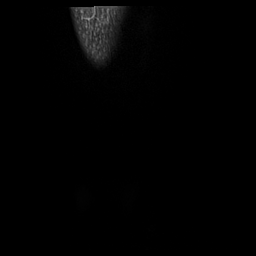

[Series 29: T2 fat-sat · sagittal · right · 3.0mm · 0.62mm/px · 6 of 34 slices shown (3 of 3)]
[im 1/34]
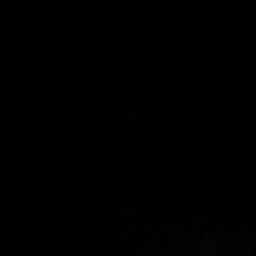
[im 7/34]
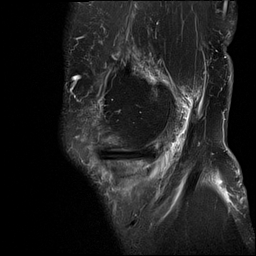
[im 14/34]
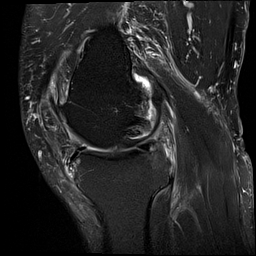
[im 20/34]
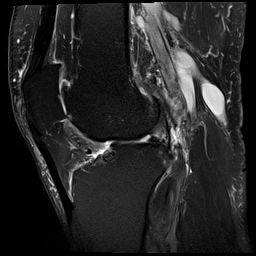
[im 27/34]
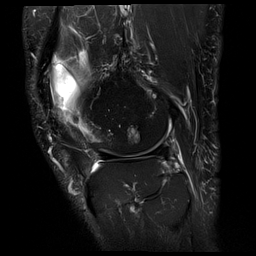
[im 34/34]
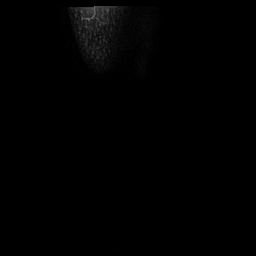

[Series 30: PD · oblique · right · 2.0mm · 0.47mm/px · 3 of 18 slices shown]
[im 1/18]
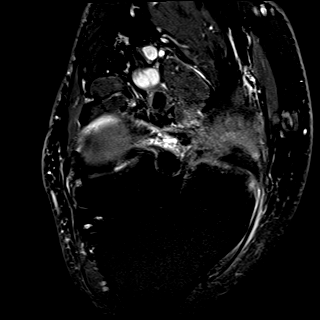
[im 9/18]
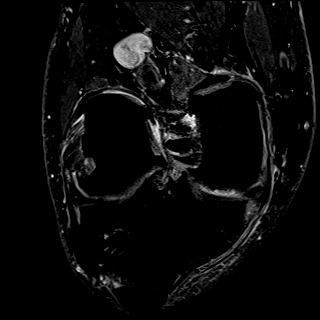
[im 18/18]
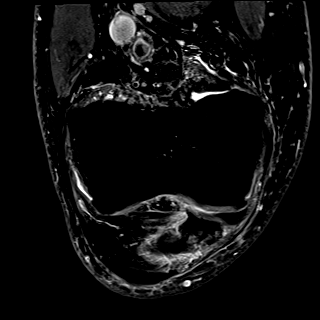

[40 of 40 positions shown; findings below may reference images not displayed]

FINDINGS: MENISCI

Medial meniscus: Large radial tear involving the posterior horn with
associated medial protrusion of the meniscus estimated at 3.5 mm.
There is also an oblique coursing inferior articular surface tear in
the midbody region.

Lateral meniscus: Shallow oblique coursing inferior articular
surface tear involving the posterior horn mid body junction region.

LIGAMENTS

Cruciates:  Intact

Collaterals:  Intact.  Moderate MCL and pes anserine bursitis.

CARTILAGE

Patellofemoral: Advanced degenerative chondrosis with areas of
full-thickness cartilage loss, joint space narrowing, osteophytic
spurring and subchondral cystic change.

Medial: Moderate degenerative chondrosis with early spurring
changes.

Lateral: Moderate to advanced degenerative chondrosis with joint
space narrowing and spurring.

Joint:  Joint small joint effusion.

Popliteal Fossa:  Small Baker's cyst.

Extensor Mechanism: The patella retinacular structures are intact
and the quadriceps and patellar tendons are intact.

Bones:  No acute bony findings.

Other: Unremarkable knee musculature.
IMPRESSION: 1. Medial and lateral meniscal tears as described above.
2. Intact ligamentous structures and no acute bony findings.
3. Tricompartmental degenerative changes most significant in the
patellofemoral joint.
4. Moderate MCL and pes anserine bursitis.
5. Small joint effusion and small Baker's cyst.

## 2021-11-22 ENCOUNTER — Other Ambulatory Visit: Payer: Self-pay | Admitting: *Deleted

## 2021-11-22 DIAGNOSIS — N200 Calculus of kidney: Secondary | ICD-10-CM

## 2021-11-25 ENCOUNTER — Other Ambulatory Visit: Payer: Self-pay

## 2021-11-25 ENCOUNTER — Other Ambulatory Visit: Payer: Self-pay | Admitting: Family Medicine

## 2021-11-25 ENCOUNTER — Encounter: Payer: Self-pay | Admitting: Urology

## 2021-11-25 ENCOUNTER — Ambulatory Visit: Payer: PPO | Admitting: Urology

## 2021-11-25 ENCOUNTER — Ambulatory Visit
Admission: RE | Admit: 2021-11-25 | Discharge: 2021-11-25 | Disposition: A | Discharge status: Home / Self Care | Payer: PPO | Source: Ambulatory Visit | Attending: Urology | Admitting: Urology

## 2021-11-25 VITALS — BP 130/72 | HR 80 | Ht 73.0 in | Wt 213.0 lb

## 2021-11-25 DIAGNOSIS — N200 Calculus of kidney: Secondary | ICD-10-CM

## 2021-11-25 DIAGNOSIS — N401 Enlarged prostate with lower urinary tract symptoms: Secondary | ICD-10-CM

## 2021-11-25 DIAGNOSIS — I878 Other specified disorders of veins: Secondary | ICD-10-CM | POA: Diagnosis not present

## 2021-11-25 NOTE — Progress Notes (Signed)
? ?11/25/2021 ?8:36 AM  ? ?[name] Goodman ?1940/12/26 ?[id number] ? ?Referring provider: [name], MD ?[address] [place] ?[facility] ?[place],  [place] [address] ? ?Chief Complaint  ?Patient presents with  ? Nephrolithiasis  ? ? ?  ?Urologic history: ?1.  BPH with urinary retention ?            -Status post TURP 02/2014 ?  ?2.  Left nephrolithiasis ?            -Nonobstructing lower pole renal calculi ?            -Prior ureteroscopy/lithotripsy ? ?HPI: ?81 y.o. male presents for annual follow-up. ? ?No bothersome LUTS ?Occasional urgency when he holds urine ?Denies flank, abdominal or pelvic pain ? ?PMH: ?Past Medical History:  ?Diagnosis Date  ? Arthritis   ? History of kidney stones   ? Hypertension   ? Nocturia   ? ? ?Surgical History: ?Past Surgical History:  ?Procedure Laterality Date  ? APPENDECTOMY    ? CATARACT EXTRACTION W/PHACO Right 11/27/2020  ? Procedure: CATARACT EXTRACTION PHACO AND INTRAOCULAR LENS PLACEMENT (IOC) RIGHT VIVITY LENS toric 6.31 00.41.8;  Surgeon: Galen Manila, MD;  Location: [facility];  Service: Ophthalmology;  Laterality: Right;  ? KNEE ARTHROSCOPY Left   ? KNEE ARTHROSCOPY WITH LATERAL MENISECTOMY Right 11/13/2020  ? Procedure: Right knee arthroscopy, partial medial and lateral meniscectomy;  Surgeon: Kennedy Bucker, MD;  Location: [facility];  Service: Orthopedics;  Laterality: Right;  ? POLYPECTOMY    ? WITH COLONOSCOPY  ? TOTAL KNEE ARTHROPLASTY Left 07/23/2015  ? Procedure: LEFT TOTAL KNEE ARTHROPLASTY;  Surgeon: Ollen Gross, MD;  Location: [facility];  Service: Orthopedics;  Laterality: Left;  ? TOTAL KNEE ARTHROPLASTY Right 06/11/2021  ? Procedure: TOTAL KNEE ARTHROPLASTY;  Surgeon: Kennedy Bucker, MD;  Location: [facility];  Service: Orthopedics;  Laterality: Right;  ? TRANSURETHRAL RESECTION OF PROSTATE  ?2015  ? URETEROSCOPY WITH HOLMIUM LASER LITHOTRIPSY    ? ? ?Home Medications:  ?Allergies as of 11/25/2021   ?No Known Allergies ?  ? ?  ?Medication List  ?   ? ?  ? Accurate as of November 25, 2021  8:36 AM. If you have any questions, ask your nurse or doctor.  ?  ?  ? ?  ? ?STOP taking these medications   ? ?enoxaparin 40 MG/0.4ML injection ?Commonly known as: LOVENOX ?Stopped by: Riki Altes, MD ?  ? ?  ? ?TAKE these medications   ? ?aspirin EC 81 MG tablet ?Take 81 mg by mouth daily. ?  ?atorvastatin 10 MG tablet ?Commonly known as: LIPITOR ?Take 10 mg by mouth daily. ?  ?diltiazem 240 MG 24 hr capsule ?Commonly known as: TIAZAC ?Take 240 mg by mouth daily. ?  ?docusate sodium 100 MG capsule ?Commonly known as: COLACE ?Take 1 capsule (100 mg total) by mouth 2 (two) times daily. ?  ?fosinopril 20 MG tablet ?Commonly known as: MONOPRIL ?Take 20 mg by mouth daily. ?  ?hydrochlorothiazide 25 MG tablet ?Commonly known as: HYDRODIURIL ?Take 25 mg by mouth daily. ?  ?HYDROcodone-acetaminophen 5-325 MG tablet ?Commonly known as: NORCO/VICODIN ?Take 1-2 tablets by mouth every 4 (four) hours as needed for moderate pain (pain score 4-6). ?  ?polyethylene glycol 17 g packet ?Commonly known as: MIRALAX / GLYCOLAX ?Take 17 g by mouth daily as needed for mild constipation. ?  ?traMADol 50 MG tablet ?Commonly known as: ULTRAM ?Take 1 tablet (50 mg total) by mouth every 6 (six)  hours as needed. ?  ? ?  ? ? ?Allergies: No Known Allergies ? ?Family History: ?Family History  ?Problem Relation Age of Onset  ? Hypertension Father   ? Cancer Father   ? ? ?Social History:  reports that he quit smoking about 46 years ago. His smoking use included cigarettes. He has never used smokeless tobacco. He reports that he does not drink alcohol and does not use drugs. ? ? ?Physical Exam: ?BP 130/72   Pulse 80   Ht 6\' 1"  (1.854 m)   Wt 213 lb (96.6 kg)   BMI 28.10 kg/m?   ?Constitutional:  Alert and oriented, No acute distress. ?HEENT: [place] AT, moist mucus membranes.  Trachea midline, no masses. ?Cardiovascular: No clubbing, cyanosis, or edema. ?Respiratory: Normal respiratory effort, no increased  work of breathing. ? ? ?Pertinent Imaging: ?Images of a KUB performed this morning were personally viewed and interpreted  ? ? ?Assessment & Plan:   ? ?1.  BPH with LUTS ?Mild voiding symptoms which are stable ? ?2.  Left nephrolithiasis ?Stable left lower pole calculi ?1 year follow-up with KUB ? ? ? , MD ? ?[facility] ?[address], [address] ?[place], [place] ?[phone] ? ?

## 2021-11-29 ENCOUNTER — Telehealth: Payer: Self-pay | Admitting: *Deleted

## 2021-11-29 NOTE — Telephone Encounter (Signed)
Notified patient as instructed, patient pleased °

## 2021-11-29 NOTE — Telephone Encounter (Signed)
-----   Message from Riki Altes, MD sent at 11/29/2021  7:48 AM EST ----- ?KUB with stable left lower pole calculi ?

## 2022-01-23 DIAGNOSIS — R9431 Abnormal electrocardiogram [ECG] [EKG]: Secondary | ICD-10-CM | POA: Diagnosis not present

## 2022-01-23 DIAGNOSIS — R001 Bradycardia, unspecified: Secondary | ICD-10-CM | POA: Diagnosis not present

## 2022-01-23 DIAGNOSIS — I10 Essential (primary) hypertension: Secondary | ICD-10-CM | POA: Diagnosis not present

## 2022-01-23 DIAGNOSIS — R6889 Other general symptoms and signs: Secondary | ICD-10-CM | POA: Diagnosis not present

## 2022-01-23 DIAGNOSIS — E785 Hyperlipidemia, unspecified: Secondary | ICD-10-CM | POA: Diagnosis not present

## 2022-02-18 DIAGNOSIS — Z125 Encounter for screening for malignant neoplasm of prostate: Secondary | ICD-10-CM | POA: Diagnosis not present

## 2022-02-18 DIAGNOSIS — I10 Essential (primary) hypertension: Secondary | ICD-10-CM | POA: Diagnosis not present

## 2022-02-18 DIAGNOSIS — E785 Hyperlipidemia, unspecified: Secondary | ICD-10-CM | POA: Diagnosis not present

## 2022-02-18 DIAGNOSIS — E538 Deficiency of other specified B group vitamins: Secondary | ICD-10-CM | POA: Diagnosis not present

## 2022-02-18 DIAGNOSIS — R7989 Other specified abnormal findings of blood chemistry: Secondary | ICD-10-CM | POA: Diagnosis not present

## 2022-02-18 DIAGNOSIS — G609 Hereditary and idiopathic neuropathy, unspecified: Secondary | ICD-10-CM | POA: Diagnosis not present

## 2022-02-18 DIAGNOSIS — L989 Disorder of the skin and subcutaneous tissue, unspecified: Secondary | ICD-10-CM | POA: Diagnosis not present

## 2022-02-18 DIAGNOSIS — Z0000 Encounter for general adult medical examination without abnormal findings: Secondary | ICD-10-CM | POA: Diagnosis not present

## 2022-03-01 IMAGING — DX DG KNEE 1-2V*R*
2 series · 2 of 2 positions shown · non-contrast
Comparison: 04/30/2021

CLINICAL DATA: Status post right knee arthroplasty.

EXAM:
RIGHT KNEE - 1-2 VIEW

[knee ap]
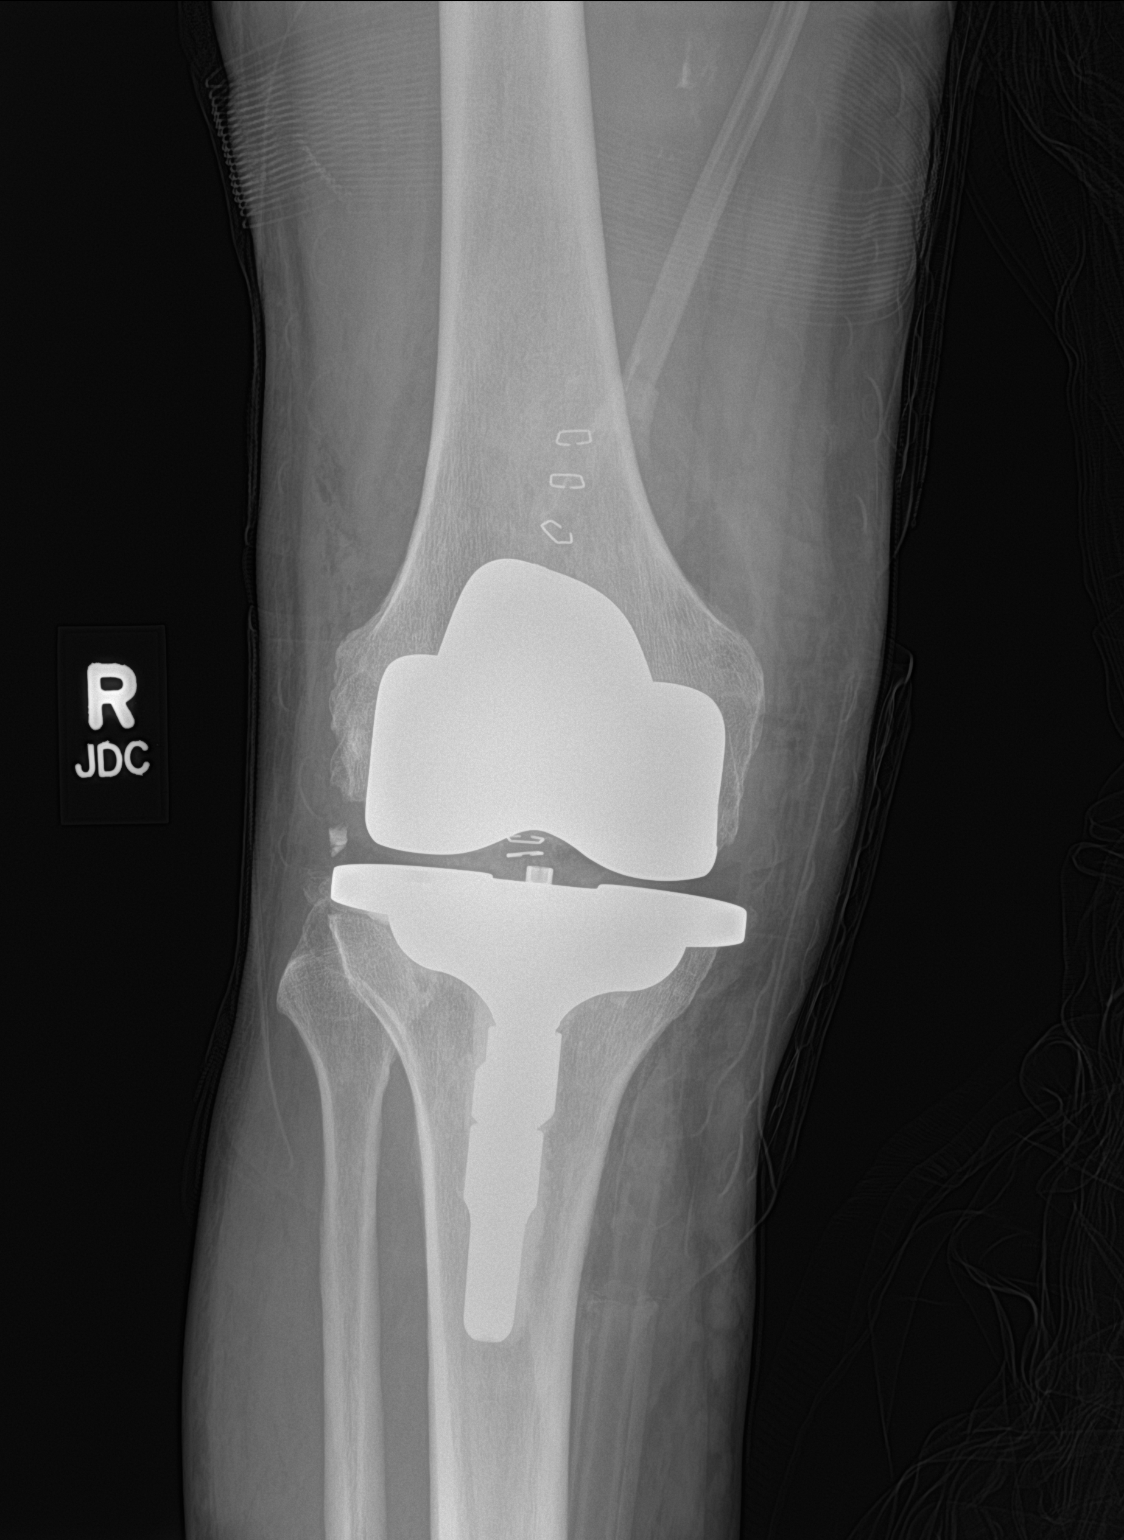

[knee lat]
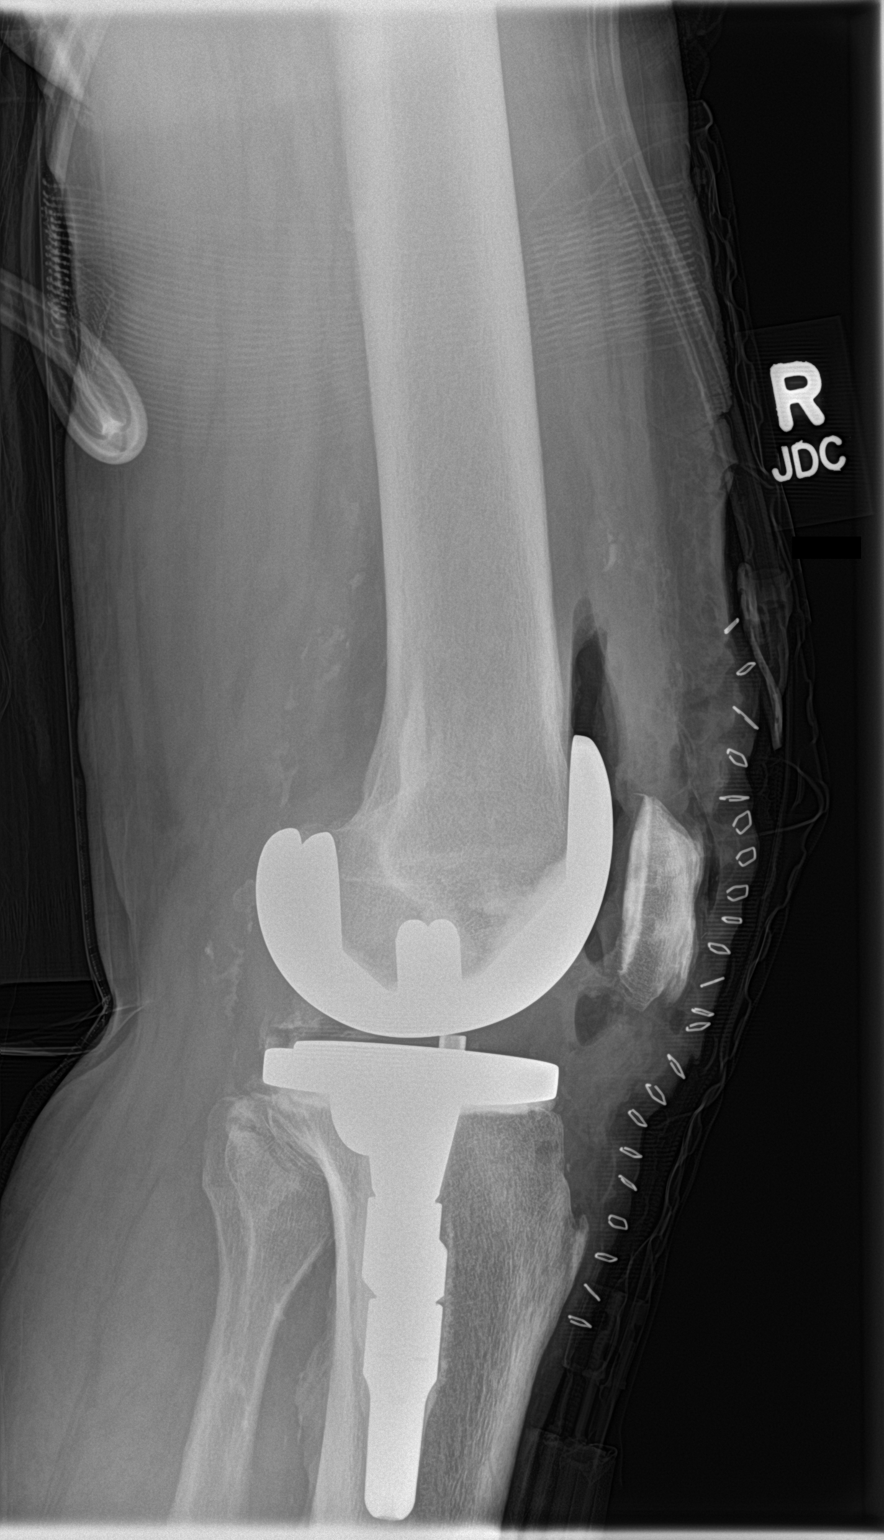

[2 of 2 positions shown; findings below may reference images not displayed]

FINDINGS: Postoperative change consistent with right total knee arthroplasty.
There is gas identified within the ventral subcutaneous soft tissues
as well as the joint space. Hardware components are in anatomic
alignment. No signs of periprosthetic fracture or subluxation.
Ventral midline skin staples identified.
IMPRESSION: Status post right total knee arthroplasty.

## 2022-03-17 DIAGNOSIS — R7989 Other specified abnormal findings of blood chemistry: Secondary | ICD-10-CM | POA: Diagnosis not present

## 2022-04-03 DIAGNOSIS — R6889 Other general symptoms and signs: Secondary | ICD-10-CM | POA: Diagnosis not present

## 2022-04-03 DIAGNOSIS — R918 Other nonspecific abnormal finding of lung field: Secondary | ICD-10-CM | POA: Diagnosis not present

## 2022-04-03 DIAGNOSIS — R931 Abnormal findings on diagnostic imaging of heart and coronary circulation: Secondary | ICD-10-CM | POA: Diagnosis not present

## 2022-04-03 DIAGNOSIS — I10 Essential (primary) hypertension: Secondary | ICD-10-CM | POA: Diagnosis not present

## 2022-04-03 DIAGNOSIS — I25119 Atherosclerotic heart disease of native coronary artery with unspecified angina pectoris: Secondary | ICD-10-CM | POA: Diagnosis not present

## 2022-04-03 DIAGNOSIS — R9431 Abnormal electrocardiogram [ECG] [EKG]: Secondary | ICD-10-CM | POA: Diagnosis not present

## 2022-04-03 DIAGNOSIS — E785 Hyperlipidemia, unspecified: Secondary | ICD-10-CM | POA: Diagnosis not present

## 2022-04-03 DIAGNOSIS — J439 Emphysema, unspecified: Secondary | ICD-10-CM | POA: Diagnosis not present

## 2022-04-09 DIAGNOSIS — R931 Abnormal findings on diagnostic imaging of heart and coronary circulation: Secondary | ICD-10-CM | POA: Diagnosis not present

## 2022-04-09 DIAGNOSIS — I10 Essential (primary) hypertension: Secondary | ICD-10-CM | POA: Diagnosis not present

## 2022-04-09 DIAGNOSIS — Z01818 Encounter for other preprocedural examination: Secondary | ICD-10-CM | POA: Diagnosis not present

## 2022-04-16 ENCOUNTER — Encounter
Admission: RE | Disposition: A | Discharge status: Home / Self Care | Payer: Self-pay | Source: Home / Self Care | Attending: Cardiology

## 2022-04-16 ENCOUNTER — Ambulatory Visit
Admission: RE | Admit: 2022-04-16 | Discharge: 2022-04-16 | Disposition: A | Discharge status: Home / Self Care | Payer: PPO | Attending: Cardiology | Admitting: Cardiology

## 2022-04-16 ENCOUNTER — Other Ambulatory Visit: Payer: Self-pay

## 2022-04-16 ENCOUNTER — Encounter: Payer: Self-pay | Admitting: Cardiology

## 2022-04-16 DIAGNOSIS — I2510 Atherosclerotic heart disease of native coronary artery without angina pectoris: Secondary | ICD-10-CM | POA: Insufficient documentation

## 2022-04-16 DIAGNOSIS — Z9861 Coronary angioplasty status: Secondary | ICD-10-CM | POA: Diagnosis not present

## 2022-04-16 DIAGNOSIS — I25119 Atherosclerotic heart disease of native coronary artery with unspecified angina pectoris: Secondary | ICD-10-CM | POA: Diagnosis not present

## 2022-04-16 DIAGNOSIS — R931 Abnormal findings on diagnostic imaging of heart and coronary circulation: Secondary | ICD-10-CM | POA: Diagnosis not present

## 2022-04-16 DIAGNOSIS — Z01818 Encounter for other preprocedural examination: Secondary | ICD-10-CM

## 2022-04-16 HISTORY — DX: Hyperlipidemia, unspecified: E78.5

## 2022-04-16 HISTORY — PX: LEFT HEART CATH AND CORONARY ANGIOGRAPHY: CATH118249

## 2022-04-16 SURGERY — LEFT HEART CATH AND CORONARY ANGIOGRAPHY
Anesthesia: Moderate Sedation

## 2022-04-16 MED ORDER — ASPIRIN 81 MG PO CHEW
81.0000 mg | CHEWABLE_TABLET | ORAL | Status: AC
  Administered 2022-04-16: 81 mg via ORAL

## 2022-04-16 MED ORDER — ACETAMINOPHEN 325 MG PO TABS
650.0000 mg | ORAL_TABLET | ORAL | Status: DC | PRN
Start: 2022-04-16 — End: 2022-04-16

## 2022-04-16 MED ORDER — SODIUM CHLORIDE 0.9 % WEIGHT BASED INFUSION
3.0000 mL/kg/h | INTRAVENOUS | Status: AC
  Administered 2022-04-16: 2.994 mL/kg/h via INTRAVENOUS

## 2022-04-16 MED ORDER — FENTANYL CITRATE (PF) 100 MCG/2ML IJ SOLN
INTRAMUSCULAR | Status: AC
  Filled 2022-04-16: qty 2

## 2022-04-16 MED ORDER — HEPARIN (PORCINE) IN NACL 1000-0.9 UT/500ML-% IV SOLN
INTRAVENOUS | Status: AC
  Filled 2022-04-16: qty 1000

## 2022-04-16 MED ORDER — ONDANSETRON HCL 4 MG/2ML IJ SOLN: 4.0000 mg | Freq: Four times a day (QID) | INTRAMUSCULAR | Status: DC | PRN

## 2022-04-16 MED ORDER — LIDOCAINE HCL (PF) 1 % IJ SOLN
INTRAMUSCULAR | Status: DC | PRN
  Administered 2022-04-16: 2 mL

## 2022-04-16 MED ORDER — SODIUM CHLORIDE 0.9 % WEIGHT BASED INFUSION
1.0000 mL/kg/h | INTRAVENOUS | Status: DC
Start: 2022-04-16 — End: 2022-04-16

## 2022-04-16 MED ORDER — ASPIRIN 81 MG PO CHEW
CHEWABLE_TABLET | ORAL | Status: AC
  Filled 2022-04-16: qty 1

## 2022-04-16 MED ORDER — HEPARIN SODIUM (PORCINE) 1000 UNIT/ML IJ SOLN
INTRAMUSCULAR | Status: DC | PRN
  Administered 2022-04-16: 4500 [IU] via INTRAVENOUS

## 2022-04-16 MED ORDER — HYDRALAZINE HCL 20 MG/ML IJ SOLN: 10.0000 mg | INTRAMUSCULAR | Status: DC | PRN

## 2022-04-16 MED ORDER — LABETALOL HCL 5 MG/ML IV SOLN: 10.0000 mg | INTRAVENOUS | Status: DC | PRN

## 2022-04-16 MED ORDER — IOHEXOL 300 MG/ML  SOLN
INTRAMUSCULAR | Status: DC | PRN
  Administered 2022-04-16: 164 mL

## 2022-04-16 MED ORDER — MIDAZOLAM HCL 2 MG/2ML IJ SOLN
INTRAMUSCULAR | Status: DC | PRN
  Administered 2022-04-16: 1 mg via INTRAVENOUS

## 2022-04-16 MED ORDER — SODIUM CHLORIDE 0.9 % WEIGHT BASED INFUSION
1.0000 mL/kg/h | INTRAVENOUS | Status: DC
  Administered 2022-04-16: 1 mL/kg/h via INTRAVENOUS

## 2022-04-16 MED ORDER — VERAPAMIL HCL 2.5 MG/ML IV SOLN
INTRAVENOUS | Status: AC
  Filled 2022-04-16: qty 2

## 2022-04-16 MED ORDER — SODIUM CHLORIDE 0.9 % IV SOLN: 250.0000 mL | INTRAVENOUS | Status: DC | PRN

## 2022-04-16 MED ORDER — SODIUM CHLORIDE 0.9% FLUSH: 3.0000 mL | Freq: Two times a day (BID) | INTRAVENOUS | Status: DC

## 2022-04-16 MED ORDER — MIDAZOLAM HCL 2 MG/2ML IJ SOLN
INTRAMUSCULAR | Status: AC
  Filled 2022-04-16: qty 2

## 2022-04-16 MED ORDER — SODIUM CHLORIDE 0.9% FLUSH: 3.0000 mL | INTRAVENOUS | Status: DC | PRN

## 2022-04-16 MED ORDER — HEPARIN SODIUM (PORCINE) 1000 UNIT/ML IJ SOLN
INTRAMUSCULAR | Status: AC
  Filled 2022-04-16: qty 10

## 2022-04-16 MED ORDER — VERAPAMIL HCL 2.5 MG/ML IV SOLN
INTRAVENOUS | Status: DC | PRN
  Administered 2022-04-16: 2.5 mg via INTRA_ARTERIAL

## 2022-04-16 MED ORDER — FENTANYL CITRATE (PF) 100 MCG/2ML IJ SOLN
INTRAMUSCULAR | Status: DC | PRN
  Administered 2022-04-16: 50 ug via INTRAVENOUS

## 2022-04-16 MED ORDER — HEPARIN (PORCINE) IN NACL 2000-0.9 UNIT/L-% IV SOLN
INTRAVENOUS | Status: DC | PRN
  Administered 2022-04-16: 1000 mL

## 2022-04-16 MED ORDER — LIDOCAINE HCL 1 % IJ SOLN
INTRAMUSCULAR | Status: AC
  Filled 2022-04-16: qty 20

## 2022-04-16 SURGICAL SUPPLY — 14 items
BAND ZEPHYR COMPRESS 30 LONG (HEMOSTASIS) ×1 IMPLANT
CATH 5F 110X4 TIG (CATHETERS) ×1 IMPLANT
CATH 5FR JL3.5 JR4 ANG PIG MP (CATHETERS) ×1 IMPLANT
CATH AMP RT 5F (CATHETERS) ×1 IMPLANT
CATH INFINITI 5 FR 3DRC (CATHETERS) ×1 IMPLANT
CATH INFINITI 5FR JL5 (CATHETERS) ×1 IMPLANT
DRAPE BRACHIAL (DRAPES) ×1 IMPLANT
GLIDESHEATH SLEND SS 6F .021 (SHEATH) ×1 IMPLANT
GUIDEWIRE INQWIRE 1.5J.035X260 (WIRE) IMPLANT
INQWIRE 1.5J .035X260CM (WIRE) ×2
PACK CARDIAC CATH (CUSTOM PROCEDURE TRAY) ×2 IMPLANT
PROTECTION STATION PRESSURIZED (MISCELLANEOUS) ×2
SET ATX SIMPLICITY (MISCELLANEOUS) ×1 IMPLANT
STATION PROTECTION PRESSURIZED (MISCELLANEOUS) IMPLANT

## 2022-04-23 DIAGNOSIS — I10 Essential (primary) hypertension: Secondary | ICD-10-CM | POA: Diagnosis not present

## 2022-04-23 DIAGNOSIS — R001 Bradycardia, unspecified: Secondary | ICD-10-CM | POA: Diagnosis not present

## 2022-04-23 DIAGNOSIS — E7800 Pure hypercholesterolemia, unspecified: Secondary | ICD-10-CM | POA: Diagnosis not present

## 2022-04-23 DIAGNOSIS — Z9889 Other specified postprocedural states: Secondary | ICD-10-CM | POA: Diagnosis not present

## 2022-05-01 ENCOUNTER — Institutional Professional Consult (permissible substitution): Payer: PPO | Admitting: Thoracic Surgery (Cardiothoracic Vascular Surgery)

## 2022-05-01 ENCOUNTER — Encounter: Payer: Self-pay | Admitting: Thoracic Surgery (Cardiothoracic Vascular Surgery)

## 2022-05-01 ENCOUNTER — Other Ambulatory Visit: Payer: Self-pay | Admitting: *Deleted

## 2022-05-01 VITALS — BP 170/80 | HR 63 | Resp 20 | Ht 73.0 in | Wt 213.0 lb

## 2022-05-01 DIAGNOSIS — I2510 Atherosclerotic heart disease of native coronary artery without angina pectoris: Secondary | ICD-10-CM

## 2022-05-01 NOTE — Progress Notes (Signed)
PCP is [name], MD Referring Provider is Marcina Millard, MD  Chief Complaint  Patient presents with   Coronary Artery Disease    Surgical consult, cardiac cath 04/16/22    HPI: Mr. Bosket is sent for consultation regarding left main and three-vessel coronary disease.  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  No prior history of cardiac disease, but does have a strong family history.  Because of his family history he had a workup by Dr. Lady Gary a couple of years ago.  An ETT was equivocal.  No additional testing was done.  He is very active playing golf 2-3 times a week and was walking at least 3 miles a day.  He recently has been noting sensation of severe fatigue after physical activity that takes about 30 minutes to resolve.  He also had an episode of chest pressure and severe fatigue while carrying some sticks up an incline.  He saw Dr. Darrold Junker.  A cardiac CT showed extensive atherosclerotic disease involving the left main and all 3 major vessels.  Cardiac catheterization on 04/16/2022 showed a 60% distal left main and severe diffuse three-vessel disease.  Ejection fraction was approximately 40%.  He was referred for coronary bypass grafting.   Past Medical History:  Diagnosis Date   Arthritis    History of kidney stones    Hyperlipidemia    Hypertension    Nocturia     Past Surgical History:  Procedure Laterality Date   APPENDECTOMY     CATARACT EXTRACTION W/PHACO Right 11/27/2020   Procedure: CATARACT EXTRACTION PHACO AND INTRAOCULAR LENS PLACEMENT (IOC) RIGHT VIVITY LENS toric 6.31 00.41.8;  Surgeon: Galen Manila, MD;  Location: [facility];  Service: Ophthalmology;  Laterality: Right;   KNEE ARTHROSCOPY Left    KNEE ARTHROSCOPY WITH LATERAL MENISECTOMY Right 11/13/2020   Procedure: Right knee arthroscopy, partial  medial and lateral meniscectomy;  Surgeon: Kennedy Bucker, MD;  Location: [facility];  Service: Orthopedics;  Laterality: Right;   LEFT HEART CATH AND CORONARY ANGIOGRAPHY N/A 04/16/2022   Procedure: LEFT HEART CATH AND CORONARY ANGIOGRAPHY;  Surgeon: Marcina Millard, MD;  Location: [facility];  Service: Cardiovascular;  Laterality: N/A;   POLYPECTOMY     WITH COLONOSCOPY   TOTAL KNEE ARTHROPLASTY Left 07/23/2015   Procedure: LEFT TOTAL KNEE ARTHROPLASTY;  Surgeon: Ollen Gross, MD;  Location: [facility];  Service: Orthopedics;  Laterality: Left;   TOTAL KNEE ARTHROPLASTY Right 06/11/2021   Procedure: TOTAL KNEE ARTHROPLASTY;  Surgeon: Kennedy Bucker, MD;  Location: [facility];  Service: Orthopedics;  Laterality: Right;   TRANSURETHRAL RESECTION OF PROSTATE  ?2015   URETEROSCOPY WITH HOLMIUM LASER LITHOTRIPSY      Family History  Problem Relation Age of Onset   Hypertension Father    Cancer Father     Social History Social History   Tobacco Use   Smoking status: Former    Types: Cigarettes    Quit date: 07/17/1975    Years since quitting: 46.8   Smokeless tobacco: Never  Vaping Use   Vaping Use: Never used  Substance Use Topics   Alcohol use: No   Drug use: No    Current Outpatient Medications  Medication Sig Dispense Refill   aspirin EC 81 MG tablet Take 81 mg by mouth daily.     atorvastatin (LIPITOR) 10 MG tablet Take 10 mg by mouth daily.  diltiazem (TIAZAC) 240 MG 24 hr capsule Take 240 mg by mouth daily.     fosinopril (MONOPRIL) 20 MG tablet Take 20 mg by mouth daily.     hydrochlorothiazide (HYDRODIURIL) 25 MG tablet Take 25 mg by mouth daily.     HYDROcodone-acetaminophen (NORCO/VICODIN) 5-325 MG tablet Take 1-2 tablets by mouth every 4 (four) hours as needed for moderate pain (pain score 4-6). (Patient not taking: Reported on 04/16/2022) 30 tablet 0   polyethylene glycol (MIRALAX / GLYCOLAX) 17 g packet Take 17 g by mouth daily as needed for mild  constipation. 14 each 0   traMADol (ULTRAM) 50 MG tablet Take 1 tablet (50 mg total) by mouth every 6 (six) hours as needed. (Patient not taking: Reported on 04/16/2022) 30 tablet 0   No current facility-administered medications for this visit.    No Known Allergies  Review of Systems  Constitutional:  Positive for activity change and fatigue. Negative for unexpected weight change.  HENT:  Negative for trouble swallowing and voice change.   Respiratory:  Negative for shortness of breath and wheezing.   Cardiovascular:  Positive for chest pain (See HPI). Negative for leg swelling.  Gastrointestinal:  Negative for abdominal distention and abdominal pain.  Genitourinary:  Positive for frequency. Negative for dysuria.  Musculoskeletal:  Positive for arthralgias (both knees replaced).  Neurological:  Positive for numbness (in both feet). Negative for syncope, speech difficulty and weakness.  Hematological:  Negative for adenopathy. Does not bruise/bleed easily.  All other systems reviewed and are negative.   BP (!) 170/80   Pulse 63   Resp 20   Ht 6\' 1"  (1.854 m)   Wt 213 lb (96.6 kg)   SpO2 97%   BMI 28.10 kg/m  Physical Exam Vitals reviewed.  Constitutional:      General: He is not in acute distress.    Appearance: He is normal weight.  HENT:     Head: Normocephalic and atraumatic.  Eyes:     General: No scleral icterus.    Extraocular Movements: Extraocular movements intact.  Neck:     Vascular: No carotid bruit.  Cardiovascular:     Rate and Rhythm: Normal rate and regular rhythm.     Heart sounds: Normal heart sounds. No murmur heard.    No friction rub. No gallop.  Pulmonary:     Effort: Pulmonary effort is normal. No respiratory distress.     Breath sounds: Normal breath sounds. No wheezing or rales.  Abdominal:     General: There is no distension.     Palpations: Abdomen is soft.  Musculoskeletal:        General: No swelling.     Comments: Varicose veins below  knee bilaterally.  Normal Allen's test left radial  Skin:    General: Skin is warm and dry.  Neurological:     General: No focal deficit present.     Mental Status: He is oriented to person, place, and time.     Cranial Nerves: No cranial nerve deficit.     Motor: No weakness.     Gait: Gait normal.     Diagnostic Tests: Cardiac catheterization 04/16/2022   Prox RCA-1 lesion is 50% stenosed.   Prox RCA-2 lesion is 60% stenosed.   Dist RCA lesion is 80% stenosed.   RPDA-1 lesion is 70% stenosed.   RPDA-2 lesion is 75% stenosed.   Mid Cx lesion is 75% stenosed.   2nd Mrg lesion is 90% stenosed.   Ost Cx  to Prox Cx lesion is 80% stenosed.   Mid LM to Dist LM lesion is 60% stenosed.   Prox LAD lesion is 70% stenosed.   Mid LAD lesion is 70% stenosed.   There is mild left ventricular systolic dysfunction.   LV end diastolic pressure is moderately elevated.   The left ventricular ejection fraction is 35-45% by visual estimate.   1.  Three-vessel and left main disease with 60 to 70% stenosis distal left main, 70% stenosis proximal and mid LAD, 80% stenosis ostial left circumflex, occluded small OM 2, distal 80% stenosis RCA, sequential 70% and 75% stenosis RPDA. 2.  Mildly reduced left ventricular function, with global hypokinesis, with estimated LV ejection fraction of 40%  I personally reviewed the catheterization images.  There is severe three-vessel coronary disease and 60% distal left main stenosis.  EF approximately 40%.  Impression: Raymon Roselle is an 80-year-old gentleman with cardiac risk factors including hypertension, hyperlipidemia, age, and a strong family history.  He recently has been experiencing exertional fatigue and also had an episode of classic angina while carrying some limbs up an incline.  He had a cardiac CT and then catheterization which showed left main and severe three-vessel coronary disease.  Ejection fraction is approximately 40%.  Extensive disease  not amenable to percutaneous intervention.  Options are CABG and medical therapy.  I reviewed with him that CABG is associated with survival benefit, improved exercise tolerance, and relief of symptoms relative to medical therapy.  I informed him and his significant other of the general nature of the procedure including the need for general anesthesia, the incisions to be used, the use of cardiopulmonary bypass, the use of drainage tubes and pacemaker wires postoperatively, the expected hospital stay, and the overall recovery.  I informed him of the indications, risks, benefits, and alternatives.  He understands the risks include, but not limited to death, MI, DVT, PE, bleeding, possible need for transfusion, infection, cardiac arrhythmias, respiratory or renal failure, as well as possibility of other unforeseeable complications.  He accepts the risk and agrees to proceed.  He does have some varicose veins.  Will have to assess those intraoperatively to see if they are suitable for use as bypass grafts.  If not may have to consider bilateral mammary's or left radial.  He does have a normal Allen's test on the left side.  Plan: Coronary bypass grafting on Thursday, 05/08/2022 Advised him to avoid heavy activity prior to surgery.  Specifically should not do anything that induces fatigue or chest pain.  Conswella Bruney C Kazaria Gaertner, MD [facility] [phone]  

## 2022-05-01 NOTE — H&P (View-Only) (Signed)
PCP is [name], MD Referring Provider is Marcina Millard, MD  Chief Complaint  Patient presents with   Coronary Artery Disease    Surgical consult, cardiac cath 04/16/22    HPI: [name] is sent for consultation regarding left main and three-vessel coronary disease.  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  No prior history of cardiac disease, but does have a strong family history.  Because of his family history he had a workup by Dr. Lady Gary a couple of years ago.  An ETT was equivocal.  No additional testing was done.  He is very active playing golf 2-3 times a week and was walking at least 3 miles a day.  He recently has been noting sensation of severe fatigue after physical activity that takes about 30 minutes to resolve.  He also had an episode of chest pressure and severe fatigue while carrying some sticks up an incline.  He saw Dr. Darrold Junker.  A cardiac CT showed extensive atherosclerotic disease involving the left main and all 3 major vessels.  Cardiac catheterization on 04/16/2022 showed a 60% distal left main and severe diffuse three-vessel disease.  Ejection fraction was approximately 40%.  He was referred for coronary bypass grafting.   Past Medical History:  Diagnosis Date   Arthritis    History of kidney stones    Hyperlipidemia    Hypertension    Nocturia     Past Surgical History:  Procedure Laterality Date   APPENDECTOMY     CATARACT EXTRACTION W/PHACO Right 11/27/2020   Procedure: CATARACT EXTRACTION PHACO AND INTRAOCULAR LENS PLACEMENT (IOC) RIGHT VIVITY LENS toric 6.31 00.41.8;  Surgeon: Galen Manila, MD;  Location: [facility];  Service: Ophthalmology;  Laterality: Right;   KNEE ARTHROSCOPY Left    KNEE ARTHROSCOPY WITH LATERAL MENISECTOMY Right 11/13/2020   Procedure: Right knee arthroscopy, partial  medial and lateral meniscectomy;  Surgeon: Kennedy Bucker, MD;  Location: [facility];  Service: Orthopedics;  Laterality: Right;   LEFT HEART CATH AND CORONARY ANGIOGRAPHY N/A 04/16/2022   Procedure: LEFT HEART CATH AND CORONARY ANGIOGRAPHY;  Surgeon: Marcina Millard, MD;  Location: [facility];  Service: Cardiovascular;  Laterality: N/A;   POLYPECTOMY     WITH COLONOSCOPY   TOTAL KNEE ARTHROPLASTY Left 07/23/2015   Procedure: LEFT TOTAL KNEE ARTHROPLASTY;  Surgeon: Ollen Gross, MD;  Location: [facility];  Service: Orthopedics;  Laterality: Left;   TOTAL KNEE ARTHROPLASTY Right 06/11/2021   Procedure: TOTAL KNEE ARTHROPLASTY;  Surgeon: Kennedy Bucker, MD;  Location: [facility];  Service: Orthopedics;  Laterality: Right;   TRANSURETHRAL RESECTION OF PROSTATE  ?2015   URETEROSCOPY WITH HOLMIUM LASER LITHOTRIPSY      Family History  Problem Relation Age of Onset   Hypertension Father    Cancer Father     Social History Social History   Tobacco Use   Smoking status: Former    Types: Cigarettes    Quit date: 07/17/1975    Years since quitting: 46.8   Smokeless tobacco: Never  Vaping Use   Vaping Use: Never used  Substance Use Topics   Alcohol use: No   Drug use: No    Current Outpatient Medications  Medication Sig Dispense Refill   aspirin EC 81 MG tablet Take 81 mg by mouth daily.     atorvastatin (LIPITOR) 10 MG tablet Take 10 mg by mouth daily.  diltiazem (TIAZAC) 240 MG 24 hr capsule Take 240 mg by mouth daily.     fosinopril (MONOPRIL) 20 MG tablet Take 20 mg by mouth daily.     hydrochlorothiazide (HYDRODIURIL) 25 MG tablet Take 25 mg by mouth daily.     HYDROcodone-acetaminophen (NORCO/VICODIN) 5-325 MG tablet Take 1-2 tablets by mouth every 4 (four) hours as needed for moderate pain (pain score 4-6). (Patient not taking: Reported on 04/16/2022) 30 tablet 0   polyethylene glycol (MIRALAX / GLYCOLAX) 17 g packet Take 17 g by mouth daily as needed for mild  constipation. 14 each 0   traMADol (ULTRAM) 50 MG tablet Take 1 tablet (50 mg total) by mouth every 6 (six) hours as needed. (Patient not taking: Reported on 04/16/2022) 30 tablet 0   No current facility-administered medications for this visit.    No Known Allergies  Review of Systems  Constitutional:  Positive for activity change and fatigue. Negative for unexpected weight change.  HENT:  Negative for trouble swallowing and voice change.   Respiratory:  Negative for shortness of breath and wheezing.   Cardiovascular:  Positive for chest pain (See HPI). Negative for leg swelling.  Gastrointestinal:  Negative for abdominal distention and abdominal pain.  Genitourinary:  Positive for frequency. Negative for dysuria.  Musculoskeletal:  Positive for arthralgias (both knees replaced).  Neurological:  Positive for numbness (in both feet). Negative for syncope, speech difficulty and weakness.  Hematological:  Negative for adenopathy. Does not bruise/bleed easily.  All other systems reviewed and are negative.   BP (!) 170/80   Pulse 63   Resp 20   Ht 6\' 1"  (1.854 m)   Wt 213 lb (96.6 kg)   SpO2 97%   BMI 28.10 kg/m  Physical Exam Vitals reviewed.  Constitutional:      General: He is not in acute distress.    Appearance: He is normal weight.  HENT:     Head: Normocephalic and atraumatic.  Eyes:     General: No scleral icterus.    Extraocular Movements: Extraocular movements intact.  Neck:     Vascular: No carotid bruit.  Cardiovascular:     Rate and Rhythm: Normal rate and regular rhythm.     Heart sounds: Normal heart sounds. No murmur heard.    No friction rub. No gallop.  Pulmonary:     Effort: Pulmonary effort is normal. No respiratory distress.     Breath sounds: Normal breath sounds. No wheezing or rales.  Abdominal:     General: There is no distension.     Palpations: Abdomen is soft.  Musculoskeletal:        General: No swelling.     Comments: Varicose veins below  knee bilaterally.  Normal Allen's test left radial  Skin:    General: Skin is warm and dry.  Neurological:     General: No focal deficit present.     Mental Status: He is oriented to person, place, and time.     Cranial Nerves: No cranial nerve deficit.     Motor: No weakness.     Gait: Gait normal.     Diagnostic Tests: Cardiac catheterization 04/16/2022   Prox RCA-1 lesion is 50% stenosed.   Prox RCA-2 lesion is 60% stenosed.   Dist RCA lesion is 80% stenosed.   RPDA-1 lesion is 70% stenosed.   RPDA-2 lesion is 75% stenosed.   Mid Cx lesion is 75% stenosed.   2nd Mrg lesion is 90% stenosed.   Ost Cx  to Prox Cx lesion is 80% stenosed.   Mid LM to Dist LM lesion is 60% stenosed.   Prox LAD lesion is 70% stenosed.   Mid LAD lesion is 70% stenosed.   There is mild left ventricular systolic dysfunction.   LV end diastolic pressure is moderately elevated.   The left ventricular ejection fraction is 35-45% by visual estimate.   1.  Three-vessel and left main disease with 60 to 70% stenosis distal left main, 70% stenosis proximal and mid LAD, 80% stenosis ostial left circumflex, occluded small OM 2, distal 80% stenosis RCA, sequential 70% and 75% stenosis RPDA. 2.  Mildly reduced left ventricular function, with global hypokinesis, with estimated LV ejection fraction of 40%  I personally reviewed the catheterization images.  There is severe three-vessel coronary disease and 60% distal left main stenosis.  EF approximately 40%.  Impression: Rashawd Laskaris is an 81 year old gentleman with cardiac risk factors including hypertension, hyperlipidemia, age, and a strong family history.  He recently has been experiencing exertional fatigue and also had an episode of classic angina while carrying some limbs up an incline.  He had a cardiac CT and then catheterization which showed left main and severe three-vessel coronary disease.  Ejection fraction is approximately 40%.  Extensive disease  not amenable to percutaneous intervention.  Options are CABG and medical therapy.  I reviewed with him that CABG is associated with survival benefit, improved exercise tolerance, and relief of symptoms relative to medical therapy.  I informed him and his significant other of the general nature of the procedure including the need for general anesthesia, the incisions to be used, the use of cardiopulmonary bypass, the use of drainage tubes and pacemaker wires postoperatively, the expected hospital stay, and the overall recovery.  I informed him of the indications, risks, benefits, and alternatives.  He understands the risks include, but not limited to death, MI, DVT, PE, bleeding, possible need for transfusion, infection, cardiac arrhythmias, respiratory or renal failure, as well as possibility of other unforeseeable complications.  He accepts the risk and agrees to proceed.  He does have some varicose veins.  Will have to assess those intraoperatively to see if they are suitable for use as bypass grafts.  If not may have to consider bilateral mammary's or left radial.  He does have a normal Allen's test on the left side.  Plan: Coronary bypass grafting on Thursday, 05/08/2022 Advised him to avoid heavy activity prior to surgery.  Specifically should not do anything that induces fatigue or chest pain.  Loreli Slot, MD [facility] [phone]

## 2022-05-02 ENCOUNTER — Encounter: Payer: Self-pay | Admitting: *Deleted

## 2022-05-06 NOTE — Progress Notes (Addendum)
Surgical Instructions    Your procedure is scheduled on Thursday August 17.  Report to [facility] Main Entrance "A" at 5:30 A.M., then check in with the Admitting office.  Call this number if you have problems the morning of surgery:  [phone]   If you have any questions prior to your surgery date call [phone]: Open Monday-Friday 8am-4pm    Remember:  Do not eat or drink anything after midnight the night before your surgery   Take these medicines the morning of surgery with A SIP OF WATER:  atorvastatin (LIPITOR)  diltiazem (TIAZAC)   Continue Aspirin per your surgeon's protocol, but DO NOT TAKE aspirin the morning of surgery.   As of today stop taking any  Aleve, Naproxen, Ibuprofen, Motrin, Advil, Goody's, BC's, all herbal medications, fish oil, and all vitamins.           Do not wear jewelry or makeup. Do not wear lotions, powders, perfumes/cologne or deodorant. Do not shave 48 hours prior to surgery.  Men may shave face and neck. Do not bring valuables to the hospital. Do not wear nail polish, gel polish, artificial nails, or any other type of covering on natural nails (fingers and toes) If you have artificial nails or gel coating that need to be removed by a nail salon, please have this removed prior to surgery. Artificial nails or gel coating may interfere with anesthesia's ability to adequately monitor your vital signs.  [facility] is not responsible for any belongings or valuables.    Do NOT Smoke (Tobacco/Vaping)  24 hours prior to your procedure  If you use a CPAP at night, you may bring your mask for your overnight stay.   Contacts, glasses, hearing aids, dentures or partials may not be worn into surgery, please bring cases for these belongings   For patients admitted to the hospital, discharge time will be determined by your treatment team.   Patients discharged the day of surgery will not be allowed to drive home, and someone needs to stay with them for  24 hours.   SURGICAL WAITING ROOM VISITATION Patients having surgery or a procedure may have no more than 2 support people in the waiting area - these visitors may rotate.   Children under the age of 63 must have an adult with them who is not the patient. If the patient needs to stay at the hospital during part of their recovery, the visitor guidelines for inpatient rooms apply. Pre-op nurse will coordinate an appropriate time for 1 support person to accompany patient in pre-op.  This support person may not rotate.   Please refer to the [facility] website for the visitor guidelines for Inpatients (after your surgery is over and you are in a regular room).    Special instructions:    Oral Hygiene is also important to reduce your risk of infection.  Remember - BRUSH YOUR TEETH THE MORNING OF SURGERY WITH YOUR REGULAR TOOTHPASTE   - Preparing For Surgery  Before surgery, you can play an important role. Because skin is not sterile, your skin needs to be as free of germs as possible. You can reduce the number of germs on your skin by washing with CHG (chlorahexidine gluconate) Soap before surgery.  CHG is an antiseptic cleaner which kills germs and bonds with the skin to continue killing germs even after washing.     Please do not use if you have an allergy to CHG or antibacterial soaps. If your skin becomes reddened/irritated  stop using the CHG.  Do not shave (including legs and underarms) for at least 48 hours prior to first CHG shower. It is OK to shave your face.  Please follow these instructions carefully.     Shower the NIGHT BEFORE SURGERY and the MORNING OF SURGERY with CHG Soap.   If you chose to wash your hair, wash your hair first as usual with your normal shampoo. After you shampoo, rinse your hair and body thoroughly to remove the shampoo.  Then [employer] and genitals (private parts) with your normal soap and rinse thoroughly to remove soap.  After that Use CHG Soap  as you would any other liquid soap. You can apply CHG directly to the skin and wash gently with a scrungie or a clean washcloth.   Apply the CHG Soap to your body ONLY FROM THE NECK DOWN.  Do not use on open wounds or open sores. Avoid contact with your eyes, ears, mouth and genitals (private parts). Wash Face and genitals (private parts)  with your normal soap.   Wash thoroughly, paying special attention to the area where your surgery will be performed.  Thoroughly rinse your body with warm water from the neck down.  DO NOT shower/wash with your normal soap after using and rinsing off the CHG Soap.  Pat yourself dry with a CLEAN TOWEL.  Wear CLEAN PAJAMAS to bed the night before surgery  Place CLEAN SHEETS on your bed the night before your surgery  DO NOT SLEEP WITH PETS.   Day of Surgery:  Take a shower with CHG soap. Wear Clean/Comfortable clothing the morning of surgery Do not apply any deodorants/lotions.   Remember to brush your teeth WITH YOUR REGULAR TOOTHPASTE.    If you received a COVID test during your pre-op visit, it is requested that you wear a mask when out in public, stay away from anyone that may not be feeling well, and notify your surgeon if you develop symptoms. If you have been in contact with anyone that has tested positive in the last 10 days, please notify your surgeon.    Please read over the following fact sheets that you were given.

## 2022-05-07 ENCOUNTER — Encounter (HOSPITAL_COMMUNITY): Payer: Self-pay

## 2022-05-07 ENCOUNTER — Encounter (HOSPITAL_COMMUNITY)
Admission: RE | Admit: 2022-05-07 | Discharge: 2022-05-07 | Disposition: A | Discharge status: Home / Self Care | Payer: PPO | Source: Ambulatory Visit | Attending: Thoracic Surgery (Cardiothoracic Vascular Surgery) | Admitting: Thoracic Surgery (Cardiothoracic Vascular Surgery)

## 2022-05-07 ENCOUNTER — Other Ambulatory Visit: Payer: Self-pay

## 2022-05-07 ENCOUNTER — Ambulatory Visit (HOSPITAL_BASED_OUTPATIENT_CLINIC_OR_DEPARTMENT_OTHER)
Admission: RE | Admit: 2022-05-07 | Discharge: 2022-05-07 | Disposition: A | Discharge status: Home / Self Care | Payer: PPO | Source: Ambulatory Visit | Attending: Thoracic Surgery (Cardiothoracic Vascular Surgery) | Admitting: Thoracic Surgery (Cardiothoracic Vascular Surgery)

## 2022-05-07 ENCOUNTER — Ambulatory Visit (HOSPITAL_COMMUNITY)
Admission: RE | Admit: 2022-05-07 | Discharge: 2022-05-07 | Disposition: A | Discharge status: Home / Self Care | Payer: PPO | Source: Ambulatory Visit | Attending: Thoracic Surgery (Cardiothoracic Vascular Surgery) | Admitting: Thoracic Surgery (Cardiothoracic Vascular Surgery)

## 2022-05-07 VITALS — BP 154/99 | HR 61 | Temp 97.7°F | Resp 17 | Ht 73.0 in | Wt 211.9 lb

## 2022-05-07 DIAGNOSIS — Z87891 Personal history of nicotine dependence: Secondary | ICD-10-CM | POA: Diagnosis not present

## 2022-05-07 DIAGNOSIS — I2510 Atherosclerotic heart disease of native coronary artery without angina pectoris: Secondary | ICD-10-CM | POA: Diagnosis not present

## 2022-05-07 DIAGNOSIS — Z87442 Personal history of urinary calculi: Secondary | ICD-10-CM | POA: Diagnosis not present

## 2022-05-07 DIAGNOSIS — D696 Thrombocytopenia, unspecified: Secondary | ICD-10-CM | POA: Diagnosis not present

## 2022-05-07 DIAGNOSIS — Y838 Other surgical procedures as the cause of abnormal reaction of the patient, or of later complication, without mention of misadventure at the time of the procedure: Secondary | ICD-10-CM | POA: Diagnosis not present

## 2022-05-07 DIAGNOSIS — Z7982 Long term (current) use of aspirin: Secondary | ICD-10-CM | POA: Diagnosis not present

## 2022-05-07 DIAGNOSIS — I8390 Asymptomatic varicose veins of unspecified lower extremity: Secondary | ICD-10-CM | POA: Diagnosis present

## 2022-05-07 DIAGNOSIS — I509 Heart failure, unspecified: Secondary | ICD-10-CM | POA: Diagnosis not present

## 2022-05-07 DIAGNOSIS — Z8249 Family history of ischemic heart disease and other diseases of the circulatory system: Secondary | ICD-10-CM | POA: Diagnosis not present

## 2022-05-07 DIAGNOSIS — Z4682 Encounter for fitting and adjustment of non-vascular catheter: Secondary | ICD-10-CM | POA: Diagnosis not present

## 2022-05-07 DIAGNOSIS — Z96653 Presence of artificial knee joint, bilateral: Secondary | ICD-10-CM | POA: Diagnosis present

## 2022-05-07 DIAGNOSIS — I110 Hypertensive heart disease with heart failure: Secondary | ICD-10-CM | POA: Diagnosis not present

## 2022-05-07 DIAGNOSIS — I088 Other rheumatic multiple valve diseases: Secondary | ICD-10-CM | POA: Diagnosis not present

## 2022-05-07 DIAGNOSIS — J9811 Atelectasis: Secondary | ICD-10-CM | POA: Diagnosis not present

## 2022-05-07 DIAGNOSIS — R7303 Prediabetes: Secondary | ICD-10-CM | POA: Diagnosis not present

## 2022-05-07 DIAGNOSIS — Z79899 Other long term (current) drug therapy: Secondary | ICD-10-CM | POA: Diagnosis not present

## 2022-05-07 DIAGNOSIS — I10 Essential (primary) hypertension: Secondary | ICD-10-CM | POA: Diagnosis not present

## 2022-05-07 DIAGNOSIS — Z01818 Encounter for other preprocedural examination: Secondary | ICD-10-CM | POA: Insufficient documentation

## 2022-05-07 DIAGNOSIS — Z20822 Contact with and (suspected) exposure to covid-19: Secondary | ICD-10-CM | POA: Diagnosis not present

## 2022-05-07 DIAGNOSIS — E785 Hyperlipidemia, unspecified: Secondary | ICD-10-CM | POA: Diagnosis not present

## 2022-05-07 DIAGNOSIS — Z951 Presence of aortocoronary bypass graft: Secondary | ICD-10-CM | POA: Diagnosis not present

## 2022-05-07 DIAGNOSIS — N400 Enlarged prostate without lower urinary tract symptoms: Secondary | ICD-10-CM | POA: Diagnosis not present

## 2022-05-07 DIAGNOSIS — J8489 Other specified interstitial pulmonary diseases: Secondary | ICD-10-CM | POA: Diagnosis not present

## 2022-05-07 DIAGNOSIS — I97190 Other postprocedural cardiac functional disturbances following cardiac surgery: Secondary | ICD-10-CM | POA: Diagnosis not present

## 2022-05-07 DIAGNOSIS — I480 Paroxysmal atrial fibrillation: Secondary | ICD-10-CM | POA: Diagnosis not present

## 2022-05-07 DIAGNOSIS — J90 Pleural effusion, not elsewhere classified: Secondary | ICD-10-CM | POA: Diagnosis not present

## 2022-05-07 LAB — CBC
HCT: 50.6 % (ref 39.0–52.0)
Hemoglobin: 16.5 g/dL (ref 13.0–17.0)
MCH: 29.8 pg (ref 26.0–34.0)
MCHC: 32.6 g/dL (ref 30.0–36.0)
MCV: 91.3 fL (ref 80.0–100.0)
Platelets: 157 10*3/uL (ref 150–400)
RBC: 5.54 MIL/uL (ref 4.22–5.81)
RDW: 13.5 % (ref 11.5–15.5)
WBC: 6.4 10*3/uL (ref 4.0–10.5)
nRBC: 0 % (ref 0.0–0.2)

## 2022-05-07 LAB — HEMOGLOBIN A1C
Hgb A1c MFr Bld: 6.3 % — ABNORMAL HIGH (ref 4.8–5.6)
Mean Plasma Glucose: 134.11 mg/dL

## 2022-05-07 LAB — COMPREHENSIVE METABOLIC PANEL
ALT: 21 U/L (ref 0–44)
AST: 18 U/L (ref 15–41)
Albumin: 4.5 g/dL (ref 3.5–5.0)
Alkaline Phosphatase: 55 U/L (ref 38–126)
Anion gap: 9 (ref 5–15)
BUN: 19 mg/dL (ref 8–23)
CO2: 29 mmol/L (ref 22–32)
Calcium: 10 mg/dL (ref 8.9–10.3)
Chloride: 105 mmol/L (ref 98–111)
Creatinine, Ser: 1.36 mg/dL — ABNORMAL HIGH (ref 0.61–1.24)
GFR, Estimated: 52 mL/min — ABNORMAL LOW (ref >60)
Glucose, Bld: 145 mg/dL — ABNORMAL HIGH (ref 70–99)
Potassium: 4.4 mmol/L (ref 3.5–5.1)
Sodium: 143 mmol/L (ref 135–145)
Total Bilirubin: 1.1 mg/dL (ref 0.3–1.2)
Total Protein: 7.5 g/dL (ref 6.5–8.1)

## 2022-05-07 LAB — URINALYSIS, ROUTINE W REFLEX MICROSCOPIC
Bilirubin Urine: NEGATIVE
Glucose, UA: NEGATIVE mg/dL
Ketones, ur: NEGATIVE mg/dL
Leukocytes,Ua: NEGATIVE
Nitrite: NEGATIVE
Protein, ur: NEGATIVE mg/dL
Specific Gravity, Urine: 1.016 (ref 1.005–1.030)
pH: 6 (ref 5.0–8.0)

## 2022-05-07 LAB — APTT: aPTT: 34 seconds (ref 24–36)

## 2022-05-07 LAB — TYPE AND SCREEN
ABO/RH(D): O POS
Antibody Screen: NEGATIVE

## 2022-05-07 LAB — PROTIME-INR
INR: 1 (ref 0.8–1.2)
Prothrombin Time: 13.4 seconds (ref 11.4–15.2)

## 2022-05-07 LAB — SURGICAL PCR SCREEN
MRSA, PCR: NEGATIVE
Staphylococcus aureus: NEGATIVE

## 2022-05-07 LAB — SARS CORONAVIRUS 2 BY RT PCR: SARS Coronavirus 2 by RT PCR: NEGATIVE

## 2022-05-07 MED ORDER — NITROGLYCERIN IN D5W 200-5 MCG/ML-% IV SOLN
2.0000 ug/min | INTRAVENOUS | Status: AC
  Administered 2022-05-08: 10 ug/min via INTRAVENOUS
  Filled 2022-05-07: qty 250

## 2022-05-07 MED ORDER — VANCOMYCIN HCL 1500 MG/300ML IV SOLN
1500.0000 mg | INTRAVENOUS | Status: AC
  Administered 2022-05-08: 1500 mg via INTRAVENOUS
  Filled 2022-05-07: qty 300

## 2022-05-07 MED ORDER — MAGNESIUM SULFATE 50 % IJ SOLN
40.0000 meq | INTRAMUSCULAR | Status: DC
  Filled 2022-05-07: qty 9.85

## 2022-05-07 MED ORDER — TRANEXAMIC ACID (OHS) BOLUS VIA INFUSION
15.0000 mg/kg | INTRAVENOUS | Status: AC
  Administered 2022-05-08: 1441.5 mg via INTRAVENOUS
  Filled 2022-05-07: qty 1442

## 2022-05-07 MED ORDER — TRANEXAMIC ACID 1000 MG/10ML IV SOLN
1.5000 mg/kg/h | INTRAVENOUS | Status: AC
  Administered 2022-05-08: 1.5 mg/kg/h via INTRAVENOUS
  Filled 2022-05-07: qty 25

## 2022-05-07 MED ORDER — INSULIN REGULAR(HUMAN) IN NACL 100-0.9 UT/100ML-% IV SOLN
INTRAVENOUS | Status: AC
  Administered 2022-05-08: 1.1 [IU]/h via INTRAVENOUS
  Filled 2022-05-07: qty 100

## 2022-05-07 MED ORDER — EPINEPHRINE HCL 5 MG/250ML IV SOLN IN NS
0.0000 ug/min | INTRAVENOUS | Status: AC
  Administered 2022-05-08: 2 ug/min via INTRAVENOUS
  Filled 2022-05-07: qty 250

## 2022-05-07 MED ORDER — HEPARIN 30,000 UNITS/1000 ML (OHS) CELLSAVER SOLUTION
Status: DC
  Filled 2022-05-07: qty 1000

## 2022-05-07 MED ORDER — POTASSIUM CHLORIDE 2 MEQ/ML IV SOLN
80.0000 meq | INTRAVENOUS | Status: DC
  Filled 2022-05-07: qty 40

## 2022-05-07 MED ORDER — PHENYLEPHRINE HCL-NACL 20-0.9 MG/250ML-% IV SOLN
30.0000 ug/min | INTRAVENOUS | Status: AC
  Administered 2022-05-08: 20 ug/min via INTRAVENOUS
  Filled 2022-05-07: qty 250

## 2022-05-07 MED ORDER — PLASMA-LYTE A IV SOLN
INTRAVENOUS | Status: DC
  Filled 2022-05-07: qty 2.5

## 2022-05-07 MED ORDER — NOREPINEPHRINE 4 MG/250ML-% IV SOLN
0.0000 ug/min | INTRAVENOUS | Status: DC
  Filled 2022-05-07: qty 250

## 2022-05-07 MED ORDER — MILRINONE LACTATE IN DEXTROSE 20-5 MG/100ML-% IV SOLN
0.3000 ug/kg/min | INTRAVENOUS | Status: AC
  Administered 2022-05-08: .25 ug/kg/min via INTRAVENOUS
  Filled 2022-05-07: qty 100

## 2022-05-07 MED ORDER — CEFAZOLIN SODIUM-DEXTROSE 2-4 GM/100ML-% IV SOLN
2.0000 g | INTRAVENOUS | Status: AC
  Administered 2022-05-08 (×2): 2 g via INTRAVENOUS
  Filled 2022-05-07: qty 100

## 2022-05-07 MED ORDER — TRANEXAMIC ACID (OHS) PUMP PRIME SOLUTION
2.0000 mg/kg | INTRAVENOUS | Status: DC
  Filled 2022-05-07: qty 1.92

## 2022-05-07 MED ORDER — DEXMEDETOMIDINE HCL IN NACL 400 MCG/100ML IV SOLN
0.1000 ug/kg/h | INTRAVENOUS | Status: AC
  Administered 2022-05-08: .7 ug/kg/h via INTRAVENOUS
  Filled 2022-05-07 (×2): qty 100

## 2022-05-07 MED ORDER — CEFAZOLIN SODIUM-DEXTROSE 2-4 GM/100ML-% IV SOLN
2.0000 g | INTRAVENOUS | Status: DC
  Filled 2022-05-07: qty 100

## 2022-05-07 NOTE — Progress Notes (Signed)
Pre-CABG Dopplers completed. Refer to "CV Proc" under chart review to view preliminary results.  05/07/2022 10:57 AM Eula Fried., MHA, RVT, RDCS, RDMS

## 2022-05-07 NOTE — Progress Notes (Addendum)
PCP - Mackie Pai Cardiologist - Alexander Paraschos,MD  PPM/ICD - denies Device Orders -  Rep Notified -   Chest x-ray - 05/07/22 EKG - 04/16/22 Stress Test - none ECHO - none Cardiac Cath - 04/16/22  Sleep Study - none CPAP - no  Fasting Blood Sugar - na Checks Blood Sugar _____ times a day  Blood Thinner Instructions:na Aspirin Instructions:LD 8/16. Hold DOS.  ERAS Protcol -no PRE-SURGERY Ensure or G2-   COVID TEST- 05/07/22   Anesthesia review: no  Patient denies shortness of breath, fever, cough and chest pain at PAT appointment   All instructions explained to the patient, with a verbal understanding of the material. Patient agrees to go over the instructions while at home for a better understanding. Patient also instructed to wear a mask when out in public prior to surgery after being tested for COVID-19. The opportunity to ask questions was provided.

## 2022-05-08 ENCOUNTER — Inpatient Hospital Stay (HOSPITAL_COMMUNITY): Payer: PPO

## 2022-05-08 ENCOUNTER — Inpatient Hospital Stay (HOSPITAL_COMMUNITY): Payer: PPO | Admitting: Certified Registered"

## 2022-05-08 ENCOUNTER — Other Ambulatory Visit: Payer: Self-pay

## 2022-05-08 ENCOUNTER — Encounter (HOSPITAL_COMMUNITY): Payer: Self-pay | Admitting: Thoracic Surgery (Cardiothoracic Vascular Surgery)

## 2022-05-08 ENCOUNTER — Inpatient Hospital Stay (HOSPITAL_COMMUNITY)
Admission: RE | Disposition: A | Discharge status: Home / Self Care | Payer: Self-pay | Source: Home / Self Care | Attending: Thoracic Surgery (Cardiothoracic Vascular Surgery)

## 2022-05-08 ENCOUNTER — Inpatient Hospital Stay (HOSPITAL_COMMUNITY)
Admission: RE | Admit: 2022-05-08 | Discharge: 2022-05-14 | DRG: 236 | Disposition: A | Discharge status: Home Health | Payer: PPO | Attending: Thoracic Surgery (Cardiothoracic Vascular Surgery) | Admitting: Thoracic Surgery (Cardiothoracic Vascular Surgery)

## 2022-05-08 DIAGNOSIS — I10 Essential (primary) hypertension: Secondary | ICD-10-CM | POA: Diagnosis present

## 2022-05-08 DIAGNOSIS — Y838 Other surgical procedures as the cause of abnormal reaction of the patient, or of later complication, without mention of misadventure at the time of the procedure: Secondary | ICD-10-CM | POA: Diagnosis not present

## 2022-05-08 DIAGNOSIS — I2510 Atherosclerotic heart disease of native coronary artery without angina pectoris: Principal | ICD-10-CM | POA: Diagnosis present

## 2022-05-08 DIAGNOSIS — Z87891 Personal history of nicotine dependence: Secondary | ICD-10-CM

## 2022-05-08 DIAGNOSIS — I509 Heart failure, unspecified: Secondary | ICD-10-CM

## 2022-05-08 DIAGNOSIS — N400 Enlarged prostate without lower urinary tract symptoms: Secondary | ICD-10-CM | POA: Diagnosis present

## 2022-05-08 DIAGNOSIS — E785 Hyperlipidemia, unspecified: Secondary | ICD-10-CM | POA: Diagnosis not present

## 2022-05-08 DIAGNOSIS — Z87442 Personal history of urinary calculi: Secondary | ICD-10-CM

## 2022-05-08 DIAGNOSIS — I110 Hypertensive heart disease with heart failure: Secondary | ICD-10-CM | POA: Diagnosis not present

## 2022-05-08 DIAGNOSIS — Z20822 Contact with and (suspected) exposure to covid-19: Secondary | ICD-10-CM | POA: Diagnosis present

## 2022-05-08 DIAGNOSIS — D696 Thrombocytopenia, unspecified: Secondary | ICD-10-CM | POA: Diagnosis not present

## 2022-05-08 DIAGNOSIS — I97190 Other postprocedural cardiac functional disturbances following cardiac surgery: Secondary | ICD-10-CM | POA: Diagnosis not present

## 2022-05-08 DIAGNOSIS — Z7982 Long term (current) use of aspirin: Secondary | ICD-10-CM

## 2022-05-08 DIAGNOSIS — Z96653 Presence of artificial knee joint, bilateral: Secondary | ICD-10-CM | POA: Diagnosis present

## 2022-05-08 DIAGNOSIS — I480 Paroxysmal atrial fibrillation: Secondary | ICD-10-CM | POA: Diagnosis not present

## 2022-05-08 DIAGNOSIS — Z79899 Other long term (current) drug therapy: Secondary | ICD-10-CM

## 2022-05-08 DIAGNOSIS — I8390 Asymptomatic varicose veins of unspecified lower extremity: Secondary | ICD-10-CM | POA: Diagnosis present

## 2022-05-08 DIAGNOSIS — Z951 Presence of aortocoronary bypass graft: Principal | ICD-10-CM

## 2022-05-08 DIAGNOSIS — Z8249 Family history of ischemic heart disease and other diseases of the circulatory system: Secondary | ICD-10-CM | POA: Diagnosis not present

## 2022-05-08 DIAGNOSIS — R7303 Prediabetes: Secondary | ICD-10-CM | POA: Diagnosis present

## 2022-05-08 HISTORY — PX: RADIAL ARTERY HARVEST: SHX5067

## 2022-05-08 HISTORY — PX: TEE WITHOUT CARDIOVERSION: SHX5443

## 2022-05-08 HISTORY — PX: CORONARY ARTERY BYPASS GRAFT: SHX141

## 2022-05-08 LAB — POCT I-STAT 7, (LYTES, BLD GAS, ICA,H+H)
Acid-Base Excess: 1 mmol/L (ref 0.0–2.0)
Acid-Base Excess: 3 mmol/L — ABNORMAL HIGH (ref 0.0–2.0)
Acid-Base Excess: 1 mmol/L (ref 0.0–2.0)
Acid-Base Excess: 0 mmol/L (ref 0.0–2.0)
Acid-Base Excess: 3 mmol/L — ABNORMAL HIGH (ref 0.0–2.0)
Acid-Base Excess: 0 mmol/L (ref 0.0–2.0)
Acid-Base Excess: 1 mmol/L (ref 0.0–2.0)
Acid-base deficit: 3 mmol/L — ABNORMAL HIGH (ref 0.0–2.0)
Acid-base deficit: 7 mmol/L — ABNORMAL HIGH (ref 0.0–2.0)
Bicarbonate: 26.5 mmol/L (ref 20.0–28.0)
Bicarbonate: 28.9 mmol/L — ABNORMAL HIGH (ref 20.0–28.0)
Bicarbonate: 25.3 mmol/L (ref 20.0–28.0)
Bicarbonate: 24.1 mmol/L (ref 20.0–28.0)
Bicarbonate: 26.8 mmol/L (ref 20.0–28.0)
Bicarbonate: 24.9 mmol/L (ref 20.0–28.0)
Bicarbonate: 26.2 mmol/L (ref 20.0–28.0)
Bicarbonate: 22.1 mmol/L (ref 20.0–28.0)
Bicarbonate: 19.5 mmol/L — ABNORMAL LOW (ref 20.0–28.0)
Calcium, Ion: 1.23 mmol/L (ref 1.15–1.40)
Calcium, Ion: 1.08 mmol/L — ABNORMAL LOW (ref 1.15–1.40)
Calcium, Ion: 1.04 mmol/L — ABNORMAL LOW (ref 1.15–1.40)
Calcium, Ion: 1.07 mmol/L — ABNORMAL LOW (ref 1.15–1.40)
Calcium, Ion: 1.09 mmol/L — ABNORMAL LOW (ref 1.15–1.40)
Calcium, Ion: 1.09 mmol/L — ABNORMAL LOW (ref 1.15–1.40)
Calcium, Ion: 1.1 mmol/L — ABNORMAL LOW (ref 1.15–1.40)
Calcium, Ion: 1.12 mmol/L — ABNORMAL LOW (ref 1.15–1.40)
Calcium, Ion: 1.14 mmol/L — ABNORMAL LOW (ref 1.15–1.40)
HCT: 43 % (ref 39.0–52.0)
HCT: 30 % — ABNORMAL LOW (ref 39.0–52.0)
HCT: 32 % — ABNORMAL LOW (ref 39.0–52.0)
HCT: 29 % — ABNORMAL LOW (ref 39.0–52.0)
HCT: 32 % — ABNORMAL LOW (ref 39.0–52.0)
HCT: 29 % — ABNORMAL LOW (ref 39.0–52.0)
HCT: 31 % — ABNORMAL LOW (ref 39.0–52.0)
HCT: 33 % — ABNORMAL LOW (ref 39.0–52.0)
HCT: 31 % — ABNORMAL LOW (ref 39.0–52.0)
Hemoglobin: 14.6 g/dL (ref 13.0–17.0)
Hemoglobin: 10.2 g/dL — ABNORMAL LOW (ref 13.0–17.0)
Hemoglobin: 10.9 g/dL — ABNORMAL LOW (ref 13.0–17.0)
Hemoglobin: 10.9 g/dL — ABNORMAL LOW (ref 13.0–17.0)
Hemoglobin: 9.9 g/dL — ABNORMAL LOW (ref 13.0–17.0)
Hemoglobin: 10.5 g/dL — ABNORMAL LOW (ref 13.0–17.0)
Hemoglobin: 9.9 g/dL — ABNORMAL LOW (ref 13.0–17.0)
Hemoglobin: 11.2 g/dL — ABNORMAL LOW (ref 13.0–17.0)
Hemoglobin: 10.5 g/dL — ABNORMAL LOW (ref 13.0–17.0)
O2 Saturation: 100 %
O2 Saturation: 100 %
O2 Saturation: 100 %
O2 Saturation: 100 %
O2 Saturation: 100 %
O2 Saturation: 100 %
O2 Saturation: 100 %
O2 Saturation: 98 %
O2 Saturation: 98 %
Patient temperature: 36
Potassium: 3.6 mmol/L (ref 3.5–5.1)
Potassium: 3.6 mmol/L (ref 3.5–5.1)
Potassium: 4.9 mmol/L (ref 3.5–5.1)
Potassium: 4.8 mmol/L (ref 3.5–5.1)
Potassium: 4.9 mmol/L (ref 3.5–5.1)
Potassium: 4 mmol/L (ref 3.5–5.1)
Potassium: 4.5 mmol/L (ref 3.5–5.1)
Potassium: 3.2 mmol/L — ABNORMAL LOW (ref 3.5–5.1)
Potassium: 4.1 mmol/L (ref 3.5–5.1)
Sodium: 141 mmol/L (ref 135–145)
Sodium: 140 mmol/L (ref 135–145)
Sodium: 140 mmol/L (ref 135–145)
Sodium: 140 mmol/L (ref 135–145)
Sodium: 141 mmol/L (ref 135–145)
Sodium: 141 mmol/L (ref 135–145)
Sodium: 142 mmol/L (ref 135–145)
Sodium: 142 mmol/L (ref 135–145)
Sodium: 140 mmol/L (ref 135–145)
TCO2: 28 mmol/L (ref 22–32)
TCO2: 30 mmol/L (ref 22–32)
TCO2: 26 mmol/L (ref 22–32)
TCO2: 25 mmol/L (ref 22–32)
TCO2: 28 mmol/L (ref 22–32)
TCO2: 26 mmol/L (ref 22–32)
TCO2: 28 mmol/L (ref 22–32)
TCO2: 23 mmol/L (ref 22–32)
TCO2: 21 mmol/L — ABNORMAL LOW (ref 22–32)
pCO2 arterial: 42.7 mmHg (ref 32–48)
pCO2 arterial: 50.5 mmHg — ABNORMAL HIGH (ref 32–48)
pCO2 arterial: 36.4 mmHg (ref 32–48)
pCO2 arterial: 37.3 mmHg (ref 32–48)
pCO2 arterial: 38.2 mmHg (ref 32–48)
pCO2 arterial: 41.4 mmHg (ref 32–48)
pCO2 arterial: 42.8 mmHg (ref 32–48)
pCO2 arterial: 38.6 mmHg (ref 32–48)
pCO2 arterial: 39.9 mmHg (ref 32–48)
pH, Arterial: 7.401 (ref 7.35–7.45)
pH, Arterial: 7.365 (ref 7.35–7.45)
pH, Arterial: 7.45 (ref 7.35–7.45)
pH, Arterial: 7.408 (ref 7.35–7.45)
pH, Arterial: 7.463 — ABNORMAL HIGH (ref 7.35–7.45)
pH, Arterial: 7.388 (ref 7.35–7.45)
pH, Arterial: 7.395 (ref 7.35–7.45)
pH, Arterial: 7.361 (ref 7.35–7.45)
pH, Arterial: 7.297 — ABNORMAL LOW (ref 7.35–7.45)
pO2, Arterial: 331 mmHg — ABNORMAL HIGH (ref 83–108)
pO2, Arterial: 433 mmHg — ABNORMAL HIGH (ref 83–108)
pO2, Arterial: 303 mmHg — ABNORMAL HIGH (ref 83–108)
pO2, Arterial: 228 mmHg — ABNORMAL HIGH (ref 83–108)
pO2, Arterial: 292 mmHg — ABNORMAL HIGH (ref 83–108)
pO2, Arterial: 285 mmHg — ABNORMAL HIGH (ref 83–108)
pO2, Arterial: 318 mmHg — ABNORMAL HIGH (ref 83–108)
pO2, Arterial: 112 mmHg — ABNORMAL HIGH (ref 83–108)
pO2, Arterial: 107 mmHg (ref 83–108)

## 2022-05-08 LAB — POCT I-STAT, CHEM 8
BUN: 23 mg/dL (ref 8–23)
BUN: 22 mg/dL (ref 8–23)
BUN: 20 mg/dL (ref 8–23)
BUN: 21 mg/dL (ref 8–23)
BUN: 22 mg/dL (ref 8–23)
Calcium, Ion: 1.24 mmol/L (ref 1.15–1.40)
Calcium, Ion: 1.22 mmol/L (ref 1.15–1.40)
Calcium, Ion: 1.04 mmol/L — ABNORMAL LOW (ref 1.15–1.40)
Calcium, Ion: 1.1 mmol/L — ABNORMAL LOW (ref 1.15–1.40)
Calcium, Ion: 1.1 mmol/L — ABNORMAL LOW (ref 1.15–1.40)
Chloride: 100 mmol/L (ref 98–111)
Chloride: 103 mmol/L (ref 98–111)
Chloride: 102 mmol/L (ref 98–111)
Chloride: 104 mmol/L (ref 98–111)
Chloride: 104 mmol/L (ref 98–111)
Creatinine, Ser: 1.2 mg/dL (ref 0.61–1.24)
Creatinine, Ser: 1.3 mg/dL — ABNORMAL HIGH (ref 0.61–1.24)
Creatinine, Ser: 1.1 mg/dL (ref 0.61–1.24)
Creatinine, Ser: 1.1 mg/dL (ref 0.61–1.24)
Creatinine, Ser: 1.1 mg/dL (ref 0.61–1.24)
Glucose, Bld: 131 mg/dL — ABNORMAL HIGH (ref 70–99)
Glucose, Bld: 133 mg/dL — ABNORMAL HIGH (ref 70–99)
Glucose, Bld: 113 mg/dL — ABNORMAL HIGH (ref 70–99)
Glucose, Bld: 147 mg/dL — ABNORMAL HIGH (ref 70–99)
Glucose, Bld: 162 mg/dL — ABNORMAL HIGH (ref 70–99)
HCT: 43 % (ref 39.0–52.0)
HCT: 39 % (ref 39.0–52.0)
HCT: 28 % — ABNORMAL LOW (ref 39.0–52.0)
HCT: 30 % — ABNORMAL LOW (ref 39.0–52.0)
HCT: 30 % — ABNORMAL LOW (ref 39.0–52.0)
Hemoglobin: 14.6 g/dL (ref 13.0–17.0)
Hemoglobin: 13.3 g/dL (ref 13.0–17.0)
Hemoglobin: 9.5 g/dL — ABNORMAL LOW (ref 13.0–17.0)
Hemoglobin: 10.2 g/dL — ABNORMAL LOW (ref 13.0–17.0)
Hemoglobin: 10.2 g/dL — ABNORMAL LOW (ref 13.0–17.0)
Potassium: 3.7 mmol/L (ref 3.5–5.1)
Potassium: 3.9 mmol/L (ref 3.5–5.1)
Potassium: 4.9 mmol/L (ref 3.5–5.1)
Potassium: 4.9 mmol/L (ref 3.5–5.1)
Potassium: 4 mmol/L (ref 3.5–5.1)
Sodium: 143 mmol/L (ref 135–145)
Sodium: 141 mmol/L (ref 135–145)
Sodium: 139 mmol/L (ref 135–145)
Sodium: 141 mmol/L (ref 135–145)
Sodium: 142 mmol/L (ref 135–145)
TCO2: 27 mmol/L (ref 22–32)
TCO2: 26 mmol/L (ref 22–32)
TCO2: 25 mmol/L (ref 22–32)
TCO2: 24 mmol/L (ref 22–32)
TCO2: 25 mmol/L (ref 22–32)

## 2022-05-08 LAB — CBC
HCT: 34.4 % — ABNORMAL LOW (ref 39.0–52.0)
HCT: 32.4 % — ABNORMAL LOW (ref 39.0–52.0)
Hemoglobin: 11.5 g/dL — ABNORMAL LOW (ref 13.0–17.0)
Hemoglobin: 10.9 g/dL — ABNORMAL LOW (ref 13.0–17.0)
MCH: 30.7 pg (ref 26.0–34.0)
MCH: 30.7 pg (ref 26.0–34.0)
MCHC: 33.4 g/dL (ref 30.0–36.0)
MCHC: 33.6 g/dL (ref 30.0–36.0)
MCV: 91.7 fL (ref 80.0–100.0)
MCV: 91.3 fL (ref 80.0–100.0)
Platelets: 101 10*3/uL — ABNORMAL LOW (ref 150–400)
Platelets: 90 10*3/uL — ABNORMAL LOW (ref 150–400)
RBC: 3.75 MIL/uL — ABNORMAL LOW (ref 4.22–5.81)
RBC: 3.55 MIL/uL — ABNORMAL LOW (ref 4.22–5.81)
RDW: 13.5 % (ref 11.5–15.5)
RDW: 13.6 % (ref 11.5–15.5)
WBC: 13.1 10*3/uL — ABNORMAL HIGH (ref 4.0–10.5)
WBC: 11.4 10*3/uL — ABNORMAL HIGH (ref 4.0–10.5)
nRBC: 0 % (ref 0.0–0.2)
nRBC: 0 % (ref 0.0–0.2)

## 2022-05-08 LAB — GLUCOSE, CAPILLARY
Glucose-Capillary: 137 mg/dL — ABNORMAL HIGH (ref 70–99)
Glucose-Capillary: 172 mg/dL — ABNORMAL HIGH (ref 70–99)
Glucose-Capillary: 158 mg/dL — ABNORMAL HIGH (ref 70–99)
Glucose-Capillary: 161 mg/dL — ABNORMAL HIGH (ref 70–99)
Glucose-Capillary: 141 mg/dL — ABNORMAL HIGH (ref 70–99)
Glucose-Capillary: 171 mg/dL — ABNORMAL HIGH (ref 70–99)
Glucose-Capillary: 184 mg/dL — ABNORMAL HIGH (ref 70–99)
Glucose-Capillary: 163 mg/dL — ABNORMAL HIGH (ref 70–99)

## 2022-05-08 LAB — POCT I-STAT EG7
Acid-Base Excess: 1 mmol/L (ref 0.0–2.0)
Bicarbonate: 26.8 mmol/L (ref 20.0–28.0)
Calcium, Ion: 1.12 mmol/L — ABNORMAL LOW (ref 1.15–1.40)
HCT: 31 % — ABNORMAL LOW (ref 39.0–52.0)
Hemoglobin: 10.5 g/dL — ABNORMAL LOW (ref 13.0–17.0)
O2 Saturation: 85 %
Potassium: 3.4 mmol/L — ABNORMAL LOW (ref 3.5–5.1)
Sodium: 141 mmol/L (ref 135–145)
TCO2: 28 mmol/L (ref 22–32)
pCO2, Ven: 49.7 mmHg (ref 44–60)
pH, Ven: 7.339 (ref 7.25–7.43)
pO2, Ven: 53 mmHg — ABNORMAL HIGH (ref 32–45)

## 2022-05-08 LAB — HEMOGLOBIN AND HEMATOCRIT, BLOOD
HCT: 31 % — ABNORMAL LOW (ref 39.0–52.0)
Hemoglobin: 10.8 g/dL — ABNORMAL LOW (ref 13.0–17.0)

## 2022-05-08 LAB — BASIC METABOLIC PANEL
Anion gap: 10 (ref 5–15)
BUN: 20 mg/dL (ref 8–23)
CO2: 19 mmol/L — ABNORMAL LOW (ref 22–32)
Calcium: 7.6 mg/dL — ABNORMAL LOW (ref 8.9–10.3)
Chloride: 111 mmol/L (ref 98–111)
Creatinine, Ser: 1.4 mg/dL — ABNORMAL HIGH (ref 0.61–1.24)
GFR, Estimated: 50 mL/min — ABNORMAL LOW (ref >60)
Glucose, Bld: 195 mg/dL — ABNORMAL HIGH (ref 70–99)
Potassium: 4.2 mmol/L (ref 3.5–5.1)
Sodium: 140 mmol/L (ref 135–145)

## 2022-05-08 LAB — PROTIME-INR
INR: 1.5 — ABNORMAL HIGH (ref 0.8–1.2)
Prothrombin Time: 18.4 seconds — ABNORMAL HIGH (ref 11.4–15.2)

## 2022-05-08 LAB — MAGNESIUM: Magnesium: 2.5 mg/dL — ABNORMAL HIGH (ref 1.7–2.4)

## 2022-05-08 LAB — APTT: aPTT: 32 seconds (ref 24–36)

## 2022-05-08 LAB — PLATELET COUNT: Platelets: 102 10*3/uL — ABNORMAL LOW (ref 150–400)

## 2022-05-08 SURGERY — CORONARY ARTERY BYPASS GRAFTING (CABG)
Anesthesia: General | Site: Chest

## 2022-05-08 MED ORDER — ACETAMINOPHEN 160 MG/5ML PO SOLN: 650.0000 mg | Freq: Once | ORAL | Status: AC

## 2022-05-08 MED ORDER — MAGNESIUM SULFATE 4 GM/100ML IV SOLN
4.0000 g | Freq: Once | INTRAVENOUS | Status: AC
  Administered 2022-05-08: 4 g via INTRAVENOUS
  Filled 2022-05-08: qty 100

## 2022-05-08 MED ORDER — FENTANYL CITRATE (PF) 250 MCG/5ML IJ SOLN
INTRAMUSCULAR | Status: AC
  Filled 2022-05-08: qty 5

## 2022-05-08 MED ORDER — BISACODYL 10 MG RE SUPP: 10.0000 mg | Freq: Every day | RECTAL | Status: DC

## 2022-05-08 MED ORDER — ASPIRIN 81 MG PO CHEW: 324.0000 mg | CHEWABLE_TABLET | Freq: Every day | ORAL | Status: DC

## 2022-05-08 MED ORDER — LACTATED RINGERS IV SOLN: INTRAVENOUS | Status: DC

## 2022-05-08 MED ORDER — DEXMEDETOMIDINE HCL IN NACL 400 MCG/100ML IV SOLN
0.0000 ug/kg/h | INTRAVENOUS | Status: DC
  Administered 2022-05-08: 0.7 ug/kg/h via INTRAVENOUS

## 2022-05-08 MED ORDER — CHLORHEXIDINE GLUCONATE 0.12 % MT SOLN
15.0000 mL | OROMUCOSAL | Status: AC
  Administered 2022-05-08: 15 mL via OROMUCOSAL
  Filled 2022-05-08: qty 15

## 2022-05-08 MED ORDER — NITROGLYCERIN IN D5W 200-5 MCG/ML-% IV SOLN: 0.0000 ug/min | INTRAVENOUS | Status: AC

## 2022-05-08 MED ORDER — PROTAMINE SULFATE 10 MG/ML IV SOLN
INTRAVENOUS | Status: DC | PRN
  Administered 2022-05-08: 370 mg via INTRAVENOUS

## 2022-05-08 MED ORDER — LACTATED RINGERS IV SOLN
500.0000 mL | Freq: Once | INTRAVENOUS | Status: AC | PRN
  Administered 2022-05-08: 500 mL via INTRAVENOUS

## 2022-05-08 MED ORDER — ASPIRIN 325 MG PO TBEC
325.0000 mg | DELAYED_RELEASE_TABLET | Freq: Every day | ORAL | Status: DC
  Administered 2022-05-09 – 2022-05-12 (×4): 325 mg via ORAL
  Filled 2022-05-08 (×4): qty 1

## 2022-05-08 MED ORDER — METOPROLOL TARTRATE 12.5 MG HALF TABLET: 12.5000 mg | ORAL_TABLET | Freq: Two times a day (BID) | ORAL | Status: DC

## 2022-05-08 MED ORDER — MIDAZOLAM HCL (PF) 10 MG/2ML IJ SOLN
INTRAMUSCULAR | Status: AC
  Filled 2022-05-08: qty 2

## 2022-05-08 MED ORDER — MIDAZOLAM HCL (PF) 5 MG/ML IJ SOLN
INTRAMUSCULAR | Status: DC | PRN
  Administered 2022-05-08: 4 mg via INTRAVENOUS
  Administered 2022-05-08: 1 mg via INTRAVENOUS
  Administered 2022-05-08: 5 mg via INTRAVENOUS

## 2022-05-08 MED ORDER — ARTIFICIAL TEARS OPHTHALMIC OINT
TOPICAL_OINTMENT | OPHTHALMIC | Status: DC | PRN
  Administered 2022-05-08: 1 via OPHTHALMIC

## 2022-05-08 MED ORDER — SODIUM CHLORIDE 0.9% FLUSH: 10.0000 mL | INTRAVENOUS | Status: DC | PRN

## 2022-05-08 MED ORDER — SUCCINYLCHOLINE CHLORIDE 200 MG/10ML IV SOSY
PREFILLED_SYRINGE | INTRAVENOUS | Status: DC | PRN
  Administered 2022-05-08: 100 mg via INTRAVENOUS

## 2022-05-08 MED ORDER — MILRINONE LACTATE IN DEXTROSE 20-5 MG/100ML-% IV SOLN
0.2500 ug/kg/min | INTRAVENOUS | Status: DC
  Administered 2022-05-08: 0.25 ug/kg/min via INTRAVENOUS
  Filled 2022-05-08: qty 100

## 2022-05-08 MED ORDER — SODIUM CHLORIDE 0.45 % IV SOLN: INTRAVENOUS | Status: DC | PRN

## 2022-05-08 MED ORDER — SODIUM CHLORIDE 0.9% FLUSH
3.0000 mL | INTRAVENOUS | Status: DC | PRN
Start: 2022-05-09 — End: 2022-05-13

## 2022-05-08 MED ORDER — LACTATED RINGERS IV SOLN: INTRAVENOUS | Status: DC | PRN

## 2022-05-08 MED ORDER — PANTOPRAZOLE SODIUM 40 MG PO TBEC
40.0000 mg | DELAYED_RELEASE_TABLET | Freq: Every day | ORAL | Status: DC
  Administered 2022-05-10 – 2022-05-14 (×5): 40 mg via ORAL
  Filled 2022-05-08 (×5): qty 1

## 2022-05-08 MED ORDER — EPINEPHRINE HCL 5 MG/250ML IV SOLN IN NS: 2.0000 ug/min | INTRAVENOUS | Status: DC

## 2022-05-08 MED ORDER — ALBUMIN HUMAN 5 % IV SOLN: INTRAVENOUS | Status: DC | PRN

## 2022-05-08 MED ORDER — FAMOTIDINE IN NACL 20-0.9 MG/50ML-% IV SOLN
20.0000 mg | Freq: Two times a day (BID) | INTRAVENOUS | Status: AC
  Administered 2022-05-08 (×2): 20 mg via INTRAVENOUS
  Filled 2022-05-08 (×2): qty 50

## 2022-05-08 MED ORDER — TRAMADOL HCL 50 MG PO TABS: 50.0000 mg | ORAL_TABLET | ORAL | Status: DC | PRN

## 2022-05-08 MED ORDER — CHLORHEXIDINE GLUCONATE 4 % EX LIQD: 30.0000 mL | CUTANEOUS | Status: DC

## 2022-05-08 MED ORDER — SODIUM BICARBONATE 8.4 % IV SOLN
100.0000 meq | Freq: Once | INTRAVENOUS | Status: AC
  Administered 2022-05-08: 100 meq via INTRAVENOUS

## 2022-05-08 MED ORDER — PLASMA-LYTE A IV SOLN
INTRAVENOUS | Status: DC | PRN
  Administered 2022-05-08: 500 mL

## 2022-05-08 MED ORDER — CHLORHEXIDINE GLUCONATE CLOTH 2 % EX PADS
6.0000 | MEDICATED_PAD | Freq: Every day | CUTANEOUS | Status: DC
Start: 2022-05-08 — End: 2022-05-14
  Administered 2022-05-08 – 2022-05-14 (×6): 6 via TOPICAL

## 2022-05-08 MED ORDER — ACETAMINOPHEN 500 MG PO TABS
1000.0000 mg | ORAL_TABLET | Freq: Once | ORAL | Status: AC
  Administered 2022-05-08: 1000 mg via ORAL
  Filled 2022-05-08: qty 2

## 2022-05-08 MED ORDER — ROCURONIUM BROMIDE 10 MG/ML (PF) SYRINGE
PREFILLED_SYRINGE | INTRAVENOUS | Status: DC | PRN
  Administered 2022-05-08: 100 mg via INTRAVENOUS
  Administered 2022-05-08: 30 mg via INTRAVENOUS
  Administered 2022-05-08: 70 mg via INTRAVENOUS

## 2022-05-08 MED ORDER — DEXTROSE 50 % IV SOLN: 0.0000 mL | INTRAVENOUS | Status: DC | PRN

## 2022-05-08 MED ORDER — HEMOSTATIC AGENTS (NO CHARGE) OPTIME
TOPICAL | Status: DC | PRN
  Administered 2022-05-08: 1 via TOPICAL

## 2022-05-08 MED ORDER — SODIUM CHLORIDE 0.9% FLUSH
10.0000 mL | Freq: Two times a day (BID) | INTRAVENOUS | Status: DC
  Administered 2022-05-08: 10 mL
  Administered 2022-05-09: 20 mL
  Administered 2022-05-09 – 2022-05-11 (×4): 10 mL
  Administered 2022-05-11: 40 mL
  Administered 2022-05-12 – 2022-05-13 (×2): 10 mL

## 2022-05-08 MED ORDER — BISACODYL 5 MG PO TBEC
10.0000 mg | DELAYED_RELEASE_TABLET | Freq: Every day | ORAL | Status: DC
  Administered 2022-05-09 – 2022-05-13 (×4): 10 mg via ORAL
  Filled 2022-05-08 (×5): qty 2

## 2022-05-08 MED ORDER — SODIUM CHLORIDE (PF) 0.9 % IJ SOLN
OROMUCOSAL | Status: DC | PRN
  Administered 2022-05-08 (×4): 4 mL via TOPICAL

## 2022-05-08 MED ORDER — CEFAZOLIN SODIUM-DEXTROSE 2-4 GM/100ML-% IV SOLN
2.0000 g | Freq: Three times a day (TID) | INTRAVENOUS | Status: AC
  Administered 2022-05-08 – 2022-05-10 (×6): 2 g via INTRAVENOUS
  Filled 2022-05-08 (×6): qty 100

## 2022-05-08 MED ORDER — FENTANYL CITRATE (PF) 250 MCG/5ML IJ SOLN
INTRAMUSCULAR | Status: DC | PRN
  Administered 2022-05-08: 150 ug via INTRAVENOUS
  Administered 2022-05-08: 50 ug via INTRAVENOUS
  Administered 2022-05-08 (×3): 100 ug via INTRAVENOUS
  Administered 2022-05-08: 200 ug via INTRAVENOUS
  Administered 2022-05-08 (×8): 100 ug via INTRAVENOUS

## 2022-05-08 MED ORDER — CHLORHEXIDINE GLUCONATE 0.12 % MT SOLN
15.0000 mL | Freq: Once | OROMUCOSAL | Status: AC
  Administered 2022-05-08: 15 mL via OROMUCOSAL
  Filled 2022-05-08: qty 15

## 2022-05-08 MED ORDER — NITROGLYCERIN 0.2 MG/ML ON CALL CATH LAB
INTRAVENOUS | Status: DC | PRN
  Administered 2022-05-08: 20 ug via INTRAVENOUS

## 2022-05-08 MED ORDER — SODIUM CHLORIDE 0.9 % IV SOLN: INTRAVENOUS | Status: DC | PRN

## 2022-05-08 MED ORDER — METOPROLOL TARTRATE 5 MG/5ML IV SOLN: 2.5000 mg | INTRAVENOUS | Status: DC | PRN

## 2022-05-08 MED ORDER — EPHEDRINE 5 MG/ML INJ
INTRAVENOUS | Status: AC
  Filled 2022-05-08: qty 5

## 2022-05-08 MED ORDER — ONDANSETRON HCL 4 MG/2ML IJ SOLN
4.0000 mg | Freq: Four times a day (QID) | INTRAMUSCULAR | Status: DC | PRN
Start: 2022-05-08 — End: 2022-05-14
  Administered 2022-05-09 – 2022-05-11 (×4): 4 mg via INTRAVENOUS
  Filled 2022-05-08 (×4): qty 2

## 2022-05-08 MED ORDER — POTASSIUM CHLORIDE 10 MEQ/50ML IV SOLN
10.0000 meq | INTRAVENOUS | Status: AC
  Administered 2022-05-08 (×2): 10 meq via INTRAVENOUS
  Filled 2022-05-08: qty 50

## 2022-05-08 MED ORDER — PROPOFOL 10 MG/ML IV BOLUS
INTRAVENOUS | Status: AC
  Filled 2022-05-08: qty 20

## 2022-05-08 MED ORDER — LIDOCAINE 2% (20 MG/ML) 5 ML SYRINGE
INTRAMUSCULAR | Status: DC | PRN
  Administered 2022-05-08: 60 mg via INTRAVENOUS

## 2022-05-08 MED ORDER — METOPROLOL TARTRATE 12.5 MG HALF TABLET
12.5000 mg | ORAL_TABLET | Freq: Once | ORAL | Status: AC
  Administered 2022-05-08: 12.5 mg via ORAL
  Filled 2022-05-08: qty 1

## 2022-05-08 MED ORDER — ATORVASTATIN CALCIUM 40 MG PO TABS
40.0000 mg | ORAL_TABLET | Freq: Every day | ORAL | Status: DC
  Administered 2022-05-09 – 2022-05-14 (×6): 40 mg via ORAL
  Filled 2022-05-08 (×6): qty 1

## 2022-05-08 MED ORDER — ACETAMINOPHEN 500 MG PO TABS
1000.0000 mg | ORAL_TABLET | Freq: Four times a day (QID) | ORAL | Status: DC
  Administered 2022-05-08 – 2022-05-12 (×6): 1000 mg via ORAL
  Filled 2022-05-08 (×7): qty 2

## 2022-05-08 MED ORDER — ~~LOC~~ CARDIAC SURGERY, PATIENT & FAMILY EDUCATION
Freq: Once | Status: DC
  Filled 2022-05-08: qty 1

## 2022-05-08 MED ORDER — INSULIN REGULAR(HUMAN) IN NACL 100-0.9 UT/100ML-% IV SOLN
INTRAVENOUS | Status: DC
Start: 2022-05-08 — End: 2022-05-12
  Administered 2022-05-08: 2.2 [IU]/h via INTRAVENOUS

## 2022-05-08 MED ORDER — PHENYLEPHRINE 80 MCG/ML (10ML) SYRINGE FOR IV PUSH (FOR BLOOD PRESSURE SUPPORT)
PREFILLED_SYRINGE | INTRAVENOUS | Status: AC
  Filled 2022-05-08: qty 10

## 2022-05-08 MED ORDER — ORAL CARE MOUTH RINSE
15.0000 mL | OROMUCOSAL | Status: DC
  Administered 2022-05-08 – 2022-05-09 (×9): 15 mL via OROMUCOSAL

## 2022-05-08 MED ORDER — VANCOMYCIN HCL IN DEXTROSE 1-5 GM/200ML-% IV SOLN
1000.0000 mg | Freq: Once | INTRAVENOUS | Status: AC
  Administered 2022-05-08: 1000 mg via INTRAVENOUS
  Filled 2022-05-08: qty 200

## 2022-05-08 MED ORDER — OXYCODONE HCL 5 MG PO TABS
5.0000 mg | ORAL_TABLET | ORAL | Status: DC | PRN
  Administered 2022-05-08 – 2022-05-09 (×2): 10 mg via ORAL
  Administered 2022-05-10 (×3): 5 mg via ORAL
  Filled 2022-05-08: qty 1
  Filled 2022-05-08: qty 2
  Filled 2022-05-08: qty 1
  Filled 2022-05-08: qty 2
  Filled 2022-05-08 (×2): qty 1

## 2022-05-08 MED ORDER — ACETAMINOPHEN 160 MG/5ML PO SOLN: 1000.0000 mg | Freq: Four times a day (QID) | ORAL | Status: DC

## 2022-05-08 MED ORDER — SODIUM CHLORIDE 0.9 % IV SOLN: INTRAVENOUS | Status: DC

## 2022-05-08 MED ORDER — ALBUMIN HUMAN 5 % IV SOLN
12.5000 g | INTRAVENOUS | Status: DC | PRN
  Administered 2022-05-08: 12.5 g via INTRAVENOUS
  Filled 2022-05-08 (×2): qty 250

## 2022-05-08 MED ORDER — DOCUSATE SODIUM 100 MG PO CAPS
200.0000 mg | ORAL_CAPSULE | Freq: Every day | ORAL | Status: DC
  Administered 2022-05-09 – 2022-05-11 (×3): 200 mg via ORAL
  Filled 2022-05-08 (×4): qty 2

## 2022-05-08 MED ORDER — METOPROLOL TARTRATE 25 MG/10 ML ORAL SUSPENSION: 12.5000 mg | Freq: Two times a day (BID) | ORAL | Status: DC

## 2022-05-08 MED ORDER — EPHEDRINE SULFATE-NACL 50-0.9 MG/10ML-% IV SOSY
PREFILLED_SYRINGE | INTRAVENOUS | Status: DC | PRN
  Administered 2022-05-08 (×2): 5 mg via INTRAVENOUS

## 2022-05-08 MED ORDER — PROPOFOL 10 MG/ML IV BOLUS
INTRAVENOUS | Status: DC | PRN
  Administered 2022-05-08: 120 mg via INTRAVENOUS

## 2022-05-08 MED ORDER — MIDAZOLAM HCL 2 MG/2ML IJ SOLN
2.0000 mg | INTRAMUSCULAR | Status: DC | PRN
Start: 2022-05-08 — End: 2022-05-13

## 2022-05-08 MED ORDER — SODIUM CHLORIDE 0.9 % IV SOLN
250.0000 mL | INTRAVENOUS | Status: DC
  Administered 2022-05-09: 250 mL via INTRAVENOUS

## 2022-05-08 MED ORDER — ACETAMINOPHEN 650 MG RE SUPP
650.0000 mg | Freq: Once | RECTAL | Status: AC
  Administered 2022-05-08: 650 mg via RECTAL

## 2022-05-08 MED ORDER — PHENYLEPHRINE HCL-NACL 20-0.9 MG/250ML-% IV SOLN: 0.0000 ug/min | INTRAVENOUS | Status: DC

## 2022-05-08 MED ORDER — SODIUM CHLORIDE 0.9% FLUSH
3.0000 mL | Freq: Two times a day (BID) | INTRAVENOUS | Status: DC
  Administered 2022-05-09 – 2022-05-12 (×8): 3 mL via INTRAVENOUS

## 2022-05-08 MED ORDER — POTASSIUM CHLORIDE 10 MEQ/50ML IV SOLN
10.0000 meq | INTRAVENOUS | Status: AC
  Administered 2022-05-08 (×3): 10 meq via INTRAVENOUS
  Filled 2022-05-08: qty 50

## 2022-05-08 MED ORDER — ORAL CARE MOUTH RINSE: 15.0000 mL | OROMUCOSAL | Status: DC | PRN

## 2022-05-08 MED ORDER — ROCURONIUM BROMIDE 10 MG/ML (PF) SYRINGE
PREFILLED_SYRINGE | INTRAVENOUS | Status: AC
  Filled 2022-05-08: qty 30

## 2022-05-08 MED ORDER — SODIUM CHLORIDE 0.9 % IR SOLN
Status: DC | PRN
  Administered 2022-05-08: 3000 mL

## 2022-05-08 MED ORDER — MORPHINE SULFATE (PF) 2 MG/ML IV SOLN
1.0000 mg | INTRAVENOUS | Status: DC | PRN
  Administered 2022-05-08 – 2022-05-09 (×4): 2 mg via INTRAVENOUS
  Filled 2022-05-08 (×2): qty 1
  Filled 2022-05-08: qty 2

## 2022-05-08 MED ORDER — HEPARIN SODIUM (PORCINE) 1000 UNIT/ML IJ SOLN
INTRAMUSCULAR | Status: DC | PRN
  Administered 2022-05-08: 2000 [IU] via INTRAVENOUS
  Administered 2022-05-08: 35000 [IU] via INTRAVENOUS

## 2022-05-08 MED ORDER — ALBUMIN HUMAN 5 % IV SOLN
250.0000 mL | INTRAVENOUS | Status: AC | PRN
  Administered 2022-05-08 (×4): 12.5 g via INTRAVENOUS
  Filled 2022-05-08 (×2): qty 250

## 2022-05-08 SURGICAL SUPPLY — 121 items
APPLIER CLIP 9.375 SM OPEN (CLIP)
BAG DECANTER FOR FLEXI CONT (MISCELLANEOUS) ×3 IMPLANT
BLADE CLIPPER SURG (BLADE) ×3 IMPLANT
BLADE STERNUM SYSTEM 6 (BLADE) ×3 IMPLANT
BLADE SURG 15 STRL LF DISP TIS (BLADE) IMPLANT
BLADE SURG 15 STRL SS (BLADE) ×3
BNDG ELASTIC 4X5.8 VLCR STR LF (GAUZE/BANDAGES/DRESSINGS) ×3 IMPLANT
BNDG ELASTIC 6X10 VLCR STRL LF (GAUZE/BANDAGES/DRESSINGS) IMPLANT
BNDG ELASTIC 6X5.8 VLCR STR LF (GAUZE/BANDAGES/DRESSINGS) ×3 IMPLANT
BNDG GAUZE DERMACEA FLUFF (GAUZE/BANDAGES/DRESSINGS) ×3
BNDG GAUZE DERMACEA FLUFF 4 (GAUZE/BANDAGES/DRESSINGS) IMPLANT
BNDG GAUZE ELAST 4 BULKY (GAUZE/BANDAGES/DRESSINGS) ×3 IMPLANT
CANISTER SUCT 3000ML PPV (MISCELLANEOUS) ×3 IMPLANT
CANNULA EZ GLIDE AORTIC 21FR (CANNULA) ×3 IMPLANT
CANNULA VESSEL 3MM BLUNT TIP (CANNULA) IMPLANT
CATH CPB KIT HENDRICKSON (MISCELLANEOUS) ×3 IMPLANT
CATH ROBINSON RED A/P 18FR (CATHETERS) ×3 IMPLANT
CATH THORACIC 36FR (CATHETERS) ×3 IMPLANT
CATH THORACIC 36FR RT ANG (CATHETERS) ×3 IMPLANT
CLIP APPLIE 9.375 SM OPEN (CLIP) IMPLANT
CLIP FOGARTY SPRING 6M (CLIP) IMPLANT
CLIP TI WIDE RED SMALL 24 (CLIP) IMPLANT
CLIP VESOCCLUDE MED 24/CT (CLIP) IMPLANT
CLIP VESOCCLUDE SM WIDE 24/CT (CLIP) IMPLANT
CONTAINER PROTECT SURGISLUSH (MISCELLANEOUS) ×6 IMPLANT
COVER MAYO STAND STRL (DRAPES) IMPLANT
CUFF TOURN SGL QUICK 18X4 (TOURNIQUET CUFF) IMPLANT
CUFF TOURN SGL QUICK 24 (TOURNIQUET CUFF)
CUFF TRNQT CYL 24X4X16.5-23 (TOURNIQUET CUFF) IMPLANT
DEFOGGER ANTIFOG KIT (MISCELLANEOUS) IMPLANT
DERMABOND ADVANCED (GAUZE/BANDAGES/DRESSINGS) ×6
DERMABOND ADVANCED .7 DNX12 (GAUZE/BANDAGES/DRESSINGS) IMPLANT
DRAPE CARDIOVASCULAR INCISE (DRAPES) ×3
DRAPE EXTREMITY T 121X128X90 (DISPOSABLE) ×3 IMPLANT
DRAPE HALF SHEET 40X57 (DRAPES) IMPLANT
DRAPE SRG 135X102X78XABS (DRAPES) ×3 IMPLANT
DRAPE WARM FLUID 44X44 (DRAPES) ×3 IMPLANT
DRSG COVADERM 4X14 (GAUZE/BANDAGES/DRESSINGS) ×3 IMPLANT
ELECT REM PT RETURN 9FT ADLT (ELECTROSURGICAL) ×6
ELECTRODE REM PT RTRN 9FT ADLT (ELECTROSURGICAL) ×6 IMPLANT
FELT TEFLON 1X6 (MISCELLANEOUS) ×6 IMPLANT
GAUZE 4X4 16PLY ~~LOC~~+RFID DBL (SPONGE) ×3 IMPLANT
GAUZE SPONGE 4X4 12PLY STRL (GAUZE/BANDAGES/DRESSINGS) ×6 IMPLANT
GAUZE SPONGE 4X4 12PLY STRL LF (GAUZE/BANDAGES/DRESSINGS) IMPLANT
GEL ULTRASOUND 20GR AQUASONIC (MISCELLANEOUS) IMPLANT
GLOVE BIO SURGEON STRL SZ 6.5 (GLOVE) IMPLANT
GLOVE BIO SURGEON STRL SZ7.5 (GLOVE) IMPLANT
GLOVE BIOGEL PI IND STRL 7.0 (GLOVE) IMPLANT
GLOVE BIOGEL PI IND STRL 8 (GLOVE) IMPLANT
GLOVE BIOGEL PI INDICATOR 7.0 (GLOVE) ×6
GLOVE BIOGEL PI INDICATOR 8 (GLOVE) ×3
GLOVE ECLIPSE 8.0 STRL XLNG CF (GLOVE) IMPLANT
GLOVE SS BIOGEL STRL SZ 6 (GLOVE) IMPLANT
GLOVE SUPERSENSE BIOGEL SZ 6 (GLOVE) ×12
GLOVE SURG SIGNA 7.5 PF LTX (GLOVE) ×9 IMPLANT
GOWN STRL REUS W/ TWL LRG LVL3 (GOWN DISPOSABLE) ×12 IMPLANT
GOWN STRL REUS W/ TWL XL LVL3 (GOWN DISPOSABLE) ×6 IMPLANT
GOWN STRL REUS W/TWL LRG LVL3 (GOWN DISPOSABLE) ×18
GOWN STRL REUS W/TWL XL LVL3 (GOWN DISPOSABLE) ×24
HEMOSTAT POWDER SURGIFOAM 1G (HEMOSTASIS) ×9 IMPLANT
HEMOSTAT SURGICEL 2X14 (HEMOSTASIS) ×3 IMPLANT
INSERT FOGARTY XLG (MISCELLANEOUS) IMPLANT
KIT BASIN OR (CUSTOM PROCEDURE TRAY) ×3 IMPLANT
KIT SUCTION CATH 14FR (SUCTIONS) ×6 IMPLANT
KIT TURNOVER KIT B (KITS) ×3 IMPLANT
KIT VASOVIEW HEMOPRO 2 VH 4000 (KITS) ×3 IMPLANT
MARKER GRAFT CORONARY BYPASS (MISCELLANEOUS) ×9 IMPLANT
NS IRRIG 1000ML POUR BTL (IV SOLUTION) ×15 IMPLANT
PACK E OPEN HEART (SUTURE) ×3 IMPLANT
PACK OPEN HEART (CUSTOM PROCEDURE TRAY) ×3 IMPLANT
PAD ARMBOARD 7.5X6 YLW CONV (MISCELLANEOUS) ×6 IMPLANT
PAD ELECT DEFIB RADIOL ZOLL (MISCELLANEOUS) ×3 IMPLANT
PENCIL BUTTON HOLSTER BLD 10FT (ELECTRODE) ×3 IMPLANT
PENCIL SMOKE EVACUATOR (MISCELLANEOUS) IMPLANT
POSITIONER HEAD DONUT 9IN (MISCELLANEOUS) ×3 IMPLANT
PUNCH AORTIC ROTATE 4.0MM (MISCELLANEOUS) IMPLANT
PUNCH AORTIC ROTATE 4.5MM 8IN (MISCELLANEOUS) IMPLANT
PUNCH AORTIC ROTATE 5MM 8IN (MISCELLANEOUS) IMPLANT
SET MPS 3-ND DEL (MISCELLANEOUS) IMPLANT
SHEARS HARMONIC 9CM CVD (BLADE) ×3 IMPLANT
SPONGE T-LAP 18X18 ~~LOC~~+RFID (SPONGE) ×12 IMPLANT
SPONGE T-LAP 4X18 ~~LOC~~+RFID (SPONGE) ×3 IMPLANT
SUPPORT HEART JANKE-BARRON (MISCELLANEOUS) ×3 IMPLANT
SUT BONE WAX W31G (SUTURE) ×3 IMPLANT
SUT ETHIBOND 2 0 SH (SUTURE) ×3
SUT ETHIBOND 2 0 SH 36X2 (SUTURE) IMPLANT
SUT MNCRL AB 4-0 PS2 18 (SUTURE) IMPLANT
SUT PROLENE 3 0 SH DA (SUTURE) ×3 IMPLANT
SUT PROLENE 4 0 RB 1 (SUTURE)
SUT PROLENE 4 0 SH DA (SUTURE) IMPLANT
SUT PROLENE 4-0 RB1 .5 CRCL 36 (SUTURE) IMPLANT
SUT PROLENE 6 0 C 1 30 (SUTURE) ×6 IMPLANT
SUT PROLENE 7 0 BV 1 (SUTURE) IMPLANT
SUT PROLENE 7 0 BV1 MDA (SUTURE) ×3 IMPLANT
SUT PROLENE 8 0 BV175 6 (SUTURE) IMPLANT
SUT SILK 2 0 TIES 10X30 (SUTURE) IMPLANT
SUT SILK 3 0 SH CR/8 (SUTURE) IMPLANT
SUT SILK 4 0 TIE 10X30 (SUTURE) IMPLANT
SUT STEEL 6MS V (SUTURE) ×3 IMPLANT
SUT STEEL STERNAL CCS#1 18IN (SUTURE) IMPLANT
SUT STEEL SZ 6 DBL 3X14 BALL (SUTURE) ×3 IMPLANT
SUT VIC AB 1 CTX 36 (SUTURE) ×6
SUT VIC AB 1 CTX36XBRD ANBCTR (SUTURE) ×6 IMPLANT
SUT VIC AB 2-0 CT1 27 (SUTURE) ×9
SUT VIC AB 2-0 CT1 TAPERPNT 27 (SUTURE) IMPLANT
SUT VIC AB 2-0 CTX 27 (SUTURE) IMPLANT
SUT VIC AB 3-0 SH 27 (SUTURE)
SUT VIC AB 3-0 SH 27X BRD (SUTURE) IMPLANT
SUT VIC AB 3-0 X1 27 (SUTURE) IMPLANT
SUT VICRYL 4-0 PS2 18IN ABS (SUTURE) IMPLANT
SYR 50ML SLIP (SYRINGE) IMPLANT
SYSTEM SAHARA CHEST DRAIN ATS (WOUND CARE) ×3 IMPLANT
TAPE CLOTH SURG 4X10 WHT LF (GAUZE/BANDAGES/DRESSINGS) IMPLANT
TAPE PAPER 2X10 WHT MICROPORE (GAUZE/BANDAGES/DRESSINGS) IMPLANT
TOWEL GREEN STERILE (TOWEL DISPOSABLE) ×3 IMPLANT
TOWEL GREEN STERILE FF (TOWEL DISPOSABLE) ×3 IMPLANT
TRAY FOLEY SLVR 16FR TEMP STAT (SET/KITS/TRAYS/PACK) ×3 IMPLANT
TUBE CONNECTING 20X1/4 (TUBING) IMPLANT
TUBING LAP HI FLOW INSUFFLATIO (TUBING) ×3 IMPLANT
UNDERPAD 30X36 HEAVY ABSORB (UNDERPADS AND DIAPERS) ×3 IMPLANT
WATER STERILE IRR 1000ML POUR (IV SOLUTION) ×6 IMPLANT

## 2022-05-08 NOTE — Anesthesia Procedure Notes (Signed)
Arterial Line Insertion Start/End8/17/2023 7:10 AM, 05/08/2022 7:30 AM Performed by: Rosiland Oz, CRNA  Patient location: Pre-op. Preanesthetic checklist: patient identified, IV checked, site marked, risks and benefits discussed, surgical consent, monitors and equipment checked, pre-op evaluation, timeout performed and anesthesia consent Lidocaine 1% used for infiltration and patient sedated Right, radial was placed Catheter size: 20 G Hand hygiene performed  and maximum sterile barriers used   Attempts: 4 Procedure performed without using ultrasound guided technique. Following insertion, dressing applied and Biopatch. Post procedure assessment: normal

## 2022-05-08 NOTE — Brief Op Note (Signed)
05/08/2022  7:08 AM  PATIENT:  [name] Goodman  81 y.o. male  PRE-OPERATIVE DIAGNOSIS:  CAD LMD  POST-OPERATIVE DIAGNOSIS:  * No post-op diagnosis entered *  PROCEDURE:  Procedure(s): CORONARY ARTERY BYPASS GRAFTING (CABG) TIMES FIVE USING ENDOSCOPICALLY HARVESTED RIGHT GREATER SAPHENOUS VEIN AND LEFT OPEN RADIAL HARVEST VEIN AND LEFT INTERNALLY MAMMARY ARTERY. (N/A) RADIAL ARTERY HARVEST (Left) TRANSESOPHAGEAL ECHOCARDIOGRAM (TEE) (N/A) Vein harvest time: Vein prep time:  SURGEON:  Surgeon(s) and Role:    * Loreli Slot, MD - Primary  PHYSICIAN ASSISTANT: Avis Tirone PA-C, Aloha Gell PA-C  ASSISTANTS: Jolene Schimke MD ([facility] RESIDENT)   ANESTHESIA:   general  EBL:  590 mL   BLOOD ADMINISTERED:none  DRAINS:  LEFT PLEURAL AND MEDIASTINAL CHEST DRAINS    LOCAL MEDICATIONS USED:  NONE  SPECIMEN:  No Specimen  DISPOSITION OF SPECIMEN:  N/A  COUNTS:  YES  TOURNIQUET:  * No tourniquets in log *  DICTATION: .Other Dictation: Dictation Number PENDING  PLAN OF CARE: Admit to inpatient   PATIENT DISPOSITION:  ICU - intubated and hemodynamically stable.   Delay start of Pharmacological VTE agent (>24hrs) due to surgical blood loss or risk of bleeding: yes  COMPLICATIONS: NO KNOWN

## 2022-05-08 NOTE — Hospital Course (Addendum)
  HPI: [name] Goodman is sent for consultation regarding left main and three-vessel coronary disease.  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  No prior history of cardiac disease, but does have a strong family history.  Because of his family history he had a workup by Dr. Lady Gary a couple of years ago.  An ETT was equivocal.  No additional testing was done.  He is very active playing golf 2-3 times a week and was walking at least 3 miles a day.  He recently has been noting sensation of severe fatigue after physical activity that takes about 30 minutes to resolve.  He also had an episode of chest pressure and severe fatigue while carrying some sticks up an incline.  He saw Dr. Darrold Junker.  A cardiac CT showed extensive atherosclerotic disease involving the left main and all 3 major vessels.  Cardiac catheterization on 04/16/2022 showed a 60% distal left main and severe diffuse three-vessel disease.  Ejection fraction was approximately 40%.  He was referred for coronary bypass grafting.  Dr. Dorris Fetch has evaluated the patient and all relevant studies and recommended proceeding with coronary artery surgical revascularization as his best option due to the severity of his disease and anatomical findings.  Hospital course:  Patient was admitted electively and on 05/08/2022 taken to the operating room at which time he underwent coronary artery bypass grafting x 5.  He tolerated the procedure well was taken the surgical intensive care unit in stable condition.  The patient was extubated the evening of surgery.  He initially required Neo and Milrinone which were weaned overnight.  He developed hypertension overnight and was started on NTG drip.  His chest tubes and arterial lines were removed without difficulty. He was transitioned off the Insulin drip. His pre op HGA1C was  6.3. He likely has pre diabetes. He will need further surveillance with medical doctor as an outpatient. We will provide nutrition information with his discharge paperwork.  He was put on an Amiodarone drip on 08/19.

## 2022-05-08 NOTE — Anesthesia Procedure Notes (Signed)
Central Venous Catheter Insertion Performed by: Marcene Duos, MD, anesthesiologist Start/End8/17/2023 6:55 AM, 05/08/2022 7:10 AM Patient location: Pre-op. Preanesthetic checklist: patient identified, IV checked, site marked, risks and benefits discussed, surgical consent, monitors and equipment checked, pre-op evaluation, timeout performed and anesthesia consent Hand hygiene performed  and maximum sterile barriers used  PA cath was placed.Swan type:thermodilution PA Cath depth:50 Procedure performed without using ultrasound guided technique. Attempts: 1 Patient tolerated the procedure well with no immediate complications.

## 2022-05-08 NOTE — Op Note (Signed)
NAME: [name] Goodman, [name] Goodman. MEDICAL RECORD NO: [id number] ACCOUNT NO: [id number] DATE OF BIRTH: [birth year] FACILITY: [facility] LOCATION: [facility] PHYSICIAN: Salvatore Decent. Dorris Fetch, MD  Operative Report   DATE OF PROCEDURE: 05/08/2022  PREOPERATIVE DIAGNOSIS:  Left main and three-vessel coronary artery disease.  POSTOPERATIVE DIAGNOSIS:  Left main and three-vessel coronary artery disease.  PROCEDURE PERFORMED:   1.  Median sternotomy, extracorporeal circulation,  Coronary artery bypass grafting x5  Left internal mammary artery to LAD, Saphenous vein graft to posterior descending, Saphenous vein graft to first diagonal, Sequential left radial artery graft to  obtuse marginal and posterolateral (Y graft off of diagonal vein),  2. Open left radial artery harvest. 2. Endoscopic vein harvest right thigh.  SURGEON:  Salvatore Decent. Dorris Fetch, MD  ASSISTANT:  Jolene Schimke, MD and Gershon Crane, [place]   ANESTHESIA:  General.  FINDINGS:  Transesophageal echocardiography showed ejection fraction of approximately 40% with mild to moderate aortic insufficiency and mild mitral regurgitation.  Post-bypass echo essentially unchanged.    Posterior descending poor quality, remaining targets good quality.  Left mammary and left radial good quality.  Saphenous vein large caliber, poor quality.  CLINICAL NOTE: [name] is an 81 year old gentleman with multiple cardiac risk factors, who had recent onset of chest pain and severe fatigue.  Cardiac catheterization showed left main and 3-vessel disease and he was referred for coronary artery  bypass grafting.  The indications, risks, benefits, and alternatives were discussed in detail with the patient.  He understood and accepted the risks and agreed to proceed.  OPERATIVE NOTE:  [name] was brought to the preoperative holding area on 05/08/2022.  Anesthesia placed a Swan-Ganz catheter and an arterial blood pressure monitoring line.  He was taken to the  operating room and anesthetized and intubated.  A Foley  catheter was placed.  Intravenous antibiotics were administered.  Transesophageal echocardiography was performed by Dr. Marcene Duos.  Please see his separately dictated note for full details of the procedure.  Findings were as noted above.  The chest, abdomen, legs and left arm  were prepped and draped in the usual sterile fashion.  A timeout was performed.  The conduits were harvested simultaneously. A median sternotomy was performed and the left internal mammary artery was harvested using standard technique.  Simultaneously, an incision was made in the medial aspect of the right  leg at the level of the knee.  The greater saphenous vein was harvested from the right thigh endoscopically.  An open left radial artery harvest was performed.  An incision was made over the volar aspect of the left wrist distally. A short segment of the  radial artery was mobilized. With proximal occlusion, there was a good pulse distally and a good signal in the palmar arch confirming the results of the patient's preoperative Allen's test.  The incision then was extended to just below the antecubital  fossa and the radial artery was harvested using the Harmonic scalpel.  The vein was large caliber and had multiple varicosities, though none of the varicose areas needed to be used. It was in general a poor quality vessel.  Both the radial  and mammary were good quality vessels.  After harvesting the radial artery, that incision was closed.  The arm was wrapped and then placed back to the patient's side.  After completing the vein harvest, the leg incisions were closed in standard fashion.   The remainder of the full heparin dose was given.  A sternal retractor was placed and was opened  over time.  The pericardium was opened.  The ascending aorta was inspected.  There was no significant atherosclerotic disease.  After confirming adequate anticoagulation with ACT  measurement, the aorta was  cannulated via concentric 2-0 Ethibond pledgeted pursestring sutures.  A dual-stage venous cannula was placed via a pursestring suture in right atrial appendage.  Cardiopulmonary bypass was initiated.  Flows were maintained per protocol.  The patient was  cooled to 32 degrees Celsius.  The coronary arteries were inspected and anastomotic sites were chosen.  The conduits were inspected and cut to length.  A foam pad was placed in the pericardium to insulate the heart.  A temperature probe was placed in  the myocardial septum and a cardioplegia cannula was placed in the ascending aorta.  The aorta was cross clamped.  The left ventricle was emptied via the aortic root vent.  Cardiac arrest then was achieved with a combination of cold antegrade blood cardioplegia and topical iced saline. 1.5 liters of cardioplegia was administered.  There  was a rapid diastolic arrest, but relatively slow septal cooling.  Ultimately, there was septal cooling to 11 degrees Celsius.    A reversed saphenous vein graft was placed end-to-side to the diagonal branch of the LAD.  This was a 1.5 mm good quality target.   The vein was of large caliber.  It was anastomosed end-to-side with a running 7-0 Prolene suture.  Probe passed easily.  With cardioplegia administration, there was good flow and good hemostasis.  Next, a reversed saphenous vein graft was placed end-to-side to the posterior descending branch of the right coronary.  This was a 1.5 mm vessel, which was diffusely diseased and poor quality.  The end-to-side anastomosis was performed with a running 7-0  Prolene suture.  Again, a probe did pass distally and there was good flow and good hemostasis with cardioplegia administration.  Additional cardioplegia was also administered down the aortic root.  The heart was elevated exposing the posterolateral wall.  The left radial artery was anastomosed sequentially to the OM and posterolateral with  running 8-0 Prolene sutures.  A side-to-side anastomosis was performed to OM1 and end-to-side to the  posterolateral.  A probe passed easily proximally and distally at each anastomosis.  Cardioplegia then was again administered down the vein grafts and the aortic root.  There was good backbleeding from  the radial artery.  The left internal mammary artery was brought through a window in the pericardium.  The distal end was bevelled.  It was anastomosed end-to-side to the distal LAD.  The distal LAD was a 1.5 mm good quality target.  The mammary was a 1.5 mm good quality  conduit.  The end-to-side anastomosis was performed with a running 8-0 Prolene suture.  At the completion of the mammary to LAD anastomosis, the bulldog clamp was briefly removed to inspect for hemostasis.  Rapid septal rewarming was noted.  The bulldog  clamp was replaced and the mammary pedicle was tacked to the epicardial surface of the heart with 6-0 Prolene sutures.  Again, additional cardioplegia was administered.  The vein grafts were cut to length.  The proximal vein graft anastomoses were performed to 4.5 mm punch aortotomies with running 6-0 Prolene sutures.  At the completion of the second vein graft proximal,  the patient was placed in Trendelenburg position.  Lidocaine was administered.  The aortic root was de-aired and the aortic crossclamp was removed.  The total crossclamp time was 92 minutes.  The patient required a single  defibrillation with 10 joules  and then resumed a bradycardic rhythm.  Bulldog clamps were placed proximally and distally on the vein graft to the diagonal.  A longitudinal venotomy was made.  The proximal anastomosis for the radial artery was performed to the venotomy with a running 7-0 Prolene suture.  The anastomosis was  de-aired before tying the suture. While rewarming was completed, all proximal and distal anastomoses were inspected for hemostasis.  Epicardial pacing wires were placed on the  right ventricle and right atrium.  When the patient reached a core  temperature of 37 degrees Celsius, he was weaned from cardiopulmonary bypass. He was on a milrinone infusion at 0.25 mcg per kilogram per minute and was DDD paced at 80 beats per minute at the time of separation from bypass.  Total bypass time was 149  minutes.  Post-bypass transesophageal echocardiography showed some septal dyskinesis with DDD pacing, but the ejection fraction was approximately the same as prebypass. The septal dyskinesis resolved with change to atrial pacing.  No change in the mild to moderate AI and mild MR.  A  test dose of protamine was administered and was well tolerated.  The atrial and aortic cannulae were removed.  The remainder of the protamine was administered without incident.  The chest was irrigated with warm saline.  Hemostasis was achieved.  The  pericardium was reapproximated over the ascending aorta and base of the heart with interrupted 3-0 silk sutures.  Left pleural and mediastinal chest tubes were placed through separate subcostal incisions and secured with #1 silk sutures.  The sternum was  closed with a combination of single and double heavy gauge stainless steel wires. The pectoralis fascia, subcutaneous tissue and skin were closed in standard fashion.  After closing the sternum, the patient's cardiac index transiently dropped without  other hemodynamic changes. Low-dose epinephrine infusion was initiated.  The patient then was transported from the operating room to the Surgical Intensive Care Unit, intubated and in good condition.  All sponge, needle and instrument counts were correct  at the end of the procedure.  Experienced assistance was necessary for this case due to surgical complexity.  Dr. Nicanor Alcon and Jadene Pierini, PA assisted with the procedure, assisting with the vein harvest, wound closure, exposure, retraction of delicate tissue, suctioning and  suture  management.        PAA D: 05/08/2022 6:39:38 pm T: 05/08/2022 11:17:00 pm  JOB: [id number]

## 2022-05-08 NOTE — Progress Notes (Signed)
Patient ID: [name] Goodman, male   DOB: 07-28-1941, 81 y.o.   MRN: [id number]  TCTS Evening Rounds:   Hemodynamically stable  CI = 2.3 on milrinone 0.25 and epi 2  Has started to wake up on vent.   Urine output good  CT output low  CBC    Component Value Date/Time   WBC 13.1 (H) 05/08/2022 1534   RBC 3.75 (L) 05/08/2022 1534   HGB 11.5 (L) 05/08/2022 1534   HGB 13.0 10/20/2013 0450   HCT 34.4 (L) 05/08/2022 1534   HCT 37.9 (L) 10/20/2013 0450   PLT 101 (L) 05/08/2022 1534   PLT 121 (L) 10/20/2013 0450   MCV 91.7 05/08/2022 1534   MCV 90 10/20/2013 0450   MCH 30.7 05/08/2022 1534   MCHC 33.4 05/08/2022 1534   RDW 13.5 05/08/2022 1534   RDW 13.8 10/20/2013 0450   LYMPHSABS 1.2 05/31/2021 1204   LYMPHSABS 0.8 (L) 10/20/2013 0450   MONOABS 0.5 05/31/2021 1204   MONOABS 0.5 10/20/2013 0450   EOSABS 0.1 05/31/2021 1204   EOSABS 0.2 10/20/2013 0450   BASOSABS 0.0 05/31/2021 1204   BASOSABS 0.0 10/20/2013 0450     BMET    Component Value Date/Time   NA 142 05/08/2022 1407   NA 134 (L) 09/27/2013 0551   K 4.0 05/08/2022 1407   K 3.4 (L) 09/27/2013 0551   CL 104 05/08/2022 1403   CL 103 09/27/2013 0551   CO2 29 05/07/2022 0838   CO2 24 09/27/2013 0551   GLUCOSE 162 (H) 05/08/2022 1403   GLUCOSE 108 (H) 09/27/2013 0551   BUN 22 05/08/2022 1403   BUN 37 (H) 09/27/2013 0551   CREATININE 1.10 05/08/2022 1403   CREATININE 1.51 (H) 09/27/2013 0551   CALCIUM 10.0 05/07/2022 0838   CALCIUM 8.2 (L) 09/27/2013 0551   GFRNONAA 52 (L) 05/07/2022 0838   GFRNONAA 45 (L) 09/27/2013 0551     A/P:  Stable postop course. Continue current plans

## 2022-05-08 NOTE — Interval H&P Note (Signed)
History and Physical Interval Note:  05/08/2022 7:42 AM  [name] Goodman  has presented today for surgery, with the diagnosis of CAD LMD.  The various methods of treatment have been discussed with the patient and family. After consideration of risks, benefits and other options for treatment, the patient has consented to  Procedure(s): CORONARY ARTERY BYPASS GRAFTING (CABG) (N/A) possible RADIAL ARTERY HARVEST OR BIMA (Left) TRANSESOPHAGEAL ECHOCARDIOGRAM (TEE) (N/A) as a surgical intervention.  The patient's history has been reviewed, patient examined, no change in status, stable for surgery.  I have reviewed the patient's chart and labs.  Questions were answered to the patient's satisfaction.     Loreli Slot

## 2022-05-08 NOTE — Progress Notes (Signed)
  Echocardiogram Echocardiogram Transesophageal has been performed.  Favor Kreh L Cristal Qadir 05/08/2022, 8:19 AM 

## 2022-05-08 NOTE — Anesthesia Preprocedure Evaluation (Signed)
Anesthesia Evaluation  Patient identified by MRN, date of birth, ID band Patient awake    Reviewed: Allergy & Precautions, NPO status , Patient's Chart, lab work & pertinent test results  Airway Mallampati: II  TM Distance: >3 FB Neck ROM: Full    Dental   Pulmonary former smoker,    breath sounds clear to auscultation       Cardiovascular hypertension, Pt. on medications + CAD and +CHF   Rhythm:Regular Rate:Normal     Neuro/Psych negative neurological ROS     GI/Hepatic negative GI ROS, Neg liver ROS,   Endo/Other  negative endocrine ROS  Renal/GU Renal InsufficiencyRenal disease     Musculoskeletal  (+) Arthritis ,   Abdominal   Peds  Hematology negative hematology ROS (+)   Anesthesia Other Findings   Reproductive/Obstetrics                             Lab Results  Component Value Date   WBC 6.4 05/07/2022   HGB 16.5 05/07/2022   HCT 50.6 05/07/2022   MCV 91.3 05/07/2022   PLT 157 05/07/2022   Lab Results  Component Value Date   CREATININE 1.36 (H) 05/07/2022   BUN 19 05/07/2022   NA 143 05/07/2022   K 4.4 05/07/2022   CL 105 05/07/2022   CO2 29 05/07/2022    Anesthesia Physical Anesthesia Plan  ASA: 4  Anesthesia Plan: General   Post-op Pain Management: Tylenol PO (pre-op)*   Induction: Intravenous  PONV Risk Score and Plan: 2 and Dexamethasone, Ondansetron and Treatment may vary due to age or medical condition  Airway Management Planned: Oral ETT  Additional Equipment: Arterial line, CVP, PA Cath, TEE and Ultrasound Guidance Line Placement  Intra-op Plan:   Post-operative Plan: Post-operative intubation/ventilation  Informed Consent: I have reviewed the patients History and Physical, chart, labs and discussed the procedure including the risks, benefits and alternatives for the proposed anesthesia with the patient or authorized representative who has  indicated his/her understanding and acceptance.     Dental advisory given  Plan Discussed with: CRNA  Anesthesia Plan Comments:         Anesthesia Quick Evaluation

## 2022-05-08 NOTE — Transfer of Care (Signed)
Immediate Anesthesia Transfer of Care Note  Patient: Ayvin Lipinski Grams  Procedure(s) Performed: CORONARY ARTERY BYPASS GRAFTING (CABG) TIMES FIVE USING ENDOSCOPICALLY HARVESTED RIGHT GREATER SAPHENOUS VEIN AND LEFT OPEN RADIAL HARVEST VEIN AND LEFT INTERNALLY MAMMARY ARTERY. (Chest) RADIAL ARTERY HARVEST (Left: Arm Lower) TRANSESOPHAGEAL ECHOCARDIOGRAM (TEE)  Patient Location: ICU  Anesthesia Type:General  Level of Consciousness: drowsy, patient cooperative and Patient remains intubated per anesthesia plan  Airway & Oxygen Therapy: Patient remains intubated per anesthesia plan and Patient placed on Ventilator (see vital sign flow sheet for setting)  Post-op Assessment: Report given to RN and Post -op Vital signs reviewed and stable  Post vital signs: Reviewed and stable  Last Vitals:  Vitals Value Taken Time  BP    Temp    Pulse 73 05/08/22 1524  Resp 15 05/08/22 1524  SpO2 95 % 05/08/22 1524  Vitals shown include unvalidated device data.  Last Pain:  Vitals:   05/08/22 0634  TempSrc:   PainSc: 0-No pain      Patients Stated Pain Goal: 2 (05/08/22 4388)  Complications: No notable events documented.

## 2022-05-08 NOTE — Procedures (Signed)
Extubation Procedure Note  Patient Details:   Name: [name] Goodman DOB: 05-Jul-1941 MRN: [id number]   Airway Documentation:    Vent end date: 05/08/22 Vent end time: 2304   Evaluation  O2 sats: stable throughout Complications: No apparent complications Patient did tolerate procedure well. Bilateral Breath Sounds: Clear   Yes Pt was suctioned orally and via ETT. Pt performed a NIF of -25 and a vital capacity of 1.0L.RN at bedside. Audible cuff leak was present. Extubated to [facility]. No stridor noted.  Hillis Range 05/08/2022, 11:20 PM

## 2022-05-08 NOTE — Anesthesia Procedure Notes (Signed)
Central Venous Catheter Insertion Performed by: Marcene Duos, MD, anesthesiologist Start/End8/17/2023 6:55 AM, 05/08/2022 7:10 AM Patient location: Pre-op. Preanesthetic checklist: patient identified, IV checked, site marked, risks and benefits discussed, surgical consent, monitors and equipment checked, pre-op evaluation, timeout performed and anesthesia consent Position: Trendelenburg Lidocaine 1% used for infiltration and patient sedated Hand hygiene performed , maximum sterile barriers used  and Seldinger technique used Catheter size: 8.5 Fr Total catheter length 10. Central line and PA cath was placed.Sheath introducer Swan type:thermodilution PA Cath depth:50 Procedure performed using ultrasound guided technique. Ultrasound Notes:anatomy identified, needle tip was noted to be adjacent to the nerve/plexus identified, no ultrasound evidence of intravascular and/or intraneural injection and image(s) printed for medical record Attempts: 1 Following insertion, line sutured, dressing applied and Biopatch. Post procedure assessment: blood return through all ports, free fluid flow and no air  Patient tolerated the procedure well with no immediate complications.

## 2022-05-08 NOTE — Anesthesia Procedure Notes (Addendum)
Procedure Name: Intubation Date/Time: 05/08/2022 8:23 AM  Performed by: Lance Coon, CRNAPre-anesthesia Checklist: Patient identified, Emergency Drugs available, Suction available, Patient being monitored and Timeout performed Patient Re-evaluated:Patient Re-evaluated prior to induction Oxygen Delivery Method: Circle system utilized Preoxygenation: Pre-oxygenation with 100% oxygen Induction Type: IV induction Ventilation: Mask ventilation without difficulty and Oral airway inserted - appropriate to patient size Laryngoscope Size: Mac and 3 Grade View: Grade I Tube type: Oral Tube size: 8.0 mm Number of attempts: 1 Airway Equipment and Method: Stylet Placement Confirmation: ETT inserted through vocal cords under direct vision, positive ETCO2 and breath sounds checked- equal and bilateral Secured at: 24 cm Tube secured with: Tape Dental Injury: Teeth and Oropharynx as per pre-operative assessment  Comments: Airway per Karmen Bongo

## 2022-05-09 ENCOUNTER — Inpatient Hospital Stay (HOSPITAL_COMMUNITY): Payer: PPO

## 2022-05-09 ENCOUNTER — Encounter (HOSPITAL_COMMUNITY): Payer: Self-pay | Admitting: Thoracic Surgery (Cardiothoracic Vascular Surgery)

## 2022-05-09 LAB — BASIC METABOLIC PANEL
Anion gap: 9 (ref 5–15)
Anion gap: 9 (ref 5–15)
BUN: 20 mg/dL (ref 8–23)
BUN: 24 mg/dL — ABNORMAL HIGH (ref 8–23)
CO2: 22 mmol/L (ref 22–32)
CO2: 24 mmol/L (ref 22–32)
Calcium: 7.7 mg/dL — ABNORMAL LOW (ref 8.9–10.3)
Calcium: 8.1 mg/dL — ABNORMAL LOW (ref 8.9–10.3)
Chloride: 109 mmol/L (ref 98–111)
Chloride: 107 mmol/L (ref 98–111)
Creatinine, Ser: 1.31 mg/dL — ABNORMAL HIGH (ref 0.61–1.24)
Creatinine, Ser: 1.54 mg/dL — ABNORMAL HIGH (ref 0.61–1.24)
GFR, Estimated: 55 mL/min — ABNORMAL LOW (ref >60)
GFR, Estimated: 45 mL/min — ABNORMAL LOW (ref >60)
Glucose, Bld: 148 mg/dL — ABNORMAL HIGH (ref 70–99)
Glucose, Bld: 150 mg/dL — ABNORMAL HIGH (ref 70–99)
Potassium: 4 mmol/L (ref 3.5–5.1)
Potassium: 4.2 mmol/L (ref 3.5–5.1)
Sodium: 140 mmol/L (ref 135–145)
Sodium: 140 mmol/L (ref 135–145)

## 2022-05-09 LAB — CBC
HCT: 32.1 % — ABNORMAL LOW (ref 39.0–52.0)
HCT: 32.1 % — ABNORMAL LOW (ref 39.0–52.0)
Hemoglobin: 10.5 g/dL — ABNORMAL LOW (ref 13.0–17.0)
Hemoglobin: 10.8 g/dL — ABNORMAL LOW (ref 13.0–17.0)
MCH: 30.3 pg (ref 26.0–34.0)
MCH: 31.1 pg (ref 26.0–34.0)
MCHC: 32.7 g/dL (ref 30.0–36.0)
MCHC: 33.6 g/dL (ref 30.0–36.0)
MCV: 92.8 fL (ref 80.0–100.0)
MCV: 92.5 fL (ref 80.0–100.0)
Platelets: 83 10*3/uL — ABNORMAL LOW (ref 150–400)
Platelets: 80 10*3/uL — ABNORMAL LOW (ref 150–400)
RBC: 3.46 MIL/uL — ABNORMAL LOW (ref 4.22–5.81)
RBC: 3.47 MIL/uL — ABNORMAL LOW (ref 4.22–5.81)
RDW: 13.9 % (ref 11.5–15.5)
RDW: 14.4 % (ref 11.5–15.5)
WBC: 9.1 10*3/uL (ref 4.0–10.5)
WBC: 11.2 10*3/uL — ABNORMAL HIGH (ref 4.0–10.5)
nRBC: 0 % (ref 0.0–0.2)
nRBC: 0 % (ref 0.0–0.2)

## 2022-05-09 LAB — GLUCOSE, CAPILLARY
Glucose-Capillary: 113 mg/dL — ABNORMAL HIGH (ref 70–99)
Glucose-Capillary: 135 mg/dL — ABNORMAL HIGH (ref 70–99)
Glucose-Capillary: 136 mg/dL — ABNORMAL HIGH (ref 70–99)
Glucose-Capillary: 137 mg/dL — ABNORMAL HIGH (ref 70–99)
Glucose-Capillary: 138 mg/dL — ABNORMAL HIGH (ref 70–99)
Glucose-Capillary: 145 mg/dL — ABNORMAL HIGH (ref 70–99)
Glucose-Capillary: 146 mg/dL — ABNORMAL HIGH (ref 70–99)
Glucose-Capillary: 147 mg/dL — ABNORMAL HIGH (ref 70–99)
Glucose-Capillary: 150 mg/dL — ABNORMAL HIGH (ref 70–99)
Glucose-Capillary: 155 mg/dL — ABNORMAL HIGH (ref 70–99)
Glucose-Capillary: 159 mg/dL — ABNORMAL HIGH (ref 70–99)

## 2022-05-09 LAB — POCT I-STAT 7, (LYTES, BLD GAS, ICA,H+H)
Acid-base deficit: 2 mmol/L (ref 0.0–2.0)
Acid-base deficit: 3 mmol/L — ABNORMAL HIGH (ref 0.0–2.0)
Bicarbonate: 22.1 mmol/L (ref 20.0–28.0)
Bicarbonate: 22.8 mmol/L (ref 20.0–28.0)
Calcium, Ion: 1.08 mmol/L — ABNORMAL LOW (ref 1.15–1.40)
Calcium, Ion: 1.16 mmol/L (ref 1.15–1.40)
HCT: 28 % — ABNORMAL LOW (ref 39.0–52.0)
HCT: 29 % — ABNORMAL LOW (ref 39.0–52.0)
Hemoglobin: 9.5 g/dL — ABNORMAL LOW (ref 13.0–17.0)
Hemoglobin: 9.9 g/dL — ABNORMAL LOW (ref 13.0–17.0)
O2 Saturation: 97 %
O2 Saturation: 97 %
Potassium: 3.9 mmol/L (ref 3.5–5.1)
Potassium: 3.9 mmol/L (ref 3.5–5.1)
Sodium: 140 mmol/L (ref 135–145)
Sodium: 143 mmol/L (ref 135–145)
TCO2: 23 mmol/L (ref 22–32)
TCO2: 24 mmol/L (ref 22–32)
pCO2 arterial: 36.6 mmHg (ref 32–48)
pCO2 arterial: 40.1 mmHg (ref 32–48)
pH, Arterial: 7.362 (ref 7.35–7.45)
pH, Arterial: 7.388 (ref 7.35–7.45)
pO2, Arterial: 93 mmHg (ref 83–108)
pO2, Arterial: 98 mmHg (ref 83–108)

## 2022-05-09 LAB — ECHO INTRAOPERATIVE TEE
Height: 73 in
Weight: 3390.39 oz

## 2022-05-09 LAB — MAGNESIUM
Magnesium: 2.3 mg/dL (ref 1.7–2.4)
Magnesium: 2.3 mg/dL (ref 1.7–2.4)

## 2022-05-09 MED ORDER — INSULIN DETEMIR 100 UNIT/ML ~~LOC~~ SOLN
25.0000 [IU] | Freq: Two times a day (BID) | SUBCUTANEOUS | Status: DC
  Administered 2022-05-09 – 2022-05-10 (×4): 25 [IU] via SUBCUTANEOUS
  Filled 2022-05-09 (×8): qty 0.25

## 2022-05-09 MED ORDER — METOPROLOL TARTRATE 25 MG/10 ML ORAL SUSPENSION: 25.0000 mg | Freq: Two times a day (BID) | ORAL | Status: DC

## 2022-05-09 MED ORDER — ISOSORBIDE MONONITRATE ER 30 MG PO TB24
30.0000 mg | ORAL_TABLET | Freq: Every day | ORAL | Status: DC
  Administered 2022-05-09 – 2022-05-14 (×6): 30 mg via ORAL
  Filled 2022-05-09 (×6): qty 1

## 2022-05-09 MED ORDER — FUROSEMIDE 10 MG/ML IJ SOLN
40.0000 mg | Freq: Once | INTRAMUSCULAR | Status: AC
  Administered 2022-05-09: 40 mg via INTRAVENOUS
  Filled 2022-05-09: qty 4

## 2022-05-09 MED ORDER — METOPROLOL TARTRATE 25 MG PO TABS
25.0000 mg | ORAL_TABLET | Freq: Two times a day (BID) | ORAL | Status: DC
  Administered 2022-05-09 – 2022-05-10 (×3): 25 mg via ORAL
  Filled 2022-05-09 (×3): qty 1

## 2022-05-09 MED ORDER — ORAL CARE MOUTH RINSE
15.0000 mL | Freq: Four times a day (QID) | OROMUCOSAL | Status: DC
  Administered 2022-05-09 – 2022-05-14 (×14): 15 mL via OROMUCOSAL

## 2022-05-09 MED ORDER — INSULIN ASPART 100 UNIT/ML IJ SOLN
0.0000 [IU] | INTRAMUSCULAR | Status: DC
  Administered 2022-05-09 – 2022-05-11 (×7): 2 [IU] via SUBCUTANEOUS

## 2022-05-09 MED FILL — Lidocaine HCl Local Soln Prefilled Syringe 100 MG/5ML (2%): INTRAMUSCULAR | Qty: 10 | Status: AC

## 2022-05-09 MED FILL — Magnesium Sulfate Inj 50%: INTRAMUSCULAR | Qty: 10 | Status: AC

## 2022-05-09 MED FILL — Potassium Chloride Inj 2 mEq/ML: INTRAVENOUS | Qty: 40 | Status: AC

## 2022-05-09 MED FILL — Sodium Bicarbonate IV Soln 8.4%: INTRAVENOUS | Qty: 50 | Status: AC

## 2022-05-09 MED FILL — Mannitol IV Soln 20%: INTRAVENOUS | Qty: 500 | Status: AC

## 2022-05-09 MED FILL — Electrolyte-R (PH 7.4) Solution: INTRAVENOUS | Qty: 4000 | Status: AC

## 2022-05-09 MED FILL — Albumin, Human Inj 5%: INTRAVENOUS | Qty: 250 | Status: AC

## 2022-05-09 MED FILL — Heparin Sodium (Porcine) Inj 1000 Unit/ML: Qty: 1000 | Status: AC

## 2022-05-09 MED FILL — Sodium Chloride IV Soln 0.9%: INTRAVENOUS | Qty: 2000 | Status: AC

## 2022-05-09 MED FILL — Heparin Sodium (Porcine) Inj 1000 Unit/ML: INTRAMUSCULAR | Qty: 20 | Status: AC

## 2022-05-09 NOTE — Progress Notes (Signed)
EVENING ROUNDS NOTE :     [address]       [place] [address]             [phone]                 1 Day Post-Op Procedure(s) (LRB): CORONARY ARTERY BYPASS GRAFTING (CABG) TIMES FIVE USING ENDOSCOPICALLY HARVESTED RIGHT GREATER SAPHENOUS VEIN AND LEFT OPEN RADIAL HARVEST VEIN AND LEFT INTERNALLY MAMMARY ARTERY. (N/A) RADIAL ARTERY HARVEST (Left) TRANSESOPHAGEAL ECHOCARDIOGRAM (TEE) (N/A)   Total Length of Stay:  LOS: 1 day  Events:   No events     BP 122/67   Pulse 67   Temp 99.1 F (37.3 C)   Resp 19   Ht 6\' 1"  (1.854 m)   Wt 103.1 kg   SpO2 100%   BMI 29.99 kg/m   PAP: (20-43)/(7-26) 28/13 CO:  [3.8 L/min-4.7 L/min] 4.5 L/min CI:  [1.7 L/min/m2-2.1 L/min/m2] 2 L/min/m2  Vent Mode: SIMV;PRVC;PSV FiO2 (%):  [40 %-50 %] 40 % Set Rate:  [4 bmp-12 bmp] 4 bmp Vt Set:  [630 mL] 630 mL PEEP:  [5 cmH20] 5 cmH20 Pressure Support:  [5 cmH20-10 cmH20] 5 cmH20 Plateau Pressure:  [19 cmH20] 19 cmH20   sodium chloride 10 mL/hr at 05/09/22 1500   sodium chloride 1 mL/hr at 05/09/22 1500   sodium chloride 20 mL/hr at 05/09/22 1500   albumin human 60 mL/hr at 05/09/22 1500    ceFAZolin (ANCEF) IV Stopped (05/09/22 1452)   insulin Stopped (05/09/22 1209)   lactated ringers     lactated ringers Stopped (05/09/22 1213)   phenylephrine (NEO-SYNEPHRINE) Adult infusion Stopped (05/08/22 2026)    I/O last 3 completed shifts: In: 7627.3 [I.V.:5003; IV Piggyback:2624.2] Out: 2670 [Urine:1610; Blood:590; Chest Tube:470]      Latest Ref Rng & Units 05/09/2022    4:20 PM 05/09/2022    3:17 AM 05/09/2022   12:09 AM  CBC  WBC 4.0 - 10.5 K/uL 11.2  9.1    Hemoglobin 13.0 - 17.0 g/dL 05/11/2022  38.1  9.5   Hematocrit 39.0 - 52.0 % 32.1  32.1  28.0   Platelets 150 - 400 K/uL 80  83         Latest Ref Rng & Units 05/09/2022    4:20 PM 05/09/2022    3:17 AM 05/09/2022   12:09 AM  BMP  Glucose 70 - 99 mg/dL 05/11/2022  510    BUN 8 - 23 mg/dL 24  20    Creatinine 258  - 1.24 mg/dL 5.27  7.82    Sodium 4.23 - 145 mmol/L 140  140  140   Potassium 3.5 - 5.1 mmol/L 4.2  4.0  3.9   Chloride 98 - 111 mmol/L 107  109    CO2 22 - 32 mmol/L 24  22    Calcium 8.9 - 10.3 mg/dL 8.1  7.7      ABG    Component Value Date/Time   PHART 7.362 05/09/2022 0009   PCO2ART 40.1 05/09/2022 0009   PO2ART 98 05/09/2022 0009   HCO3 22.8 05/09/2022 0009   TCO2 24 05/09/2022 0009   ACIDBASEDEF 2.0 05/09/2022 0009   O2SAT 97 05/09/2022 0009       05/11/2022, MD 05/09/2022 5:44 PM

## 2022-05-09 NOTE — Discharge Instructions (Signed)
Discharge Instructions:  1. You may shower, please wash incisions daily with soap and water and keep dry.  If you wish to cover wounds with dressing you may do so but please keep clean and change daily.  No tub baths or swimming until incisions have completely healed.  If your incisions become red or develop any drainage please call our office at [phone]  2. No Driving until cleared by Dr. Hendrickson's office and you are no longer using narcotic pain medications  3. Monitor your weight daily.. Please use the same scale and weigh at same time... If you gain 5-10 lbs in 48 hours with associated lower extremity swelling, please contact our office at [phone]  4. Fever of 101.5 for at least 24 hours with no source, please contact our office at [phone]  5. Activity- up as tolerated, please walk at least 3 times per day.  Avoid strenuous activity, no lifting, pushing, or pulling with your arms over 8-10 lbs for a minimum of 6 weeks  6. If any questions or concerns arise, please do not hesitate to contact our office at [phone]]   ===============================================================================================  Information on my medicine - ELIQUIS (apixaban)  This medication education was reviewed with me or my healthcare representative as part of my discharge preparation.   Why was Eliquis prescribed for you? Eliquis was prescribed for you to reduce the risk of a blood clot forming that can cause a stroke if you have a medical condition called atrial fibrillation (a type of irregular heartbeat).  What do You need to know about Eliquis ? Take your Eliquis TWICE DAILY - one tablet in the morning and one tablet in the evening with or without food. If you have difficulty swallowing the tablet whole please discuss with your pharmacist how to take the medication safely.  Take Eliquis exactly as prescribed by your doctor and DO NOT stop taking Eliquis without  talking to the doctor who prescribed the medication.  Stopping may increase your risk of developing a stroke.  Refill your prescription before you run out.  After discharge, you should have regular check-up appointments with your healthcare provider that is prescribing your Eliquis.  In the future your dose may need to be changed if your kidney function or weight changes by a significant amount or as you get older.  What do you do if you miss a dose? If you miss a dose, take it as soon as you remember on the same day and resume taking twice daily.  Do not take more than one dose of ELIQUIS at the same time to make up a missed dose.  Important Safety Information A possible side effect of Eliquis is bleeding. You should call your healthcare provider right away if you experience any of the following: Bleeding from an injury or your nose that does not stop. Unusual colored urine (red or dark brown) or unusual colored stools (red or black). Unusual bruising for unknown reasons. A serious fall or if you hit your head (even if there is no bleeding).  Some medicines may interact with Eliquis and might increase your risk of bleeding or clotting while on Eliquis. To help avoid this, consult your healthcare provider or pharmacist prior to using any new prescription or non-prescription medications, including herbals, vitamins, non-steroidal anti-inflammatory drugs (NSAIDs) and supplements.  This website has more information on Eliquis (apixaban): http://www.eliquis.com/eliquis/home   

## 2022-05-09 NOTE — Discharge Summary (Signed)
[address]       [place] [address]             [phone]    Physician Discharge Summary  Patient ID: [name] Goodman MRN: [id number] DOB/AGE: Jun 17, 1941 81 y.o.  Admit date: 05/08/2022 Discharge date: 05/15/2022  Admission Diagnoses:  Patient Active Problem List   Diagnosis Date Noted   Coronary artery disease involving native coronary artery of native heart 05/01/2022   S/P TKR (total knee replacement) using cement, right 06/11/2021   Nephrolithiasis 11/24/2017   BPH without obstruction/lower urinary tract symptoms 11/24/2017   Sinus bradycardia 02/22/2017   OA (osteoarthritis) of knee 07/23/2015   Discharge Diagnoses:  Patient Active Problem List   Diagnosis Date Noted   S/P CABG x 5 05/08/2022   Coronary artery disease involving native coronary artery of native heart 05/01/2022   S/P TKR (total knee replacement) using cement, right 06/11/2021   Nephrolithiasis 11/24/2017   BPH without obstruction/lower urinary tract symptoms 11/24/2017   Sinus bradycardia 02/22/2017   OA (osteoarthritis) of knee 07/23/2015   Discharged Condition: good    HPI: [name] Goodman is sent for consultation regarding left main and three-vessel coronary disease.  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  No prior history of cardiac disease, but does have a strong family history.  Because of his family history he had a workup by Dr. Ubaldo Glassing a couple of years ago.  An ETT was equivocal.  No additional testing was done.  He is very active playing golf 2-3 times a week and was walking at least 3 miles a day.  He recently has been noting sensation of severe fatigue after physical activity that takes about 30 minutes to resolve.  He also had an episode of chest pressure and severe fatigue while carrying some sticks up an incline.  He  saw Dr. Saralyn Pilar.  A cardiac CT showed extensive atherosclerotic disease involving the left main and all 3 major vessels.  Cardiac catheterization on 04/16/2022 showed a 60% distal left main and severe diffuse three-vessel disease.  Ejection fraction was approximately 40%.  He was referred for coronary bypass grafting.  Dr. Roxan Hockey has evaluated the patient and all relevant studies and recommended proceeding with coronary artery surgical revascularization as his best option due to the severity of his disease and anatomical findings.  Hospital course:  Patient was admitted electively and on 05/08/2022 taken to the operating room at which time he underwent coronary artery bypass grafting x 5.  He tolerated the procedure well was taken the surgical intensive care unit in stable condition.  The patient was extubated the evening of surgery.  He initially required Neo and Milrinone which were weaned overnight.  He developed hypertension overnight and was started on NTG drip.  His chest tubes and arterial lines were removed without difficulty. He was transitioned off the Insulin drip. His pre op HGA1C was 6.3. He likely has pre diabetes. He will need further surveillance with medical doctor as an outpatient. We will provide nutrition information with his discharge paperwork.  He was put on an Amiodarone drip on 08/19.  This was discontinued due to patient developing significant bradycardia.  He again converted to rate controlled Atrial Fibrillation.  He was restarted on his home regimen of Cardizem.  He continued to experience in and out episodes of Atrial Fibrillation.  He was started  on Eliquis.  He was medically stable for transfer to the progressive care unit on 05/14/2022.  He converted and maintained NSR.  His creatinine level continued to trend down.  His surgical incisions were healing without evidence of infection.  He is ambulating independently.  He is surgically stable for discharge home today.     Consults: None  Significant Diagnostic Studies: angiography:     Prox RCA-1 lesion is 50% stenosed.   Prox RCA-2 lesion is 60% stenosed.   Dist RCA lesion is 80% stenosed.   RPDA-1 lesion is 70% stenosed.   RPDA-2 lesion is 75% stenosed.   Mid Cx lesion is 75% stenosed.   2nd Mrg lesion is 90% stenosed.   Ost Cx to Prox Cx lesion is 80% stenosed.   Mid LM to Dist LM lesion is 60% stenosed.   Prox LAD lesion is 70% stenosed.   Mid LAD lesion is 70% stenosed.   There is mild left ventricular systolic dysfunction.   LV end diastolic pressure is moderately elevated.   The left ventricular ejection fraction is 35-45% by visual estimate.   1.  Three-vessel and left main disease with 60 to 70% stenosis distal left main, 70% stenosis proximal and mid LAD, 80% stenosis ostial left circumflex, occluded small OM 2, distal 80% stenosis RCA, sequential 70% and 75% stenosis RPDA. 2.  Mildly reduced left ventricular function, with global hypokinesis, with estimated LV ejection fraction of 40%  Treatments: surgery:   Operative Report    DATE OF PROCEDURE: 05/08/2022   PREOPERATIVE DIAGNOSIS:  Three-vessel coronary artery disease.   POSTOPERATIVE DIAGNOSIS:  Left main and three-vessel coronary artery disease.   PROCEDURE PERFORMED:   1.  Median sternotomy, extracorporeal circulation, coronary artery bypass grafting x5 (left internal mammary artery to LAD, saphenous vein graft to posterior descending, saphenous vein graft to first diagonal, sequential left radial artery graft to  obtuse marginal and posterolateral, Y graft off of diagonal vein), open left radial artery harvest. 2. Endoscopic vein harvest, right thigh.   SURGEON:  Salvatore Decent. Dorris Fetch, MD   ASSISTANT:  Jolene Schimke, MD and Gershon Crane, [place]    Discharge Exam: Blood pressure (!) 151/88, pulse 76, temperature 98.4 F (36.9 C), temperature source Oral, resp. rate 17, height 6\' 1"  (1.854 m), weight 100.7 kg, SpO2 98  %.  General appearance: alert, cooperative, and no distress Heart: regular rate and rhythm Lungs: clear to auscultation bilaterally Abdomen: soft, non-tender; bowel sounds normal; no masses,  no organomegaly Extremities: edema trace Wound: clean and dry   Discharge Medications:  The patient has been discharged on:   1.Beta Blocker:  Yes [ X  ]                              No   [   ]                              If No, reason:  2.Ace Inhibitor/ARB: Yes [   ]                                     No  [ X   ]  If No, reason: AKI  3.Statin:   Yes [ X  ]                  No  [   ]                  If No, reason:  4.Ecasa:  Yes  [ X  ]                  No   [   ]                  If No, reason:  Patient had ACS upon admission: No  Plavix/P2Y12 inhibitor: Yes [   ]                                      No  [x  ]  Discharge Instructions     Amb Referral to Cardiac Rehabilitation   Complete by: As directed    Diagnosis: CABG   CABG X ___: 5   After initial evaluation and assessments completed: Virtual Based Care may be provided alone or in conjunction with Phase 2 Cardiac Rehab based on patient barriers.: Yes      Allergies as of 05/14/2022   No Known Allergies      Medication List     TAKE these medications    apixaban 5 MG Tabs tablet Commonly known as: ELIQUIS Take 1 tablet (5 mg total) by mouth 2 (two) times daily.   aspirin EC 81 MG tablet Take 81 mg by mouth daily.   atorvastatin 40 MG tablet Commonly known as: LIPITOR Take 1 tablet (40 mg total) by mouth daily. What changed:  medication strength how much to take   cyanocobalamin 1000 MCG tablet Commonly known as: VITAMIN B12 Take 1,000 mcg by mouth daily.   diltiazem 240 MG 24 hr capsule Commonly known as: TIAZAC Take 240 mg by mouth daily.   fosinopril 20 MG tablet Commonly known as: MONOPRIL Take 1 tablet (20 mg total) by mouth daily. Start taking on:  June 15, 2022 What changed: These instructions start on June 15, 2022. If you are unsure what to do until then, ask your doctor or other care provider.   hydrochlorothiazide 25 MG tablet Commonly known as: HYDRODIURIL Take 1 tablet (25 mg total) by mouth daily. Start taking on: June 15, 2022 What changed: These instructions start on June 15, 2022. If you are unsure what to do until then, ask your doctor or other care provider.   isosorbide mononitrate 30 MG 24 hr tablet Commonly known as: IMDUR Take 1 tablet (30 mg total) by mouth daily.   metoprolol tartrate 25 MG tablet Commonly known as: LOPRESSOR Take 1 tablet (25 mg total) by mouth 2 (two) times daily.   traMADol 50 MG tablet Commonly known as: ULTRAM Take 1 tablet (50 mg total) by mouth every 4 (four) hours as needed for moderate pain.        Follow-up Information     Loreli Slot, MD Follow up on 06/17/2022.   Specialty: Cardiothoracic Surgery Why: Appointment is at 3:45, please get CXR at 3:15 at [facility] located on first floor of our office building Contact information: [address] [place] [address] [phone]         Marcina Millard, MD Follow up on 06/05/2022.  Specialty: Cardiology Why: Appointment is at 3:30.. Appointment is with Alonna Minium information: [facility] [place] [address] [address] Follow up.   Why: Latricia Heft)- [place] referral from [place] office- they will contact you to f/u and schedule post discharge Contact information: [place] [address] [phone]                 Signed:  Ellwood Handler, PA-C  05/15/2022, 11:37 AM

## 2022-05-09 NOTE — Progress Notes (Addendum)
1 Day Post-Op Procedure(s) (LRB): CORONARY ARTERY BYPASS GRAFTING (CABG) TIMES FIVE USING ENDOSCOPICALLY HARVESTED RIGHT GREATER SAPHENOUS VEIN AND LEFT OPEN RADIAL HARVEST VEIN AND LEFT INTERNALLY MAMMARY ARTERY. (N/A) RADIAL ARTERY HARVEST (Left) TRANSESOPHAGEAL ECHOCARDIOGRAM (TEE) (N/A) Subjective: Extubated overnight to [place]. CI improved to ~3, hypertension overnight -> weaned milrinone and phenyl, on NTG 30 this morning Has already stood, dangled; CT output 470 Complains of some upper back pain Good glucose control Plts 83 (90)  Objective: Vital signs in last 24 hours: Temp:  [96.6 F (35.9 C)-99 F (37.2 C)] 98.8 F (37.1 C) (08/18 0715) Pulse Rate:  [55-81] 65 (08/18 0715) Cardiac Rhythm: Normal sinus rhythm;Bundle branch block (08/18 0000) Resp:  [10-18] 14 (08/18 0715) BP: (100-131)/(58-87) 103/66 (08/18 0600) SpO2:  [94 %-100 %] 98 % (08/18 0715) Arterial Line BP: (75-174)/(41-77) 157/67 (08/18 0715) FiO2 (%):  [40 %-50 %] 40 % (08/17 2218) Weight:  [103.1 kg] 103.1 kg (08/18 0500)  Hemodynamic parameters for last 24 hours: PAP: (20-43)/(7-26) 43/23 CO:  [3.8 L/min-5 L/min] 4.5 L/min CI:  [1.7 L/min/m2-2.3 L/min/m2] 2 L/min/m2  Intake/Output from previous day: 08/17 0701 - 08/18 0700 In: 7627.3 [I.V.:5003; IV Piggyback:2624.2] Out: 2670 [Urine:1610; Blood:590; Chest Tube:470] Intake/Output this shift: No intake/output data recorded.  General appearance: alert and cooperative Heart: regular rate and rhythm with PVCs, S1, S2 normal Lungs: clear to auscultation bilaterally Chest dressing C/D/I, CT with serosang output, no air leak. Abdomen: soft, NT, ND Extremities: left forearm incision with dressing c/d/I, left hand motor/sensory intact. Right leg incisions with skin glue c/d/I, mild bruising.  Lab Results: Recent Labs    05/08/22 2130 05/08/22 2137 05/09/22 0009 05/09/22 0317  WBC 11.4*  --   --  9.1  HGB 10.9*   < > 9.5* 10.5*  HCT 32.4*   < > 28.0*  32.1*  PLT 90*  --   --  83*   < > = values in this interval not displayed.   BMET:  Recent Labs    05/08/22 2130 05/08/22 2137 05/09/22 0009 05/09/22 0317  NA 140   < > 140 140  K 4.2   < > 3.9 4.0  CL 111  --   --  109  CO2 19*  --   --  22  GLUCOSE 195*  --   --  148*  BUN 20  --   --  20  CREATININE 1.40*  --   --  1.31*  CALCIUM 7.6*  --   --  7.7*   < > = values in this interval not displayed.    PT/INR:  Recent Labs    05/08/22 1534  LABPROT 18.4*  INR 1.5*   ABG    Component Value Date/Time   PHART 7.362 05/09/2022 0009   HCO3 22.8 05/09/2022 0009   TCO2 24 05/09/2022 0009   ACIDBASEDEF 2.0 05/09/2022 0009   O2SAT 97 05/09/2022 0009   CBG (last 3)  Recent Labs    05/09/22 0442 05/09/22 0540 05/09/22 0641  GLUCAP 145* 137* 136*    Assessment/Plan: S/P Procedure(s) (LRB): CORONARY ARTERY BYPASS GRAFTING (CABG) TIMES FIVE USING ENDOSCOPICALLY HARVESTED RIGHT GREATER SAPHENOUS VEIN AND LEFT OPEN RADIAL HARVEST VEIN AND LEFT INTERNALLY MAMMARY ARTERY. (N/A) RADIAL ARTERY HARVEST (Left) TRANSESOPHAGEAL ECHOCARDIOGRAM (TEE) (N/A) 17M POD1 s/p CABGx5, LIMA to LAD, SVG to PD, SVG to diag, sequential radial art to OM and PL grafted off diag vein. Recovering well. D/c chest tubes D/c insulin gtt -> levemir 25u BID and SS  Hold DVT ppx while thrombocytopenic D/c swan Out of bed ADAT Continue ICU-level care    LOS: 1 day    Jolene Schimke, MD MAS Resident 05/09/2022  I saw Mr. Goodenow with Dr. Lutricia Feil on the morning this note was written.  Plan as outlined above.  Salvatore Decent Dorris Fetch, MD [facility] [phone]

## 2022-05-10 ENCOUNTER — Inpatient Hospital Stay (HOSPITAL_COMMUNITY): Payer: PPO

## 2022-05-10 LAB — CBC
HCT: 33.2 % — ABNORMAL LOW (ref 39.0–52.0)
Hemoglobin: 11.1 g/dL — ABNORMAL LOW (ref 13.0–17.0)
MCH: 30.8 pg (ref 26.0–34.0)
MCHC: 33.4 g/dL (ref 30.0–36.0)
MCV: 92.2 fL (ref 80.0–100.0)
Platelets: 91 10*3/uL — ABNORMAL LOW (ref 150–400)
RBC: 3.6 MIL/uL — ABNORMAL LOW (ref 4.22–5.81)
RDW: 14.5 % (ref 11.5–15.5)
WBC: 12.9 10*3/uL — ABNORMAL HIGH (ref 4.0–10.5)
nRBC: 0 % (ref 0.0–0.2)

## 2022-05-10 LAB — BASIC METABOLIC PANEL
Anion gap: 9 (ref 5–15)
BUN: 34 mg/dL — ABNORMAL HIGH (ref 8–23)
CO2: 23 mmol/L (ref 22–32)
Calcium: 8.1 mg/dL — ABNORMAL LOW (ref 8.9–10.3)
Chloride: 106 mmol/L (ref 98–111)
Creatinine, Ser: 1.68 mg/dL — ABNORMAL HIGH (ref 0.61–1.24)
GFR, Estimated: 41 mL/min — ABNORMAL LOW (ref >60)
Glucose, Bld: 142 mg/dL — ABNORMAL HIGH (ref 70–99)
Potassium: 4.1 mmol/L (ref 3.5–5.1)
Sodium: 138 mmol/L (ref 135–145)

## 2022-05-10 LAB — GLUCOSE, CAPILLARY
Glucose-Capillary: 112 mg/dL — ABNORMAL HIGH (ref 70–99)
Glucose-Capillary: 133 mg/dL — ABNORMAL HIGH (ref 70–99)
Glucose-Capillary: 150 mg/dL — ABNORMAL HIGH (ref 70–99)
Glucose-Capillary: 120 mg/dL — ABNORMAL HIGH (ref 70–99)
Glucose-Capillary: 151 mg/dL — ABNORMAL HIGH (ref 70–99)
Glucose-Capillary: 147 mg/dL — ABNORMAL HIGH (ref 70–99)
Glucose-Capillary: 128 mg/dL — ABNORMAL HIGH (ref 70–99)

## 2022-05-10 MED ORDER — AMIODARONE LOAD VIA INFUSION
150.0000 mg | Freq: Once | INTRAVENOUS | Status: AC
  Administered 2022-05-10: 150 mg via INTRAVENOUS
  Filled 2022-05-10: qty 83.34

## 2022-05-10 MED ORDER — SODIUM CHLORIDE 0.9 % IV SOLN: 6.2500 mg | Freq: Four times a day (QID) | INTRAVENOUS | Status: DC | PRN

## 2022-05-10 MED ORDER — AMIODARONE HCL IN DEXTROSE 360-4.14 MG/200ML-% IV SOLN
60.0000 mg/h | INTRAVENOUS | Status: AC
  Administered 2022-05-10 (×2): 60 mg/h via INTRAVENOUS
  Filled 2022-05-10 (×2): qty 200

## 2022-05-10 MED ORDER — METOPROLOL TARTRATE 50 MG PO TABS
50.0000 mg | ORAL_TABLET | Freq: Two times a day (BID) | ORAL | Status: DC
  Administered 2022-05-11 – 2022-05-13 (×4): 50 mg via ORAL
  Filled 2022-05-10 (×5): qty 1

## 2022-05-10 MED ORDER — AMIODARONE HCL IN DEXTROSE 360-4.14 MG/200ML-% IV SOLN
30.0000 mg/h | INTRAVENOUS | Status: DC
  Administered 2022-05-10: 30 mg/h via INTRAVENOUS

## 2022-05-10 NOTE — Progress Notes (Signed)
Patients HR 50's on the monitor with SBP 90's. On Amio drip at 30mg /hr. Per Dr.Lightfoot stop Amio drip for now.

## 2022-05-10 NOTE — Progress Notes (Signed)
      [address]       [place] [id number]             [phone]                 2 Days Post-Op Procedure(s) (LRB): CORONARY ARTERY BYPASS GRAFTING (CABG) TIMES FIVE USING ENDOSCOPICALLY HARVESTED RIGHT GREATER SAPHENOUS VEIN AND LEFT OPEN RADIAL HARVEST VEIN AND LEFT INTERNALLY MAMMARY ARTERY. (N/A) RADIAL ARTERY HARVEST (Left) TRANSESOPHAGEAL ECHOCARDIOGRAM (TEE) (N/A)   Events: No events Not walking much Refusing pain meds _______________________________________________________________ Vitals: BP 139/84   Pulse 95   Temp (!) 96.3 F (35.7 C) (Axillary)   Resp (!) 0   Ht 6\' 1"  (1.854 m)   Wt 103.1 kg   SpO2 98%   BMI 29.98 kg/m  Filed Weights   05/08/22 0614 05/09/22 0500 05/10/22 0500  Weight: 96.1 kg 103.1 kg 103.1 kg     - Neuro: alert NAD  - Cardiovascular: sinus tach  Drips: none.      - Pulm: EWOB    ABG    Component Value Date/Time   PHART 7.362 05/09/2022 0009   PCO2ART 40.1 05/09/2022 0009   PO2ART 98 05/09/2022 0009   HCO3 22.8 05/09/2022 0009   TCO2 24 05/09/2022 0009   ACIDBASEDEF 2.0 05/09/2022 0009   O2SAT 97 05/09/2022 0009    - Abd: ND - Extremity: warm  .Intake/Output      08/18 0701 08/19 0700 08/19 0701 08/20 0700   I.V. (mL/kg) 566.6 (5.5) 185 (1.8)   IV Piggyback 200 100   Total Intake(mL/kg) 766.6 (7.4) 285 (2.8)   Urine (mL/kg/hr) 1950 (0.8)    Blood     Chest Tube     Total Output 1950    Net -1183.5 +285           _______________________________________________________________ Labs:    Latest Ref Rng & Units 05/10/2022    4:59 AM 05/09/2022    4:20 PM 05/09/2022    3:17 AM  CBC  WBC 4.0 - 10.5 K/uL 12.9  11.2  9.1   Hemoglobin 13.0 - 17.0 g/dL 05/11/2022  46.6  59.9   Hematocrit 39.0 - 52.0 % 33.2  32.1  32.1   Platelets 150 - 400 K/uL 91  80  83       Latest Ref Rng & Units 05/10/2022    4:59 AM 05/09/2022    4:20 PM 05/09/2022    3:17 AM  CMP  Glucose 70 - 99 mg/dL 05/11/2022  017  793   BUN  8 - 23 mg/dL 34  24  20   Creatinine 0.61 - 1.24 mg/dL 903  0.09  2.33   Sodium 135 - 145 mmol/L 138  140  140   Potassium 3.5 - 5.1 mmol/L 4.1  4.2  4.0   Chloride 98 - 111 mmol/L 106  107  109   CO2 22 - 32 mmol/L 23  24  22    Calcium 8.9 - 10.3 mg/dL 8.1  8.1  7.7     CXR: - small effusions  _______________________________________________________________  Assessment and Plan: POD 2 s/p CABG  Neuro: encouraged taking pain meds CV: on A/S.  Increasing BB.   Pulm: IS, ambulation Renal: creat up.  Holding diuresis GI: added phenergan Heme: stable ID: [email or web address] Endo: SSI Dispo: continue ICU care   0.07 05/10/2022 10:53 AM

## 2022-05-11 LAB — GLUCOSE, CAPILLARY
Glucose-Capillary: 113 mg/dL — ABNORMAL HIGH (ref 70–99)
Glucose-Capillary: 155 mg/dL — ABNORMAL HIGH (ref 70–99)
Glucose-Capillary: 70 mg/dL (ref 70–99)
Glucose-Capillary: 71 mg/dL (ref 70–99)
Glucose-Capillary: 76 mg/dL (ref 70–99)
Glucose-Capillary: 86 mg/dL (ref 70–99)
Glucose-Capillary: 90 mg/dL (ref 70–99)

## 2022-05-11 LAB — BASIC METABOLIC PANEL
Anion gap: 8 (ref 5–15)
BUN: 61 mg/dL — ABNORMAL HIGH (ref 8–23)
CO2: 23 mmol/L (ref 22–32)
Calcium: 8.1 mg/dL — ABNORMAL LOW (ref 8.9–10.3)
Chloride: 102 mmol/L (ref 98–111)
Creatinine, Ser: 1.89 mg/dL — ABNORMAL HIGH (ref 0.61–1.24)
GFR, Estimated: 35 mL/min — ABNORMAL LOW (ref >60)
Glucose, Bld: 97 mg/dL (ref 70–99)
Potassium: 3.7 mmol/L (ref 3.5–5.1)
Sodium: 133 mmol/L — ABNORMAL LOW (ref 135–145)

## 2022-05-11 LAB — CBC
HCT: 34.7 % — ABNORMAL LOW (ref 39.0–52.0)
Hemoglobin: 11.3 g/dL — ABNORMAL LOW (ref 13.0–17.0)
MCH: 30.3 pg (ref 26.0–34.0)
MCHC: 32.6 g/dL (ref 30.0–36.0)
MCV: 93 fL (ref 80.0–100.0)
Platelets: 101 10*3/uL — ABNORMAL LOW (ref 150–400)
RBC: 3.73 MIL/uL — ABNORMAL LOW (ref 4.22–5.81)
RDW: 14.3 % (ref 11.5–15.5)
WBC: 12.5 10*3/uL — ABNORMAL HIGH (ref 4.0–10.5)
nRBC: 0.2 % (ref 0.0–0.2)

## 2022-05-11 NOTE — Progress Notes (Signed)
EVENING ROUNDS NOTE :     [address]       [place] [address]             [phone]                 3 Days Post-Op Procedure(s) (LRB): CORONARY ARTERY BYPASS GRAFTING (CABG) TIMES FIVE USING ENDOSCOPICALLY HARVESTED RIGHT GREATER SAPHENOUS VEIN AND LEFT OPEN RADIAL HARVEST VEIN AND LEFT INTERNALLY MAMMARY ARTERY. (N/A) RADIAL ARTERY HARVEST (Left) TRANSESOPHAGEAL ECHOCARDIOGRAM (TEE) (N/A)   Total Length of Stay:  LOS: 3 days  Events:   No events Stable day    BP 133/86   Pulse 62   Temp 98.4 F (36.9 C) (Oral)   Resp 13   Ht 6\' 1"  (1.854 m)   Wt 101.3 kg   SpO2 95%   BMI 29.46 kg/m          sodium chloride Stopped (05/09/22 1732)   sodium chloride Stopped (05/11/22 0700)   sodium chloride Stopped (05/11/22 0700)   albumin human Stopped (05/11/22 0700)   amiodarone Stopped (05/10/22 2100)   insulin Stopped (05/09/22 1209)   lactated ringers     lactated ringers Stopped (05/09/22 1213)   promethazine (PHENERGAN) injection (IM or IVPB)      I/O last 3 completed shifts: In: 1308.1 [P.O.:600; I.V.:608.1; IV Piggyback:100] Out: 910 [Urine:910]      Latest Ref Rng & Units 05/11/2022    7:58 AM 05/10/2022    4:59 AM 05/09/2022    4:20 PM  CBC  WBC 4.0 - 10.5 K/uL 12.5  12.9  11.2   Hemoglobin 13.0 - 17.0 g/dL 05/11/2022  44.0  10.2   Hematocrit 39.0 - 52.0 % 34.7  33.2  32.1   Platelets 150 - 400 K/uL 101  91  80        Latest Ref Rng & Units 05/11/2022    7:58 AM 05/10/2022    4:59 AM 05/09/2022    4:20 PM  BMP  Glucose 70 - 99 mg/dL 97  05/11/2022  366   BUN 8 - 23 mg/dL 61  34  24   Creatinine 0.61 - 1.24 mg/dL 440  3.47  4.25   Sodium 135 - 145 mmol/L 133  138  140   Potassium 3.5 - 5.1 mmol/L 3.7  4.1  4.2   Chloride 98 - 111 mmol/L 102  106  107   CO2 22 - 32 mmol/L 23  23  24    Calcium 8.9 - 10.3 mg/dL 8.1  8.1  8.1     ABG    Component Value Date/Time   PHART 7.362 05/09/2022 0009   PCO2ART 40.1 05/09/2022 0009   PO2ART 98  05/09/2022 0009   HCO3 22.8 05/09/2022 0009   TCO2 24 05/09/2022 0009   ACIDBASEDEF 2.0 05/09/2022 0009   O2SAT 97 05/09/2022 0009       Detra Bores, MD 05/11/2022 7:46 PM

## 2022-05-11 NOTE — Progress Notes (Signed)
      [address]       [place] [address]             [phone]                 3 Days Post-Op Procedure(s) (LRB): CORONARY ARTERY BYPASS GRAFTING (CABG) TIMES FIVE USING ENDOSCOPICALLY HARVESTED RIGHT GREATER SAPHENOUS VEIN AND LEFT OPEN RADIAL HARVEST VEIN AND LEFT INTERNALLY MAMMARY ARTERY. (N/A) RADIAL ARTERY HARVEST (Left) TRANSESOPHAGEAL ECHOCARDIOGRAM (TEE) (N/A)   Events: No events  _______________________________________________________________ Vitals: BP 132/82   Pulse 64   Temp 97.6 F (36.4 C) (Oral)   Resp 18   Ht 6\' 1"  (1.854 m)   Wt 101.3 kg   SpO2 98%   BMI 29.46 kg/m  Filed Weights   05/09/22 0500 05/10/22 0500 05/11/22 0500  Weight: 103.1 kg 103.1 kg 101.3 kg     - Neuro: alert NAD  - Cardiovascular: sinus   Drips: none.      - Pulm: EWOB    ABG    Component Value Date/Time   PHART 7.362 05/09/2022 0009   PCO2ART 40.1 05/09/2022 0009   PO2ART 98 05/09/2022 0009   HCO3 22.8 05/09/2022 0009   TCO2 24 05/09/2022 0009   ACIDBASEDEF 2.0 05/09/2022 0009   O2SAT 97 05/09/2022 0009    - Abd: ND - Extremity: warm  .Intake/Output      08/19 0701 08/20 0700 08/20 0701 08/21 0700   P.O. 240    I.V. (mL/kg) 608.1 (6) 0 (0)   IV Piggyback 100 0   Total Intake(mL/kg) 948.1 (9.4) 0 (0)   Urine (mL/kg/hr) 550 (0.2) 110 (0.3)   Total Output 550 110   Net +398.1 -110           _______________________________________________________________ Labs:    Latest Ref Rng & Units 05/11/2022    7:58 AM 05/10/2022    4:59 AM 05/09/2022    4:20 PM  CBC  WBC 4.0 - 10.5 K/uL 12.5  12.9  11.2   Hemoglobin 13.0 - 17.0 g/dL 05/11/2022  92.4  93.2   Hematocrit 39.0 - 52.0 % 34.7  33.2  32.1   Platelets 150 - 400 K/uL 101  91  80       Latest Ref Rng & Units 05/11/2022    7:58 AM 05/10/2022    4:59 AM 05/09/2022    4:20 PM  CMP  Glucose 70 - 99 mg/dL 97  05/11/2022  914   BUN 8 - 23 mg/dL 61  34  24   Creatinine 0.61 - 1.24 mg/dL 445  8.48   3.50   Sodium 135 - 145 mmol/L 133  138  140   Potassium 3.5 - 5.1 mmol/L 3.7  4.1  4.2   Chloride 98 - 111 mmol/L 102  106  107   CO2 22 - 32 mmol/L 23  23  24    Calcium 8.9 - 10.3 mg/dL 8.1  8.1  8.1     CXR: -   _______________________________________________________________  Assessment and Plan: POD 3 s/p CABG  Neuro: pain controlled CV: on A/S/BB.  Rate much better today.     Pulm: IS, ambulation Renal: creat up.  Off diuretics.  Will continue to watch GI: on diet Heme: stable ID: [email or web address] Endo: SSI Dispo: continue ICU care   [name] Goodman 05/11/2022 10:22 AM

## 2022-05-12 LAB — GLUCOSE, CAPILLARY
Glucose-Capillary: 94 mg/dL (ref 70–99)
Glucose-Capillary: 129 mg/dL — ABNORMAL HIGH (ref 70–99)
Glucose-Capillary: 96 mg/dL (ref 70–99)
Glucose-Capillary: 105 mg/dL — ABNORMAL HIGH (ref 70–99)
Glucose-Capillary: 126 mg/dL — ABNORMAL HIGH (ref 70–99)

## 2022-05-12 LAB — BASIC METABOLIC PANEL
Anion gap: 6 (ref 5–15)
BUN: 59 mg/dL — ABNORMAL HIGH (ref 8–23)
CO2: 28 mmol/L (ref 22–32)
Calcium: 8.1 mg/dL — ABNORMAL LOW (ref 8.9–10.3)
Chloride: 104 mmol/L (ref 98–111)
Creatinine, Ser: 1.59 mg/dL — ABNORMAL HIGH (ref 0.61–1.24)
GFR, Estimated: 43 mL/min — ABNORMAL LOW (ref >60)
Glucose, Bld: 110 mg/dL — ABNORMAL HIGH (ref 70–99)
Potassium: 3.7 mmol/L (ref 3.5–5.1)
Sodium: 138 mmol/L (ref 135–145)

## 2022-05-12 MED ORDER — INSULIN ASPART 100 UNIT/ML IJ SOLN
0.0000 [IU] | Freq: Three times a day (TID) | INTRAMUSCULAR | Status: DC
  Administered 2022-05-13 – 2022-05-14 (×4): 2 [IU] via SUBCUTANEOUS
  Administered 2022-05-14: 3 [IU] via SUBCUTANEOUS

## 2022-05-12 MED ORDER — DILTIAZEM HCL ER COATED BEADS 120 MG PO CP24
120.0000 mg | ORAL_CAPSULE | Freq: Every day | ORAL | Status: DC
Start: 2022-05-12 — End: 2022-05-13
  Administered 2022-05-12: 120 mg via ORAL
  Filled 2022-05-12 (×2): qty 1

## 2022-05-12 MED ORDER — AMIODARONE HCL 200 MG PO TABS: 400.0000 mg | ORAL_TABLET | Freq: Two times a day (BID) | ORAL | Status: DC

## 2022-05-12 MED ORDER — ENOXAPARIN SODIUM 40 MG/0.4ML IJ SOSY
40.0000 mg | PREFILLED_SYRINGE | INTRAMUSCULAR | Status: DC
  Administered 2022-05-12: 40 mg via SUBCUTANEOUS
  Filled 2022-05-12: qty 0.4

## 2022-05-12 NOTE — Anesthesia Postprocedure Evaluation (Signed)
Anesthesia Post Note  Patient: Christo Hain Shave  Procedure(s) Performed: CORONARY ARTERY BYPASS GRAFTING (CABG) TIMES FIVE USING ENDOSCOPICALLY HARVESTED RIGHT GREATER SAPHENOUS VEIN AND LEFT OPEN RADIAL HARVEST VEIN AND LEFT INTERNALLY MAMMARY ARTERY. (Chest) RADIAL ARTERY HARVEST (Left: Arm Lower) TRANSESOPHAGEAL ECHOCARDIOGRAM (TEE)     Patient location during evaluation: SICU Anesthesia Type: General Level of consciousness: sedated Pain management: pain level controlled Vital Signs Assessment: post-procedure vital signs reviewed and stable Respiratory status: patient remains intubated per anesthesia plan Cardiovascular status: stable Postop Assessment: no apparent nausea or vomiting Anesthetic complications: no   No notable events documented.  Last Vitals:  Vitals:   05/12/22 1900 05/12/22 2124  BP: 109/66 119/64  Pulse: 60 (!) 57  Resp: 12   Temp:    SpO2: 94%     Last Pain:  Vitals:   05/12/22 1600  TempSrc: Oral  PainSc: 0-No pain   Pain Goal: Patients Stated Pain Goal: 3 (05/11/22 1200)                 Kennieth Rad

## 2022-05-12 NOTE — Progress Notes (Addendum)
4 Days Post-Op Procedure(s) (LRB): CORONARY ARTERY BYPASS GRAFTING (CABG) TIMES FIVE USING ENDOSCOPICALLY HARVESTED RIGHT GREATER SAPHENOUS VEIN AND LEFT OPEN RADIAL HARVEST VEIN AND LEFT INTERNALLY MAMMARY ARTERY. (N/A) RADIAL ARTERY HARVEST (Left) TRANSESOPHAGEAL ECHOCARDIOGRAM (TEE) (N/A) Subjective: Afib over the weekend, started amiodarone gtt -> stopped due to bradycardia to HR 30s This morning, in Afib rates 80s Ambulating Tolerating diet, had BM Normoglycemic Pain well controlled  Objective: Vital signs in last 24 hours: Temp:  [97.9 F (36.6 C)-98.9 F (37.2 C)] 98.9 F (37.2 C) (08/21 1146) Pulse Rate:  [53-93] 55 (08/21 1200) Cardiac Rhythm: Normal sinus rhythm (08/21 0600) Resp:  [10-30] 30 (08/21 1200) BP: (102-151)/(62-95) 102/67 (08/21 1200) SpO2:  [90 %-98 %] 93 % (08/21 1200)  Hemodynamic parameters for last 24 hours:    Intake/Output from previous day: 08/20 0701 - 08/21 0700 In: 580 [P.O.:580] Out: 960 [Urine:960] Intake/Output this shift: Total I/O In: -  Out: 350 [Urine:350]  General appearance: alert, cooperative, appears stated age, and no distress Heart: regular rate and rhythm and S1, S2 normal Lungs: clear to auscultation bilaterally Abdomen: soft, nontender, nondistended Extremities: extremities normal, atraumatic, no cyanosis or edema Wound: midline sternotomy clean/dry/intact  Lab Results: Recent Labs    05/10/22 0459 05/11/22 0758  WBC 12.9* 12.5*  HGB 11.1* 11.3*  HCT 33.2* 34.7*  PLT 91* 101*   BMET:  Recent Labs    05/11/22 0758 05/12/22 0557  NA 133* 138  K 3.7 3.7  CL 102 104  CO2 23 28  GLUCOSE 97 110*  BUN 61* 59*  CREATININE 1.89* 1.59*  CALCIUM 8.1* 8.1*    PT/INR: No results for input(s): "LABPROT", "INR" in the last 72 hours. ABG    Component Value Date/Time   PHART 7.362 05/09/2022 0009   HCO3 22.8 05/09/2022 0009   TCO2 24 05/09/2022 0009   ACIDBASEDEF 2.0 05/09/2022 0009   O2SAT 97 05/09/2022 0009    CBG (last 3)  Recent Labs    05/12/22 0519 05/12/22 0822 05/12/22 1143  GLUCAP 94 129* 96    Assessment/Plan: S/P Procedure(s) (LRB): CORONARY ARTERY BYPASS GRAFTING (CABG) TIMES FIVE USING ENDOSCOPICALLY HARVESTED RIGHT GREATER SAPHENOUS VEIN AND LEFT OPEN RADIAL HARVEST VEIN AND LEFT INTERNALLY MAMMARY ARTERY. (N/A) RADIAL ARTERY HARVEST (Left) TRANSESOPHAGEAL ECHOCARDIOGRAM (TEE) (N/A) 33M 4 Days Post-Op s/p CABGx5, LIMA to LAD, SVG to PD, SVG to diag, sequential radial art to OM and PL grafted off diag vein. Course c/b thrombocytopenia, resolving; Afib. - ASA/statin - Afib: metoprolol tartrate 50mg  BID, restart 1/2 home dilt, 120mg  daily - d/c scheduled insulin - start lovenox DVT ppx - d/c central line, OOU - may need systemic anticoagulation pending persistence of AFib   LOS: 4 days    , MD MAS Resident 05/12/2022  Patient seen and examined, agree with above Looks great but in and out of a fib- add Diltiazem.   Jolene Schimke 05/14/2022, MD [facility] [phone]

## 2022-05-12 NOTE — Progress Notes (Signed)
CT surgery PM rounds  Patient resting comfortably after walking 3 laps today in ICU O2 saturation 93% on room air Normal sinus rhythm  Blood pressure 109/66, pulse 60, temperature 98.4 F (36.9 C), temperature source Oral, resp. rate 12, height 6\' 1"  (1.854 m), weight 101.3 kg, SpO2 94 %.

## 2022-05-12 NOTE — Progress Notes (Signed)
CARDIAC REHAB PHASE I   PRE:  Rate/Rhythm: 60 SR  BP:  Sitting: 116/65      SaO2: 96 RA  MODE:  Ambulation: 370 ft   POST:  Rate/Rhythm: 60 SR  BP:  Sitting: 102/68      SaO2: 97 RA  Pt ambulated in hall using four wheel for wheel walker. Tolerated well with no sob moving nice and slow but took no breaks. Back to room to chair with call bell and bedside table in reach. Pt using sternal precautions well when getting out of bed and sitting in chair. Encouraged ambulation and IS use . Will continue to follow.  [phone]  Woodroe Chen, RN BSN 05/12/2022 2:49 PM

## 2022-05-12 NOTE — Progress Notes (Signed)
Spoke with RN Eli who is aware of the DC central line removal.

## 2022-05-13 ENCOUNTER — Other Ambulatory Visit (HOSPITAL_COMMUNITY): Payer: Self-pay

## 2022-05-13 ENCOUNTER — Telehealth (HOSPITAL_COMMUNITY): Payer: Self-pay | Admitting: Pharmacy Technician

## 2022-05-13 ENCOUNTER — Inpatient Hospital Stay (HOSPITAL_COMMUNITY): Payer: PPO

## 2022-05-13 LAB — GLUCOSE, CAPILLARY
Glucose-Capillary: 127 mg/dL — ABNORMAL HIGH (ref 70–99)
Glucose-Capillary: 138 mg/dL — ABNORMAL HIGH (ref 70–99)
Glucose-Capillary: 122 mg/dL — ABNORMAL HIGH (ref 70–99)
Glucose-Capillary: 160 mg/dL — ABNORMAL HIGH (ref 70–99)

## 2022-05-13 LAB — CBC
HCT: 34.6 % — ABNORMAL LOW (ref 39.0–52.0)
Hemoglobin: 11.6 g/dL — ABNORMAL LOW (ref 13.0–17.0)
MCH: 30.7 pg (ref 26.0–34.0)
MCHC: 33.5 g/dL (ref 30.0–36.0)
MCV: 91.5 fL (ref 80.0–100.0)
Platelets: 134 10*3/uL — ABNORMAL LOW (ref 150–400)
RBC: 3.78 MIL/uL — ABNORMAL LOW (ref 4.22–5.81)
RDW: 14.2 % (ref 11.5–15.5)
WBC: 10.1 10*3/uL (ref 4.0–10.5)
nRBC: 1 % — ABNORMAL HIGH (ref 0.0–0.2)

## 2022-05-13 LAB — BASIC METABOLIC PANEL
Anion gap: 12 (ref 5–15)
BUN: 48 mg/dL — ABNORMAL HIGH (ref 8–23)
CO2: 24 mmol/L (ref 22–32)
Calcium: 8.2 mg/dL — ABNORMAL LOW (ref 8.9–10.3)
Chloride: 104 mmol/L (ref 98–111)
Creatinine, Ser: 1.5 mg/dL — ABNORMAL HIGH (ref 0.61–1.24)
GFR, Estimated: 46 mL/min — ABNORMAL LOW (ref >60)
Glucose, Bld: 192 mg/dL — ABNORMAL HIGH (ref 70–99)
Potassium: 3.3 mmol/L — ABNORMAL LOW (ref 3.5–5.1)
Sodium: 140 mmol/L (ref 135–145)

## 2022-05-13 MED ORDER — DILTIAZEM HCL ER COATED BEADS 120 MG PO CP24
240.0000 mg | ORAL_CAPSULE | Freq: Every day | ORAL | Status: DC
  Administered 2022-05-13 – 2022-05-14 (×2): 240 mg via ORAL
  Filled 2022-05-13 (×2): qty 1
  Filled 2022-05-13: qty 2

## 2022-05-13 MED ORDER — ASPIRIN 81 MG PO TBEC
81.0000 mg | DELAYED_RELEASE_TABLET | Freq: Every day | ORAL | Status: DC
  Administered 2022-05-13 – 2022-05-14 (×2): 81 mg via ORAL
  Filled 2022-05-13 (×2): qty 1

## 2022-05-13 MED ORDER — SODIUM CHLORIDE 0.9 % IV SOLN: 250.0000 mL | INTRAVENOUS | Status: DC | PRN

## 2022-05-13 MED ORDER — METOPROLOL TARTRATE 25 MG PO TABS
25.0000 mg | ORAL_TABLET | Freq: Two times a day (BID) | ORAL | Status: DC
  Administered 2022-05-13 – 2022-05-14 (×2): 25 mg via ORAL
  Filled 2022-05-13 (×2): qty 1

## 2022-05-13 MED ORDER — SODIUM CHLORIDE 0.9% FLUSH
3.0000 mL | Freq: Two times a day (BID) | INTRAVENOUS | Status: DC
  Administered 2022-05-13 – 2022-05-14 (×3): 3 mL via INTRAVENOUS

## 2022-05-13 MED ORDER — ~~LOC~~ CARDIAC SURGERY, PATIENT & FAMILY EDUCATION: Freq: Once | Status: DC

## 2022-05-13 MED ORDER — SODIUM CHLORIDE 0.9% FLUSH: 3.0000 mL | INTRAVENOUS | Status: DC | PRN

## 2022-05-13 MED ORDER — APIXABAN 2.5 MG PO TABS
2.5000 mg | ORAL_TABLET | Freq: Two times a day (BID) | ORAL | Status: DC
  Administered 2022-05-13 – 2022-05-14 (×3): 2.5 mg via ORAL
  Filled 2022-05-13 (×3): qty 1

## 2022-05-13 NOTE — Progress Notes (Signed)
ANTICOAGULATION CONSULT NOTE - Initial Consult  Pharmacy Consult for apixaban Indication: atrial fibrillation  No Known Allergies  Patient Measurements: Height: 6\' 1"  (185.4 cm) Weight: 101.3 kg (223 lb 5.2 oz) IBW/kg (Calculated) : 79.9  Vital Signs: Temp: 98.4 F (36.9 C) (08/22 0400) BP: 139/81 (08/22 0600) Pulse Rate: 67 (08/22 0600)  Labs: Recent Labs    05/11/22 0758 05/12/22 0557  HGB 11.3*  --   HCT 34.7*  --   PLT 101*  --   CREATININE 1.89* 1.59*    Estimated Creatinine Clearance: 45.6 mL/min (A) (by C-G formula based on SCr of 1.59 mg/dL (H)).   Medical History: Past Medical History:  Diagnosis Date   Arthritis    History of kidney stones    Hyperlipidemia    Hypertension    Nocturia     Medications:  Scheduled:   acetaminophen  1,000 mg Oral Q6H   Or   acetaminophen (TYLENOL) oral liquid 160 mg/5 mL  1,000 mg Per Tube Q6H   apixaban  2.5 mg Oral BID   aspirin EC  81 mg Oral Daily   atorvastatin  40 mg Oral Daily   bisacodyl  10 mg Oral Daily   Or   bisacodyl  10 mg Rectal Daily   Chlorhexidine Gluconate Cloth  6 each Topical Daily   diltiazem  240 mg Oral Daily   docusate sodium  200 mg Oral Daily   insulin aspart  0-15 Units Subcutaneous TID WC   isosorbide mononitrate  30 mg Oral Daily   metoprolol tartrate  50 mg Oral BID   mouth rinse  15 mL Mouth Rinse QID   pantoprazole  40 mg Oral Daily   sodium chloride flush  10-40 mL Intracatheter Q12H   sodium chloride flush  3 mL Intravenous Q12H    Assessment: 80 yom s/p CABG on 8/17. No AC PTA. Has been in and out of Afib - now planning to start apixaban.  Given age>80 and Scr >1.5 despite wt >60 kg, will qualify for low dose apixaban. Hgb 11.3, plt 101. No s/sx of bleeding.   Goal of Therapy:  Monitor platelets by anticoagulation protocol: Yes   Plan:  Apixaban 2.5 mg BID  Stop enoxaparin and reduced aspirin given DOAC start Monitor CBC, renal fx, and for s/sx of  bleeding  9/17, PharmD, BCCCP Clinical Pharmacist  Phone: [phone] 05/13/2022 7:40 AM  Please check AMION for all [facility] phone numbers After 10:00 PM, call Main Pharmacy [phone]

## 2022-05-13 NOTE — Progress Notes (Signed)
Pt arrived to 4E from 2H. Midline incision c/d/I. A&Ox4. VSS. Tele applied; CCMD called. Pt oriented to room and call light in reach.  Brooke Pace, RN

## 2022-05-13 NOTE — TOC Benefit Eligibility Note (Signed)
Patient Product/process development scientist completed.    The patient is currently admitted and upon discharge could be taking Eliquis 2.5 mg.  The current 30 day co-pay is $40.00.   The patient is insured through [employer] Medicare Part D     Roland Earl, CPhT Pharmacy Patient Advocate Specialist [facility] Patient Advocate Team Direct Number: [phone]  Fax: [phone]

## 2022-05-13 NOTE — Progress Notes (Signed)
Attempted to call nurse to give report, was told RN will call to get report after RN comes out of a room.

## 2022-05-13 NOTE — Progress Notes (Signed)
CARDIAC REHAB PHASE I   Pt is resting in chair with family at bedside. Per pt he walked full lap around unit and has gotten back to room to chair. He states that he tolerated well with no sob.Encouraged pt to get 2 more walks today and continue IS use. Post OHS  education including site care, exercise guidelines, restrictions, heart healthy diet, IS use, home needs, sternal precautions, move in tub, and CRP2. Will refer to [facility] for CRP2. All questions and concerns addressed. Will continue to reinforced education as pt progresses towards home. Will continue to follow.   [phone]  Woodroe Chen, RN BSN 05/13/2022 11:44 AM

## 2022-05-13 NOTE — Progress Notes (Addendum)
5 Days Post-Op Procedure(s) (LRB): CORONARY ARTERY BYPASS GRAFTING (CABG) TIMES FIVE USING ENDOSCOPICALLY HARVESTED RIGHT GREATER SAPHENOUS VEIN AND LEFT OPEN RADIAL HARVEST VEIN AND LEFT INTERNALLY MAMMARY ARTERY. (N/A) RADIAL ARTERY HARVEST (Left) TRANSESOPHAGEAL ECHOCARDIOGRAM (TEE) (N/A) Subjective: Feeling well. Converted to sinus overnight; Afib again this morning rates 120s SBP 110-140  Tolerating diet, walking.  Objective: Vital signs in last 24 hours: Temp:  [98.4 F (36.9 C)-98.9 F (37.2 C)] 98.4 F (36.9 C) (08/22 0400) Pulse Rate:  [54-91] 67 (08/22 0600) Cardiac Rhythm: Normal sinus rhythm;Sinus bradycardia (08/22 0000) Resp:  [0-30] 12 (08/22 0600) BP: (97-139)/(60-88) 139/81 (08/22 0600) SpO2:  [80 %-98 %] 92 % (08/22 0600) Weight:  [101.3 kg] 101.3 kg (08/22 0500)  Hemodynamic parameters for last 24 hours:    Intake/Output from previous day: 08/21 0701 - 08/22 0700 In: 360 [P.O.:360] Out: 1000 [Urine:1000] Intake/Output this shift: No intake/output data recorded.  General appearance: alert, cooperative, and appears stated age Heart: irregularly irregular rhythm Lungs: clear to auscultation bilaterally Abdomen: soft, NT, ND Extremities: extremities normal, atraumatic, no cyanosis or edema Wound: with betadine paint, c/d/i  Lab Results: Recent Labs    05/11/22 0758  WBC 12.5*  HGB 11.3*  HCT 34.7*  PLT 101*   BMET:  Recent Labs    05/11/22 0758 05/12/22 0557  NA 133* 138  K 3.7 3.7  CL 102 104  CO2 23 28  GLUCOSE 97 110*  BUN 61* 59*  CREATININE 1.89* 1.59*  CALCIUM 8.1* 8.1*    PT/INR: No results for input(s): "LABPROT", "INR" in the last 72 hours. ABG    Component Value Date/Time   PHART 7.362 05/09/2022 0009   HCO3 22.8 05/09/2022 0009   TCO2 24 05/09/2022 0009   ACIDBASEDEF 2.0 05/09/2022 0009   O2SAT 97 05/09/2022 0009   CBG (last 3)  Recent Labs    05/12/22 1556 05/12/22 2243 05/13/22 0637  GLUCAP 105* 126* 127*     Assessment/Plan: S/P Procedure(s) (LRB): CORONARY ARTERY BYPASS GRAFTING (CABG) TIMES FIVE USING ENDOSCOPICALLY HARVESTED RIGHT GREATER SAPHENOUS VEIN AND LEFT OPEN RADIAL HARVEST VEIN AND LEFT INTERNALLY MAMMARY ARTERY. (N/A) RADIAL ARTERY HARVEST (Left) TRANSESOPHAGEAL ECHOCARDIOGRAM (TEE) (N/A) 65M 5 Days Post-Op s/p CABGx5, LIMA to LAD, SVG to PD, SVG to diag, sequential radial art to OM and PL grafted off diag vein. Course c/b thrombocytopenia, resolving; Afib. - Afib: metoprolol tartrate 50 BID, increase dilt to home dose 240mg  daily - start eliquis 2.5 BID - f/u labs - out of unit    LOS: 5 days   , MD [facility] Resident 05/13/2022  Patient seen and examined.  Agree with above.  Went back into atrial fibrillation with ventricular rate in the 110-120 range this morning.  Will increase diltiazem.  If persist will consider restarting amiodarone. Given recurrent atrial fibrillation we will anticoagulate with Eliquis. Overall doing well. Awaiting telemetry bed  05/15/2022 C. Viviann Spare, MD [facility] [phone]

## 2022-05-13 NOTE — Telephone Encounter (Signed)
Pharmacy Patient Advocate Encounter  Insurance verification completed.    The patient is insured through [employer] Part D   The patient is currently admitted and ran test claims for the following: Eliquis.  Copays and coinsurance results were relayed to Inpatient clinical team.      

## 2022-05-14 LAB — BASIC METABOLIC PANEL
Anion gap: 8 (ref 5–15)
BUN: 36 mg/dL — ABNORMAL HIGH (ref 8–23)
CO2: 24 mmol/L (ref 22–32)
Calcium: 7.9 mg/dL — ABNORMAL LOW (ref 8.9–10.3)
Chloride: 106 mmol/L (ref 98–111)
Creatinine, Ser: 1.23 mg/dL (ref 0.61–1.24)
GFR, Estimated: 59 mL/min — ABNORMAL LOW (ref >60)
Glucose, Bld: 138 mg/dL — ABNORMAL HIGH (ref 70–99)
Potassium: 3.5 mmol/L (ref 3.5–5.1)
Sodium: 138 mmol/L (ref 135–145)

## 2022-05-14 LAB — GLUCOSE, CAPILLARY
Glucose-Capillary: 135 mg/dL — ABNORMAL HIGH (ref 70–99)
Glucose-Capillary: 189 mg/dL — ABNORMAL HIGH (ref 70–99)

## 2022-05-14 MED ORDER — FOSINOPRIL SODIUM 20 MG PO TABS: 20.0000 mg | ORAL_TABLET | Freq: Every day | ORAL | Status: DC

## 2022-05-14 MED ORDER — APIXABAN 5 MG PO TABS: 5.0000 mg | ORAL_TABLET | Freq: Two times a day (BID) | ORAL | 3 refills | Status: DC

## 2022-05-14 MED ORDER — APIXABAN 5 MG PO TABS: 5.0000 mg | ORAL_TABLET | Freq: Two times a day (BID) | ORAL | Status: DC

## 2022-05-14 MED ORDER — ISOSORBIDE MONONITRATE ER 30 MG PO TB24: 30.0000 mg | ORAL_TABLET | Freq: Every day | ORAL | 0 refills | Status: DC

## 2022-05-14 MED ORDER — HYDROCHLOROTHIAZIDE 25 MG PO TABS: 25.0000 mg | ORAL_TABLET | Freq: Every day | ORAL | Status: DC

## 2022-05-14 MED ORDER — ATORVASTATIN CALCIUM 40 MG PO TABS: 40.0000 mg | ORAL_TABLET | Freq: Every day | ORAL | 3 refills | Status: DC

## 2022-05-14 MED ORDER — TRAMADOL HCL 50 MG PO TABS
50.0000 mg | ORAL_TABLET | ORAL | 0 refills | Status: DC | PRN
Start: 2022-05-14 — End: 2022-08-27

## 2022-05-14 MED ORDER — METOPROLOL TARTRATE 25 MG PO TABS: 25.0000 mg | ORAL_TABLET | Freq: Two times a day (BID) | ORAL | 3 refills | Status: DC

## 2022-05-14 NOTE — TOC Transition Note (Signed)
Transition of Care (TOC) - CM/SW Discharge Note Donn Pierini RN, BSN Transitions of Care Unit 4E- RN Case Manager See Treatment Team for direct phone #    Patient Details  Name: [name] Goodman MRN: [id number] Date of Birth: 1940/09/25  Transition of Care [facility]) CM/SW Contact:  Darrold Span, RN Phone Number: 05/14/2022, 1:05 PM   Clinical Narrative:    Pt stable for transition home today, family to transport home. No DME needs noted. CM has been notified by [place] they are following for [facility] needs with TCTS office referral for [facility] protocol. They will f/u on discharge and call pt to schedule. CM has notified Misty Stanley w/ [id number] of discharge home today.    Final next level of care: Home w Home Health Services Barriers to Discharge: No Barriers Identified   Patient Goals and CMS Choice    Office referral for [facility] protocol    Discharge Placement               Home w/ [facility]        Discharge Plan and Services                DME Arranged: N/A DME Agency: NA       HH Arranged: RN, PT HH Agency: [facility] Date HH Agency Contacted: 05/14/22 Time HH Agency Contacted: 1305 Representative spoke with at [facility] Agency: Misty Stanley  Social Determinants of Health (SDOH) Interventions     Readmission Risk Interventions    05/14/2022    1:05 PM  Readmission Risk Prevention Plan  Post Dischage Appt Not Complete  Appt Comments Per surgeon  Medication Screening Complete  Transportation Screening Complete

## 2022-05-14 NOTE — Progress Notes (Signed)
Patient Discharged home, sutures removed prior to D/C, transported by family

## 2022-05-14 NOTE — Progress Notes (Addendum)
      [address]       [place] [address]             [phone]       6 Days Post-Op Procedure(s) (LRB): CORONARY ARTERY BYPASS GRAFTING (CABG) TIMES FIVE USING ENDOSCOPICALLY HARVESTED RIGHT GREATER SAPHENOUS VEIN AND LEFT OPEN RADIAL HARVEST VEIN AND LEFT INTERNALLY MAMMARY ARTERY. (N/A) RADIAL ARTERY HARVEST (Left) TRANSESOPHAGEAL ECHOCARDIOGRAM (TEE) (N/A)  Subjective:  Patient feels great, he wants to go home, states he tired of sitting around.  + ambulation   + BM  Objective: Vital signs in last 24 hours: Temp:  [98 F (36.7 C)-98.5 F (36.9 C)] 98.4 F (36.9 C) (08/23 0745) Pulse Rate:  [61-107] 76 (08/23 0745) Cardiac Rhythm: Atrial fibrillation (08/22 1254) Resp:  [17-24] 17 (08/23 0745) BP: (102-151)/(63-88) 151/88 (08/23 0745) SpO2:  [89 %-98 %] 98 % (08/23 0745) Weight:  [100.7 kg] 100.7 kg (08/23 0500)  Intake/Output from previous day: 08/22 0701 - 08/23 0700 In: 610 [P.O.:600; I.V.:10] Out: 700 [Urine:700]  General appearance: alert, cooperative, and no distress Heart: regular rate and rhythm Lungs: clear to auscultation bilaterally Abdomen: soft, non-tender; bowel sounds normal; no masses,  no organomegaly Extremities: edema trace Wound: clean and dry  Lab Results: Recent Labs    05/13/22 0737  WBC 10.1  HGB 11.6*  HCT 34.6*  PLT 134*   BMET:  Recent Labs    05/13/22 0737 05/14/22 0321  NA 140 138  K 3.3* 3.5  CL 104 106  CO2 24 24  GLUCOSE 192* 138*  BUN 48* 36*  CREATININE 1.50* 1.23  CALCIUM 8.2* 7.9*    PT/INR: No results for input(s): "LABPROT", "INR" in the last 72 hours. ABG    Component Value Date/Time   PHART 7.362 05/09/2022 0009   HCO3 22.8 05/09/2022 0009   TCO2 24 05/09/2022 0009   ACIDBASEDEF 2.0 05/09/2022 0009   O2SAT 97 05/09/2022 0009   CBG (last 3)  Recent Labs    05/13/22 1613 05/13/22 2129 05/14/22 0614  GLUCAP 122* 160* 135*    Assessment/Plan: S/P Procedure(s)  (LRB): CORONARY ARTERY BYPASS GRAFTING (CABG) TIMES FIVE USING ENDOSCOPICALLY HARVESTED RIGHT GREATER SAPHENOUS VEIN AND LEFT OPEN RADIAL HARVEST VEIN AND LEFT INTERNALLY MAMMARY ARTERY. (N/A) RADIAL ARTERY HARVEST (Left) TRANSESOPHAGEAL ECHOCARDIOGRAM (TEE) (N/A)  CV- PAF, maintaining NSR with PACs currently- continue Cardizem, Lopressor, Eliquis Pulm- no acute issues, not requiring oxygen, continue IS Renal- creatinine continues to improve, down to 1.2, does not appear edematous on exam CBGs- controlled, not a diabetic will d/c SSIP Dispo- patient stable, really wants to go home, currently in NSR, creatinine improved, possibly for d/c today vs tomorrow   LOS: 6 days   [name] Dandy, PA-C 05/14/2022  Patient seen and examined, looks great In Sr x 24 hours Will dc home today on Lopressor, diltiazem and Eliquis  [name] Spare C. Dorris Fetch, MD [facility] [phone]

## 2022-05-14 NOTE — Progress Notes (Signed)
ANTICOAGULATION CONSULT NOTE - Follow Up  Pharmacy Consult for apixaban Indication: atrial fibrillation  No Known Allergies  Patient Measurements: Height: 6\' 1"  (185.4 cm) Weight: 100.7 kg (222 lb 0.1 oz) IBW/kg (Calculated) : 79.9  Vital Signs: Temp: 98.4 F (36.9 C) (08/23 0745) Temp Source: Oral (08/23 0745) BP: 151/88 (08/23 0745) Pulse Rate: 76 (08/23 0745)  Labs: Recent Labs    05/12/22 0557 05/13/22 0737 05/14/22 0321  HGB  --  11.6*  --   HCT  --  34.6*  --   PLT  --  134*  --   CREATININE 1.59* 1.50* 1.23     Estimated Creatinine Clearance: 58.8 mL/min (by C-G formula based on SCr of 1.23 mg/dL).   Medical History: Past Medical History:  Diagnosis Date   Arthritis    History of kidney stones    Hyperlipidemia    Hypertension    Nocturia     Medications:  Scheduled:   apixaban  2.5 mg Oral BID   aspirin EC  81 mg Oral Daily   atorvastatin  40 mg Oral Daily   bisacodyl  10 mg Oral Daily   Or   bisacodyl  10 mg Rectal Daily   Chlorhexidine Gluconate Cloth  6 each Topical Daily   [facility] Patient & Family Education   Does not apply Once   diltiazem  240 mg Oral Daily   insulin aspart  0-15 Units Subcutaneous TID WC   isosorbide mononitrate  30 mg Oral Daily   metoprolol tartrate  25 mg Oral BID   mouth rinse  15 mL Mouth Rinse QID   pantoprazole  40 mg Oral Daily   sodium chloride flush  3 mL Intravenous Q12H    Assessment: 80 yom s/p CABG on 8/17. No AC PTA. Has been in and out of Afib - now planning to start apixaban.  Originally started on Eliquis 2.5mg  BID for Age >80 and Scr > 1.5. AKI continues to resolve with Scr 1.23 today. Baseline appears to be 1-1.3. Anticipate discharge in next 24-48 hours pending clinical course.  Goal of Therapy:  Monitor platelets by anticoagulation protocol: Yes   Plan:  Change to Eliquis 5mg  BID given resolving AKI Monitor CBC, renal fx, and for s/sx of bleeding  9/17,  PharmD Clinical Pharmacist  05/14/2022 9:09 AM  Please check AMION for all [facility] Pharmacy phone numbers After 10:00 PM, call Main Pharmacy [phone]

## 2022-05-14 NOTE — Progress Notes (Signed)
CARDIAC REHAB PHASE I   PRE:  Rate/Rhythm: 69 SR PVCs   BP:  Not taken   SaO2: 99 RA  MODE:  Ambulation: 430 ft   POST:  Rate/Rhythm: 72 SR  BP:  Sitting: 139/83   SaO2: 98  Pt doing well following sternal precautions when exiting bed. Pt ambulated in hallway with RW and standby assist with no s/s. Education reviewed with pt and family and IS encouraged after discharge, all questions from pt and family answered. Pt left in recliner with call bell in reach.  [phone]  Jonna Coup, MS 05/14/2022 12:01 PM

## 2022-05-14 NOTE — Care Management Important Message (Signed)
Important Message  Patient Details  Name: [name] Goodman MRN: [id number] Date of Birth: [birth year]   Medicare Important Message Given:  Yes     Renie Ora 05/14/2022, 7:49 AM

## 2022-05-18 ENCOUNTER — Emergency Department (HOSPITAL_COMMUNITY): Payer: PPO

## 2022-05-18 ENCOUNTER — Encounter (HOSPITAL_COMMUNITY): Payer: Self-pay | Admitting: Emergency Medicine

## 2022-05-18 ENCOUNTER — Other Ambulatory Visit: Payer: Self-pay

## 2022-05-18 ENCOUNTER — Inpatient Hospital Stay (HOSPITAL_COMMUNITY)
Admission: EM | Admit: 2022-05-18 | Discharge: 2022-05-28 | DRG: 246 | Disposition: A | Discharge status: Home Health | Payer: PPO | Attending: Internal Medicine | Admitting: Internal Medicine

## 2022-05-18 DIAGNOSIS — I2699 Other pulmonary embolism without acute cor pulmonale: Secondary | ICD-10-CM

## 2022-05-18 DIAGNOSIS — I482 Chronic atrial fibrillation, unspecified: Secondary | ICD-10-CM | POA: Diagnosis present

## 2022-05-18 DIAGNOSIS — I493 Ventricular premature depolarization: Secondary | ICD-10-CM | POA: Diagnosis present

## 2022-05-18 DIAGNOSIS — Z961 Presence of intraocular lens: Secondary | ICD-10-CM | POA: Diagnosis present

## 2022-05-18 DIAGNOSIS — I25810 Atherosclerosis of coronary artery bypass graft(s) without angina pectoris: Secondary | ICD-10-CM | POA: Diagnosis present

## 2022-05-18 DIAGNOSIS — I5022 Chronic systolic (congestive) heart failure: Secondary | ICD-10-CM | POA: Diagnosis present

## 2022-05-18 DIAGNOSIS — I255 Ischemic cardiomyopathy: Secondary | ICD-10-CM | POA: Diagnosis present

## 2022-05-18 DIAGNOSIS — R001 Bradycardia, unspecified: Secondary | ICD-10-CM | POA: Diagnosis not present

## 2022-05-18 DIAGNOSIS — R778 Other specified abnormalities of plasma proteins: Secondary | ICD-10-CM | POA: Diagnosis not present

## 2022-05-18 DIAGNOSIS — I130 Hypertensive heart and chronic kidney disease with heart failure and stage 1 through stage 4 chronic kidney disease, or unspecified chronic kidney disease: Principal | ICD-10-CM | POA: Diagnosis present

## 2022-05-18 DIAGNOSIS — R0603 Acute respiratory distress: Secondary | ICD-10-CM | POA: Diagnosis not present

## 2022-05-18 DIAGNOSIS — J9601 Acute respiratory failure with hypoxia: Secondary | ICD-10-CM | POA: Diagnosis present

## 2022-05-18 DIAGNOSIS — I480 Paroxysmal atrial fibrillation: Secondary | ICD-10-CM | POA: Diagnosis not present

## 2022-05-18 DIAGNOSIS — I5023 Acute on chronic systolic (congestive) heart failure: Secondary | ICD-10-CM | POA: Diagnosis present

## 2022-05-18 DIAGNOSIS — Z951 Presence of aortocoronary bypass graft: Secondary | ICD-10-CM

## 2022-05-18 DIAGNOSIS — Z9841 Cataract extraction status, right eye: Secondary | ICD-10-CM

## 2022-05-18 DIAGNOSIS — I472 Ventricular tachycardia, unspecified: Secondary | ICD-10-CM | POA: Diagnosis present

## 2022-05-18 DIAGNOSIS — Z79899 Other long term (current) drug therapy: Secondary | ICD-10-CM

## 2022-05-18 DIAGNOSIS — I2693 Single subsegmental pulmonary embolism without acute cor pulmonale: Secondary | ICD-10-CM | POA: Diagnosis present

## 2022-05-18 DIAGNOSIS — M1990 Unspecified osteoarthritis, unspecified site: Secondary | ICD-10-CM | POA: Diagnosis present

## 2022-05-18 DIAGNOSIS — N189 Chronic kidney disease, unspecified: Secondary | ICD-10-CM | POA: Diagnosis not present

## 2022-05-18 DIAGNOSIS — Z955 Presence of coronary angioplasty implant and graft: Secondary | ICD-10-CM | POA: Diagnosis not present

## 2022-05-18 DIAGNOSIS — Z96653 Presence of artificial knee joint, bilateral: Secondary | ICD-10-CM | POA: Diagnosis present

## 2022-05-18 DIAGNOSIS — Z20822 Contact with and (suspected) exposure to covid-19: Secondary | ICD-10-CM | POA: Diagnosis present

## 2022-05-18 DIAGNOSIS — Z87442 Personal history of urinary calculi: Secondary | ICD-10-CM | POA: Diagnosis not present

## 2022-05-18 DIAGNOSIS — E785 Hyperlipidemia, unspecified: Secondary | ICD-10-CM | POA: Diagnosis present

## 2022-05-18 DIAGNOSIS — R0602 Shortness of breath: Secondary | ICD-10-CM

## 2022-05-18 DIAGNOSIS — Z87891 Personal history of nicotine dependence: Secondary | ICD-10-CM

## 2022-05-18 DIAGNOSIS — J811 Chronic pulmonary edema: Secondary | ICD-10-CM | POA: Diagnosis not present

## 2022-05-18 DIAGNOSIS — I2510 Atherosclerotic heart disease of native coronary artery without angina pectoris: Secondary | ICD-10-CM | POA: Diagnosis present

## 2022-05-18 DIAGNOSIS — J918 Pleural effusion in other conditions classified elsewhere: Secondary | ICD-10-CM | POA: Diagnosis present

## 2022-05-18 DIAGNOSIS — J9811 Atelectasis: Secondary | ICD-10-CM | POA: Diagnosis present

## 2022-05-18 DIAGNOSIS — Z7982 Long term (current) use of aspirin: Secondary | ICD-10-CM

## 2022-05-18 DIAGNOSIS — I10 Essential (primary) hypertension: Secondary | ICD-10-CM | POA: Diagnosis present

## 2022-05-18 DIAGNOSIS — T502X5A Adverse effect of carbonic-anhydrase inhibitors, benzothiadiazides and other diuretics, initial encounter: Secondary | ICD-10-CM | POA: Diagnosis not present

## 2022-05-18 DIAGNOSIS — Z8249 Family history of ischemic heart disease and other diseases of the circulatory system: Secondary | ICD-10-CM

## 2022-05-18 DIAGNOSIS — R0902 Hypoxemia: Secondary | ICD-10-CM | POA: Diagnosis not present

## 2022-05-18 DIAGNOSIS — J939 Pneumothorax, unspecified: Secondary | ICD-10-CM | POA: Diagnosis not present

## 2022-05-18 DIAGNOSIS — J90 Pleural effusion, not elsewhere classified: Secondary | ICD-10-CM | POA: Diagnosis not present

## 2022-05-18 DIAGNOSIS — N179 Acute kidney failure, unspecified: Secondary | ICD-10-CM | POA: Diagnosis present

## 2022-05-18 DIAGNOSIS — I5021 Acute systolic (congestive) heart failure: Secondary | ICD-10-CM | POA: Diagnosis not present

## 2022-05-18 DIAGNOSIS — N281 Cyst of kidney, acquired: Secondary | ICD-10-CM | POA: Diagnosis not present

## 2022-05-18 DIAGNOSIS — R0600 Dyspnea, unspecified: Secondary | ICD-10-CM | POA: Diagnosis not present

## 2022-05-18 DIAGNOSIS — Z7901 Long term (current) use of anticoagulants: Secondary | ICD-10-CM

## 2022-05-18 DIAGNOSIS — E876 Hypokalemia: Secondary | ICD-10-CM | POA: Diagnosis not present

## 2022-05-18 DIAGNOSIS — R7989 Other specified abnormal findings of blood chemistry: Secondary | ICD-10-CM

## 2022-05-18 DIAGNOSIS — N1831 Chronic kidney disease, stage 3a: Secondary | ICD-10-CM | POA: Diagnosis present

## 2022-05-18 DIAGNOSIS — I509 Heart failure, unspecified: Secondary | ICD-10-CM | POA: Diagnosis not present

## 2022-05-18 DIAGNOSIS — Z9079 Acquired absence of other genital organ(s): Secondary | ICD-10-CM

## 2022-05-18 DIAGNOSIS — R079 Chest pain, unspecified: Secondary | ICD-10-CM | POA: Diagnosis not present

## 2022-05-18 HISTORY — DX: Presence of aortocoronary bypass graft: Z95.1

## 2022-05-18 LAB — CBC WITH DIFFERENTIAL/PLATELET
Abs Immature Granulocytes: 0.06 10*3/uL (ref 0.00–0.07)
Basophils Absolute: 0 10*3/uL (ref 0.0–0.1)
Basophils Relative: 0 %
Eosinophils Absolute: 0.3 10*3/uL (ref 0.0–0.5)
Eosinophils Relative: 2 %
HCT: 36 % — ABNORMAL LOW (ref 39.0–52.0)
Hemoglobin: 11.5 g/dL — ABNORMAL LOW (ref 13.0–17.0)
Immature Granulocytes: 0 %
Lymphocytes Relative: 7 %
Lymphs Abs: 1 10*3/uL (ref 0.7–4.0)
MCH: 30.3 pg (ref 26.0–34.0)
MCHC: 31.9 g/dL (ref 30.0–36.0)
MCV: 95 fL (ref 80.0–100.0)
Monocytes Absolute: 0.9 10*3/uL (ref 0.1–1.0)
Monocytes Relative: 7 %
Neutro Abs: 11.1 10*3/uL — ABNORMAL HIGH (ref 1.7–7.7)
Neutrophils Relative %: 84 %
Platelets: 326 10*3/uL (ref 150–400)
RBC: 3.79 MIL/uL — ABNORMAL LOW (ref 4.22–5.81)
RDW: 14.9 % (ref 11.5–15.5)
WBC: 13.4 10*3/uL — ABNORMAL HIGH (ref 4.0–10.5)
nRBC: 0 % (ref 0.0–0.2)

## 2022-05-18 LAB — COMPREHENSIVE METABOLIC PANEL
ALT: 33 U/L (ref 0–44)
AST: 19 U/L (ref 15–41)
Albumin: 3.2 g/dL — ABNORMAL LOW (ref 3.5–5.0)
Alkaline Phosphatase: 67 U/L (ref 38–126)
Anion gap: 10 (ref 5–15)
BUN: 21 mg/dL (ref 8–23)
CO2: 23 mmol/L (ref 22–32)
Calcium: 8.4 mg/dL — ABNORMAL LOW (ref 8.9–10.3)
Chloride: 103 mmol/L (ref 98–111)
Creatinine, Ser: 1.33 mg/dL — ABNORMAL HIGH (ref 0.61–1.24)
GFR, Estimated: 54 mL/min — ABNORMAL LOW (ref >60)
Glucose, Bld: 141 mg/dL — ABNORMAL HIGH (ref 70–99)
Potassium: 4.6 mmol/L (ref 3.5–5.1)
Sodium: 136 mmol/L (ref 135–145)
Total Bilirubin: 1.1 mg/dL (ref 0.3–1.2)
Total Protein: 6 g/dL — ABNORMAL LOW (ref 6.5–8.1)

## 2022-05-18 LAB — URINALYSIS, ROUTINE W REFLEX MICROSCOPIC
Bacteria, UA: NONE SEEN
Bilirubin Urine: NEGATIVE
Glucose, UA: NEGATIVE mg/dL
Ketones, ur: NEGATIVE mg/dL
Nitrite: NEGATIVE
Protein, ur: 100 mg/dL — AB
Specific Gravity, Urine: 1.02 (ref 1.005–1.030)
pH: 5 (ref 5.0–8.0)

## 2022-05-18 LAB — PROTIME-INR
INR: 1.8 — ABNORMAL HIGH (ref 0.8–1.2)
Prothrombin Time: 20.9 seconds — ABNORMAL HIGH (ref 11.4–15.2)

## 2022-05-18 LAB — RESP PANEL BY RT-PCR (FLU A&B, COVID) ARPGX2
Influenza A by PCR: NEGATIVE
Influenza B by PCR: NEGATIVE
SARS Coronavirus 2 by RT PCR: NEGATIVE

## 2022-05-18 LAB — APTT: aPTT: 36 seconds (ref 24–36)

## 2022-05-18 LAB — TROPONIN I (HIGH SENSITIVITY)
Troponin I (High Sensitivity): 277 ng/L (ref <18)
Troponin I (High Sensitivity): 255 ng/L (ref <18)

## 2022-05-18 LAB — BRAIN NATRIURETIC PEPTIDE: B Natriuretic Peptide: 961 pg/mL — ABNORMAL HIGH (ref 0.0–100.0)

## 2022-05-18 LAB — HEPARIN LEVEL (UNFRACTIONATED): Heparin Unfractionated: >1.1 IU/mL — ABNORMAL HIGH (ref 0.30–0.70)

## 2022-05-18 MED ORDER — ALBUTEROL SULFATE (2.5 MG/3ML) 0.083% IN NEBU: 2.5000 mg | INHALATION_SOLUTION | RESPIRATORY_TRACT | Status: DC | PRN

## 2022-05-18 MED ORDER — METOPROLOL TARTRATE 25 MG PO TABS
25.0000 mg | ORAL_TABLET | Freq: Two times a day (BID) | ORAL | Status: DC
  Administered 2022-05-18 – 2022-05-20 (×4): 25 mg via ORAL
  Filled 2022-05-18 (×4): qty 1

## 2022-05-18 MED ORDER — IOHEXOL 350 MG/ML SOLN
80.0000 mL | Freq: Once | INTRAVENOUS | Status: AC | PRN
  Administered 2022-05-18: 80 mL via INTRAVENOUS

## 2022-05-18 MED ORDER — ASPIRIN 81 MG PO TBEC
81.0000 mg | DELAYED_RELEASE_TABLET | Freq: Every day | ORAL | Status: DC
  Administered 2022-05-19 – 2022-05-22 (×4): 81 mg via ORAL
  Filled 2022-05-18 (×5): qty 1

## 2022-05-18 MED ORDER — FUROSEMIDE 10 MG/ML IJ SOLN
20.0000 mg | Freq: Once | INTRAMUSCULAR | Status: AC
  Administered 2022-05-18: 20 mg via INTRAVENOUS
  Filled 2022-05-18: qty 2

## 2022-05-18 MED ORDER — POLYETHYLENE GLYCOL 3350 17 G PO PACK
17.0000 g | PACK | Freq: Every day | ORAL | Status: DC | PRN
  Filled 2022-05-18: qty 1

## 2022-05-18 MED ORDER — DILTIAZEM HCL ER COATED BEADS 240 MG PO CP24
240.0000 mg | ORAL_CAPSULE | Freq: Every day | ORAL | Status: DC
  Administered 2022-05-19 – 2022-05-20 (×2): 240 mg via ORAL
  Filled 2022-05-18 (×3): qty 1

## 2022-05-18 MED ORDER — HYDRALAZINE HCL 20 MG/ML IJ SOLN: 5.0000 mg | INTRAMUSCULAR | Status: DC | PRN

## 2022-05-18 MED ORDER — HEPARIN (PORCINE) 25000 UT/250ML-% IV SOLN
1600.0000 [IU]/h | INTRAVENOUS | Status: DC
  Administered 2022-05-18 – 2022-05-19 (×2): 1600 [IU]/h via INTRAVENOUS
  Filled 2022-05-18 (×3): qty 250

## 2022-05-18 MED ORDER — INSULIN ASPART 100 UNIT/ML IJ SOLN
0.0000 [IU] | Freq: Three times a day (TID) | INTRAMUSCULAR | Status: DC
  Administered 2022-05-19 – 2022-05-20 (×3): 2 [IU] via SUBCUTANEOUS
  Administered 2022-05-20: 3 [IU] via SUBCUTANEOUS
  Administered 2022-05-20 – 2022-05-22 (×2): 2 [IU] via SUBCUTANEOUS
  Administered 2022-05-25: 3 [IU] via SUBCUTANEOUS
  Administered 2022-05-27: 2 [IU] via SUBCUTANEOUS

## 2022-05-18 MED ORDER — SODIUM CHLORIDE 0.9% FLUSH
3.0000 mL | Freq: Two times a day (BID) | INTRAVENOUS | Status: DC
  Administered 2022-05-19 – 2022-05-25 (×6): 3 mL via INTRAVENOUS

## 2022-05-18 MED ORDER — BISACODYL 5 MG PO TBEC: 5.0000 mg | DELAYED_RELEASE_TABLET | Freq: Every day | ORAL | Status: DC | PRN

## 2022-05-18 MED ORDER — ATORVASTATIN CALCIUM 40 MG PO TABS
40.0000 mg | ORAL_TABLET | Freq: Every day | ORAL | Status: DC
Start: 2022-05-19 — End: 2022-05-23
  Administered 2022-05-19 – 2022-05-23 (×5): 40 mg via ORAL
  Filled 2022-05-18 (×5): qty 1

## 2022-05-18 MED ORDER — ONDANSETRON HCL 4 MG/2ML IJ SOLN: 4.0000 mg | Freq: Four times a day (QID) | INTRAMUSCULAR | Status: DC | PRN

## 2022-05-18 MED ORDER — ONDANSETRON HCL 4 MG PO TABS: 4.0000 mg | ORAL_TABLET | Freq: Four times a day (QID) | ORAL | Status: DC | PRN

## 2022-05-18 MED ORDER — ACETAMINOPHEN 325 MG PO TABS: 650.0000 mg | ORAL_TABLET | Freq: Four times a day (QID) | ORAL | Status: DC | PRN

## 2022-05-18 MED ORDER — TRAMADOL HCL 50 MG PO TABS: 50.0000 mg | ORAL_TABLET | ORAL | Status: DC | PRN

## 2022-05-18 MED ORDER — ISOSORBIDE MONONITRATE ER 30 MG PO TB24
30.0000 mg | ORAL_TABLET | Freq: Every day | ORAL | Status: DC
  Administered 2022-05-19 – 2022-05-20 (×2): 30 mg via ORAL
  Filled 2022-05-18 (×2): qty 1

## 2022-05-18 MED ORDER — ACETAMINOPHEN 650 MG RE SUPP: 650.0000 mg | Freq: Four times a day (QID) | RECTAL | Status: DC | PRN

## 2022-05-18 MED ORDER — DOCUSATE SODIUM 100 MG PO CAPS
100.0000 mg | ORAL_CAPSULE | Freq: Two times a day (BID) | ORAL | Status: DC
  Administered 2022-05-19 – 2022-05-25 (×9): 100 mg via ORAL
  Filled 2022-05-18 (×14): qty 1

## 2022-05-18 NOTE — ED Provider Triage Note (Signed)
Emergency Medicine Provider Triage Evaluation Note  [name] Goodman , a 81 y.o. male  was evaluated in triage.  Pt complains of shortness of breath. Patient is 10 days post-operative 5-vessel bypass. Had afib 3 days post-op and placed on eliquis. Patient's partner call son and said he was not feeling well today. States he is having shortness of breath. O2 sat at home was 87%. No on O2. Denies chest pain or palpitations. Has decreased appetite and "yellow" urine per son..  Review of Systems  Positive: See above Negative:   Physical Exam  BP 121/79 (BP Location: Right Arm)   Pulse 83   Temp 98.3 F (36.8 C) (Oral)   Resp 18   SpO2 93%  Gen:   Awake, no distress   Resp:  Increased work of breathing, O2 91% on room air.  MSK:   Moves extremities without difficulty Other:  In atrial fibrillation rate ~92 in triage   Medical Decision Making  Medically screening exam initiated at 2:23 PM.  Appropriate orders placed.  [name] Goodman was informed that the remainder of the evaluation will be completed by another provider, this initial triage assessment does not replace that evaluation, and the importance of remaining in the ED until their evaluation is complete.     Cristopher Peru, PA-C 05/18/22 1428

## 2022-05-18 NOTE — ED Notes (Signed)
MD notified of elevated trop

## 2022-05-18 NOTE — ED Provider Notes (Signed)
[facility] EMERGENCY DEPARTMENT Provider Note   CSN: [id number] Arrival date & time: 05/18/22  1327     History  Chief Complaint  Patient presents with   Hypoxia   Shortness of Breath    [name] Goodman is a 81 y.o. male.  The history is provided by the patient, medical records and a relative. No language interpreter was used.  Shortness of Breath Severity:  Moderate Onset quality:  Gradual Duration:  3 days Timing:  Intermittent Progression:  Waxing and waning Chronicity:  New Context: not URI   Relieved by:  Nothing Exacerbated by: positions. Ineffective treatments:  None tried Associated symptoms: no abdominal pain, no chest pain, no cough, no diaphoresis, no fever, no headaches, no neck pain, no sputum production, no vomiting and no wheezing   Associated symptoms comment:  Peripheral edema Risk factors: recent surgery   Risk factors: no hx of PE/DVT        Home Medications Prior to Admission medications   Medication Sig Start Date End Date Taking? Authorizing Provider  apixaban (ELIQUIS) 5 MG TABS tablet Take 1 tablet (5 mg total) by mouth 2 (two) times daily. 05/14/22   Barrett, Lodema Hong, PA-C  aspirin EC 81 MG tablet Take 81 mg by mouth daily.    [provider]  atorvastatin (LIPITOR) 40 MG tablet Take 1 tablet (40 mg total) by mouth daily. 05/15/22   Barrett, Erin R, PA-C  cyanocobalamin (VITAMIN B12) 1000 MCG tablet Take 1,000 mcg by mouth daily.    [provider]  diltiazem (TIAZAC) 240 MG 24 hr capsule Take 240 mg by mouth daily. 03/26/21   [provider]  fosinopril (MONOPRIL) 20 MG tablet Take 1 tablet (20 mg total) by mouth daily. 06/15/22   Barrett, Erin R, PA-C  hydrochlorothiazide (HYDRODIURIL) 25 MG tablet Take 1 tablet (25 mg total) by mouth daily. 06/15/22   Barrett, Erin R, PA-C  isosorbide mononitrate (IMDUR) 30 MG 24 hr tablet Take 1 tablet (30 mg total) by mouth daily. 05/15/22 06/14/22  Barrett, Erin  R, PA-C  metoprolol tartrate (LOPRESSOR) 25 MG tablet Take 1 tablet (25 mg total) by mouth 2 (two) times daily. 05/14/22   Barrett, Erin R, PA-C  traMADol (ULTRAM) 50 MG tablet Take 1 tablet (50 mg total) by mouth every 4 (four) hours as needed for moderate pain. 05/14/22   Barrett, Lodema Hong, PA-C      Allergies    Patient has no known allergies.    Review of Systems   Review of Systems  Constitutional:  Positive for fatigue. Negative for chills, diaphoresis and fever.  HENT:  Negative for congestion.   Eyes:  Negative for visual disturbance.  Respiratory:  Positive for chest tightness and shortness of breath. Negative for cough, sputum production and wheezing.   Cardiovascular:  Positive for leg swelling. Negative for chest pain and palpitations.  Gastrointestinal:  Negative for abdominal pain, constipation, diarrhea, nausea and vomiting.  Genitourinary:  Negative for flank pain.  Musculoskeletal:  Negative for back pain, neck pain and neck stiffness.  Skin:  Positive for wound.  Neurological:  Negative for dizziness, weakness, light-headedness and headaches.  Psychiatric/Behavioral:  Negative for agitation.     Physical Exam Updated Vital Signs BP 121/79 (BP Location: Right Arm)   Pulse 83   Temp 98.3 F (36.8 C) (Oral)   Resp 18   Ht 6\' 1"  (1.854 m)   Wt 96.2 kg   SpO2 93%   BMI 27.97  kg/m  Physical Exam Vitals and nursing note reviewed.  Constitutional:      General: He is not in acute distress.    Appearance: He is well-developed. He is not ill-appearing, toxic-appearing or diaphoretic.  HENT:     Head: Normocephalic and atraumatic.  Eyes:     Conjunctiva/sclera: Conjunctivae normal.  Cardiovascular:     Rate and Rhythm: Normal rate and regular rhythm.  Pulmonary:     Effort: Pulmonary effort is normal. No tachypnea or respiratory distress.     Breath sounds: Rhonchi and rales present.  Chest:     Chest wall: No tenderness.  Abdominal:     Palpations: Abdomen is  soft.     Tenderness: There is no abdominal tenderness.  Musculoskeletal:        General: No swelling.     Cervical back: Neck supple.     Right lower leg: No tenderness. Edema present.     Left lower leg: No tenderness. Edema present.  Skin:    General: Skin is warm and dry.     Capillary Refill: Capillary refill takes less than 2 seconds.     Findings: No erythema.  Neurological:     General: No focal deficit present.     Mental Status: He is alert.  Psychiatric:        Mood and Affect: Mood normal.     ED Results / Procedures / Treatments   Labs (all labs ordered are listed, but only abnormal results are displayed) Labs Reviewed  COMPREHENSIVE METABOLIC PANEL - Abnormal; Notable for the following components:      Result Value   Glucose, Bld 141 (*)    Creatinine, Ser 1.33 (*)    Calcium 8.4 (*)    Total Protein 6.0 (*)    Albumin 3.2 (*)    GFR, Estimated 54 (*)    All other components within normal limits  CBC WITH DIFFERENTIAL/PLATELET - Abnormal; Notable for the following components:   WBC 13.4 (*)    RBC 3.79 (*)    Hemoglobin 11.5 (*)    HCT 36.0 (*)    Neutro Abs 11.1 (*)    All other components within normal limits  BRAIN NATRIURETIC PEPTIDE - Abnormal; Notable for the following components:   B Natriuretic Peptide 961.0 (*)    All other components within normal limits  URINALYSIS, ROUTINE W REFLEX MICROSCOPIC - Abnormal; Notable for the following components:   Color, Urine AMBER (*)    APPearance HAZY (*)    Hgb urine dipstick SMALL (*)    Protein, ur 100 (*)    Leukocytes,Ua SMALL (*)    All other components within normal limits  TROPONIN I (HIGH SENSITIVITY) - Abnormal; Notable for the following components:   Troponin I (High Sensitivity) 277 (*)    All other components within normal limits  RESP PANEL BY RT-PCR (FLU A&B, COVID) ARPGX2  APTT  HEPARIN LEVEL (UNFRACTIONATED)  APTT  APTT  HEPARIN LEVEL (UNFRACTIONATED)  HEPARIN LEVEL (UNFRACTIONATED)   PROTIME-INR  TROPONIN I (HIGH SENSITIVITY)    EKG EKG Interpretation  Date/Time:  Sunday May 18 2022 14:23:42 EDT Ventricular Rate:  93 PR Interval:    QRS Duration: 90 QT Interval:  362 QTC Calculation: 450 R Axis:   74 Text Interpretation: Atrial fibrillation with premature ventricular or aberrantly conducted complexes Nonspecific ST and T wave abnormality Abnormal ECG When compared with ECG of 09-May-2022 06:43, PREVIOUS ECG IS PRESENT when compared to prior, now suspicious for afib. No  STEMI Confirmed by Antony Blackbird [phone]) on 05/18/2022 3:28:54 PM  Radiology CT Angio Chest PE W and/or Wo Contrast  Result Date: 05/18/2022 CLINICAL DATA:  Shortness of breath EXAM: CT ANGIOGRAPHY CHEST WITH CONTRAST TECHNIQUE: Multidetector CT imaging of the chest was performed using the standard protocol during bolus administration of intravenous contrast. Multiplanar CT image reconstructions and MIPs were obtained to evaluate the vascular anatomy. RADIATION DOSE REDUCTION: This exam was performed according to the departmental dose-optimization program which includes automated exposure control, adjustment of the mA and/or kV according to patient size and/or use of iterative reconstruction technique. CONTRAST:  57mL OMNIPAQUE IOHEXOL 350 MG/ML SOLN COMPARISON:  None Available. FINDINGS: Cardiovascular: Segmental and subsegmental pulmonary embolus is seen in the right middle lobe and right upper lobe. Cardiomegaly with no evidence of right heart strain, RV/LV ratio of 0.9. Trace pericardial effusion. Severe left main and three-vessel coronary artery calcifications status post CABG. Normal caliber thoracic aorta with mild calcified plaque. Mediastinum/Nodes: Esophagus and thyroid are unremarkable. Mild fat stranding of the anterior mediastinum. No pathologically enlarged lymph nodes seen in the chest. Lungs/Pleura: Central airways are patent. Bibasilar atelectasis. Mild ground-glass opacities which are most  pronounced in the upper lungs. Moderate right and large left pleural effusions. Near complete collapse of the left lower lobe. Upper Abdomen: Partially visualized simple appearing left renal cysts, further follow-up imaging is recommended Musculoskeletal: Prior median sternotomy with intact sternal wires. No acute osseous abnormality. Review of the MIP images confirms the above findings. IMPRESSION: 1. Segmental and subsegmental pulmonary embolus of the right middle lobe and right upper lobe. 2. Mild fat stranding of the anterior mediastinum, likely postsurgical changes related to recent CABG. No organized fluid collection 3. Mild pulmonary edema. 4. Moderate right and large left pleural effusions with near complete collapse of the left lower lobe. 5. Cardiomegaly and aortic Atherosclerosis (ICD10-I70.0). Critical Value/emergent results were called by telephone at the time of interpretation on 05/18/2022 at 5:08 pm to provider [facility] , who verbally acknowledged these results. Electronically Signed   By: Yetta Glassman M.D.   On: 05/18/2022 17:11   DG Chest Port 1 View  Result Date: 05/18/2022 CLINICAL DATA:  Shortness of breath, chest pain EXAM: PORTABLE CHEST 1 VIEW COMPARISON:  Portable exam 1529 hours compared to 05/13/2022 FINDINGS: Enlargement of cardiac silhouette post CABG. Mediastinal contours and pulmonary vascularity normal. Persistent atelectasis versus consolidation LEFT lower lobe. Small LEFT pleural effusion. Persistent accentuation of perihilar interstitial markings without acute infiltrate or pneumothorax. Bones demineralized. IMPRESSION: Enlargement of cardiac silhouette post CABG. Persistent LEFT lower lobe atelectasis versus consolidation with small associated LEFT pleural effusion. Electronically Signed   By: Lavonia Dana M.D.   On: 05/18/2022 15:44    Procedures Procedures    CRITICAL CARE Performed by: Gwenyth Allegra Hjalmer Iovino Total critical care time: 35 minutes Critical  care time was exclusive of separately billable procedures and treating other patients. Critical care was necessary to treat or prevent imminent or life-threatening deterioration. Critical care was time spent personally by me on the following activities: development of treatment plan with patient and/or surrogate as well as nursing, discussions with consultants, evaluation of patient's response to treatment, examination of patient, obtaining history from patient or surrogate, ordering and performing treatments and interventions, ordering and review of laboratory studies, ordering and review of radiographic studies, pulse oximetry and re-evaluation of patient's condition.   Medications Ordered in ED Medications - No data to display    ED Course/ Medical Decision Making/ A&P  Medical Decision Making Amount and/or Complexity of Data Reviewed Labs: ordered. Radiology: ordered.  Risk Prescription drug management. Decision regarding hospitalization.    [name] Goodman is a 81 y.o. male with a past medical history significant for CAD status post 5 vessel CABG 10 days with subsequent postoperative atrial fibrillation now on Eliquis who presents with worsening shortness of breath, dry cough, fatigue, peripheral edema, and now hypoxia at home.  According to patient, he has had some shortness of breath ever since he left the hospital but*developing a dry cough.  He reports no chest pain at all but feels short of breath especially when he lays flat.  He reports his legs are feeling tighter and edematous and he denies any history of blood clots.  He denies any chest wall discomfort or any drainage from his suture site.  Denies any rashes.  Denies abdominal pain, nausea, vomiting, constipation, diarrhea, or urinary changes.  He does not take any diuretics he reports.  Denies any other fevers or chills.  His surgery was by Dr. Dorris Fetch on 05/08/2022 (10 days ago).  On  exam, lungs have rales and rhonchi but otherwise did not have significant wheezing.  Chest was nontender.  There is a surgical wound that is well-appearing.  No drainage seen.  No erythema.  Intact pulses in extremities.  Legs are quite edematous bilaterally and symmetrically.  Oxygen saturations in the low 90s and family reports that it was in the 80s at home.  He was reportedly in the 80s on arrival to the emergency department as well.  EKG shows atrial fibrillation.  No STEMI.  Given the patient's symptoms I am somewhat concerning to rule out postoperative pneumonia, postoperative complication such as fluid or infection, pulmonary embolism, or new fluid overload.  With his worsening edema I am concerned about new heart failure in the setting of this new atrial fibrillation.  We will get screening labs, imaging, and ambulate with pulse oximetry.  X-ray in triage shows enlarged pericardial silhouette status post CABG and some atelectasis versus consolidation and effusion.  BNP is elevated at 961 but there is no prior to compare but clinically I suspect this is some new fluid overload.  He does have leukocytosis and mild anemia.  Troponin elevated at 277, will trend.  Metabolic panel shows no LFT elevation and electrolytes are overall reassuring.  Creatinine 1.33.  Urinalysis does not show convincing evidence of infection with no nitrites or bacteria and lack of urinary symptoms.  We will order a CT PE study to further evaluate, get a COVID test, and trend troponin.  Given the transient hypoxia at home, evidence of new fluid overload and his symptoms postoperatively, anticipate admission for further management.  5:09 PM Was just called by radiology to report that patient is positive for pulmonary embolism.  There is no heart strain and they do not see evidence of other postsurgical complication but there is large effusions.  As patient is on Eliquis and developed a pulmonary embolism with  intermittent hypoxia, elevated BNP, and elevated troponin, patient will be called for admission.  Medicine was called and will admit.  I spoke to CT surgery and they were made aware.  Anticipate they will see the patient in consultation given his recent CT surgery procedure 10 days ago.  Patient admitted for further management.        Final Clinical Impression(s) / ED Diagnoses Final diagnoses:  Hypoxia  Acute pulmonary embolism, unspecified pulmonary embolism type, unspecified whether acute cor pulmonale  present ([place])  Elevated troponin  Elevated brain natriuretic peptide (BNP) level  Pleural effusion  Shortness of breath     Clinical Impression: 1. Hypoxia   2. Acute pulmonary embolism, unspecified pulmonary embolism type, unspecified whether acute cor pulmonale present ([facility])   3. Elevated troponin   4. Elevated brain natriuretic peptide (BNP) level   5. Pleural effusion   6. Shortness of breath     Disposition: Admit  This note was prepared with assistance of Dragon voice recognition software. Occasional wrong-word or sound-a-like substitutions may have occurred due to the inherent limitations of voice recognition software.      Eldine Rencher, Gwenyth Allegra, MD 05/18/22 2004

## 2022-05-18 NOTE — H&P (Signed)
History and Physical    Patient: [name] Goodman [id number] DOB: 03-21-1941 DOA: 05/18/2022 DOS: the patient was seen and examined on 05/18/2022 PCP: Maryland Pink, MD  Patient coming from: Home - lives alone; NOK: Significant other, [place], [phone]   Chief Complaint: SOB  HPI: [name] Goodman is a 81 y.o. male with medical history significant of HTN and HLD presenting with SOB.  He was last hospitalized by CT surgery form 8/17-24 for CABG x 5.  He reports that he was doing reasonably well since hospital d/c but has had wheezing and increased WOB.  He felt extremely fatigued today and the SOB worsened so his friend checked his O2 sat and it was in the 80s so they came in.  He has also had LE edema.  He never had h/o afib prior to surgery but has been taking Eliquis since about 8/20.    ER Course:  5-v CABG, new afib post-op so taking Eliquis.  Here with SOB and has new PEs.  +LE edema, O2 sats in 80s at home.  No chest pain.  +PE, large pleural effusion, no heart strain.  Troponin elevated, looks wet.     Review of Systems: As mentioned in the history of present illness. All other systems reviewed and are negative. Past Medical History:  Diagnosis Date   Arthritis    History of kidney stones    Hyperlipidemia    Hypertension    Nocturia    S/P CABG (coronary artery bypass graft)    Past Surgical History:  Procedure Laterality Date   APPENDECTOMY     CATARACT EXTRACTION W/PHACO Right 11/27/2020   Procedure: CATARACT EXTRACTION PHACO AND INTRAOCULAR LENS PLACEMENT (IOC) RIGHT VIVITY LENS toric 6.31 00.41.8;  Surgeon: Birder Robson, MD;  Location: [place];  Service: Ophthalmology;  Laterality: Right;   CORONARY ARTERY BYPASS GRAFT N/A 05/08/2022   Procedure: CORONARY ARTERY BYPASS GRAFTING (CABG) TIMES FIVE USING ENDOSCOPICALLY HARVESTED RIGHT GREATER SAPHENOUS VEIN AND LEFT OPEN RADIAL HARVEST VEIN AND LEFT INTERNALLY MAMMARY ARTERY.;   Surgeon: Melrose Nakayama, MD;  Location: [place];  Service: Open Heart Surgery;  Laterality: N/A;   JOINT REPLACEMENT     KNEE ARTHROSCOPY Left    KNEE ARTHROSCOPY WITH LATERAL MENISECTOMY Right 11/13/2020   Procedure: Right knee arthroscopy, partial medial and lateral meniscectomy;  Surgeon: Hessie Knows, MD;  Location: [facility];  Service: Orthopedics;  Laterality: Right;   LEFT HEART CATH AND CORONARY ANGIOGRAPHY N/A 04/16/2022   Procedure: LEFT HEART CATH AND CORONARY ANGIOGRAPHY;  Surgeon: Isaias Cowman, MD;  Location: [facility];  Service: Cardiovascular;  Laterality: N/A;   POLYPECTOMY     WITH COLONOSCOPY   RADIAL ARTERY HARVEST Left 05/08/2022   Procedure: RADIAL ARTERY HARVEST;  Surgeon: Melrose Nakayama, MD;  Location: [place];  Service: Open Heart Surgery;  Laterality: Left;   TEE WITHOUT CARDIOVERSION N/A 05/08/2022   Procedure: TRANSESOPHAGEAL ECHOCARDIOGRAM (TEE);  Surgeon: Melrose Nakayama, MD;  Location: [place];  Service: Open Heart Surgery;  Laterality: N/A;   TOTAL KNEE ARTHROPLASTY Left 07/23/2015   Procedure: LEFT TOTAL KNEE ARTHROPLASTY;  Surgeon: Gaynelle Arabian, MD;  Location: [facility];  Service: Orthopedics;  Laterality: Left;   TOTAL KNEE ARTHROPLASTY Right 06/11/2021   Procedure: TOTAL KNEE ARTHROPLASTY;  Surgeon: Hessie Knows, MD;  Location: [facility];  Service: Orthopedics;  Laterality: Right;   TRANSURETHRAL RESECTION OF PROSTATE  ?2015   URETEROSCOPY WITH HOLMIUM LASER LITHOTRIPSY     Social  History:  reports that he quit smoking about 46 years ago. His smoking use included cigarettes. He has never used smokeless tobacco. He reports that he does not drink alcohol and does not use drugs.  No Known Allergies  Family History  Problem Relation Age of Onset   Hypertension Father    Cancer Father     Prior to Admission medications   Medication Sig Start Date End Date Taking? Authorizing Provider  apixaban (ELIQUIS) 5 MG TABS tablet Take 1  tablet (5 mg total) by mouth 2 (two) times daily. 05/14/22   Barrett, Lodema Hong, PA-C  aspirin EC 81 MG tablet Take 81 mg by mouth daily.    [provider]  atorvastatin (LIPITOR) 40 MG tablet Take 1 tablet (40 mg total) by mouth daily. 05/15/22   Barrett, Erin R, PA-C  cyanocobalamin (VITAMIN B12) 1000 MCG tablet Take 1,000 mcg by mouth daily.    [provider]  diltiazem (TIAZAC) 240 MG 24 hr capsule Take 240 mg by mouth daily. 03/26/21   [provider]  fosinopril (MONOPRIL) 20 MG tablet Take 1 tablet (20 mg total) by mouth daily. 06/15/22   Barrett, Erin R, PA-C  hydrochlorothiazide (HYDRODIURIL) 25 MG tablet Take 1 tablet (25 mg total) by mouth daily. 06/15/22   Barrett, Erin R, PA-C  isosorbide mononitrate (IMDUR) 30 MG 24 hr tablet Take 1 tablet (30 mg total) by mouth daily. 05/15/22 06/14/22  Barrett, Erin R, PA-C  metoprolol tartrate (LOPRESSOR) 25 MG tablet Take 1 tablet (25 mg total) by mouth 2 (two) times daily. 05/14/22   Barrett, Erin R, PA-C  traMADol (ULTRAM) 50 MG tablet Take 1 tablet (50 mg total) by mouth every 4 (four) hours as needed for moderate pain. 05/14/22   Barrett, Lodema Hong, PA-C    Physical Exam: Vitals:   05/18/22 1546 05/18/22 1600 05/18/22 1644 05/18/22 1700  BP: (!) 149/90 (!) 157/98 (!) 131/92 129/89  Pulse: 98 92 86 85  Resp: 20 (!) 30 18 (!) 23  Temp:      TempSrc:      SpO2: 97% 96% 96% 92%  Weight:      Height:       General:  Appears calm and comfortable and is in NAD, somewhat cantankerous Eyes:  PERRL, EOMI, normal lids, iris ENT:  grossly normal hearing, lips & tongue, mmm Neck:  no LAD, masses or thyromegaly Cardiovascular:  RRR, no m/r/g. 2-3+ LE edema. Healing sternotomy scar Respiratory:   CTA bilaterally with no wheezes/rales/rhonchi.  Mildly to moderately increased respiratory effort, varies depending on level of exertion. Abdomen:  soft, NT, ND Skin:  no rash or induration seen on limited exam; LUE incision is also healing  well Musculoskeletal:  grossly normal tone BUE/BLE, good ROM, no bony abnormality Psychiatric:  cantankerous mood and affect, speech fluent and appropriate, AOx3 Neurologic:  CN 2-12 grossly intact, moves all extremities in coordinated fashion   Radiological Exams on Admission: Independently reviewed - see discussion in A/P where applicable  CT Angio Chest PE W and/or Wo Contrast  Result Date: 05/18/2022 CLINICAL DATA:  Shortness of breath EXAM: CT ANGIOGRAPHY CHEST WITH CONTRAST TECHNIQUE: Multidetector CT imaging of the chest was performed using the standard protocol during bolus administration of intravenous contrast. Multiplanar CT image reconstructions and MIPs were obtained to evaluate the vascular anatomy. RADIATION DOSE REDUCTION: This exam was performed according to the departmental dose-optimization program which includes automated exposure control, adjustment of the mA and/or kV according to patient  size and/or use of iterative reconstruction technique. CONTRAST:  90mL OMNIPAQUE IOHEXOL 350 MG/ML SOLN COMPARISON:  None Available. FINDINGS: Cardiovascular: Segmental and subsegmental pulmonary embolus is seen in the right middle lobe and right upper lobe. Cardiomegaly with no evidence of right heart strain, RV/LV ratio of 0.9. Trace pericardial effusion. Severe left main and three-vessel coronary artery calcifications status post CABG. Normal caliber thoracic aorta with mild calcified plaque. Mediastinum/Nodes: Esophagus and thyroid are unremarkable. Mild fat stranding of the anterior mediastinum. No pathologically enlarged lymph nodes seen in the chest. Lungs/Pleura: Central airways are patent. Bibasilar atelectasis. Mild ground-glass opacities which are most pronounced in the upper lungs. Moderate right and large left pleural effusions. Near complete collapse of the left lower lobe. Upper Abdomen: Partially visualized simple appearing left renal cysts, further follow-up imaging is recommended  Musculoskeletal: Prior median sternotomy with intact sternal wires. No acute osseous abnormality. Review of the MIP images confirms the above findings. IMPRESSION: 1. Segmental and subsegmental pulmonary embolus of the right middle lobe and right upper lobe. 2. Mild fat stranding of the anterior mediastinum, likely postsurgical changes related to recent CABG. No organized fluid collection 3. Mild pulmonary edema. 4. Moderate right and large left pleural effusions with near complete collapse of the left lower lobe. 5. Cardiomegaly and aortic Atherosclerosis (ICD10-I70.0). Critical Value/emergent results were called by telephone at the time of interpretation on 05/18/2022 at 5:08 pm to provider [facility] , who verbally acknowledged these results. Electronically Signed   By: Allegra Lai M.D.   On: 05/18/2022 17:11   DG Chest Port 1 View  Result Date: 05/18/2022 CLINICAL DATA:  Shortness of breath, chest pain EXAM: PORTABLE CHEST 1 VIEW COMPARISON:  Portable exam 1529 hours compared to 05/13/2022 FINDINGS: Enlargement of cardiac silhouette post CABG. Mediastinal contours and pulmonary vascularity normal. Persistent atelectasis versus consolidation LEFT lower lobe. Small LEFT pleural effusion. Persistent accentuation of perihilar interstitial markings without acute infiltrate or pneumothorax. Bones demineralized. IMPRESSION: Enlargement of cardiac silhouette post CABG. Persistent LEFT lower lobe atelectasis versus consolidation with small associated LEFT pleural effusion. Electronically Signed   By: Ulyses Southward M.D.   On: 05/18/2022 15:44    EKG: Independently reviewed.  Afib with rate 93; nonspecific ST changes with no evidence of acute ischemia   Labs on Admission: I have personally reviewed the available labs and imaging studies at the time of the admission.  Pertinent labs:    Glucose 141 BUN 21/Creatinine 1.33/GFR 54 - stable BNP 961.0 HS troponin 277 WBC 13.4 Hgb 11.5 COVID/flu  negative UA: small Hgb, small LE, 100 protein   Assessment and Plan: Principal Problem:   Acute pulmonary embolism without acute cor pulmonale (HCC) Active Problems:   S/P CABG x 5   Pleural effusion due to CHF (congestive heart failure) (HCC)   Atrial fibrillation, chronic (HCC)   Essential hypertension   Dyslipidemia   Chronic systolic CHF (congestive heart failure) (HCC)    Acute PE -Patient without prior episodes of thromboembolic disease presenting with new PE -Patient is not showing evidence of hemodynamic instability at this time -Given his hemodynamic stability, his is at intermediate risk -PESI score is Class III, intermediate risk, indicating a 3.2-7.1% 30-day mortality risk -S-PESI score is high based on age alone -Will admit on telemetry -R heart strain was not seen on CT -Troponin and BNP as well as echo have been ordered to assess the severity of clot burden with R heart strain; it is difficult to know what to make of  his elevated troponin and BNP in the setting of recent CABG -With high PESI/S-PESI score and R heart strain OR elevated biomarkers, he is a low intermediate risk and is stable for monitoring on telemetry; IR should be consulted only if the patient is worsening or not improving. -Initiate anticoagulation - for now, will start treatment-dose heparin  -It is an interesting question about whether this would be considered Eliquis failure - would ask pharmacy and CT surgery to weigh in -Brigantine O2 as needed  Pleural effusions s/p CABG -Recent CABG -Has moderate to large B effusions -CT surgery requested to weigh in about this as well as about transitioning off heparin (back to Eliquis vs. Other) -Continue ASA, Imdur  Afib -Rate controlled with diltiazem and metoprolol -Transitioned to heparin for now  HTN -Continue diltiazem, metoprolol -He is due to start fosinopril, HCTZ in 1 month  HLD -Continue atorvastatin  Chronic systolic CHF -Intra-operative  TEE showed EF 40-45% -Current LE edema and pleural effusions may be associated with mild volume overload     Advance Care Planning:   Code Status: Full Code   Consults: CT surgery; pharmacy  DVT Prophylaxis: Heparin drip  Family Communication: Friend was present throughout evaluation  Severity of Illness: The appropriate patient status for this patient is OBSERVATION. Observation status is judged to be reasonable and necessary in order to provide the required intensity of service to ensure the patient's safety. The patient's presenting symptoms, physical exam findings, and initial radiographic and laboratory data in the context of their medical condition is felt to place them at decreased risk for further clinical deterioration. Furthermore, it is anticipated that the patient will be medically stable for discharge from the hospital within 2 midnights of admission.   Author: Jonah Blue, MD 05/18/2022 6:12 PM  For on call review [email or web address].

## 2022-05-18 NOTE — ED Notes (Signed)
Pt ambulated without any difficulty or increased work of breathing.  Sats improved to 100%.  MD aware.

## 2022-05-18 NOTE — Progress Notes (Signed)
ANTICOAGULATION CONSULT NOTE - Initial Consult  Pharmacy Consult for Heparin Indication:  CABG 10 days ago; on eliquis  No Known Allergies  Patient Measurements: Height: 6\' 1"  (185.4 cm) Weight: 96.2 kg (212 lb) IBW/kg (Calculated) : 79.9 Heparin Dosing Weight: 96.2 kg  Vital Signs: Temp: 98.3 F (36.8 C) (08/27 1419) Temp Source: Oral (08/27 1419) BP: 129/89 (08/27 1700) Pulse Rate: 85 (08/27 1700)  Labs: Recent Labs    05/18/22 1430  HGB 11.5*  HCT 36.0*  PLT 326  CREATININE 1.33*  TROPONINIHS 277*    Estimated Creatinine Clearance: 53.2 mL/min (A) (by C-G formula based on SCr of 1.33 mg/dL (H)).   Medical History: Past Medical History:  Diagnosis Date   Arthritis    History of kidney stones    Hyperlipidemia    Hypertension    Nocturia     Medications:  (Not in a hospital admission)  Scheduled:  Infusions:  PRN:   Assessment: 80 yom with a history of SOB, dry cough, fatigue, peripheral edema and hypoxia. Patient is presenting with SOB. Heparin per pharmacy consult placed for  CABG 10 days ago; on eliquis .  CT PE w/ Segmental and subsegmental pulmonary embolus of the right middle lobe and right upper lobe  Pt is s/p CABG on 8/17. Discharged on eliquis 5mg  BID for AF. Last dose 8/27 0700. Will require aPTT monitoring due to likely falsely high anti-Xa level secondary to DOAC use.  Hgb 11.5; plt 326 hsTrop 277  Goal of Therapy:  Heparin level 0.3-0.7 units/ml aPTT 66-102 seconds Monitor platelets by anticoagulation protocol: Yes   Plan:  No initial heparin bolus Start heparin infusion at 1600 units/hr Check aPTT & anti-Xa level at 0200 and daily while on heparin Continue to monitor via aPTT until levels are correlated Continue to monitor H&H and platelets  , PharmD, BCPS 05/18/2022 5:27 PM ED Clinical Pharmacist -  [phone]

## 2022-05-18 NOTE — ED Triage Notes (Signed)
Pt had open heart surgery 10 days ago. After surgery pt had A-fib and was placed on Eliquis. Pt's spo2 at home was 88-89% with shob and fatigue. Denies pain.

## 2022-05-19 ENCOUNTER — Observation Stay (HOSPITAL_COMMUNITY): Payer: PPO

## 2022-05-19 DIAGNOSIS — I10 Essential (primary) hypertension: Secondary | ICD-10-CM | POA: Diagnosis not present

## 2022-05-19 DIAGNOSIS — J9811 Atelectasis: Secondary | ICD-10-CM | POA: Diagnosis present

## 2022-05-19 DIAGNOSIS — R0602 Shortness of breath: Secondary | ICD-10-CM

## 2022-05-19 DIAGNOSIS — I482 Chronic atrial fibrillation, unspecified: Secondary | ICD-10-CM | POA: Diagnosis present

## 2022-05-19 DIAGNOSIS — I5021 Acute systolic (congestive) heart failure: Secondary | ICD-10-CM | POA: Diagnosis not present

## 2022-05-19 DIAGNOSIS — I25810 Atherosclerosis of coronary artery bypass graft(s) without angina pectoris: Secondary | ICD-10-CM | POA: Diagnosis present

## 2022-05-19 DIAGNOSIS — R0600 Dyspnea, unspecified: Secondary | ICD-10-CM | POA: Diagnosis not present

## 2022-05-19 DIAGNOSIS — E785 Hyperlipidemia, unspecified: Secondary | ICD-10-CM | POA: Diagnosis present

## 2022-05-19 DIAGNOSIS — J918 Pleural effusion in other conditions classified elsewhere: Secondary | ICD-10-CM | POA: Diagnosis present

## 2022-05-19 DIAGNOSIS — I2699 Other pulmonary embolism without acute cor pulmonale: Secondary | ICD-10-CM

## 2022-05-19 DIAGNOSIS — J90 Pleural effusion, not elsewhere classified: Secondary | ICD-10-CM | POA: Diagnosis not present

## 2022-05-19 DIAGNOSIS — R0603 Acute respiratory distress: Secondary | ICD-10-CM | POA: Diagnosis present

## 2022-05-19 DIAGNOSIS — Z96653 Presence of artificial knee joint, bilateral: Secondary | ICD-10-CM | POA: Diagnosis present

## 2022-05-19 DIAGNOSIS — Z87891 Personal history of nicotine dependence: Secondary | ICD-10-CM | POA: Diagnosis not present

## 2022-05-19 DIAGNOSIS — I2510 Atherosclerotic heart disease of native coronary artery without angina pectoris: Secondary | ICD-10-CM | POA: Diagnosis present

## 2022-05-19 DIAGNOSIS — Z20822 Contact with and (suspected) exposure to covid-19: Secondary | ICD-10-CM | POA: Diagnosis present

## 2022-05-19 DIAGNOSIS — I472 Ventricular tachycardia, unspecified: Secondary | ICD-10-CM | POA: Diagnosis present

## 2022-05-19 DIAGNOSIS — Z8249 Family history of ischemic heart disease and other diseases of the circulatory system: Secondary | ICD-10-CM | POA: Diagnosis not present

## 2022-05-19 DIAGNOSIS — I2693 Single subsegmental pulmonary embolism without acute cor pulmonale: Secondary | ICD-10-CM | POA: Diagnosis present

## 2022-05-19 DIAGNOSIS — E876 Hypokalemia: Secondary | ICD-10-CM | POA: Diagnosis not present

## 2022-05-19 DIAGNOSIS — J939 Pneumothorax, unspecified: Secondary | ICD-10-CM | POA: Diagnosis not present

## 2022-05-19 DIAGNOSIS — N189 Chronic kidney disease, unspecified: Secondary | ICD-10-CM | POA: Diagnosis not present

## 2022-05-19 DIAGNOSIS — Z79899 Other long term (current) drug therapy: Secondary | ICD-10-CM | POA: Diagnosis not present

## 2022-05-19 DIAGNOSIS — I255 Ischemic cardiomyopathy: Secondary | ICD-10-CM | POA: Diagnosis present

## 2022-05-19 DIAGNOSIS — Z87442 Personal history of urinary calculi: Secondary | ICD-10-CM | POA: Diagnosis not present

## 2022-05-19 DIAGNOSIS — N1831 Chronic kidney disease, stage 3a: Secondary | ICD-10-CM | POA: Diagnosis present

## 2022-05-19 DIAGNOSIS — I493 Ventricular premature depolarization: Secondary | ICD-10-CM | POA: Diagnosis present

## 2022-05-19 DIAGNOSIS — I130 Hypertensive heart and chronic kidney disease with heart failure and stage 1 through stage 4 chronic kidney disease, or unspecified chronic kidney disease: Secondary | ICD-10-CM | POA: Diagnosis present

## 2022-05-19 DIAGNOSIS — J9601 Acute respiratory failure with hypoxia: Secondary | ICD-10-CM | POA: Diagnosis present

## 2022-05-19 DIAGNOSIS — Z951 Presence of aortocoronary bypass graft: Secondary | ICD-10-CM | POA: Diagnosis not present

## 2022-05-19 DIAGNOSIS — I480 Paroxysmal atrial fibrillation: Secondary | ICD-10-CM | POA: Diagnosis not present

## 2022-05-19 DIAGNOSIS — I5022 Chronic systolic (congestive) heart failure: Secondary | ICD-10-CM | POA: Diagnosis not present

## 2022-05-19 DIAGNOSIS — N179 Acute kidney failure, unspecified: Secondary | ICD-10-CM | POA: Diagnosis present

## 2022-05-19 DIAGNOSIS — I509 Heart failure, unspecified: Secondary | ICD-10-CM | POA: Diagnosis not present

## 2022-05-19 DIAGNOSIS — I5023 Acute on chronic systolic (congestive) heart failure: Secondary | ICD-10-CM | POA: Diagnosis present

## 2022-05-19 HISTORY — PX: IR THORACENTESIS ASP PLEURAL SPACE W/IMG GUIDE: IMG5380

## 2022-05-19 LAB — CBC
HCT: 35.8 % — ABNORMAL LOW (ref 39.0–52.0)
Hemoglobin: 11.2 g/dL — ABNORMAL LOW (ref 13.0–17.0)
MCH: 29.7 pg (ref 26.0–34.0)
MCHC: 31.3 g/dL (ref 30.0–36.0)
MCV: 95 fL (ref 80.0–100.0)
Platelets: 297 10*3/uL (ref 150–400)
RBC: 3.77 MIL/uL — ABNORMAL LOW (ref 4.22–5.81)
RDW: 14.9 % (ref 11.5–15.5)
WBC: 12.3 10*3/uL — ABNORMAL HIGH (ref 4.0–10.5)
nRBC: 0 % (ref 0.0–0.2)

## 2022-05-19 LAB — ECHOCARDIOGRAM LIMITED
Height: 73 in
S' Lateral: 4.9 cm
Weight: 3657.87 oz

## 2022-05-19 LAB — APTT
aPTT: 73 seconds — ABNORMAL HIGH (ref 24–36)
aPTT: 69 seconds — ABNORMAL HIGH (ref 24–36)

## 2022-05-19 LAB — BASIC METABOLIC PANEL
Anion gap: 10 (ref 5–15)
BUN: 22 mg/dL (ref 8–23)
CO2: 25 mmol/L (ref 22–32)
Calcium: 8.5 mg/dL — ABNORMAL LOW (ref 8.9–10.3)
Chloride: 103 mmol/L (ref 98–111)
Creatinine, Ser: 1.47 mg/dL — ABNORMAL HIGH (ref 0.61–1.24)
GFR, Estimated: 48 mL/min — ABNORMAL LOW (ref >60)
Glucose, Bld: 147 mg/dL — ABNORMAL HIGH (ref 70–99)
Potassium: 4 mmol/L (ref 3.5–5.1)
Sodium: 138 mmol/L (ref 135–145)

## 2022-05-19 LAB — GLUCOSE, CAPILLARY
Glucose-Capillary: 133 mg/dL — ABNORMAL HIGH (ref 70–99)
Glucose-Capillary: 132 mg/dL — ABNORMAL HIGH (ref 70–99)
Glucose-Capillary: 99 mg/dL (ref 70–99)
Glucose-Capillary: 128 mg/dL — ABNORMAL HIGH (ref 70–99)

## 2022-05-19 LAB — HEPARIN LEVEL (UNFRACTIONATED): Heparin Unfractionated: >1.1 IU/mL — ABNORMAL HIGH (ref 0.30–0.70)

## 2022-05-19 MED ORDER — LIDOCAINE HCL 1 % IJ SOLN
INTRAMUSCULAR | Status: AC
  Administered 2022-05-19: 10 mL
  Filled 2022-05-19: qty 20

## 2022-05-19 MED ORDER — IPRATROPIUM-ALBUTEROL 0.5-2.5 (3) MG/3ML IN SOLN: 3.0000 mL | RESPIRATORY_TRACT | Status: DC | PRN

## 2022-05-19 MED ORDER — HYDRALAZINE HCL 20 MG/ML IJ SOLN: 10.0000 mg | INTRAMUSCULAR | Status: DC | PRN

## 2022-05-19 MED ORDER — ENSURE ENLIVE PO LIQD
237.0000 mL | Freq: Two times a day (BID) | ORAL | Status: DC
  Administered 2022-05-19 – 2022-05-28 (×4): 237 mL via ORAL

## 2022-05-19 MED ORDER — TRAZODONE HCL 50 MG PO TABS
50.0000 mg | ORAL_TABLET | Freq: Every evening | ORAL | Status: DC | PRN
  Administered 2022-05-27: 50 mg via ORAL
  Filled 2022-05-19: qty 1

## 2022-05-19 MED ORDER — ADULT MULTIVITAMIN W/MINERALS CH
1.0000 | ORAL_TABLET | Freq: Every day | ORAL | Status: DC
  Administered 2022-05-19 – 2022-05-28 (×10): 1 via ORAL
  Filled 2022-05-19 (×10): qty 1

## 2022-05-19 NOTE — Progress Notes (Signed)
PROGRESS NOTE    [name] Goodman  [id number] DOB: 1941-08-31 DOA: 05/18/2022 PCP: [place] Pink, MD   Brief Narrative:  81 y.o. male with medical history significant of HTN and HLD presenting with SOB.  He was last hospitalized by CT surgery form 8/17-24 for CABG x 5.  Postop also developed A-fib/20 taking Eliquis.  Now presents to the hospital with acute PE.   Assessment & Plan:  Principal Problem:   Acute pulmonary embolism without acute cor pulmonale (HCC) Active Problems:   S/P CABG x 5   Pleural effusion due to CHF (congestive heart failure) (HCC)   Atrial fibrillation, chronic (HCC)   Essential hypertension   Dyslipidemia   Chronic systolic CHF (congestive heart failure) (HCC)     Acute PE and right upper and middle lobe without cor pulmonale. Acute respiratory distress with hypoxia, 2 L nasal cannula Patient was already on short course of Eliquis prior to admission now developed PE.  I really do not consider this failure of Eliquis at this time as patient was also postop.  Echocardiogram ordered.  Continue heparin drip for now. Patient still tachycardic and hypoxic    Bilateral pleural effusions s/p CABG Status post CABG 8/17.  Echocardiogram pending.  Currently on Cardizem and Lopressor. Status post IR thoracentesis 1.6 L taken out 8/28.  Repeat chest x-ray tomorrow   Chronic atrial fibrillation On Cardizem and metoprolol.  IV heparin.   HTN Currently on Cardizem, Imdur and Lopressor.   HLD -Continue atorvastatin   Chronic systolic CHF -Intra-operative TEE showed EF 40-45% -Current LE edema and pleural effusions may be associated with mild volume overload     DVT prophylaxis: Heparin drip Code Status: Full code Family Communication: Sick nephric and other at bedside    Subjective: Patient was quite short of breath this morning requiring thoracentesis.  Thereafter he felt much better.   Examination:  General exam: Appears calm and comfortable   Respiratory system: Bibasilar crackles Cardiovascular system: S1 & S2 heard, RRR. No JVD, murmurs, rubs, gallops or clicks. No pedal edema. Gastrointestinal system: Abdomen is nondistended, soft and nontender. No organomegaly or masses felt. Normal bowel sounds heard. Central nervous system: Alert and oriented. No focal neurological deficits. Extremities: Symmetric 5 x 5 power. Skin: No rashes, lesions or ulcers Psychiatry: Judgement and insight appear normal. Mood & affect appropriate.     Objective: Vitals:   05/18/22 2100 05/18/22 2120 05/19/22 0227 05/19/22 0724  BP: (!) 153/141 (!) 133/105 (!) 148/94 (!) 139/106  Pulse: (!) 119 100 83 (!) 114  Resp: (!) 26 19 20 20   Temp:  98.8 F (37.1 C) 97.8 F (36.6 C) (!) 97.5 F (36.4 C)  TempSrc:  Oral Oral Oral  SpO2: 94%  94% 98%  Weight:  103.7 kg    Height:  6\' 1"  (1.854 m)      Intake/Output Summary (Last 24 hours) at 05/19/2022 0843 Last data filed at 05/19/2022 0725 Gross per 24 hour  Intake --  Output 125 ml  Net -125 ml   Filed Weights   05/18/22 1453 05/18/22 2120  Weight: 96.2 kg 103.7 kg     Data Reviewed:   CBC: Recent Labs  Lab 05/13/22 0737 05/18/22 1430 05/19/22 0223  WBC 10.1 13.4* 12.3*  NEUTROABS  --  11.1*  --   HGB 11.6* 11.5* 11.2*  HCT 34.6* 36.0* 35.8*  MCV 91.5 95.0 95.0  PLT 134* 326 [id number]   Basic Metabolic Panel: Recent Labs  Lab 05/13/22 0737 05/14/22  0321 05/18/22 1430 05/19/22 0223  NA 140 138 136 138  K 3.3* 3.5 4.6 4.0  CL 104 106 103 103  CO2 24 24 23 25   GLUCOSE 192* 138* 141* 147*  BUN 48* 36* 21 22  CREATININE 1.50* 1.23 1.33* 1.47*  CALCIUM 8.2* 7.9* 8.4* 8.5*   GFR: Estimated Creatinine Clearance: 49.8 mL/min (A) (by C-G formula based on SCr of 1.47 mg/dL (H)). Liver Function Tests: Recent Labs  Lab 05/18/22 1430  AST 19  ALT 33  ALKPHOS 67  BILITOT 1.1  PROT 6.0*  ALBUMIN 3.2*   No results for input(s): "LIPASE", "AMYLASE" in the last 168 hours. No  results for input(s): "AMMONIA" in the last 168 hours. Coagulation Profile: Recent Labs  Lab 05/18/22 1837  INR 1.8*   Cardiac Enzymes: No results for input(s): "CKTOTAL", "CKMB", "CKMBINDEX", "TROPONINI" in the last 168 hours. BNP (last 3 results) No results for input(s): "PROBNP" in the last 8760 hours. HbA1C: No results for input(s): "HGBA1C" in the last 72 hours. CBG: Recent Labs  Lab 05/13/22 1144 05/13/22 1613 05/13/22 2129 05/14/22 0614 05/14/22 1240  GLUCAP 138* 122* 160* 135* 189*   Lipid Profile: No results for input(s): "CHOL", "HDL", "LDLCALC", "TRIG", "CHOLHDL", "LDLDIRECT" in the last 72 hours. Thyroid Function Tests: No results for input(s): "TSH", "T4TOTAL", "FREET4", "T3FREE", "THYROIDAB" in the last 72 hours. Anemia Panel: No results for input(s): "VITAMINB12", "FOLATE", "FERRITIN", "TIBC", "IRON", "RETICCTPCT" in the last 72 hours. Sepsis Labs: No results for input(s): "PROCALCITON", "LATICACIDVEN" in the last 168 hours.  Recent Results (from the past 240 hour(s))  Resp Panel by RT-PCR (Flu A&B, Covid) Anterior Nasal Swab     Status: None   Collection Time: 05/18/22  2:57 PM   Specimen: Anterior Nasal Swab  Result Value Ref Range Status   SARS Coronavirus 2 by RT PCR NEGATIVE NEGATIVE Final    Comment: (NOTE) SARS-CoV-2 target nucleic acids are NOT DETECTED.  The SARS-CoV-2 RNA is generally detectable in upper respiratory specimens during the acute phase of infection. The lowest concentration of SARS-CoV-2 viral copies this assay can detect is 138 copies/mL. A negative result does not preclude SARS-Cov-2 infection and should not be used as the sole basis for treatment or other patient management decisions. A negative result may occur with  improper specimen collection/handling, submission of specimen other than nasopharyngeal swab, presence of viral mutation(s) within the areas targeted by this assay, and inadequate number of viral copies(<138  copies/mL). A negative result must be combined with clinical observations, patient history, and epidemiological information. The expected result is Negative.  Fact Sheet for Patients:  05/20/22  Fact Sheet for Healthcare Providers:  BloggerCourse.com  This test is no t yet approved or cleared by the [employer] FDA and  has been authorized for detection and/or diagnosis of SARS-CoV-2 by FDA under an Emergency Use Authorization (EUA). This EUA will remain  in effect (meaning this test can be used) for the duration of the COVID-19 declaration under Section 564(b)(1) of the Act, 21 U.S.C.section 360bbb-3(b)(1), unless the authorization is terminated  or revoked sooner.       Influenza A by PCR NEGATIVE NEGATIVE Final   Influenza B by PCR NEGATIVE NEGATIVE Final    Comment: (NOTE) The Xpert Xpress SARS-CoV-2/FLU/RSV plus assay is intended as an aid in the diagnosis of influenza from Nasopharyngeal swab specimens and should not be used as a sole basis for treatment. Nasal washings and aspirates are unacceptable for Xpert Xpress SARS-CoV-2/FLU/RSV testing.  Fact  Sheet for Patients: EntrepreneurPulse.com.au  Fact Sheet for Healthcare Providers: IncredibleEmployment.be  This test is not yet approved or cleared by the [place] FDA and has been authorized for detection and/or diagnosis of SARS-CoV-2 by FDA under an Emergency Use Authorization (EUA). This EUA will remain in effect (meaning this test can be used) for the duration of the COVID-19 declaration under Section 564(b)(1) of the Act, 21 U.S.C. section 360bbb-3(b)(1), unless the authorization is terminated or revoked.  Performed at [facility], [place] [address] [place], [place] [address]          Radiology Studies: CT Angio Chest PE W and/or Wo Contrast  Result Date: 05/18/2022 CLINICAL DATA:  Shortness of breath  EXAM: CT ANGIOGRAPHY CHEST WITH CONTRAST TECHNIQUE: Multidetector CT imaging of the chest was performed using the standard protocol during bolus administration of intravenous contrast. Multiplanar CT image reconstructions and MIPs were obtained to evaluate the vascular anatomy. RADIATION DOSE REDUCTION: This exam was performed according to the departmental dose-optimization program which includes automated exposure control, adjustment of the mA and/or kV according to patient size and/or use of iterative reconstruction technique. CONTRAST:  60mL OMNIPAQUE IOHEXOL 350 MG/ML SOLN COMPARISON:  None Available. FINDINGS: Cardiovascular: Segmental and subsegmental pulmonary embolus is seen in the right middle lobe and right upper lobe. Cardiomegaly with no evidence of right heart strain, RV/LV ratio of 0.9. Trace pericardial effusion. Severe left main and three-vessel coronary artery calcifications status post CABG. Normal caliber thoracic aorta with mild calcified plaque. Mediastinum/Nodes: Esophagus and thyroid are unremarkable. Mild fat stranding of the anterior mediastinum. No pathologically enlarged lymph nodes seen in the chest. Lungs/Pleura: Central airways are patent. Bibasilar atelectasis. Mild ground-glass opacities which are most pronounced in the upper lungs. Moderate right and large left pleural effusions. Near complete collapse of the left lower lobe. Upper Abdomen: Partially visualized simple appearing left renal cysts, further follow-up imaging is recommended Musculoskeletal: Prior median sternotomy with intact sternal wires. No acute osseous abnormality. Review of the MIP images confirms the above findings. IMPRESSION: 1. Segmental and subsegmental pulmonary embolus of the right middle lobe and right upper lobe. 2. Mild fat stranding of the anterior mediastinum, likely postsurgical changes related to recent CABG. No organized fluid collection 3. Mild pulmonary edema. 4. Moderate right and large left  pleural effusions with near complete collapse of the left lower lobe. 5. Cardiomegaly and aortic Atherosclerosis (ICD10-I70.0). Critical Value/emergent results were called by telephone at the time of interpretation on 05/18/2022 at 5:08 pm to provider [facility] , who verbally acknowledged these results. Electronically Signed   By: Yetta Glassman M.D.   On: 05/18/2022 17:11   DG Chest Port 1 View  Result Date: 05/18/2022 CLINICAL DATA:  Shortness of breath, chest pain EXAM: PORTABLE CHEST 1 VIEW COMPARISON:  Portable exam 1529 hours compared to 05/13/2022 FINDINGS: Enlargement of cardiac silhouette post CABG. Mediastinal contours and pulmonary vascularity normal. Persistent atelectasis versus consolidation LEFT lower lobe. Small LEFT pleural effusion. Persistent accentuation of perihilar interstitial markings without acute infiltrate or pneumothorax. Bones demineralized. IMPRESSION: Enlargement of cardiac silhouette post CABG. Persistent LEFT lower lobe atelectasis versus consolidation with small associated LEFT pleural effusion. Electronically Signed   By: Lavonia Dana M.D.   On: 05/18/2022 15:44        Scheduled Meds:  aspirin EC  81 mg Oral Daily   atorvastatin  40 mg Oral Daily   diltiazem  240 mg Oral Daily   docusate sodium  100 mg Oral BID  insulin aspart  0-15 Units Subcutaneous TID WC   isosorbide mononitrate  30 mg Oral Daily   lidocaine       metoprolol tartrate  25 mg Oral BID   sodium chloride flush  3 mL Intravenous Q12H   Continuous Infusions:  heparin 1,600 Units/hr (05/18/22 1859)     LOS: 0 days   Time spent= 35 mins    Charli Halle Joline Maxcy, MD [facility]  If 7PM-7AM, please contact night-coverage  05/19/2022, 8:43 AM

## 2022-05-19 NOTE — Progress Notes (Signed)
ANTICOAGULATION CONSULT NOTE  Pharmacy Consult for Heparin Indication: PE Brief A/P: aPTT within goal range Continue Heparin at current rate   No Known Allergies  Patient Measurements: Height: 6\' 1"  (185.4 cm) Weight: 103.7 kg (228 lb 9.9 oz) IBW/kg (Calculated) : 79.9 Heparin Dosing Weight: 96.2 kg  Vital Signs: Temp: 97.8 F (36.6 C) (08/28 0227) Temp Source: Oral (08/28 0227) BP: 148/94 (08/28 0227) Pulse Rate: 83 (08/28 0227)  Labs: Recent Labs    05/18/22 1430 05/18/22 1722 05/18/22 1837 05/19/22 0223  HGB 11.5*  --   --  11.2*  HCT 36.0*  --   --  35.8*  PLT 326  --   --  297  APTT  --   --  36 73*  LABPROT  --   --  20.9*  --   INR  --   --  1.8*  --   HEPARINUNFRC  --   --  >1.10* >1.10*  CREATININE 1.33*  --   --  1.47*  TROPONINIHS 277* 255*  --   --      Estimated Creatinine Clearance: 49.8 mL/min (A) (by C-G formula based on SCr of 1.47 mg/dL (H)).   Assessment: 81 y.o. male with PE, h/o Afib and Eliquis on hold, for heparin.    Goal of Therapy:  Heparin level 0.3-0.7 units/ml aPTT 66-102 seconds Monitor platelets by anticoagulation protocol: Yes   Plan:  Continue Heparin at current rate   94, PharmD, BCPS

## 2022-05-19 NOTE — Progress Notes (Signed)
OT Cancellation Note  Patient Details Name: [name] Goodman MRN: [id number] DOB: April 26, 1941   Cancelled Treatment:    Reason Eval/Treat Not Completed: Other (comment)--received chat text from PT that patient requests no mobility til post thoracentesis due to SOB at rest currently.   Ignacia Palma, OTR/L [facility] [phone] Office [phone]    Evette Georges 05/19/2022, 7:32 AM

## 2022-05-19 NOTE — Progress Notes (Signed)
ANTICOAGULATION CONSULT NOTE  Pharmacy Consult for Heparin Indication: New PE (CABG 04/2022 discharged on Eliquis for Afib)  No Known Allergies  Patient Measurements: Height: 6\' 1"  (185.4 cm) Weight: 103.7 kg (228 lb 9.9 oz) IBW/kg (Calculated) : 79.9 Heparin Dosing Weight: 96.2 kg  Vital Signs: Temp: 97.5 F (36.4 C) (08/28 0724) Temp Source: Oral (08/28 0724) BP: 130/75 (08/28 1033) Pulse Rate: 103 (08/28 1003)  Labs: Recent Labs    05/18/22 1430 05/18/22 1722 05/18/22 1837 05/19/22 0223 05/19/22 1014  HGB 11.5*  --   --  11.2*  --   HCT 36.0*  --   --  35.8*  --   PLT 326  --   --  297  --   APTT  --   --  36 73* 69*  LABPROT  --   --  20.9*  --   --   INR  --   --  1.8*  --   --   HEPARINUNFRC  --   --  >1.10* >1.10*  --   CREATININE 1.33*  --   --  1.47*  --   TROPONINIHS 277* 255*  --   --   --      Estimated Creatinine Clearance: 49.8 mL/min (A) (by C-G formula based on SCr of 1.47 mg/dL (H)).   Medical History: Past Medical History:  Diagnosis Date   Arthritis    History of kidney stones    Hyperlipidemia    Hypertension    Nocturia    S/P CABG (coronary artery bypass graft)     Medications:  Medications Prior to Admission  Medication Sig Dispense Refill Last Dose   apixaban (ELIQUIS) 5 MG TABS tablet Take 1 tablet (5 mg total) by mouth 2 (two) times daily. 60 tablet 3 05/18/2022 at 7:00am   aspirin EC 81 MG tablet Take 81 mg by mouth daily.   05/18/2022   atorvastatin (LIPITOR) 40 MG tablet Take 1 tablet (40 mg total) by mouth daily. 30 tablet 3 05/18/2022   cyanocobalamin (VITAMIN B12) 1000 MCG tablet Take 1,000 mcg by mouth daily.   05/18/2022   diltiazem (TIAZAC) 240 MG 24 hr capsule Take 240 mg by mouth daily.   05/18/2022   [START ON 06/15/2022] fosinopril (MONOPRIL) 20 MG tablet Take 1 tablet (20 mg total) by mouth daily.   05/18/2022   [START ON 06/15/2022] hydrochlorothiazide (HYDRODIURIL) 25 MG tablet Take 1 tablet (25 mg total) by mouth daily.    05/18/2022   isosorbide mononitrate (IMDUR) 30 MG 24 hr tablet Take 1 tablet (30 mg total) by mouth daily. 30 tablet 0 05/18/2022   metoprolol tartrate (LOPRESSOR) 25 MG tablet Take 1 tablet (25 mg total) by mouth 2 (two) times daily. 60 tablet 3 05/18/2022 at 7:00am   traMADol (ULTRAM) 50 MG tablet Take 1 tablet (50 mg total) by mouth every 4 (four) hours as needed for moderate pain. 30 tablet 0 Past Week    Assessment: 58 yom with a history of SOB, dry cough, fatigue, peripheral edema and hypoxia. Patient is presenting with SOB. Heparin per pharmacy consult placed for new PE.  Pt is s/p CABG on 8/17. Discharged on eliquis 5mg  BID for AF. Last dose 8/27 0700. Will require aPTT monitoring due to likely falsely high anti-Xa level secondary to DOAC use.  Heparin level remains >1.1 units/mL due to recent Eliquis use. aPTT thjs morning 73 with repeat 69 (both therapeutic) while on heparin 1600 units/hr.  Goal of Therapy:  Heparin level  0.3-0.7 units/ml aPTT 66-102 seconds Monitor platelets by anticoagulation protocol: Yes   Plan:  Continue heparin 1600 units/hr Anticipate transition to DOAC after IR procedure  Daily heparin level, aPTT, CBC Continue to monitor via aPTT until levels are correlated Continue to monitor H&H and platelets  Eldridge Scot, PharmD 05/19/2022 11:25 AM

## 2022-05-19 NOTE — Progress Notes (Signed)
Initial Nutrition Assessment  DOCUMENTATION CODES:   Not applicable  INTERVENTION:  Liberalize diet from a heart healthy/carb modified to a 2 gram sodium diet to provide wider variety of menu options to enhance nutritional adequacy Ensure Enlive po BID, each supplement provides 350 kcal and 20 grams of protein. MVI with minerals daily  NUTRITION DIAGNOSIS:   Increased nutrient needs related to acute illness, post-op healing as evidenced by estimated needs  GOAL:   Patient will meet greater than or equal to 90% of their needs  MONITOR:   PO intake, Supplement acceptance, Diet advancement, Labs, Weight trends, I & O's  REASON FOR ASSESSMENT:   Consult Other (Comment) (nutrition goals)  ASSESSMENT:   Pt admitted from home with SOB r/t acute pulmonary embolism without acute cor pulmonale. PMH significant for HTN and HLD. Recently hospitalized 8/17-8/24 for CABG x5.   8/28: s/p thoracentesis-yield 1.6L  Pt off unit for EEG. Spoke with his significant other present in the room. She states that since his CABG he has been eating fair. She states that he has been eating about 2 small meals per day including potato soup and sandwiches. He had been reporting early satiety which is likely r/t pleural effusion. She states that he ate really well for breakfast this morning which included an omelette. Suspects his appetite has increased some since having a thoracentesis.   She states that his weight overall had remained stable. Given the pleural effusion she believes his weight may have increased slightly however denies significant recent weight loss. Per review of weight history his weight appeared to have remained stable around 96 kg until 08/23. His weight is +7.6 kg from 08/16-08/27.  Pt would benefit from a liberalized diet given blood sugars are controlled and pt's increased need for post-operative healing.   Edema: non-pitting  Medications: colace, SSI 0-15 units TID  Labs: Cr  1.47, GFR 48, HgbA1c 6.3%, CBG 133  NUTRITION - FOCUSED PHYSICAL EXAM: Pt off unit. Deferred to follow up.   Diet Order:   Diet Order             Diet 2 gram sodium Room service appropriate? Yes; Fluid consistency: Thin  Diet effective now                   EDUCATION NEEDS:   No education needs have been identified at this time  Skin:  Skin Assessment: Reviewed RN Assessment  Last BM:  8/27  Height:   Ht Readings from Last 1 Encounters:  05/18/22 6\' 1"  (1.854 m)    Weight:   Wt Readings from Last 1 Encounters:  05/18/22 103.7 kg   BMI:  Body mass index is 30.16 kg/m.  Estimated Nutritional Needs:   Kcal:  2300-2500  Protein:  125-140g  Fluid:  >/=2L  05/20/22, RDN, LDN Clinical Nutrition

## 2022-05-19 NOTE — Progress Notes (Signed)
PT Cancellation Note  Patient Details Name: [name] Goodman MRN: [id number] DOB: 02-28-1941   Cancelled Treatment:    Reason Eval/Treat Not Completed: Patient declined, no reason specified (pt with HR 121, SpO2 98% on 2L, pt states 2/4 SOB and denies any activity at this time and requests no mobility til post thoracentesis)   Savreen Gebhardt B Cleon Signorelli 05/19/2022, 7:29 AM [name], PT Acute Rehabilitation Services Office: [phone]

## 2022-05-19 NOTE — Progress Notes (Signed)
Patient returned from thoracentesis procedure. Vitals obtained and stable. Patient states "having an easier time breathing".  [name] Goodman

## 2022-05-19 NOTE — Progress Notes (Addendum)
      [address]       [place] [address]             [phone]       Subjective:   Patient known to [facility].  He is S/p CABG x 5 performed by Dr.  Dorris Fetch on 8/17, discharged home 8/24.  Presented to ED on 8/27 with complaints of  shortness of breath and fatigue, with decrease O2 sats on monitor.. found to have PE and pleural effusions  Objective: Vital signs in last 24 hours: Temp:  [97.8 F (36.6 C)-98.8 F (37.1 C)] 97.8 F (36.6 C) (08/28 0227) Pulse Rate:  [48-119] 83 (08/28 0227) Cardiac Rhythm: Atrial fibrillation (08/27 2236) Resp:  [17-30] 20 (08/28 0227) BP: (121-157)/(79-141) 148/94 (08/28 0227) SpO2:  [92 %-97 %] 94 % (08/28 0227) Weight:  [96.2 kg-103.7 kg] 103.7 kg (08/27 2120)  General appearance: alert, cooperative, and no distress Heart: irregular rate and rhythm Lungs: diminished breath sounds bibasilar Abdomen: soft, non-tender; bowel sounds normal; no masses,  no organomegaly Extremities: edema + pitting Wound: clean and dry  Lab Results: Recent Labs    05/18/22 1430 05/19/22 0223  WBC 13.4* 12.3*  HGB 11.5* 11.2*  HCT 36.0* 35.8*  PLT 326 297   BMET:  Recent Labs    05/18/22 1430 05/19/22 0223  NA 136 138  K 4.6 4.0  CL 103 103  CO2 23 25  GLUCOSE 141* 147*  BUN 21 22  CREATININE 1.33* 1.47*  CALCIUM 8.4* 8.5*    PT/INR:  Recent Labs    05/18/22 1837  LABPROT 20.9*  INR 1.8*   ABG    Component Value Date/Time   PHART 7.362 05/09/2022 0009   HCO3 22.8 05/09/2022 0009   TCO2 24 05/09/2022 0009   ACIDBASEDEF 2.0 05/09/2022 0009   O2SAT 97 05/09/2022 0009   CBG (last 3)  No results for input(s): "GLUCAP" in the last 72 hours.  Assessment/Plan:  S/P CABG x 5   8/17.. Echocardiogram pending, rate controlled Atrial Fibrillation-- on Cardizem, Lopressor Pulmonary Embolism- was discharged on Eliquis for Atrial Fibrillation, can resume anticoagulation after Thoracentesis performed Pulm- CT scan with  bilateral pleural effusions, IR consult for Thoracentesis Renal- volume overloaded on exam, creatinine at 1.47, Lasix daily as creatinine allows Dispo- we will follow along, care per medicine   LOS: 0 days    Lowella Dandy, PA-C 05/19/2022

## 2022-05-19 NOTE — Evaluation (Signed)
Occupational Therapy Evaluation and Discharge Patient Details Name: [name] Goodman MRN: [id number] DOB: 07-Jun-1941 Today's Date: 05/19/2022   History of Present Illness 81 yo male admitted 8/27 with SOB due to PE and pleural effusion. Pt with CABG x 5 8/17 and DC 8/24. PMHx: HTN, HLD, BPH, Lt TKA, CHF, CAD, AFib   Clinical Impression   This 81 yo male admitted with above presents to acute OT close to his baseline level of functioning from when he D/C'd from acute OT post CABG surgery recently. He has A prn at home and is not in any need for DME. No further OT needs, we will sign off.     Recommendations for follow up therapy are one component of a multi-disciplinary discharge planning process, led by the attending physician.  Recommendations may be updated based on patient status, additional functional criteria and insurance authorization.   Follow Up Recommendations  No OT follow up    Assistance Recommended at Discharge PRN  Patient can return home with the following A little help with bathing/dressing/bathroom;Assistance with cooking/housework;Assist for transportation    Functional Status Assessment  Patient has had a recent decline in their functional status and demonstrates the ability to make significant improvements in function in a reasonable and predictable amount of time. (but does not require any more skilled OT services)  Equipment Recommendations  None recommended by OT       Precautions / Restrictions Precautions Precautions: Sternal Precaution Booklet Issued: No Precaution Comments: Pt was well versed with precautions without handout      Mobility Bed Mobility Overal bed mobility: Needs Assistance Bed Mobility: Sit to Supine       Sit to supine: Min assist   General bed mobility comments: A for legs  "I can do it, but it's easier if you help me with my legs"    Transfers Overall transfer level: Needs assistance   Transfers: Sit to/from Stand Sit  to Stand: Supervision           General transfer comment: Pt recalled proper hand placement      Balance Overall balance assessment: Mild deficits observed, not formally tested                                         ADL either performed or assessed with clinical judgement   ADL Overall ADL's : Needs assistance/impaired Eating/Feeding: Independent;Sitting   Grooming: Supervision/safety;Standing   Upper Body Bathing: Set up;Sitting   Lower Body Bathing: Minimal assistance Lower Body Bathing Details (indicate cue type and reason): S sit<>stand Upper Body Dressing : Moderate assistance;Sitting   Lower Body Dressing: Minimal assistance Lower Body Dressing Details (indicate cue type and reason): S sit<>stand Toilet Transfer: Supervision/safety;Ambulation;Rolling walker (2 wheels) Toilet Transfer Details (indicate cue type and reason): simulated recliner>up and down hallway>to bed   Toileting - Clothing Manipulation Details (indicate cue type and reason): pt reports he has a toilet aid at home to help with peri care              Pertinent Vitals/Pain Pain Assessment Pain Assessment: No/denies pain     Hand Dominance Right   Extremity/Trunk Assessment Upper Extremity Assessment Upper Extremity Assessment: Overall WFL for tasks assessed           Communication Communication Communication: No difficulties   Cognition Arousal/Alertness: Awake/alert Behavior During Therapy: WFL for tasks assessed/performed Overall Cognitive Status: Within  Functional Limits for tasks assessed                                 General Comments: Pt frustrated due to how well he did post CABG surgery and he was doing everything the surgeon had told him to do at home and he ended up back in hospital with other medical issues                Home Living Family/patient expects to be discharged to:: Private residence Living Arrangements:  Spouse/significant other Available Help at Discharge: Friend(s);Available 24 hours/day Type of Home: House Home Access: Stairs to enter [employer] of Steps: 3 Entrance Stairs-Rails: Left Home Layout: One level     Bathroom Shower/Tub: Chief Strategy Officer: Handicapped height     Home Equipment: Agricultural consultant (2 wheels);Cane - single point          Prior Functioning/Environment Prior Level of Function : Needs assist (since recent CABG surgery)       Physical Assist : ADLs (physical);Mobility (physical) Mobility (physical): Transfers ADLs (physical): Bathing;Dressing Mobility Comments: a little help to stand from low chairs since CABG ADLs Comments: Reports significant other puts shirt on his arms and over his head as well as putting his socks on for him        OT Problem List: Cardiopulmonary status limiting activity         OT Goals(Current goals can be found in the care plan section) Acute Rehab OT Goals Patient Stated Goal: to not have so many new medical issues      AM-PAC OT "6 Clicks" Daily Activity     Outcome Measure Help from another person eating meals?: None Help from another person taking care of personal grooming?: A Little Help from another person toileting, which includes using toliet, bedpan, or urinal?: A Little Help from another person bathing (including washing, rinsing, drying)?: A Little Help from another person to put on and taking off regular upper body clothing?: A Lot Help from another person to put on and taking off regular lower body clothing?: A Little 6 Click Score: 18   End of Session Equipment Utilized During Treatment: Gait belt;Rolling walker (2 wheels) Nurse Communication:  (pt with wheezing when we originally got back from him walking--dissipated in ~5 minutnes)  Activity Tolerance: Patient tolerated treatment well Patient left: in bed;with bed alarm set  OT Visit Diagnosis: Muscle weakness  (generalized) (M62.81)                Time: 3810-1751 OT Time Calculation (min): 22 min Charges:  OT General Charges $OT Visit: 1 Visit OT Evaluation $OT Eval Moderate Complexity: 1 Mod [name] Goodman, OTR/L Acute Rehab Services [facility] [phone] Office [phone]    [name] Goodman 05/19/2022, 7:16 PM

## 2022-05-19 NOTE — Progress Notes (Signed)
PT Cancellation Note  Patient Details Name: [name] Goodman MRN: [id number] DOB: Jan 24, 1941   Cancelled Treatment:    Reason Eval/Treat Not Completed: Patient at procedure or test/unavailable (reattempted post thoracentesis but pt leaving room for Echo)   [name] [name] 05/19/2022, 11:06 AM [name], PT Acute Rehabilitation Services Office: [phone]

## 2022-05-19 NOTE — Procedures (Signed)
PROCEDURE SUMMARY:  Successful US guided left thoracentesis. Yielded 1.6 L of blood-tinged fluid. Pt tolerated procedure well. No immediate complications.  Specimen not sent for labs. CXR ordered; no post-procedure pneumothorax identified  EBL < 2 mL  Mickie Kay, NP 05/19/2022 9:47 AM

## 2022-05-19 NOTE — Progress Notes (Signed)
  Echocardiogram Limited 2D Echocardiogram has been performed.  [name] Goodman 05/19/2022, 12:02 PM

## 2022-05-19 NOTE — Evaluation (Addendum)
Physical Therapy Evaluation/ Discharge Patient Details Name: [name] Goodman MRN: [id number] DOB: 1941-08-10 Today's Date: 05/19/2022  History of Present Illness  81 yo male admitted 8/27 with SOB due to PE and pleural effusion. Pt with CABG x 5 8/17 and DC 8/24. 8/28 thoracentesis. PMHx: HTN, HLD, BPH, Lt TKA, CHF, CAD, AFib  Clinical Impression  Pt pleasant stating breathing significantly improved since thoracentesis  today and able to walk on RA. Pt able to state sternal precautions, walking program and progression and states he has had trouble at home getting out of chairs due to lower surface. Pt and significant other request HHPT. Pt with with decreased balance and activity tolerance who will benefit from HHPT but will defer to HHPT acutely as well as cardiac rehab and mobility specialists. Pt agreeable, will sign off.   92-98% on RA HR 104-117 BP 109/67     Recommendations for follow up therapy are one component of a multi-disciplinary discharge planning process, led by the attending physician.  Recommendations may be updated based on patient status, additional functional criteria and insurance authorization.  Follow Up Recommendations Home health PT      Assistance Recommended at Discharge PRN  Patient can return home with the following  A little help with bathing/dressing/bathroom;Assistance with cooking/housework;Assist for transportation;A little help with walking and/or transfers    Equipment Recommendations None recommended by PT  Recommendations for Other Services       Functional Status Assessment Patient has had a recent decline in their functional status and/or demonstrates limited ability to make significant improvements in function in a reasonable and predictable amount of time     Precautions / Restrictions Precautions Precautions: Sternal Precaution Booklet Issued: No      Mobility  Bed Mobility Overal bed mobility: Modified Independent              General bed mobility comments: cues to maintain precautions with scooting    Transfers Overall transfer level: Needs assistance   Transfers: Sit to/from Stand Sit to Stand: Supervision           General transfer comment: cues for hand placement, pt with good hand placement to stand but lack of descent control to surface    Ambulation/Gait Ambulation/Gait assistance: Supervision Gait Distance (Feet): 400 Feet Assistive device: Rolling walker (2 wheels) Gait Pattern/deviations: Step-through pattern, Decreased stride length   Gait velocity interpretation: 1.31 - 2.62 ft/sec, indicative of limited community ambulator   General Gait Details: pt with good posture and proximity to RW with cues for breathing technique. SPO2 92-97% on RA  Stairs            Wheelchair Mobility    Modified Rankin (Stroke Patients Only)       Balance Overall balance assessment: Mild deficits observed, not formally tested                                           Pertinent Vitals/Pain Pain Assessment Pain Assessment: No/denies pain    Home Living Family/patient expects to be discharged to:: Private residence Living Arrangements: Spouse/significant other Available Help at Discharge: Friend(s);Available 24 hours/day Type of Home: House Home Access: Stairs to enter   [employer] of Steps: 3   Home Layout: One level Home Equipment: Conservation officer, nature (2 wheels);Cane - single point      Prior Function Prior Level of Function : Independent/Modified Independent  Mobility Comments: a little help to stand from chairs since CABG ADLs Comments: performing ADLs on his own, significant other helping with homemaking     Hand Dominance        Extremity/Trunk Assessment   Upper Extremity Assessment Upper Extremity Assessment: Overall WFL for tasks assessed    Lower Extremity Assessment Lower Extremity Assessment: Overall WFL for tasks  assessed    Cervical / Trunk Assessment Cervical / Trunk Assessment: Normal  Communication   Communication: No difficulties  Cognition Arousal/Alertness: Awake/alert Behavior During Therapy: WFL for tasks assessed/performed Overall Cognitive Status: Within Functional Limits for tasks assessed                                          General Comments      Exercises     Assessment/Plan    PT Assessment All further PT needs can be met in the next venue of care  PT Problem List Decreased mobility;Decreased activity tolerance;Decreased balance       PT Treatment Interventions      PT Goals (Current goals can be found in the Care Plan section)  Acute Rehab PT Goals PT Goal Formulation: All assessment and education complete, DC therapy    Frequency       Co-evaluation               AM-PAC PT "6 Clicks" Mobility  Outcome Measure Help needed turning from your back to your side while in a flat bed without using bedrails?: None Help needed moving from lying on your back to sitting on the side of a flat bed without using bedrails?: None Help needed moving to and from a bed to a chair (including a wheelchair)?: A Little Help needed standing up from a chair using your arms (e.g., wheelchair or bedside chair)?: A Little Help needed to walk in hospital room?: A Little Help needed climbing 3-5 steps with a railing? : A Little 6 Click Score: 20    End of Session Equipment Utilized During Treatment: Gait belt Activity Tolerance: Patient tolerated treatment well Patient left: in chair;with call bell/phone within reach;with family/visitor present Nurse Communication: Mobility status PT Visit Diagnosis: Other abnormalities of gait and mobility (R26.89);Muscle weakness (generalized) (M62.81)    Time: 5831-6742 PT Time Calculation (min) (ACUTE ONLY): 25 min   Charges:   PT Evaluation $PT Eval Moderate Complexity: 1 Mod          [place], PT Acute  Rehabilitation Services Office: [phone]   Kagen Kunath B Brock Larmon 05/19/2022, 1:30 PM

## 2022-05-20 ENCOUNTER — Other Ambulatory Visit (HOSPITAL_COMMUNITY): Payer: Self-pay

## 2022-05-20 ENCOUNTER — Inpatient Hospital Stay (HOSPITAL_COMMUNITY): Payer: PPO

## 2022-05-20 ENCOUNTER — Encounter (HOSPITAL_COMMUNITY): Payer: Self-pay | Admitting: Internal Medicine

## 2022-05-20 DIAGNOSIS — I2699 Other pulmonary embolism without acute cor pulmonale: Secondary | ICD-10-CM | POA: Diagnosis not present

## 2022-05-20 LAB — BASIC METABOLIC PANEL
Anion gap: 9 (ref 5–15)
BUN: 26 mg/dL — ABNORMAL HIGH (ref 8–23)
CO2: 22 mmol/L (ref 22–32)
Calcium: 8.2 mg/dL — ABNORMAL LOW (ref 8.9–10.3)
Chloride: 105 mmol/L (ref 98–111)
Creatinine, Ser: 1.43 mg/dL — ABNORMAL HIGH (ref 0.61–1.24)
GFR, Estimated: 49 mL/min — ABNORMAL LOW (ref >60)
Glucose, Bld: 161 mg/dL — ABNORMAL HIGH (ref 70–99)
Potassium: 3.3 mmol/L — ABNORMAL LOW (ref 3.5–5.1)
Sodium: 136 mmol/L (ref 135–145)

## 2022-05-20 LAB — GLUCOSE, CAPILLARY
Glucose-Capillary: 121 mg/dL — ABNORMAL HIGH (ref 70–99)
Glucose-Capillary: 125 mg/dL — ABNORMAL HIGH (ref 70–99)
Glucose-Capillary: 156 mg/dL — ABNORMAL HIGH (ref 70–99)
Glucose-Capillary: 120 mg/dL — ABNORMAL HIGH (ref 70–99)

## 2022-05-20 LAB — CBC
HCT: 34.4 % — ABNORMAL LOW (ref 39.0–52.0)
Hemoglobin: 11.1 g/dL — ABNORMAL LOW (ref 13.0–17.0)
MCH: 29.7 pg (ref 26.0–34.0)
MCHC: 32.3 g/dL (ref 30.0–36.0)
MCV: 92 fL (ref 80.0–100.0)
Platelets: 266 10*3/uL (ref 150–400)
RBC: 3.74 MIL/uL — ABNORMAL LOW (ref 4.22–5.81)
RDW: 15 % (ref 11.5–15.5)
WBC: 10.4 10*3/uL (ref 4.0–10.5)
nRBC: 0 % (ref 0.0–0.2)

## 2022-05-20 LAB — MAGNESIUM: Magnesium: 2 mg/dL (ref 1.7–2.4)

## 2022-05-20 LAB — APTT: aPTT: 70 seconds — ABNORMAL HIGH (ref 24–36)

## 2022-05-20 LAB — HEPARIN LEVEL (UNFRACTIONATED): Heparin Unfractionated: >1.1 IU/mL — ABNORMAL HIGH (ref 0.30–0.70)

## 2022-05-20 MED ORDER — APIXABAN 5 MG PO TABS: 5.0000 mg | ORAL_TABLET | Freq: Two times a day (BID) | ORAL | Status: DC

## 2022-05-20 MED ORDER — APIXABAN 5 MG PO TABS
10.0000 mg | ORAL_TABLET | Freq: Two times a day (BID) | ORAL | Status: DC
  Administered 2022-05-20 – 2022-05-21 (×3): 10 mg via ORAL
  Filled 2022-05-20 (×3): qty 2

## 2022-05-20 MED ORDER — FUROSEMIDE 40 MG PO TABS
40.0000 mg | ORAL_TABLET | Freq: Every day | ORAL | Status: DC
  Administered 2022-05-20: 40 mg via ORAL
  Filled 2022-05-20: qty 1

## 2022-05-20 MED ORDER — METOPROLOL TARTRATE 50 MG PO TABS
50.0000 mg | ORAL_TABLET | Freq: Two times a day (BID) | ORAL | Status: DC
Start: 2022-05-20 — End: 2022-05-21
  Administered 2022-05-20 – 2022-05-21 (×2): 50 mg via ORAL
  Filled 2022-05-20 (×2): qty 1

## 2022-05-20 MED ORDER — POTASSIUM CHLORIDE CRYS ER 20 MEQ PO TBCR
40.0000 meq | EXTENDED_RELEASE_TABLET | Freq: Once | ORAL | Status: AC
  Administered 2022-05-20: 40 meq via ORAL
  Filled 2022-05-20: qty 2

## 2022-05-20 NOTE — Progress Notes (Signed)
Heart Failure Stewardship Pharmacist Progress Note   PCP: [name], MD PCP-Cardiologist: None    HPI:  81 yo M with PMH of HTN, HLD, afib, and CAD s/p CABG on 8/17.  He was discharged on 8/23 but presented back to the ED on 8/27 with shortness of breath, fatigue, hypoxia, LE edema, and wheezing. CXR with small L pleural effusion. CTA with acute PE. Received thoracentesis on 8/28 removing 1.6L of fluid. Pre-CABG, his EF was 35-45%. An ECHO on 8/28 has shown drop in LVEF to 20-25% with mildly reduced RV.   Current HF Medications: Diuretic: furosemide 40 mg PO daily Beta Blocker: metoprolol tartrate 25 mg BID Other: Imdur 30 mg daily **also on diltiazem PO  Prior to admission HF Medications: Beta blocker: metoprolol tartrate 25 mg BID ACE/ARB/ARNI: fosinopril 20 mg daily (has not restarted yet Other: Imdur 30 mg daily ** also on diltiazem PO and HCTZ (has not restarted yet)  Pertinent Lab Values: Serum creatinine 1.43, BUN 26, Potassium 3.3, Sodium 136, BNP 961.0, Magnesium 2.0, A1c 6.3   Vital Signs: Weight: 228 lbs (admission weight: 228 lbs) Blood pressure: 130/80s  Heart rate: 80s - afib  I/O: incomplete  Medication Assistance / Insurance Benefits Check: Does the patient have prescription insurance?  Yes Type of insurance plan: [employer]  Outpatient Pharmacy:  Prior to admission outpatient pharmacy: [facility] Is the patient willing to use [facility] TOC pharmacy at discharge? Yes Is the patient willing to transition their outpatient pharmacy to utilize a [facility] outpatient pharmacy?   Pending    Assessment: 1. Acute on chronic systolic CHF (LVEF 20-25%), due to ICM s/p CABG on 8/17. NYHA class II symptoms. - Continue furosemide 40 mg PO daily - On metoprolol tartrate 25 mg BID. Will need to consolidate to metoprolol succinate for discharge. - Consider starting Entresto 24/26 mg BID prior to discharge (fosinopril written to restart in September) -  Consider starting spironolactone and SGLT2i prior to discharge to optimize GDMT - Consider stopping diltiazem with reduced LVEF (further reduced compared to pre-CABG LVEF). Can increase BB to help control afib. - Consider stopping PTA HCTZ on discharge. Patient has not restarted this medication yet post-CABG.    Plan: 1) Medication changes recommended at this time: - Stop diltiazem - Stop PTA HCTZ on discharge - Consolidate to metoprolol succinate for discharge (can increase dose if stopping diltiazem) - Discussed with PCP cardiology team, Abigail Miyamoto and Dr. Darrold Junker, and with Dr. Nelson Chimes - will proceed with changes today.   2) Patient assistance: Sherryll Burger copay $40 Marcelline Deist copay $40 - Jardiance copay $40  3)  Education  - Patient has been educated on current HF medications and potential additions to HF medication regimen - Patient verbalizes understanding that over the next few months, these medication doses may change and more medications may be added to optimize HF regimen - Patient has been educated on basic disease state pathophysiology and goals of therapy   Sharen Hones, PharmD, BCPS Heart Failure Stewardship Pharmacist Phone [phone]

## 2022-05-20 NOTE — Progress Notes (Signed)
Heart Failure Nurse Navigator Progress Note  PCP: [name], MD PCP-Cardiologist: Peyton Bottoms., MD Admission Diagnosis: PE s/p recent CABG Admitted from: home with spouse  Presentation:   [name] Goodman presented 8/27 with increased WOB. + PE. CABG 8/17, DC 8/22. No follow ups prior to readmission. Pt resting in recliner with feet elevated. BLE edema noted, L>R. Pt on room air. Spouse and family friends present. No SDOH needs noted upon assessment completion.  Spoke with patient regarding need for HV TOC clinic follow up upon DC. Pt agreeable.   ECHO/ LVEF: 20%  Clinical Course:  Past Medical History:  Diagnosis Date   Arthritis    History of kidney stones    Hyperlipidemia    Hypertension    Nocturia    S/P CABG (coronary artery bypass graft)      Social History   Socioeconomic History   Marital status: Divorced    Spouse name: Not on file   Number of children: 3   Years of education: Not on file   Highest education level: Master's degree (e.g., MA, MS, MEng, MEd, MSW, MBA)  Occupational History   Occupation: retired Psychologist, occupational at [employer]  Tobacco Use   Smoking status: Former    Years: 15.00    Types: Cigarettes    Quit date: 07/17/1975    Years since quitting: 46.8   Smokeless tobacco: Never  Vaping Use   Vaping Use: Never used  Substance and Sexual Activity   Alcohol use: No   Drug use: No   Sexual activity: Not on file  Other Topics Concern   Not on file  Social History Narrative   Lives alone   Social Determinants of Health   Financial Resource Strain: Low Risk  (05/20/2022)   Overall Financial Resource Strain (CARDIA)    Difficulty of Paying Living Expenses: Not hard at all  Food Insecurity: No Food Insecurity (05/20/2022)   Hunger Vital Sign    Worried About Running Out of Food in the Last Year: Never true    Ran Out of Food in the Last Year: Never true  Transportation Needs: No Transportation Needs (05/20/2022)   PRAPARE - Doctor, general practice (Medical): No    Lack of Transportation (Non-Medical): No  Physical Activity: Not on file  Stress: Not on file  Social Connections: Not on file   Education Assessment and Provision:  Detailed education and instructions provided on heart failure disease management including the following:  Signs and symptoms of Heart Failure When to call the physician Importance of daily weights Low sodium diet Fluid restriction Medication management Anticipated future follow-up appointments  Patient education given on each of the above topics.  Patient acknowledges understanding via teach back method and acceptance of all instructions.  Education Materials:  "Living Better With Heart Failure" Booklet, HF zone tool, & Daily Weight Tracker Tool.  Patient has scale at home: no, spouse will pick up Patient has pill box at home: yes   High Risk Criteria for Readmission and/or Poor Patient Outcomes: Heart failure hospital admissions (last 6 months): 2  No Show rate: NA Difficult social situation: No Demonstrates medication adherence: yes Primary Language: English Literacy level: able to read/write and comprehend. Wears reading glasses.   Barriers of Care:   none  Considerations/Referrals:   Referral made to Heart Failure Pharmacist Stewardship: yes, appreciated Referral made to Heart Failure CSW/NCM TOC: no Referral made to Heart & Vascular TOC clinic: yes, 9/6   Items for Follow-up  on DC/TOC: -medication optimization -HF education -diet/fluid modifications -activity modifications   Ozella Rocks, MSN, RN

## 2022-05-20 NOTE — Progress Notes (Signed)
Called to patient's room d/t patient feels like he was having trouble breathing. Patient stated that "he was wheezing".   Patient placed on [place] 2L. Upper bilateral lungs diminished. No wheezing heard.  Lower lungs bilateral fine crackles.  Patient offered a breathing treatment but declined at this time.  Will continue to monitor

## 2022-05-20 NOTE — Progress Notes (Signed)
CARDIAC REHAB PHASE I   Pt ambulated 439ft in hallway independently with steady gait at request of PA. Pt denies CP, SOB, or dizziness throughout. Pt returned to recliner. Provided toothbrush and toothpaste. Pt denies further needs at this time. Wife at bedside.  [id number] Reynold Bowen, RN BSN 05/20/2022 10:09 AM

## 2022-05-20 NOTE — Progress Notes (Signed)
PROGRESS NOTE    [name] Goodman  [id number] DOB: May 11, 1941 DOA: 05/18/2022 PCP: Maryland Pink, MD   Brief Narrative:  81 y.o. male with medical history significant of HTN and HLD presenting with SOB.  He was last hospitalized by CT surgery form 8/17-24 for CABG x 5.  Postop also developed A-fib/20 taking Eliquis.  Now presents to the hospital with acute PE.  Status post left-sided thoracentesis with 1.6 L fluid removal on 8/28.   Assessment & Plan:  Principal Problem:   Acute pulmonary embolism without acute cor pulmonale (HCC) Active Problems:   S/P CABG x 5   Pleural effusion due to CHF (congestive heart failure) (HCC)   Atrial fibrillation, chronic (HCC)   Essential hypertension   Dyslipidemia   Chronic systolic CHF (congestive heart failure) (HCC)   Acute respiratory distress     Acute PE and right upper and middle lobe without cor pulmonale. Acute respiratory distress with hypoxia, 2 L nasal cannula Was on a very short course of Eliquis treatment prior to hospitalization, I really do not consider this is failure of Eliquis at this time.  Repeat echocardiogram shows EF of 25% which is lower than recent echo showing 40%.  CT surgery is following. Transition off heparin to Eliquis.    Bilateral pleural effusions s/p CABG Status post CABG 8/17.  Echocardiogram pending.  Currently on Cardizem and Lopressor.  Start Lasix 40 mg orally for 7 days total thereafter as needed. Status post IR left thoracentesis 1.6 L taken out 8/28.  Repeat chest x-ray stable.   Chronic atrial fibrillation On Cardizem and metoprolol.  Starting Eliquis Should ptn be off Cardizem with reduced ef and uptitrate BB? This can be addressed at this outptn primary cardiologist (Dr Saralyn Pilar).  He was previously started by their service while he had reduced EF due to difficult to control A-fib.   HTN Currently on Cardizem, Imdur and Lopressor.   HLD -Continue atorvastatin   Chronic systolic  CHF -Intra-operative TEE showed EF 40-45% -Current LE edema and pleural effusions may be associated with mild volume overload   PT/OT ordered   DVT prophylaxis: Heparin drip Code Status: Full code Family Communication: Spouse at bedside  We will start patient on oral Lasix, transition off heparin drip to p.o. Eliquis.  PT/OT.  Hopefully home in next 24-48 hours.  Subjective: Patient feels much better today but was speaking to me in long sentences his heart rate was going up as high as 110-115.   Examination: Constitutional: Not in acute distress chronically ill-appearing Respiratory: Bibasilar crackles Cardiovascular: Normal sinus rhythm, no rubs Abdomen: Nontender nondistended good bowel sounds Musculoskeletal: No edema noted Skin: No rashes seen Neurologic: CN 2-12 grossly intact.  And nonfocal Psychiatric: Normal judgment and insight. Alert and oriented x 3. Normal mood.  Objective: Vitals:   05/19/22 1706 05/19/22 2039 05/19/22 2320 05/20/22 0359  BP: 116/68 113/82 (!) 142/83 132/83  Pulse: 84 (!) 101 (!) 113 83  Resp: (!) 22 20 20 17   Temp:  98.7 F (37.1 C) 98.3 F (36.8 C) 97.6 F (36.4 C)  TempSrc:  Oral Oral Oral  SpO2: 95% 94% 94% 95%  Weight:      Height:        Intake/Output Summary (Last 24 hours) at 05/20/2022 0757 Last data filed at 05/20/2022 0129 Gross per 24 hour  Intake 847.9 ml  Output 125 ml  Net 722.9 ml   Filed Weights   05/18/22 1453 05/18/22 2120  Weight: 96.2 kg 103.7  kg     Data Reviewed:   CBC: Recent Labs  Lab 05/18/22 1430 05/19/22 0223 05/20/22 0256  WBC 13.4* 12.3* 10.4  NEUTROABS 11.1*  --   --   HGB 11.5* 11.2* 11.1*  HCT 36.0* 35.8* 34.4*  MCV 95.0 95.0 92.0  PLT 326 297 [id number]   Basic Metabolic Panel: Recent Labs  Lab 05/14/22 0321 05/18/22 1430 05/19/22 0223 05/20/22 0256  NA 138 136 138 136  K 3.5 4.6 4.0 3.3*  CL 106 103 103 105  CO2 24 23 25 22   GLUCOSE 138* 141* 147* 161*  BUN 36* 21 22 26*   CREATININE 1.23 1.33* 1.47* 1.43*  CALCIUM 7.9* 8.4* 8.5* 8.2*  MG  --   --   --  2.0   GFR: Estimated Creatinine Clearance: 51.2 mL/min (A) (by C-G formula based on SCr of 1.43 mg/dL (H)). Liver Function Tests: Recent Labs  Lab 05/18/22 1430  AST 19  ALT 33  ALKPHOS 67  BILITOT 1.1  PROT 6.0*  ALBUMIN 3.2*   No results for input(s): "LIPASE", "AMYLASE" in the last 168 hours. No results for input(s): "AMMONIA" in the last 168 hours. Coagulation Profile: Recent Labs  Lab 05/18/22 1837  INR 1.8*   Cardiac Enzymes: No results for input(s): "CKTOTAL", "CKMB", "CKMBINDEX", "TROPONINI" in the last 168 hours. BNP (last 3 results) No results for input(s): "PROBNP" in the last 8760 hours. HbA1C: No results for input(s): "HGBA1C" in the last 72 hours. CBG: Recent Labs  Lab 05/19/22 1005 05/19/22 1311 05/19/22 1633 05/19/22 2135 05/20/22 0631  GLUCAP 133* 132* 99 128* 121*   Lipid Profile: No results for input(s): "CHOL", "HDL", "LDLCALC", "TRIG", "CHOLHDL", "LDLDIRECT" in the last 72 hours. Thyroid Function Tests: No results for input(s): "TSH", "T4TOTAL", "FREET4", "T3FREE", "THYROIDAB" in the last 72 hours. Anemia Panel: No results for input(s): "VITAMINB12", "FOLATE", "FERRITIN", "TIBC", "IRON", "RETICCTPCT" in the last 72 hours. Sepsis Labs: No results for input(s): "PROCALCITON", "LATICACIDVEN" in the last 168 hours.  Recent Results (from the past 240 hour(s))  Resp Panel by RT-PCR (Flu A&B, Covid) Anterior Nasal Swab     Status: None   Collection Time: 05/18/22  2:57 PM   Specimen: Anterior Nasal Swab  Result Value Ref Range Status   SARS Coronavirus 2 by RT PCR NEGATIVE NEGATIVE Final    Comment: (NOTE) SARS-CoV-2 target nucleic acids are NOT DETECTED.  The SARS-CoV-2 RNA is generally detectable in upper respiratory specimens during the acute phase of infection. The lowest concentration of SARS-CoV-2 viral copies this assay can detect is 138 copies/mL. A  negative result does not preclude SARS-Cov-2 infection and should not be used as the sole basis for treatment or other patient management decisions. A negative result may occur with  improper specimen collection/handling, submission of specimen other than nasopharyngeal swab, presence of viral mutation(s) within the areas targeted by this assay, and inadequate number of viral copies(<138 copies/mL). A negative result must be combined with clinical observations, patient history, and epidemiological information. The expected result is Negative.  Fact Sheet for Patients:  EntrepreneurPulse.com.au  Fact Sheet for Healthcare Providers:  IncredibleEmployment.be  This test is no t yet approved or cleared by the [place] FDA and  has been authorized for detection and/or diagnosis of SARS-CoV-2 by FDA under an Emergency Use Authorization (EUA). This EUA will remain  in effect (meaning this test can be used) for the duration of the COVID-19 declaration under Section 564(b)(1) of the Act, 21 U.S.C.section 360bbb-3(b)(1), unless the  authorization is terminated  or revoked sooner.       Influenza A by PCR NEGATIVE NEGATIVE Final   Influenza B by PCR NEGATIVE NEGATIVE Final    Comment: (NOTE) The Xpert Xpress SARS-CoV-2/FLU/RSV plus assay is intended as an aid in the diagnosis of influenza from Nasopharyngeal swab specimens and should not be used as a sole basis for treatment. Nasal washings and aspirates are unacceptable for Xpert Xpress SARS-CoV-2/FLU/RSV testing.  Fact Sheet for Patients: EntrepreneurPulse.com.au  Fact Sheet for Healthcare Providers: IncredibleEmployment.be  This test is not yet approved or cleared by the [place] FDA and has been authorized for detection and/or diagnosis of SARS-CoV-2 by FDA under an Emergency Use Authorization (EUA). This EUA will remain in effect (meaning this test can  be used) for the duration of the COVID-19 declaration under Section 564(b)(1) of the Act, 21 U.S.C. section 360bbb-3(b)(1), unless the authorization is terminated or revoked.  Performed at [facility], [place] [address] [place], [place] [address]          Radiology Studies: IR THORACENTESIS ASP PLEURAL SPACE W/IMG GUIDE  Result Date: 05/19/2022 INDICATION: Patient with a history of CABG x5 May 08, 2022. Patient presented to the ED 05/18/2022 with complaints of shortness of breath and fatigue with decreased O2 saturations. He was found to have PE and pleural effusions. Interventional radiology asked to perform a therapeutic thoracentesis. EXAM: ULTRASOUND GUIDED THORACENTESIS MEDICATIONS: 1% lidocaine 10 mL COMPLICATIONS: None immediate. PROCEDURE: An ultrasound guided thoracentesis was thoroughly discussed with the patient and questions answered. The benefits, risks, alternatives and complications were also discussed. The patient understands and wishes to proceed with the procedure. Written consent was obtained. Ultrasound was performed to localize and mark an adequate pocket of fluid in the left chest. The area was then prepped and draped in the normal sterile fashion. 1% Lidocaine was used for local anesthesia. Under ultrasound guidance a 6 Fr Safe-T-Centesis catheter was introduced. Thoracentesis was performed. The catheter was removed and a dressing applied. FINDINGS: A total of approximately 1.6 L of blood-tinged fluid was removed. IMPRESSION: Successful ultrasound guided left thoracentesis yielding 1.6 L of pleural fluid. Read by: Soyla Dryer, NP Electronically Signed   By: Lucrezia Europe M.D.   On: 05/19/2022 12:37   ECHOCARDIOGRAM LIMITED  Result Date: 05/19/2022    ECHOCARDIOGRAM LIMITED REPORT   Patient Name:   JONIE KETTINGER Date of Exam: 05/19/2022 Medical Rec #:  [id number]            Height:       73.0 in Accession #:    [id number]           Weight:       228.6 lb Date  of Birth:  08-May-1941            BSA:          2.277 m Patient Age:    61 years             BP:           139/106 mmHg Patient Gender: M                    HR:           90 bpm. Exam Location:  Inpatient Procedure: Limited Echo, Cardiac Doppler and Limited Color Doppler Indications:    PE  History:        Patient has prior history of Echocardiogram examinations, most  recent 05/08/2022. CHF, CAD, Prior CABG, TIA, Arrythmias:Atrial                 Fibrillation and Bradycardia; Risk Factors:Hypertension,                 Non-Smoker and Dyslipidemia.  Sonographer:    Aron Baba Referring Phys: [id number] JENNIFER YATES  Sonographer Comments: CABG Inscision. IMPRESSIONS  1. Left ventricular ejection fraction, by estimation, is 25 to 30%. The left ventricle has severely decreased function.  2. Right ventricular systolic function is mildly reduced. The right ventricular size is normal.  3. The mitral valve is grossly normal. Comparison(s): Compared to TEE on 05/08/22, the LVEF appears to have dropped to 25-30% from 40-45%. FINDINGS  Left Ventricle: Left ventricular ejection fraction, by estimation, is 25 to 30%. The left ventricle has severely decreased function. The left ventricular internal cavity size was normal in size. There is no left ventricular hypertrophy. Abnormal (paradoxical) septal motion consistent with post-operative status. Right Ventricle: The right ventricular size is normal. Right ventricular systolic function is mildly reduced. Mitral Valve: The mitral valve is grossly normal. Tricuspid Valve: The tricuspid valve is normal in structure. Tricuspid valve regurgitation is trivial. LEFT VENTRICLE PLAX 2D LVIDd:         5.20 cm LVIDs:         4.90 cm LV PW:         1.10 cm LV IVS:        1.00 cm  RIGHT VENTRICLE RV Basal diam:  4.30 cm RV Mid diam:    2.80 cm RV S prime:     8.81 cm/s TAPSE (M-mode): 0.5 cm LEFT ATRIUM         Index       RIGHT ATRIUM           Index LA diam:    4.00 cm 1.76 cm/m   RA Area:     23.30 cm                                 RA Volume:   68.10 ml  29.90 ml/m Laurance Flatten MD Electronically signed by Laurance Flatten MD Signature Date/Time: 05/19/2022/12:26:44 PM    Final    DG Chest 1 View  Result Date: 05/19/2022 CLINICAL DATA:  Status post left thoracentesis. EXAM: CHEST  1 VIEW COMPARISON:  May 18, 2022. FINDINGS: There is no definite pneumothorax status post left thoracentesis. Left pleural effusion is smaller. IMPRESSION: No definite pneumothorax status post left thoracentesis. Electronically Signed   By: Lupita Raider M.D.   On: 05/19/2022 09:32   CT Angio Chest PE W and/or Wo Contrast  Result Date: 05/18/2022 CLINICAL DATA:  Shortness of breath EXAM: CT ANGIOGRAPHY CHEST WITH CONTRAST TECHNIQUE: Multidetector CT imaging of the chest was performed using the standard protocol during bolus administration of intravenous contrast. Multiplanar CT image reconstructions and MIPs were obtained to evaluate the vascular anatomy. RADIATION DOSE REDUCTION: This exam was performed according to the departmental dose-optimization program which includes automated exposure control, adjustment of the mA and/or kV according to patient size and/or use of iterative reconstruction technique. CONTRAST:  25mL OMNIPAQUE IOHEXOL 350 MG/ML SOLN COMPARISON:  None Available. FINDINGS: Cardiovascular: Segmental and subsegmental pulmonary embolus is seen in the right middle lobe and right upper lobe. Cardiomegaly with no evidence of right heart strain, RV/LV ratio of 0.9. Trace pericardial effusion. Severe left main and three-vessel  coronary artery calcifications status post CABG. Normal caliber thoracic aorta with mild calcified plaque. Mediastinum/Nodes: Esophagus and thyroid are unremarkable. Mild fat stranding of the anterior mediastinum. No pathologically enlarged lymph nodes seen in the chest. Lungs/Pleura: Central airways are patent. Bibasilar atelectasis. Mild ground-glass  opacities which are most pronounced in the upper lungs. Moderate right and large left pleural effusions. Near complete collapse of the left lower lobe. Upper Abdomen: Partially visualized simple appearing left renal cysts, further follow-up imaging is recommended Musculoskeletal: Prior median sternotomy with intact sternal wires. No acute osseous abnormality. Review of the MIP images confirms the above findings. IMPRESSION: 1. Segmental and subsegmental pulmonary embolus of the right middle lobe and right upper lobe. 2. Mild fat stranding of the anterior mediastinum, likely postsurgical changes related to recent CABG. No organized fluid collection 3. Mild pulmonary edema. 4. Moderate right and large left pleural effusions with near complete collapse of the left lower lobe. 5. Cardiomegaly and aortic Atherosclerosis (ICD10-I70.0). Critical Value/emergent results were called by telephone at the time of interpretation on 05/18/2022 at 5:08 pm to provider [facility] , who verbally acknowledged these results. Electronically Signed   By: Allegra Lai M.D.   On: 05/18/2022 17:11   DG Chest Port 1 View  Result Date: 05/18/2022 CLINICAL DATA:  Shortness of breath, chest pain EXAM: PORTABLE CHEST 1 VIEW COMPARISON:  Portable exam 1529 hours compared to 05/13/2022 FINDINGS: Enlargement of cardiac silhouette post CABG. Mediastinal contours and pulmonary vascularity normal. Persistent atelectasis versus consolidation LEFT lower lobe. Small LEFT pleural effusion. Persistent accentuation of perihilar interstitial markings without acute infiltrate or pneumothorax. Bones demineralized. IMPRESSION: Enlargement of cardiac silhouette post CABG. Persistent LEFT lower lobe atelectasis versus consolidation with small associated LEFT pleural effusion. Electronically Signed   By: Ulyses Southward M.D.   On: 05/18/2022 15:44        Scheduled Meds:  aspirin EC  81 mg Oral Daily   atorvastatin  40 mg Oral Daily   diltiazem   240 mg Oral Daily   docusate sodium  100 mg Oral BID   feeding supplement  237 mL Oral BID BM   insulin aspart  0-15 Units Subcutaneous TID WC   isosorbide mononitrate  30 mg Oral Daily   metoprolol tartrate  25 mg Oral BID   multivitamin with minerals  1 tablet Oral Daily   potassium chloride  40 mEq Oral Once   sodium chloride flush  3 mL Intravenous Q12H   Continuous Infusions:  heparin 1,600 Units/hr (05/20/22 0129)     LOS: 1 day   Time spent= 35 mins    Britiany Silbernagel Joline Maxcy, MD [facility]  If 7PM-7AM, please contact night-coverage  05/20/2022, 7:57 AM

## 2022-05-20 NOTE — Progress Notes (Signed)
      [address]       [place] [address]             [phone]       Subjective:   Patient breathing better today.  Overall feeling better, hoping to go home.  Wants to get up and walk  Objective: Vital signs in last 24 hours: Temp:  [97.5 F (36.4 C)-98.7 F (37.1 C)] 98.7 F (37.1 C) (08/29 0805) Pulse Rate:  [65-113] 107 (08/29 0833) Cardiac Rhythm: Atrial fibrillation (08/29 0926) Resp:  [17-22] 17 (08/29 0833) BP: (90-142)/(67-83) 140/78 (08/29 0805) SpO2:  [94 %-98 %] 98 % (08/29 0833)  Intake/Output from previous day: 08/28 0701 - 08/29 0700 In: 847.9 [P.O.:360; I.V.:487.9] Out: 250 [Urine:250] Intake/Output this shift: Total I/O In: 240 [P.O.:240] Out: -   General appearance: alert, cooperative, and no distress Heart: irregularly irregular rhythm Lungs: diminished breath sounds bibasilar Abdomen: soft, non-tender; bowel sounds normal; no masses,  no organomegaly Extremities: edema improving Wound: clean and dry  Lab Results: Recent Labs    05/19/22 0223 05/20/22 0256  WBC 12.3* 10.4  HGB 11.2* 11.1*  HCT 35.8* 34.4*  PLT 297 266   BMET:  Recent Labs    05/19/22 0223 05/20/22 0256  NA 138 136  K 4.0 3.3*  CL 103 105  CO2 25 22  GLUCOSE 147* 161*  BUN 22 26*  CREATININE 1.47* 1.43*  CALCIUM 8.5* 8.2*    PT/INR:  Recent Labs    05/18/22 1837  LABPROT 20.9*  INR 1.8*   ABG    Component Value Date/Time   PHART 7.362 05/09/2022 0009   HCO3 22.8 05/09/2022 0009   TCO2 24 05/09/2022 0009   ACIDBASEDEF 2.0 05/09/2022 0009   O2SAT 97 05/09/2022 0009   CBG (last 3)  Recent Labs    05/19/22 1633 05/19/22 2135 05/20/22 0631  GLUCAP 99 128* 121*    Assessment/Plan:  CV- Atrial Fibrillation-on Cardizem, Imdur, Lopressor.Marland Kitchen okay to resume Eliquis for stroke prevention PE-currently on Heparin, can transition back to Eliquis Pulm- improvement of left pleural effusion S/P Thoracentesis with removal of 1600 ml of  fluid, off oxygen continue IS Renal- creatinine stable at 1.43, would continue Lasix 40 mg daily x 7 days, then change to daily prn for weight gain of 3 lbs in 24 hours or 5 lbs in 48 hours Hypokalemia- due to diuretics usage, supplements ordered Dispo- patient much improved after Thoracentesis yesterday, would continue diuretics for effusion, okay to resume Eliquis, remains in Atrial Fibrillation.. I will set up 1 week follow with repeat CXR next week, care per primary.Marland Kitchen we will sign off    LOS: 1 day    Lowella Dandy, PA-C 05/20/2022

## 2022-05-20 NOTE — Progress Notes (Signed)
New order received, pt was evaled and D/C'd by OT yesterday and without any new events since then, thus will not re-eval pt. Ambulated with cardiac rehab earlier today and they signed off at pt's request. Mobility team still following patient.  Ignacia Palma, OTR/L [facility] [phone] Office [phone]

## 2022-05-20 NOTE — Progress Notes (Addendum)
ANTICOAGULATION CONSULT NOTE  Pharmacy Consult for Heparin/Eliquis Indication: New PE (CABG 04/2022 discharged on Eliquis for Afib)  No Known Allergies  Patient Measurements: Height: 6\' 1"  (185.4 cm) Weight: 103.7 kg (228 lb 9.9 oz) IBW/kg (Calculated) : 79.9 Heparin Dosing Weight: 96.2 kg  Vital Signs: Temp: 98.7 F (37.1 C) (08/29 0805) Temp Source: Oral (08/29 0805) BP: 140/78 (08/29 0805) Pulse Rate: 107 (08/29 0833)  Labs: Recent Labs    05/18/22 1430 05/18/22 1430 05/18/22 1722 05/18/22 1837 05/19/22 0223 05/19/22 1014 05/20/22 0256  HGB 11.5*  --   --   --  11.2*  --  11.1*  HCT 36.0*  --   --   --  35.8*  --  34.4*  PLT 326  --   --   --  297  --  266  APTT  --    < >  --  36 73* 69* 70*  LABPROT  --   --   --  20.9*  --   --   --   INR  --   --   --  1.8*  --   --   --   HEPARINUNFRC  --   --   --  >1.10* >1.10*  --  >1.10*  CREATININE 1.33*  --   --   --  1.47*  --  1.43*  TROPONINIHS 277*  --  255*  --   --   --   --    < > = values in this interval not displayed.     Estimated Creatinine Clearance: 51.2 mL/min (A) (by C-G formula based on SCr of 1.43 mg/dL (H)).   Medical History: Past Medical History:  Diagnosis Date   Arthritis    History of kidney stones    Hyperlipidemia    Hypertension    Nocturia    S/P CABG (coronary artery bypass graft)     Medications:  Medications Prior to Admission  Medication Sig Dispense Refill Last Dose   apixaban (ELIQUIS) 5 MG TABS tablet Take 1 tablet (5 mg total) by mouth 2 (two) times daily. 60 tablet 3 05/18/2022 at 7:00am   aspirin EC 81 MG tablet Take 81 mg by mouth daily.   05/18/2022   atorvastatin (LIPITOR) 40 MG tablet Take 1 tablet (40 mg total) by mouth daily. 30 tablet 3 05/18/2022   cyanocobalamin (VITAMIN B12) 1000 MCG tablet Take 1,000 mcg by mouth daily.   05/18/2022   diltiazem (TIAZAC) 240 MG 24 hr capsule Take 240 mg by mouth daily.   05/18/2022   [START ON 06/15/2022] fosinopril (MONOPRIL) 20  MG tablet Take 1 tablet (20 mg total) by mouth daily.   05/18/2022   [START ON 06/15/2022] hydrochlorothiazide (HYDRODIURIL) 25 MG tablet Take 1 tablet (25 mg total) by mouth daily.   05/18/2022   isosorbide mononitrate (IMDUR) 30 MG 24 hr tablet Take 1 tablet (30 mg total) by mouth daily. 30 tablet 0 05/18/2022   metoprolol tartrate (LOPRESSOR) 25 MG tablet Take 1 tablet (25 mg total) by mouth 2 (two) times daily. 60 tablet 3 05/18/2022 at 7:00am   traMADol (ULTRAM) 50 MG tablet Take 1 tablet (50 mg total) by mouth every 4 (four) hours as needed for moderate pain. 30 tablet 0 Past Week    Assessment: 7 yom with a history of SOB, dry cough, fatigue, peripheral edema and hypoxia. Patient is presenting with SOB. Eliquis per pharmacy consult placed for new PE.  Pt is s/p CABG on 8/17.  Discharged on eliquis 5mg  BID for AF. Last dose 8/27 0700.   Heparin level remains >1.1 units/mL due to recent Eliquis use. aPTT this morning 70 (therapeutic) while on heparin 1600 units/hr.  Addendum: Orders to transition to Eliquis this afternoon. After discussing with Dr. 9/27, will complete the full 7 days of Eliquis 10mg  BID given new PE before transitioning to 5mg  BID.   Goal of Therapy:  Monitor platelets by anticoagulation protocol: Yes   Plan:  Stop heparin infusion Start Eliquis 10mg  PO BID x 7 days (Last dose 05/26/22 @2200 ) Eliquis 5mg  PO BID to start 05/27/22 @100   Monitor for signs/symptoms of bleeding.  , PharmD 05/20/2022 10:13 AM

## 2022-05-20 NOTE — Discharge Instructions (Signed)

## 2022-05-21 ENCOUNTER — Inpatient Hospital Stay (HOSPITAL_COMMUNITY): Payer: PPO

## 2022-05-21 DIAGNOSIS — I5022 Chronic systolic (congestive) heart failure: Secondary | ICD-10-CM | POA: Diagnosis not present

## 2022-05-21 DIAGNOSIS — I2699 Other pulmonary embolism without acute cor pulmonale: Secondary | ICD-10-CM | POA: Diagnosis not present

## 2022-05-21 LAB — CBC
HCT: 35.8 % — ABNORMAL LOW (ref 39.0–52.0)
Hemoglobin: 11.1 g/dL — ABNORMAL LOW (ref 13.0–17.0)
MCH: 29.7 pg (ref 26.0–34.0)
MCHC: 31 g/dL (ref 30.0–36.0)
MCV: 95.7 fL (ref 80.0–100.0)
Platelets: 321 10*3/uL (ref 150–400)
RBC: 3.74 MIL/uL — ABNORMAL LOW (ref 4.22–5.81)
RDW: 14.9 % (ref 11.5–15.5)
WBC: 10.3 10*3/uL (ref 4.0–10.5)
nRBC: 0 % (ref 0.0–0.2)

## 2022-05-21 LAB — BASIC METABOLIC PANEL
Anion gap: 10 (ref 5–15)
BUN: 25 mg/dL — ABNORMAL HIGH (ref 8–23)
CO2: 23 mmol/L (ref 22–32)
Calcium: 8.5 mg/dL — ABNORMAL LOW (ref 8.9–10.3)
Chloride: 105 mmol/L (ref 98–111)
Creatinine, Ser: 1.38 mg/dL — ABNORMAL HIGH (ref 0.61–1.24)
GFR, Estimated: 51 mL/min — ABNORMAL LOW (ref >60)
Glucose, Bld: 156 mg/dL — ABNORMAL HIGH (ref 70–99)
Potassium: 3.8 mmol/L (ref 3.5–5.1)
Sodium: 138 mmol/L (ref 135–145)

## 2022-05-21 LAB — GLUCOSE, CAPILLARY
Glucose-Capillary: 121 mg/dL — ABNORMAL HIGH (ref 70–99)
Glucose-Capillary: 140 mg/dL — ABNORMAL HIGH (ref 70–99)
Glucose-Capillary: 124 mg/dL — ABNORMAL HIGH (ref 70–99)
Glucose-Capillary: 118 mg/dL — ABNORMAL HIGH (ref 70–99)

## 2022-05-21 LAB — MAGNESIUM: Magnesium: 2 mg/dL (ref 1.7–2.4)

## 2022-05-21 LAB — BRAIN NATRIURETIC PEPTIDE: B Natriuretic Peptide: 576.9 pg/mL — ABNORMAL HIGH (ref 0.0–100.0)

## 2022-05-21 MED ORDER — METOPROLOL SUCCINATE ER 50 MG PO TB24
75.0000 mg | ORAL_TABLET | Freq: Every day | ORAL | Status: DC
  Administered 2022-05-21 – 2022-05-22 (×2): 75 mg via ORAL
  Filled 2022-05-21 (×2): qty 1

## 2022-05-21 MED ORDER — FUROSEMIDE 10 MG/ML IJ SOLN
40.0000 mg | Freq: Once | INTRAMUSCULAR | Status: AC
  Administered 2022-05-21: 40 mg via INTRAVENOUS
  Filled 2022-05-21: qty 4

## 2022-05-21 MED ORDER — ISOSORBIDE MONONITRATE ER 30 MG PO TB24
15.0000 mg | ORAL_TABLET | Freq: Every day | ORAL | Status: DC
  Administered 2022-05-21: 15 mg via ORAL
  Filled 2022-05-21: qty 1

## 2022-05-21 MED ORDER — SODIUM CHLORIDE 0.9% FLUSH
3.0000 mL | Freq: Two times a day (BID) | INTRAVENOUS | Status: DC
  Administered 2022-05-21 – 2022-05-25 (×5): 3 mL via INTRAVENOUS

## 2022-05-21 MED ORDER — LOSARTAN POTASSIUM 25 MG PO TABS
25.0000 mg | ORAL_TABLET | Freq: Every day | ORAL | Status: DC
  Administered 2022-05-21: 25 mg via ORAL
  Filled 2022-05-21: qty 1

## 2022-05-21 MED ORDER — FUROSEMIDE 10 MG/ML IJ SOLN
40.0000 mg | Freq: Two times a day (BID) | INTRAMUSCULAR | Status: DC
  Administered 2022-05-21 – 2022-05-23 (×5): 40 mg via INTRAVENOUS
  Filled 2022-05-21 (×5): qty 4

## 2022-05-21 MED ORDER — METOPROLOL SUCCINATE ER 100 MG PO TB24
100.0000 mg | ORAL_TABLET | Freq: Every day | ORAL | Status: DC
Start: 2022-05-21 — End: 2022-05-21

## 2022-05-21 MED ORDER — HEPARIN (PORCINE) 25000 UT/250ML-% IV SOLN
1550.0000 [IU]/h | INTRAVENOUS | Status: DC
Start: 2022-05-21 — End: 2022-05-23
  Administered 2022-05-21: 1600 [IU]/h via INTRAVENOUS
  Administered 2022-05-22: 1500 [IU]/h via INTRAVENOUS
  Administered 2022-05-23: 1550 [IU]/h via INTRAVENOUS
  Filled 2022-05-21 (×3): qty 250

## 2022-05-21 MED ORDER — POTASSIUM CHLORIDE CRYS ER 20 MEQ PO TBCR
40.0000 meq | EXTENDED_RELEASE_TABLET | Freq: Once | ORAL | Status: AC
  Administered 2022-05-21: 40 meq via ORAL
  Filled 2022-05-21: qty 2

## 2022-05-21 MED ORDER — EMPAGLIFLOZIN 10 MG PO TABS
10.0000 mg | ORAL_TABLET | Freq: Every day | ORAL | Status: DC
  Administered 2022-05-21 – 2022-05-28 (×8): 10 mg via ORAL
  Filled 2022-05-21 (×9): qty 1

## 2022-05-21 NOTE — Progress Notes (Signed)
PROGRESS NOTE    [name] Goodman  [id number] DOB: [birth year] DOA: 05/18/2022 PCP: Maryland Pink, MD   81 y.o. male with medical history significant of HTN and HLD presenting with SOB.  He was last hospitalized by CT surgery form 8/17-24 for CABG x 5.  Postop also developed A-fib taking Eliquis.  Presented to the ED with dyspnea on exertion, CTA chest noted pulmonary embolism, bilateral pleural effusions -Status post left-sided thoracentesis with 1.6 L fluid removal on 8/28. -2D echo noted drop in EF down to 25%   Subjective: -Overnight had dyspnea and orthopnea  Assessment and Plan:  Acute PE  Acute hypoxic respiratory failure -Second related to CHF, pleural effusions and PE  -agree that patient was on a very short course of Eliquis prior to hospitalization hence would not consider this an Eliquis failure -Was on IV heparin initially, switched back to Eliquis at VTE doses  Acute on chronic systolic CHF Bilateral pleural effusions -Recent CABG -Echo with EF down to 25-30%, down from around 40-45% on intra-op TEE 8/17 -Underwent thoracentesis 8/28, 1.6 L drained- unfortunately fluid was not sent for further analysis -Continue IV Lasix today -Continue beta-blocker, Jardiance -Add Aldactone tomorrow if BP is stable -will request cardiology input   Chronic atrial fibrillation -In A-fib, heart rate controlled, metoprolol dose increased, Cardizem discontinued with low EF -Continue Eliquis  CKD 3a -Stable, monitor with diuresis  Hypokalemia -Replaced   HTN -Stable   HLD -Continue atorvastatin   DVT prophylaxis: Heparin drip Code Status: Full code Family Communication: Discussed with patient detail, no family at bedside Disposition Plan: Home likely 48 hours  Consultants:    Procedures:   Antimicrobials:    Objective: Vitals:   05/21/22 0321 05/21/22 0532 05/21/22 0834 05/21/22 1100  BP: 130/83  134/78 102/63  Pulse: 68  68   Resp: 20  19   Temp: (!)  97.5 F (36.4 C)  97.7 F (36.5 C) 97.7 F (36.5 C)  TempSrc: Oral  Oral Oral  SpO2: 100%  100%   Weight:  101.6 kg    Height:        Intake/Output Summary (Last 24 hours) at 05/21/2022 1418 Last data filed at 05/21/2022 1408 Gross per 24 hour  Intake 440 ml  Output 2050 ml  Net -1610 ml   Filed Weights   05/18/22 1453 05/18/22 2120 05/21/22 0532  Weight: 96.2 kg 103.7 kg 101.6 kg    Examination:  General exam: Elderly pleasant male sitting up in bed, AAOx3, no distress HEENT: Positive JVD CVS: S1-S2, regular rhythm Lungs: Fine bilateral rales Abdomen: Soft, nontender, bowel sounds present Extremities: No edema Skin: No rashes Psychiatry:  Mood & affect appropriate.     Data Reviewed:   CBC: Recent Labs  Lab 05/18/22 1430 05/19/22 0223 05/20/22 0256 05/21/22 0313  WBC 13.4* 12.3* 10.4 10.3  NEUTROABS 11.1*  --   --   --   HGB 11.5* 11.2* 11.1* 11.1*  HCT 36.0* 35.8* 34.4* 35.8*  MCV 95.0 95.0 92.0 95.7  PLT 326 297 266 [id number]   Basic Metabolic Panel: Recent Labs  Lab 05/18/22 1430 05/19/22 0223 05/20/22 0256 05/21/22 0313  NA 136 138 136 138  K 4.6 4.0 3.3* 3.8  CL 103 103 105 105  CO2 23 25 22 23   GLUCOSE 141* 147* 161* 156*  BUN 21 22 26* 25*  CREATININE 1.33* 1.47* 1.43* 1.38*  CALCIUM 8.4* 8.5* 8.2* 8.5*  MG  --   --  2.0 2.0  GFR: Estimated Creatinine Clearance: 52.6 mL/min (A) (by C-G formula based on SCr of 1.38 mg/dL (H)). Liver Function Tests: Recent Labs  Lab 05/18/22 1430  AST 19  ALT 33  ALKPHOS 67  BILITOT 1.1  PROT 6.0*  ALBUMIN 3.2*   No results for input(s): "LIPASE", "AMYLASE" in the last 168 hours. No results for input(s): "AMMONIA" in the last 168 hours. Coagulation Profile: Recent Labs  Lab 05/18/22 1837  INR 1.8*   Cardiac Enzymes: No results for input(s): "CKTOTAL", "CKMB", "CKMBINDEX", "TROPONINI" in the last 168 hours. BNP (last 3 results) No results for input(s): "PROBNP" in the last 8760  hours. HbA1C: No results for input(s): "HGBA1C" in the last 72 hours. CBG: Recent Labs  Lab 05/20/22 1131 05/20/22 1616 05/20/22 2135 05/21/22 0549 05/21/22 1120  GLUCAP 125* 156* 120* 121* 140*   Lipid Profile: No results for input(s): "CHOL", "HDL", "LDLCALC", "TRIG", "CHOLHDL", "LDLDIRECT" in the last 72 hours. Thyroid Function Tests: No results for input(s): "TSH", "T4TOTAL", "FREET4", "T3FREE", "THYROIDAB" in the last 72 hours. Anemia Panel: No results for input(s): "VITAMINB12", "FOLATE", "FERRITIN", "TIBC", "IRON", "RETICCTPCT" in the last 72 hours. Urine analysis:    Component Value Date/Time   COLORURINE AMBER (A) 05/18/2022 1430   APPEARANCEUR HAZY (A) 05/18/2022 1430   APPEARANCEUR Cloudy 09/25/2013 2024   LABSPEC 1.020 05/18/2022 1430   LABSPEC 1.015 09/25/2013 2024   PHURINE 5.0 05/18/2022 1430   GLUCOSEU NEGATIVE 05/18/2022 1430   GLUCOSEU Negative 09/25/2013 2024   HGBUR SMALL (A) 05/18/2022 1430   BILIRUBINUR NEGATIVE 05/18/2022 1430   BILIRUBINUR Negative 09/25/2013 2024   KETONESUR NEGATIVE 05/18/2022 1430   PROTEINUR 100 (A) 05/18/2022 1430   NITRITE NEGATIVE 05/18/2022 1430   LEUKOCYTESUR SMALL (A) 05/18/2022 1430   LEUKOCYTESUR 3+ 09/25/2013 2024   Sepsis Labs: @LABRCNTIP (procalcitonin:4,lacticidven:4)  ) Recent Results (from the past 240 hour(s))  Resp Panel by RT-PCR (Flu A&B, Covid) Anterior Nasal Swab     Status: None   Collection Time: 05/18/22  2:57 PM   Specimen: Anterior Nasal Swab  Result Value Ref Range Status   SARS Coronavirus 2 by RT PCR NEGATIVE NEGATIVE Final    Comment: (NOTE) SARS-CoV-2 target nucleic acids are NOT DETECTED.  The SARS-CoV-2 RNA is generally detectable in upper respiratory specimens during the acute phase of infection. The lowest concentration of SARS-CoV-2 viral copies this assay can detect is 138 copies/mL. A negative result does not preclude SARS-Cov-2 infection and should not be used as the sole basis  for treatment or other patient management decisions. A negative result may occur with  improper specimen collection/handling, submission of specimen other than nasopharyngeal swab, presence of viral mutation(s) within the areas targeted by this assay, and inadequate number of viral copies(<138 copies/mL). A negative result must be combined with clinical observations, patient history, and epidemiological information. The expected result is Negative.  Fact Sheet for Patients:  05/20/22  Fact Sheet for Healthcare Providers:  BloggerCourse.com  This test is no t yet approved or cleared by the [employer] FDA and  has been authorized for detection and/or diagnosis of SARS-CoV-2 by FDA under an Emergency Use Authorization (EUA). This EUA will remain  in effect (meaning this test can be used) for the duration of the COVID-19 declaration under Section 564(b)(1) of the Act, 21 U.S.C.section 360bbb-3(b)(1), unless the authorization is terminated  or revoked sooner.       Influenza A by PCR NEGATIVE NEGATIVE Final   Influenza B by PCR NEGATIVE NEGATIVE Final  Comment: (NOTE) The Xpert Xpress SARS-CoV-2/FLU/RSV plus assay is intended as an aid in the diagnosis of influenza from Nasopharyngeal swab specimens and should not be used as a sole basis for treatment. Nasal washings and aspirates are unacceptable for Xpert Xpress SARS-CoV-2/FLU/RSV testing.  Fact Sheet for Patients: BloggerCourse.com  Fact Sheet for Healthcare Providers: [employer]it  This test is not yet approved or cleared by the [place] FDA and has been authorized for detection and/or diagnosis of SARS-CoV-2 by FDA under an Emergency Use Authorization (EUA). This EUA will remain in effect (meaning this test can be used) for the duration of the COVID-19 declaration under Section 564(b)(1) of the Act, 21  U.S.C. section 360bbb-3(b)(1), unless the authorization is terminated or revoked.  Performed at [facility], [address] [place], [place] [address]      Radiology Studies: DG Chest 1 View  Result Date: 05/21/2022 CLINICAL DATA:  Shortness of breath. EXAM: CHEST  1 VIEW COMPARISON:  05/20/2022. FINDINGS: Heart is enlarged the mediastinal contour stable. Sternotomy wires are present over the midline. There are small bilateral pleural effusions with atelectasis at the lung bases. No pneumothorax. No acute osseous abnormality. IMPRESSION: 1. Small bilateral pleural effusions with atelectasis at the lung bases, unchanged. 2. Cardiomegaly. Electronically Signed   By: Thornell Sartorius M.D.   On: 05/21/2022 00:46   DG Chest Port 1 View  Result Date: 05/20/2022 CLINICAL DATA:  Dyspnea, status post thoracentesis on 05/19/2022 EXAM: PORTABLE CHEST 1 VIEW COMPARISON:  Radiographs 05/19/2022 FINDINGS: Since 05/19/2022, slightly increased small left pleural effusion and associated atelectasis. Trace right pleural effusion is similar. No pneumothorax. Cardiomegaly. Sternotomy and CABG. No acute osseous abnormality. IMPRESSION: Slightly increased small left pleural effusion and associated atelectasis since 05/11/2022. Cardiomegaly. Electronically Signed   By: Minerva Fester M.D.   On: 05/20/2022 08:09     Scheduled Meds:  aspirin EC  81 mg Oral Daily   atorvastatin  40 mg Oral Daily   docusate sodium  100 mg Oral BID   empagliflozin  10 mg Oral Daily   feeding supplement  237 mL Oral BID BM   furosemide  40 mg Intravenous BID   insulin aspart  0-15 Units Subcutaneous TID WC   isosorbide mononitrate  15 mg Oral Daily   metoprolol tartrate  50 mg Oral BID   multivitamin with minerals  1 tablet Oral Daily   sodium chloride flush  3 mL Intravenous Q12H   Continuous Infusions:   LOS: 2 days    Time spent:  Zannie Cove, MD [facility]   05/21/2022, 2:18 PM

## 2022-05-21 NOTE — Consult Note (Addendum)
Cardiology Consultation   Patient ID: [name] Goodman MRN: [id number]; DOB: March 12, 1941  Admit date: 05/18/2022 Date of Consult: 05/21/2022  PCP:  [place] [facility], [place] [facility] Cardiologist:  Early Osmond, MD  (new, previously seen by [facility])    Patient Profile:   [name] Goodman is a 81 y.o. male with a hx of CABG on 05/08/2022, HTN, HLD, atrial fibrillation who is being seen 05/21/2022 for the evaluation of CHF at the request of Dr. Broadus John.  History of Present Illness:    [name] Goodman is an 81 year old male with above medical history.  He has been followed by [facility] cardiology.  Per chart review, patient has a known history of hypertension and hyperlipidemia.  He also has a family history of coronary artery disease with both his father and brother having coronary artery disease and history of MI.  Patient was referred to [facility] cardiology in 06/2019 for evaluation of chest discomfort.  His symptoms are thought to be atypical (occasional "twinges" in his chest).  However, he began to have increased fatigue with exercise so he underwent an ETT on 03/01/2020 which was equivocal.  There was discussion about pursuing a repeat functional study versus coronary CT with FFR, however neither were performed and patient was lost to follow-up.  Patient reestablish care with cardiology in 01/2022.  At that time, he complained of mild exertional chest pain.  Underwent coronary CT on 04/03/2022 which revealed severe, extensive mixed atherosclerotic plaque of the left main, LAD, left circumflex, and RCA with multiple areas of severe stenosis and near complete obstruction.  He underwent cardiac catheterization on 04/16/2022 which revealed 60-70% stenosis in the distal left main, 70% stenosis proximal and mid LAD, 80% stenosis ostial left circumflex, occluded small OM 2, 80% stenosis the distal RCA, 70 and 75% stenosis RPDA.  Left ventriculography revealed EF 40%.  Recommended  elective CABG, patient was referred to CT surgery.  Patient underwent CABGx5 (LIMA-LAD, SVG-posterior descending, SVG-first diagonal, sequential left radial artery graft to obtuse marginal and posterolateral) on 8/17.  Intraoperative TEE showed LVEF 40-45%. He tolerated the procedure well, and was extubated the evening of surgery.  He initially required neo and milrinone, however both were weaned overnight after surgery.  He developed paroxysmal atrial fibrillation postoperatively, originally started on amiodarone drip but developed significant bradycardia.  He started Cardizem and Eliquis, eventually converted to and maintained normal sinus rhythm prior to discharge.  Patient was discharged from the hospital on 05/14/2022.  Presented to the ED on 8/27 complaining of shortness of breath.  He reported that he was doing reasonably well since he was discharged after CABG, but he developed wheezing and increasing shortness of breath.  He felt extremely fatigued on 8/27 and his shortness of breath worsened.  Friend checked his O2 saturation which was in the 80s so he presented to the ED.   Initial workup in the ED was significant for hsTn 277>>255. BNP elevated to 961. CXR showed enlargement of the cardiac silhouette post CAGB, persistent left lower lobe atelectasis vs consolidation with small associated left pleural effusion. EKG showed atrial fibrillation, HR 93 BPM, PVC present, nonspecific ST changes.   CTA chest showed a segmental and subsegmental PE of the right middle lobe and right upper lobe, mild pulmonary edema, moderate right and large left pleural effusions with new complete collapse of the left lower lobe.   Patient was started on IV heparin for treatment of PE. He underwent left thoracentesis on 8/28  that yielded 1.6 L fluid.  He was started back on Eliquis after thoracentesis  Echocardiogram on 05/19/2022 showed EF 25-30%, abnormal (paradoxical) septal motion consistent with postoperative  status, mildly reduced RV systolic function.  Cardiology was asked to consult  On interview, patient reports that after he was discharged after his CABG, he was able to walk 5 minutes, 5 times per day without SOB, chest pain. He denies having any chest pain prior to CABG, and has not had any chest pain since his surgery. On 8/27, 4 days after surgery, he noticed that he had acutely worsening sob. Also felt profound weakness.   His SOB felt much better after his thoracentesis, but he has continued to have some orthopnea.   Past Medical History:  Diagnosis Date   Arthritis    History of kidney stones    Hyperlipidemia    Hypertension    Nocturia    S/P CABG (coronary artery bypass graft)     Past Surgical History:  Procedure Laterality Date   APPENDECTOMY     CATARACT EXTRACTION W/PHACO Right 11/27/2020   Procedure: CATARACT EXTRACTION PHACO AND INTRAOCULAR LENS PLACEMENT (IOC) RIGHT VIVITY LENS toric 6.31 00.41.8;  Surgeon: Birder Robson, MD;  Location: [place];  Service: Ophthalmology;  Laterality: Right;   CORONARY ARTERY BYPASS GRAFT N/A 05/08/2022   Procedure: CORONARY ARTERY BYPASS GRAFTING (CABG) TIMES FIVE USING ENDOSCOPICALLY HARVESTED RIGHT GREATER SAPHENOUS VEIN AND LEFT OPEN RADIAL HARVEST VEIN AND LEFT INTERNALLY MAMMARY ARTERY.;  Surgeon: Melrose Nakayama, MD;  Location: [place];  Service: Open Heart Surgery;  Laterality: N/A;   IR THORACENTESIS ASP PLEURAL SPACE W/IMG GUIDE  05/19/2022   JOINT REPLACEMENT     KNEE ARTHROSCOPY Left    KNEE ARTHROSCOPY WITH LATERAL MENISECTOMY Right 11/13/2020   Procedure: Right knee arthroscopy, partial medial and lateral meniscectomy;  Surgeon: Hessie Knows, MD;  Location: [facility];  Service: Orthopedics;  Laterality: Right;   LEFT HEART CATH AND CORONARY ANGIOGRAPHY N/A 04/16/2022   Procedure: LEFT HEART CATH AND CORONARY ANGIOGRAPHY;  Surgeon: Isaias Cowman, MD;  Location: [place] [facility];  Service:  Cardiovascular;  Laterality: N/A;   POLYPECTOMY     WITH COLONOSCOPY   RADIAL ARTERY HARVEST Left 05/08/2022   Procedure: RADIAL ARTERY HARVEST;  Surgeon: Melrose Nakayama, MD;  Location: [place];  Service: Open Heart Surgery;  Laterality: Left;   TEE WITHOUT CARDIOVERSION N/A 05/08/2022   Procedure: TRANSESOPHAGEAL ECHOCARDIOGRAM (TEE);  Surgeon: Melrose Nakayama, MD;  Location: [place];  Service: Open Heart Surgery;  Laterality: N/A;   TOTAL KNEE ARTHROPLASTY Left 07/23/2015   Procedure: LEFT TOTAL KNEE ARTHROPLASTY;  Surgeon: Gaynelle Arabian, MD;  Location: [facility];  Service: Orthopedics;  Laterality: Left;   TOTAL KNEE ARTHROPLASTY Right 06/11/2021   Procedure: TOTAL KNEE ARTHROPLASTY;  Surgeon: Hessie Knows, MD;  Location: [facility];  Service: Orthopedics;  Laterality: Right;   TRANSURETHRAL RESECTION OF PROSTATE  ?2015   URETEROSCOPY WITH HOLMIUM LASER LITHOTRIPSY       Home Medications:  Prior to Admission medications   Medication Sig Start Date End Date Taking? Authorizing Provider  apixaban (ELIQUIS) 5 MG TABS tablet Take 1 tablet (5 mg total) by mouth 2 (two) times daily. 05/14/22  Yes Barrett, Lodema Hong, PA-C  aspirin EC 81 MG tablet Take 81 mg by mouth daily.   Yes [provider]  atorvastatin (LIPITOR) 40 MG tablet Take 1 tablet (40 mg total) by mouth daily. 05/15/22  Yes Barrett, Lodema Hong,  PA-C  cyanocobalamin (VITAMIN B12) 1000 MCG tablet Take 1,000 mcg by mouth daily.   Yes [provider]  diltiazem (TIAZAC) 240 MG 24 hr capsule Take 240 mg by mouth daily. 03/26/21  Yes [provider]  fosinopril (MONOPRIL) 20 MG tablet Take 1 tablet (20 mg total) by mouth daily. 06/15/22  Yes Barrett, Erin R, PA-C  hydrochlorothiazide (HYDRODIURIL) 25 MG tablet Take 1 tablet (25 mg total) by mouth daily. 06/15/22  Yes Barrett, Erin R, PA-C  isosorbide mononitrate (IMDUR) 30 MG 24 hr tablet Take 1 tablet (30 mg total) by mouth daily. 05/15/22 06/14/22 Yes Barrett, Erin R,  PA-C  metoprolol tartrate (LOPRESSOR) 25 MG tablet Take 1 tablet (25 mg total) by mouth 2 (two) times daily. 05/14/22  Yes Barrett, Erin R, PA-C  traMADol (ULTRAM) 50 MG tablet Take 1 tablet (50 mg total) by mouth every 4 (four) hours as needed for moderate pain. 05/14/22  Yes Barrett, Rae Roam, PA-C    Inpatient Medications: Scheduled Meds:  aspirin EC  81 mg Oral Daily   atorvastatin  40 mg Oral Daily   docusate sodium  100 mg Oral BID   empagliflozin  10 mg Oral Daily   feeding supplement  237 mL Oral BID BM   furosemide  40 mg Intravenous BID   insulin aspart  0-15 Units Subcutaneous TID WC   isosorbide mononitrate  15 mg Oral Daily   metoprolol tartrate  50 mg Oral BID   multivitamin with minerals  1 tablet Oral Daily   sodium chloride flush  3 mL Intravenous Q12H   Continuous Infusions:  PRN Meds: bisacodyl, hydrALAZINE, ipratropium-albuterol, ondansetron **OR** ondansetron (ZOFRAN) IV, polyethylene glycol, traMADol, traZODone  Allergies:   No Known Allergies  Social History:   Social History   Socioeconomic History   Marital status: Divorced    Spouse name: Not on file   Number of children: 3   Years of education: Not on file   Highest education level: Master's degree (e.g., MA, MS, MEng, MEd, MSW, MBA)  Occupational History   Occupation: retired Psychologist, occupational at [employer]  Tobacco Use   Smoking status: Former    Years: 15.00    Types: Cigarettes    Quit date: 07/17/1975    Years since quitting: 46.8   Smokeless tobacco: Never  Vaping Use   Vaping Use: Never used  Substance and Sexual Activity   Alcohol use: No   Drug use: No   Sexual activity: Not on file  Other Topics Concern   Not on file  Social History Narrative   Lives alone   Social Determinants of Health   Financial Resource Strain: Low Risk  (05/20/2022)   Overall Financial Resource Strain (CARDIA)    Difficulty of Paying Living Expenses: Not hard at all  Food Insecurity: No Food Insecurity (05/20/2022)    Hunger Vital Sign    Worried About Running Out of Food in the Last Year: Never true    Ran Out of Food in the Last Year: Never true  Transportation Needs: No Transportation Needs (05/20/2022)   PRAPARE - Administrator, Civil Service (Medical): No    Lack of Transportation (Non-Medical): No  Physical Activity: Not on file  Stress: Not on file  Social Connections: Not on file  Intimate Partner Violence: Not on file    Family History:    Family History  Problem Relation Age of Onset   Hypertension Father    Cancer Father  ROS:  Please see the history of present illness.   All other ROS reviewed and negative.     Physical Exam/Data:   Vitals:   05/21/22 0321 05/21/22 0532 05/21/22 0834 05/21/22 1100  BP: 130/83  134/78 102/63  Pulse: 68  68   Resp: 20  19   Temp: (!) 97.5 F (36.4 C)  97.7 F (36.5 C) 97.7 F (36.5 C)  TempSrc: Oral  Oral Oral  SpO2: 100%  100%   Weight:  101.6 kg    Height:        Intake/Output Summary (Last 24 hours) at 05/21/2022 1408 Last data filed at 05/21/2022 1100 Gross per 24 hour  Intake 440 ml  Output 1650 ml  Net -1210 ml      05/21/2022    5:32 AM 05/18/2022    9:20 PM 05/18/2022    2:53 PM  Last 3 Weights  Weight (lbs) 224 lb 228 lb 9.9 oz 212 lb  Weight (kg) 101.606 kg 103.7 kg 96.163 kg     Body mass index is 29.55 kg/m.  General:  Well nourished, well developed, in no acute distress. Sitting upright in the chair HEENT: normal Neck: no JVD Vascular: Radial pulses 2+ bilaterally Cardiac:  normal S1, S2; RRR; no murmur  Lungs:  clear to auscultation bilaterally, no wheezing, rhonchi or rales  Abd: mildly distended, nontender to palpation  Ext: 2+ pitting edema in BLE, to the knees  Musculoskeletal:  No deformities, BUE and BLE strength normal and equal Skin: warm and dry  Neuro:  CNs 2-12 intact, no focal abnormalities noted Psych:  Normal affect   EKG:  The EKG was personally reviewed and demonstrates:  NSR  with PVCs, T wave inversions in the lateral leads  Telemetry:  Telemetry was personally reviewed and demonstrates:  Patient was in atrial fibrillation with HR in the 90s until 8/29 at 2250 at which time patient converted to NSR with HR in the 70s  Relevant CV Studies:  Echocardiogram limited Jun 01, 2022  1. Left ventricular ejection fraction, by estimation, is 25 to 30%. The  left ventricle has severely decreased function.   2. Right ventricular systolic function is mildly reduced. The right  ventricular size is normal.   3. The mitral valve is grossly normal.   Comparison(s): Compared to TEE on 05/08/22, the LVEF appears to have  dropped to 25-30% from 40-45%.    Laboratory Data:  High Sensitivity Troponin:   Recent Labs  Lab 05/18/22 1430 05/18/22 1722  TROPONINIHS 277* 255*     Chemistry Recent Labs  Lab 01-Jun-2022 0223 05/20/22 0256 05/21/22 0313  NA 138 136 138  K 4.0 3.3* 3.8  CL 103 105 105  CO2 25 22 23   GLUCOSE 147* 161* 156*  BUN 22 26* 25*  CREATININE 1.47* 1.43* 1.38*  CALCIUM 8.5* 8.2* 8.5*  MG  --  2.0 2.0  GFRNONAA 48* 49* 51*  ANIONGAP 10 9 10     Recent Labs  Lab 05/18/22 1430  PROT 6.0*  ALBUMIN 3.2*  AST 19  ALT 33  ALKPHOS 67  BILITOT 1.1   Lipids No results for input(s): "CHOL", "TRIG", "HDL", "LABVLDL", "LDLCALC", "CHOLHDL" in the last 168 hours.  Hematology Recent Labs  Lab 06-01-22 0223 05/20/22 0256 05/21/22 0313  WBC 12.3* 10.4 10.3  RBC 3.77* 3.74* 3.74*  HGB 11.2* 11.1* 11.1*  HCT 35.8* 34.4* 35.8*  MCV 95.0 92.0 95.7  MCH 29.7 29.7 29.7  MCHC 31.3 32.3 31.0  RDW 14.9  15.0 14.9  PLT 297 266 321   Thyroid No results for input(s): "TSH", "FREET4" in the last 168 hours.  BNP Recent Labs  Lab 05/18/22 1430 05/21/22 0313  BNP 961.0* 576.9*    DDimer No results for input(s): "DDIMER" in the last 168 hours.   Radiology/Studies:  DG Chest 1 View  Result Date: 05/21/2022 CLINICAL DATA:  Shortness of breath. EXAM: CHEST   1 VIEW COMPARISON:  05/20/2022. FINDINGS: Heart is enlarged the mediastinal contour stable. Sternotomy wires are present over the midline. There are small bilateral pleural effusions with atelectasis at the lung bases. No pneumothorax. No acute osseous abnormality. IMPRESSION: 1. Small bilateral pleural effusions with atelectasis at the lung bases, unchanged. 2. Cardiomegaly. Electronically Signed   By: Brett Fairy M.D.   On: 05/21/2022 00:46   DG Chest Port 1 View  Result Date: 05/20/2022 CLINICAL DATA:  Dyspnea, status post thoracentesis on 05/19/2022 EXAM: PORTABLE CHEST 1 VIEW COMPARISON:  Radiographs 05/19/2022 FINDINGS: Since 05/19/2022, slightly increased small left pleural effusion and associated atelectasis. Trace right pleural effusion is similar. No pneumothorax. Cardiomegaly. Sternotomy and CABG. No acute osseous abnormality. IMPRESSION: Slightly increased small left pleural effusion and associated atelectasis since 05/11/2022. Cardiomegaly. Electronically Signed   By: Placido Sou M.D.   On: 05/20/2022 08:09   IR THORACENTESIS ASP PLEURAL SPACE W/IMG GUIDE  Result Date: 05/19/2022 INDICATION: Patient with a history of CABG x5 May 08, 2022. Patient presented to the ED 05/18/2022 with complaints of shortness of breath and fatigue with decreased O2 saturations. He was found to have PE and pleural effusions. Interventional radiology asked to perform a therapeutic thoracentesis. EXAM: ULTRASOUND GUIDED THORACENTESIS MEDICATIONS: 1% lidocaine 10 mL COMPLICATIONS: None immediate. PROCEDURE: An ultrasound guided thoracentesis was thoroughly discussed with the patient and questions answered. The benefits, risks, alternatives and complications were also discussed. The patient understands and wishes to proceed with the procedure. Written consent was obtained. Ultrasound was performed to localize and mark an adequate pocket of fluid in the left chest. The area was then prepped and draped in the  normal sterile fashion. 1% Lidocaine was used for local anesthesia. Under ultrasound guidance a 6 Fr Safe-T-Centesis catheter was introduced. Thoracentesis was performed. The catheter was removed and a dressing applied. FINDINGS: A total of approximately 1.6 L of blood-tinged fluid was removed. IMPRESSION: Successful ultrasound guided left thoracentesis yielding 1.6 L of pleural fluid. Read by: Soyla Dryer, NP Electronically Signed   By: Lucrezia Europe M.D.   On: 05/19/2022 12:37   ECHOCARDIOGRAM LIMITED  Result Date: 05/19/2022    ECHOCARDIOGRAM LIMITED REPORT   Patient Name:   [name] Goodman Date of Exam: 05/19/2022 Medical Rec #:  [id number]            Height:       73.0 in Accession #:    [id number]           Weight:       228.6 lb Date of Birth:  08/30/1941            BSA:          2.277 m Patient Age:    86 years             BP:           139/106 mmHg Patient Gender: M                    HR:  90 bpm. Exam Location:  Inpatient Procedure: Limited Echo, Cardiac Doppler and Limited Color Doppler Indications:    PE  History:        Patient has prior history of Echocardiogram examinations, most                 recent 05/08/2022. CHF, CAD, Prior CABG, TIA, Arrythmias:Atrial                 Fibrillation and Bradycardia; Risk Factors:Hypertension,                 Non-Smoker and Dyslipidemia.  Sonographer:    Aron Baba Referring Phys: [id number] JENNIFER YATES  Sonographer Comments: CABG Inscision. IMPRESSIONS  1. Left ventricular ejection fraction, by estimation, is 25 to 30%. The left ventricle has severely decreased function.  2. Right ventricular systolic function is mildly reduced. The right ventricular size is normal.  3. The mitral valve is grossly normal. Comparison(s): Compared to TEE on 05/08/22, the LVEF appears to have dropped to 25-30% from 40-45%. FINDINGS  Left Ventricle: Left ventricular ejection fraction, by estimation, is 25 to 30%. The left ventricle has severely decreased function. The  left ventricular internal cavity size was normal in size. There is no left ventricular hypertrophy. Abnormal (paradoxical) septal motion consistent with post-operative status. Right Ventricle: The right ventricular size is normal. Right ventricular systolic function is mildly reduced. Mitral Valve: The mitral valve is grossly normal. Tricuspid Valve: The tricuspid valve is normal in structure. Tricuspid valve regurgitation is trivial. LEFT VENTRICLE PLAX 2D LVIDd:         5.20 cm LVIDs:         4.90 cm LV PW:         1.10 cm LV IVS:        1.00 cm  RIGHT VENTRICLE RV Basal diam:  4.30 cm RV Mid diam:    2.80 cm RV S prime:     8.81 cm/s TAPSE (M-mode): 0.5 cm LEFT ATRIUM         Index       RIGHT ATRIUM           Index LA diam:    4.00 cm 1.76 cm/m  RA Area:     23.30 cm                                 RA Volume:   68.10 ml  29.90 ml/m Laurance Flatten MD Electronically signed by Laurance Flatten MD Signature Date/Time: 05/19/2022/12:26:44 PM    Final    DG Chest 1 View  Result Date: 05/19/2022 CLINICAL DATA:  Status post left thoracentesis. EXAM: CHEST  1 VIEW COMPARISON:  May 18, 2022. FINDINGS: There is no definite pneumothorax status post left thoracentesis. Left pleural effusion is smaller. IMPRESSION: No definite pneumothorax status post left thoracentesis. Electronically Signed   By: Lupita Raider M.D.   On: 05/19/2022 09:32   CT Angio Chest PE W and/or Wo Contrast  Result Date: 05/18/2022 CLINICAL DATA:  Shortness of breath EXAM: CT ANGIOGRAPHY CHEST WITH CONTRAST TECHNIQUE: Multidetector CT imaging of the chest was performed using the standard protocol during bolus administration of intravenous contrast. Multiplanar CT image reconstructions and MIPs were obtained to evaluate the vascular anatomy. RADIATION DOSE REDUCTION: This exam was performed according to the departmental dose-optimization program which includes automated exposure control, adjustment of the mA and/or kV according to  patient size and/or use  of iterative reconstruction technique. CONTRAST:  40mL OMNIPAQUE IOHEXOL 350 MG/ML SOLN COMPARISON:  None Available. FINDINGS: Cardiovascular: Segmental and subsegmental pulmonary embolus is seen in the right middle lobe and right upper lobe. Cardiomegaly with no evidence of right heart strain, RV/LV ratio of 0.9. Trace pericardial effusion. Severe left main and three-vessel coronary artery calcifications status post CABG. Normal caliber thoracic aorta with mild calcified plaque. Mediastinum/Nodes: Esophagus and thyroid are unremarkable. Mild fat stranding of the anterior mediastinum. No pathologically enlarged lymph nodes seen in the chest. Lungs/Pleura: Central airways are patent. Bibasilar atelectasis. Mild ground-glass opacities which are most pronounced in the upper lungs. Moderate right and large left pleural effusions. Near complete collapse of the left lower lobe. Upper Abdomen: Partially visualized simple appearing left renal cysts, further follow-up imaging is recommended Musculoskeletal: Prior median sternotomy with intact sternal wires. No acute osseous abnormality. Review of the MIP images confirms the above findings. IMPRESSION: 1. Segmental and subsegmental pulmonary embolus of the right middle lobe and right upper lobe. 2. Mild fat stranding of the anterior mediastinum, likely postsurgical changes related to recent CABG. No organized fluid collection 3. Mild pulmonary edema. 4. Moderate right and large left pleural effusions with near complete collapse of the left lower lobe. 5. Cardiomegaly and aortic Atherosclerosis (ICD10-I70.0). Critical Value/emergent results were called by telephone at the time of interpretation on 05/18/2022 at 5:08 pm to provider [facility] , who verbally acknowledged these results. Electronically Signed   By: Yetta Glassman M.D.   On: 05/18/2022 17:11   DG Chest Port 1 View  Result Date: 05/18/2022 CLINICAL DATA:  Shortness of breath,  chest pain EXAM: PORTABLE CHEST 1 VIEW COMPARISON:  Portable exam 1529 hours compared to 05/13/2022 FINDINGS: Enlargement of cardiac silhouette post CABG. Mediastinal contours and pulmonary vascularity normal. Persistent atelectasis versus consolidation LEFT lower lobe. Small LEFT pleural effusion. Persistent accentuation of perihilar interstitial markings without acute infiltrate or pneumothorax. Bones demineralized. IMPRESSION: Enlargement of cardiac silhouette post CABG. Persistent LEFT lower lobe atelectasis versus consolidation with small associated LEFT pleural effusion. Electronically Signed   By: Lavonia Dana M.D.   On: 05/18/2022 15:44     Assessment and Plan:   Acute on Chronic HFrEF  Bilateral Pleural Effusions  Ischemic Cardiomyopathy  - Intraoperative TEE on 8/17 during CABG showed EF 40-45% - Echo this admission with EF 25-30%, abnormal septal motion consistent with post-operative status, mildly reduced RV systolic function  - CTA chest on presentation showed mild pulmonary edema, moderate right and large left pleural effusions, segmental and subsegmental pulmonary embolus of the right middle lobe and right upper lobe  - Underwent left thoracentesis on 8/27 with improvement in symptoms- yielded 1.6 L fluid  - Has been diuresing on IV lasix 40 mg daily. Continues to have ankle edema, crackles in lungs on exam.  - Agree with increasing lasix to BID dosing  - Started on jardiance 10 mg daily - Consolidate metoprolol tartrate 50 mg BID to metoprolol succinate tomorrow  - GDMT somewhat limited by BP for now, can attempt to add low dose losartan this admission with plans to transition to entresto in the future - I am concerned that given newly reduced EF compared to pre-CABG, he may have had graft failure. Will discuss with MD plans for cath - Hold eliquis, start heparin per pharmacy prior to cath   CAD s/p CABG 05/08/2022 - Patient underwent CABG x5 on 05/08/2022 with LIMA-LAD,  SVG-posterior descending, SVG-first diagonal, sequential left radial artery graft to obtuse  marginal and posterolateral - Continue statin, bb, asa - Consider graft failure as above given reduced EF   Atrial Fibrillation  - Patient was on Cardizem prior to admission-- this has been stopped due to low EF - Per telemetry, patient converted to NSR overnight. He is maintaining NSR   - Consolidate metoprolol tartrate 50 mg BID to once daily metoprolol succinate tomorrow  - He was transitioned to eliquis post thoracentesis, however now we are considering cath to check for graft patency. Hold eliquis for now, transition back to IV heparin   HTN  - Stopped home HCTZ and diltiazem  - Consolidate metoprolol tartrate 50 mg BID to once daily metoprolol succinate tomorrow   Otherwise per primary  - Acute PE   Risk Assessment/Risk Scores:      New York Heart Association (NYHA) Functional Class NYHA Class IV  CHA2DS2-VASc Score = 5  This indicates a 7.2% annual risk of stroke. The patient's score is based upon: CHF History: 1 HTN History: 1 Diabetes History: 0 Stroke History: 0 Vascular Disease History: 1 Age Score: 2 Gender Score: 0        For questions or updates, please contact [place] Please consult [email or web address] for contact info under    Signed, Margie Billet, PA-C  05/21/2022 2:08 PM   ATTENDING ATTESTATION:  After conducting a review of all available clinical information with the care team, interviewing the patient, and performing a physical exam, I agree with the findings and plan described in this note.   GEN: No acute distress.   HEENT:  MMM, no JVD, no scleral icterus Cardiac: RRR, no murmurs, rubs, or gallops.  Respiratory: Clear to auscultation bilaterally. GI: Soft, nontender, non-distended  MS: +2 edema; No deformity. Neuro:  Nonfocal  Vasc:  +2 R radial pulse; s/p L radial harvest  The patient is an 81 year old male with a history of  coronary artery disease status post 5 vessel CABG consisting of a LIMA to LAD, vein graft to PDA, vein graft to first diagonal, and sequential left radial graft to obtuse marginal in posterolateral branch earlier this month, hypertension, and hyperlipidemia who was admitted a few days ago with acute on chronic systolic heart failure.  The patient tells me he was feeling unwell on Sunday with increasing shortness of breath.  In the emergency department his chest CT showed small subsegmental pulmonary embolism of the right upper and middle lobes.  His BNP was elevated as well as his troponins but these were downtrending.  He underwent thoracentesis due to a left-sided pleural effusion with 1.6 L removed.  His echocardiogram that I reviewed demonstrated a new cardiomyopathy with an ejection fraction of 25 to 30% with no significant valvular abnormalities.  His RV demonstrates mild dysfunction.    Given the patient's new cardiomyopathy I am concerned about early graft failure.  For this reason we will refer the patient for coronary and bypass angiography as well as a right heart catheterization.  We will continue to optimize his medical therapy due to his new cardiomyopathy.  We will stop his Eliquis.  He is received 2 doses.  Given the fact that his left radial artery was harvested a femoral approach will need to be pursued.  Continue IV Lasix 40 mg twice daily, Jardiance, will discontinue Imdur which will allow more room to add goal-directed medical therapy.  We will change Lopressor 50 mg twice daily to Toprol-XL 75 mg at bedtime.  We will start losartan 25 mg at bedtime.  We will consider spironolactone later.  Monitor cr.  Lenna Sciara, MD Pager [phone]

## 2022-05-21 NOTE — Plan of Care (Signed)
  Problem: Activity: Goal: Risk for activity intolerance will decrease Outcome: Progressing   

## 2022-05-21 NOTE — Progress Notes (Signed)
Patient ambulated to bathroom. Audible wheezing heard.

## 2022-05-21 NOTE — Progress Notes (Signed)
Mobility Specialist: Progress Note   05/21/22 1221  Mobility  Activity Ambulated with assistance in hallway  Level of Assistance Standby assist, set-up cues, supervision of patient - no hands on  Assistive Device None  Distance Ambulated (ft) 330 ft  Activity Response Tolerated well  $Mobility charge 1 Mobility   Pre-Mobility: 68 HR, 97% SpO2 Post-Mobility: 79 HR, 99% SpO2  Received pt in chair having no complaints and agreeable to mobility. Pt was asymptomatic throughout ambulation and returned to room w/o fault. Left in chair w/ call bell in reach and all needs met.  [facility] [name] Goodman Mobility Specialist Mobility Specialist [address]: [phone]

## 2022-05-21 NOTE — Evaluation (Signed)
Physical Therapy Evaluation & Discharge Patient Details Name: [name] Goodman MRN: [id number] DOB: Nov 28, 1940 Today's Date: 05/21/2022  History of Present Illness  Pt is an 81 y.o. male s/p recent CABG (05/08/22), now admitted 05/18/22 with SOB; workup for PE, pleural effusion. Pt with worsening SOB overnight 8/29; CXR with bilateral pleural effusions mostly unchanged. PMH includes HTN, CHF, CAD, afib, HLD, BPH, bilateral TKA.   Clinical Impression  Patient evaluated by Physical Therapy with no further acute PT needs identified. PTA, pt recently d/c home s/p CABG, requiring intermittent assist from significant other for mobility and ADL tasks. Today, pt independent with activity, ambulating without DME. Reviewed educ re: precautions, activity recommendations, importance of mobility. All education has been completed and the patient has no further questions. Acute PT is signing off. Thank you for this referral.  SpO2 94-99% on RA when pleth reliable   Recommendations for follow up therapy are one component of a multi-disciplinary discharge planning process, led by the attending physician.  Recommendations may be updated based on patient status, additional functional criteria and insurance authorization.  Follow Up Recommendations Home health PT      Assistance Recommended at Discharge PRN  Patient can return home with the following  A little help with bathing/dressing/bathroom;Assistance with cooking/housework;Assist for transportation    Equipment Recommendations None recommended by PT  Recommendations for Other Services       Functional Status Assessment       Precautions / Restrictions Precautions Precautions: Sternal      Mobility  Bed Mobility               General bed mobility comments: received sitting in recliner    Transfers Overall transfer level: Independent Equipment used: None Transfers: Sit to/from Stand                   Ambulation/Gait Ambulation/Gait assistance: Independent Social research officer, government (Feet): 400 Feet Assistive device: None Gait Pattern/deviations: Step-through pattern, Decreased stride length Gait velocity: Decreased     General Gait Details: slow, steady gait indep without DME  Stairs            Wheelchair Mobility    Modified Rankin (Stroke Patients Only)       Balance Overall balance assessment: Modified Independent   Sitting balance-Leahy Scale: Good       Standing balance-Leahy Scale: Good Standing balance comment: can static stand to don shoes, stability improved with single UE support to complete this task which pt adjusts for appropriately             High level balance activites: Backward walking, Direction changes, Sudden stops, Turns, Head turns High Level Balance Comments: mild instability with initial higher level balance tasks, no overt LOB             Pertinent Vitals/Pain Pain Assessment Pain Assessment: No/denies pain    Home Living Family/patient expects to be discharged to:: Private residence Living Arrangements: Spouse/significant other Available Help at Discharge: Friend(s);Available 24 hours/day Type of Home: House Home Access: Stairs to enter Entrance Stairs-Rails: Left Entrance Stairs-Number of Steps: 3   Home Layout: One level Home Equipment: Conservation officer, nature (2 wheels);Cane - single point      Prior Function Prior Level of Function : Independent/Modified Independent             Mobility Comments: typically independent without DME; was needing a little help to stand from low chairs since CABG ADLs Comments: Wife assists with LB dressing as needed  Hand Dominance   Dominant Hand: Right    Extremity/Trunk Assessment   Upper Extremity Assessment Upper Extremity Assessment: Overall WFL for tasks assessed    Lower Extremity Assessment Lower Extremity Assessment: Overall WFL for tasks assessed       Communication    Communication: No difficulties  Cognition Arousal/Alertness: Awake/alert Behavior During Therapy: WFL for tasks assessed/performed Overall Cognitive Status: Within Functional Limits for tasks assessed                                 General Comments: pt remains frustrated regarding change in medical status and need for new medications, etc.        General Comments General comments (skin integrity, edema, etc.): difficulty getting reliable SpO2 reading during ambulation; SpO2 94-99% on RA at rest, HR 70s-90s    Exercises     Assessment/Plan    PT Assessment All further PT needs can be met in the next venue of care  PT Problem List Decreased mobility;Decreased activity tolerance;Decreased balance       PT Treatment Interventions      PT Goals (Current goals can be found in the Care Plan section)  Acute Rehab PT Goals PT Goal Formulation: All assessment and education complete, DC therapy    Frequency       Co-evaluation               AM-PAC PT "6 Clicks" Mobility  Outcome Measure Help needed turning from your back to your side while in a flat bed without using bedrails?: None Help needed moving from lying on your back to sitting on the side of a flat bed without using bedrails?: None Help needed moving to and from a bed to a chair (including a wheelchair)?: None Help needed standing up from a chair using your arms (e.g., wheelchair or bedside chair)?: None Help needed to walk in hospital room?: None Help needed climbing 3-5 steps with a railing? : A Little 6 Click Score: 23    End of Session Equipment Utilized During Treatment: Gait belt Activity Tolerance: Patient tolerated treatment well Patient left: in chair;with call bell/phone within reach Nurse Communication: Mobility status PT Visit Diagnosis: Other abnormalities of gait and mobility (R26.89);Muscle weakness (generalized) (M62.81)    Time: 3833-3832 PT Time Calculation (min) (ACUTE  ONLY): 21 min   Charges:   PT Evaluation $PT Eval Moderate Complexity: [place], PT, DPT Acute Rehabilitation Services  Personal: [facility] [facility] Office: [place] 05/21/2022, 9:16 AM

## 2022-05-21 NOTE — Progress Notes (Addendum)
ANTICOAGULATION CONSULT NOTE  Pharmacy Consult for Heparin/Eliquis Indication: New PE (CABG 04/2022 discharged on Eliquis for Afib)  No Known Allergies  Patient Measurements: Height: 6\' 1"  (185.4 cm) Weight: 101.6 kg (224 lb) IBW/kg (Calculated) : 79.9 Heparin Dosing Weight: 96.2 kg  Vital Signs: Temp: 97.7 F (36.5 C) (08/30 1100) Temp Source: Oral (08/30 1100) BP: 102/63 (08/30 1100) Pulse Rate: 68 (08/30 0834)  Labs: Recent Labs    05/18/22 1722 05/18/22 1837 05/18/22 1837 05/19/22 0223 05/19/22 1014 05/20/22 0256 05/21/22 0313  HGB  --   --    < > 11.2*  --  11.1* 11.1*  HCT  --   --   --  35.8*  --  34.4* 35.8*  PLT  --   --   --  297  --  266 321  APTT  --  36   < > 73* 69* 70*  --   LABPROT  --  20.9*  --   --   --   --   --   INR  --  1.8*  --   --   --   --   --   HEPARINUNFRC  --  >1.10*  --  >1.10*  --  >1.10*  --   CREATININE  --   --   --  1.47*  --  1.43* 1.38*  TROPONINIHS 255*  --   --   --   --   --   --    < > = values in this interval not displayed.     Estimated Creatinine Clearance: 52.6 mL/min (A) (by C-G formula based on SCr of 1.38 mg/dL (H)).   Medical History: Past Medical History:  Diagnosis Date   Arthritis    History of kidney stones    Hyperlipidemia    Hypertension    Nocturia    S/P CABG (coronary artery bypass graft)     Medications:  Medications Prior to Admission  Medication Sig Dispense Refill Last Dose   apixaban (ELIQUIS) 5 MG TABS tablet Take 1 tablet (5 mg total) by mouth 2 (two) times daily. 60 tablet 3 05/18/2022 at 7:00am   aspirin EC 81 MG tablet Take 81 mg by mouth daily.   05/18/2022   atorvastatin (LIPITOR) 40 MG tablet Take 1 tablet (40 mg total) by mouth daily. 30 tablet 3 05/18/2022   cyanocobalamin (VITAMIN B12) 1000 MCG tablet Take 1,000 mcg by mouth daily.   05/18/2022   diltiazem (TIAZAC) 240 MG 24 hr capsule Take 240 mg by mouth daily.   05/18/2022   [START ON 06/15/2022] fosinopril (MONOPRIL) 20 MG  tablet Take 1 tablet (20 mg total) by mouth daily.   05/18/2022   [START ON 06/15/2022] hydrochlorothiazide (HYDRODIURIL) 25 MG tablet Take 1 tablet (25 mg total) by mouth daily.   05/18/2022   isosorbide mononitrate (IMDUR) 30 MG 24 hr tablet Take 1 tablet (30 mg total) by mouth daily. 30 tablet 0 05/18/2022   metoprolol tartrate (LOPRESSOR) 25 MG tablet Take 1 tablet (25 mg total) by mouth 2 (two) times daily. 60 tablet 3 05/18/2022 at 7:00am   traMADol (ULTRAM) 50 MG tablet Take 1 tablet (50 mg total) by mouth every 4 (four) hours as needed for moderate pain. 30 tablet 0 Past Week    Assessment: 32 yom with a history of SOB, dry cough, fatigue, peripheral edema and hypoxia. Patient is presenting with SOB. Eliquis per pharmacy consult placed for new PE.  Pt is s/p CABG on  8/17. Discharged on eliquis 5mg  BID for AF. Last dose 8/27 0700.   Plan for possible cath so will change from apixaban to heparin. Last dose of apixaban on 8/30@0935 . Hgb 11.1, plt 321. Was on previously therapeutic rate of heparin at 1600 units/hr prior to transition yesterday.   Goal of Therapy:  Heparin level 0.3-0.7 units/ml aPTT 66-102 seconds Monitor platelets by anticoagulation protocol: Yes   Plan:  Stop apixaban  Start heparin infusion at 1600 units/hr on 8/30@2200  Order heparin level and aPTT 8 hr after restart  Monitor for signs/symptoms of bleeding.  9/30, PharmD, BCCCP Clinical Pharmacist  Phone: [phone] 05/21/2022 3:06 PM  Please check AMION for all [facility] Pharmacy phone numbers After 10:00 PM, call Main Pharmacy [phone]

## 2022-05-21 NOTE — Progress Notes (Signed)
Assisted patient to BR. Patient had BM and observed bright red blood in toilet. Patient stated that, "he has hemorrhoids and sometimes have blood in the toilet at home".   Will continue to monitor

## 2022-05-21 NOTE — Progress Notes (Signed)
HOSPITAL MEDICINE OVERNIGHT EVENT NOTE    Notified by nursing that patient has been complaining of increasing shortness of breath.  Patient denies any associated chest pain.  Patient states that his shortness of breath is worse with lying flat but much improved upon sitting up in the recliner.  STAT chest x-ray obtained bilateral pleural effusions that appear mostly unchanged.  ECG was obtained revealing no dynamic ST change revealing NSR.    I went to evaluate the patient who was sitting up in a recliner at the time.  On exam he exhibited bibasilar rales and substantial bilateral lower extremity pitting edema. JVP was slightly elevated at 90 degrees.    Considering the substantial improvement in symptoms with sitting up, the rales on lung exam and severe B/L LE edema will administer 40mg  IV Lasix.  Will monitor for clinical response.      MD [facility]

## 2022-05-21 NOTE — Plan of Care (Signed)
  Problem: Activity: Goal: Risk for activity intolerance will decrease Outcome: Progressing   Problem: Skin Integrity: Goal: Risk for impaired skin integrity will decrease Outcome: Progressing   

## 2022-05-22 ENCOUNTER — Inpatient Hospital Stay (HOSPITAL_COMMUNITY): Payer: PPO

## 2022-05-22 DIAGNOSIS — I5021 Acute systolic (congestive) heart failure: Secondary | ICD-10-CM | POA: Diagnosis not present

## 2022-05-22 LAB — GLUCOSE, CAPILLARY
Glucose-Capillary: 111 mg/dL — ABNORMAL HIGH (ref 70–99)
Glucose-Capillary: 133 mg/dL — ABNORMAL HIGH (ref 70–99)
Glucose-Capillary: 102 mg/dL — ABNORMAL HIGH (ref 70–99)
Glucose-Capillary: 163 mg/dL — ABNORMAL HIGH (ref 70–99)

## 2022-05-22 LAB — BASIC METABOLIC PANEL
Anion gap: 11 (ref 5–15)
BUN: 26 mg/dL — ABNORMAL HIGH (ref 8–23)
CO2: 26 mmol/L (ref 22–32)
Calcium: 8.8 mg/dL — ABNORMAL LOW (ref 8.9–10.3)
Chloride: 103 mmol/L (ref 98–111)
Creatinine, Ser: 1.57 mg/dL — ABNORMAL HIGH (ref 0.61–1.24)
GFR, Estimated: 44 mL/min — ABNORMAL LOW (ref >60)
Glucose, Bld: 116 mg/dL — ABNORMAL HIGH (ref 70–99)
Potassium: 3.9 mmol/L (ref 3.5–5.1)
Sodium: 140 mmol/L (ref 135–145)

## 2022-05-22 LAB — HEPARIN LEVEL (UNFRACTIONATED): Heparin Unfractionated: >1.1 IU/mL — ABNORMAL HIGH (ref 0.30–0.70)

## 2022-05-22 LAB — MAGNESIUM: Magnesium: 2 mg/dL (ref 1.7–2.4)

## 2022-05-22 LAB — APTT
aPTT: 109 seconds — ABNORMAL HIGH (ref 24–36)
aPTT: 62 seconds — ABNORMAL HIGH (ref 24–36)

## 2022-05-22 MED ORDER — SODIUM CHLORIDE 0.9 % IV SOLN: 250.0000 mL | INTRAVENOUS | Status: DC | PRN

## 2022-05-22 MED ORDER — SODIUM CHLORIDE 0.9% FLUSH: 3.0000 mL | INTRAVENOUS | Status: DC | PRN

## 2022-05-22 MED ORDER — SODIUM CHLORIDE 0.9 % IV SOLN: INTRAVENOUS | Status: DC

## 2022-05-22 NOTE — Progress Notes (Signed)
Procedure(s) (LRB): RIGHT/LEFT HEART CATH AND CORONARY/GRAFT ANGIOGRAPHY (N/A) Subjective: Patient examined and images of echocardiogram, chest CT scan and chest x-rays before and after Left thoracentesis all personally reviewed.  Patient feeling much better without shortness of breath and walking up and down the hallway after left thoracentesis and removal of 1.6 L of serosanguineous fluid related to postoperative pleural inflammation and Eliquis.  The small right middle lobe PE may have contributed to his dyspnea as well. High sensitive troponin mildly elevated on post op re-admission and re-cath being planned for tomorrow.  Patient has denied any chest pain associated with the readmission.  Surgical incisions are all healing well. He has lost 10 pounds with diuresis since admission. Objective: Vital signs in last 24 hours: Temp:  [97.8 F (36.6 C)-98.2 F (36.8 C)] 98.2 F (36.8 C) (08/31 1116) Pulse Rate:  [71-82] 79 (08/31 1116) Cardiac Rhythm: Normal sinus rhythm (08/31 1116) Resp:  [17-19] 17 (08/31 1116) BP: (109-140)/(66-88) 109/73 (08/31 1116) SpO2:  [96 %-98 %] 98 % (08/31 1116) Weight:  [97.8 kg] 97.8 kg (08/31 0616)  Hemodynamic parameters for last 24 hours:    Intake/Output from previous day: 08/30 0701 - 08/31 0700 In: 449.7 [P.O.:440; I.V.:9.7] Out: 4155 [Urine:4155] Intake/Output this shift: Total I/O In: -  Out: 700 [Urine:700]  Exam Blood pressure 109/73, pulse 79, temperature 98.2 F (36.8 C), temperature source Oral, resp. rate 17, height 6\' 1"  (1.854 m), weight 97.8 kg, SpO2 98 %.   Alert and comfortable Lungs clear Sternal incision well-healed Regular rhythm with PVCs Mild leg edema right greater than left Neuro intact Lab Results: Recent Labs    05/20/22 0256 05/21/22 0313  WBC 10.4 10.3  HGB 11.1* 11.1*  HCT 34.4* 35.8*  PLT 266 321   BMET:  Recent Labs    05/21/22 0313 05/22/22 0603  NA 138 140  K 3.8 3.9  CL 105 103  CO2 23  26  GLUCOSE 156* 116*  BUN 25* 26*  CREATININE 1.38* 1.57*  CALCIUM 8.5* 8.8*    PT/INR: No results for input(s): "LABPROT", "INR" in the last 72 hours. ABG    Component Value Date/Time   PHART 7.362 05/09/2022 0009   HCO3 22.8 05/09/2022 0009   TCO2 24 05/09/2022 0009   ACIDBASEDEF 2.0 05/09/2022 0009   O2SAT 97 05/09/2022 0009   CBG (last 3)  Recent Labs    05/21/22 2115 05/22/22 0612 05/22/22 1123  GLUCAP 118* 111* 133*    Assessment/Plan: S/P Procedure(s) (LRB): RIGHT/LEFT HEART CATH AND CORONARY/GRAFT ANGIOGRAPHY (N/A)  We will repeat chest x-ray to assess for recurrent left effusion prior to discharge   05/24/22 05/22/2022

## 2022-05-22 NOTE — Progress Notes (Addendum)
PROGRESS NOTE    JAHSAI PENNACHIO  [id number] DOB: [birth year] DOA: 05/18/2022 PCP: [place] Pink, MD   81 y.o. male with medical history significant of HTN and HLD presenting with SOB.  He was last hospitalized by CT surgery form 8/17-24 for CABG x 5.  Postop also developed A-fib taking Eliquis.  Presented to the ED with dyspnea on exertion, CTA chest noted pulmonary embolism, bilateral pleural effusions -Status post left-sided thoracentesis with 1.6 L fluid removal on 8/28. -2D echo noted drop in EF down to 25%   Subjective: -Breathing improving, urinated a lot yesterday  Assessment and Plan:  Acute PE  Acute hypoxic respiratory failure -Second related to CHF, pleural effusions and PE  -agree that patient was on a very short course of Eliquis prior to hospitalization hence would not consider this an Eliquis failure -Now back on IV heparin for cath  Acute on chronic systolic CHF Bilateral pleural effusions -Recent CABG -Echo with EF down to 25-30%, down from around 40-45% on intra-op TEE 8/17 -Underwent thoracentesis 8/28, 1.6 L drained -Cardiology following, plan for cath tomorrow -Continue IV Lasix today, he diuresed 4 L yesterday -Continue beta-blocker, Jardiance, start Aldactone tomorrow after cath if kidney function is stable   Chronic atrial fibrillation -In A-fib, heart rate controlled, metoprolol dose increased, Cardizem discontinued with low EF -Continue IV heparin for now  CKD 3a -Stable, monitor with diuresis  Hypokalemia -Replaced   HTN -Stable   HLD -Continue atorvastatin   DVT prophylaxis: Heparin drip Code Status: Full code Family Communication: Discussed with patient and son at bedside Disposition Plan: Home likely 48 hours  Consultants: Cardiology   Procedures:   Antimicrobials:    Objective: Vitals:   05/22/22 0410 05/22/22 0616 05/22/22 0700 05/22/22 1116  BP: (!) 140/88  111/66 109/73  Pulse: 82  82 79  Resp: 19  17 17    Temp: 98.2 F (36.8 C)  98.2 F (36.8 C) 98.2 F (36.8 C)  TempSrc: Oral  Oral Oral  SpO2: 97%  97% 98%  Weight:  97.8 kg    Height:        Intake/Output Summary (Last 24 hours) at 05/22/2022 1403 Last data filed at 05/22/2022 1243 Gross per 24 hour  Intake 9.7 ml  Output 3805 ml  Net -3795.3 ml   Filed Weights   05/18/22 2120 05/21/22 0532 05/22/22 0616  Weight: 103.7 kg 101.6 kg 97.8 kg    Examination:  General exam: Elderly male sitting up in bed, AAOx3, no distress HEENT: No JVD CVS: S1-S2, regular rhythm Lungs: Few basilar rales Abdomen: Soft, nontender, bowel sounds present Extremities: 1+ edema  Skin: No rashes Psychiatry:  Mood & affect appropriate.     Data Reviewed:   CBC: Recent Labs  Lab 05/18/22 1430 05/19/22 0223 05/20/22 0256 05/21/22 0313  WBC 13.4* 12.3* 10.4 10.3  NEUTROABS 11.1*  --   --   --   HGB 11.5* 11.2* 11.1* 11.1*  HCT 36.0* 35.8* 34.4* 35.8*  MCV 95.0 95.0 92.0 95.7  PLT 326 297 266 [id number]   Basic Metabolic Panel: Recent Labs  Lab 05/18/22 1430 05/19/22 0223 05/20/22 0256 05/21/22 0313 05/22/22 0603  NA 136 138 136 138 140  K 4.6 4.0 3.3* 3.8 3.9  CL 103 103 105 105 103  CO2 23 25 22 23 26   GLUCOSE 141* 147* 161* 156* 116*  BUN 21 22 26* 25* 26*  CREATININE 1.33* 1.47* 1.43* 1.38* 1.57*  CALCIUM 8.4* 8.5* 8.2* 8.5* 8.8*  MG  --   --  2.0 2.0 2.0   GFR: Estimated Creatinine Clearance: 45.5 mL/min (A) (by C-G formula based on SCr of 1.57 mg/dL (H)). Liver Function Tests: Recent Labs  Lab 05/18/22 1430  AST 19  ALT 33  ALKPHOS 67  BILITOT 1.1  PROT 6.0*  ALBUMIN 3.2*   No results for input(s): "LIPASE", "AMYLASE" in the last 168 hours. No results for input(s): "AMMONIA" in the last 168 hours. Coagulation Profile: Recent Labs  Lab 05/18/22 1837  INR 1.8*   Cardiac Enzymes: No results for input(s): "CKTOTAL", "CKMB", "CKMBINDEX", "TROPONINI" in the last 168 hours. BNP (last 3 results) No results for  input(s): "PROBNP" in the last 8760 hours. HbA1C: No results for input(s): "HGBA1C" in the last 72 hours. CBG: Recent Labs  Lab 05/21/22 1120 05/21/22 1803 05/21/22 2115 05/22/22 0612 05/22/22 1123  GLUCAP 140* 124* 118* 111* 133*   Lipid Profile: No results for input(s): "CHOL", "HDL", "LDLCALC", "TRIG", "CHOLHDL", "LDLDIRECT" in the last 72 hours. Thyroid Function Tests: No results for input(s): "TSH", "T4TOTAL", "FREET4", "T3FREE", "THYROIDAB" in the last 72 hours. Anemia Panel: No results for input(s): "VITAMINB12", "FOLATE", "FERRITIN", "TIBC", "IRON", "RETICCTPCT" in the last 72 hours. Urine analysis:    Component Value Date/Time   COLORURINE AMBER (A) 05/18/2022 1430   APPEARANCEUR HAZY (A) 05/18/2022 1430   APPEARANCEUR Cloudy 09/25/2013 2024   LABSPEC 1.020 05/18/2022 1430   LABSPEC 1.015 09/25/2013 2024   PHURINE 5.0 05/18/2022 1430   GLUCOSEU NEGATIVE 05/18/2022 1430   GLUCOSEU Negative 09/25/2013 2024   HGBUR SMALL (A) 05/18/2022 1430   BILIRUBINUR NEGATIVE 05/18/2022 1430   BILIRUBINUR Negative 09/25/2013 2024   KETONESUR NEGATIVE 05/18/2022 1430   PROTEINUR 100 (A) 05/18/2022 1430   NITRITE NEGATIVE 05/18/2022 1430   LEUKOCYTESUR SMALL (A) 05/18/2022 1430   LEUKOCYTESUR 3+ 09/25/2013 2024   Sepsis Labs: @LABRCNTIP (procalcitonin:4,lacticidven:4)  ) Recent Results (from the past 240 hour(s))  Resp Panel by RT-PCR (Flu A&B, Covid) Anterior Nasal Swab     Status: None   Collection Time: 05/18/22  2:57 PM   Specimen: Anterior Nasal Swab  Result Value Ref Range Status   SARS Coronavirus 2 by RT PCR NEGATIVE NEGATIVE Final    Comment: (NOTE) SARS-CoV-2 target nucleic acids are NOT DETECTED.  The SARS-CoV-2 RNA is generally detectable in upper respiratory specimens during the acute phase of infection. The lowest concentration of SARS-CoV-2 viral copies this assay can detect is 138 copies/mL. A negative result does not preclude SARS-Cov-2 infection and  should not be used as the sole basis for treatment or other patient management decisions. A negative result may occur with  improper specimen collection/handling, submission of specimen other than nasopharyngeal swab, presence of viral mutation(s) within the areas targeted by this assay, and inadequate number of viral copies(<138 copies/mL). A negative result must be combined with clinical observations, patient history, and epidemiological information. The expected result is Negative.  Fact Sheet for Patients:  05/20/22  Fact Sheet for Healthcare Providers:  BloggerCourse.com  This test is no t yet approved or cleared by the [place] FDA and  has been authorized for detection and/or diagnosis of SARS-CoV-2 by FDA under an Emergency Use Authorization (EUA). This EUA will remain  in effect (meaning this test can be used) for the duration of the COVID-19 declaration under Section 564(b)(1) of the Act, 21 U.S.C.section 360bbb-3(b)(1), unless the authorization is terminated  or revoked sooner.       Influenza A by PCR NEGATIVE NEGATIVE Final   Influenza B by PCR  NEGATIVE NEGATIVE Final    Comment: (NOTE) The Xpert Xpress SARS-CoV-2/FLU/RSV plus assay is intended as an aid in the diagnosis of influenza from Nasopharyngeal swab specimens and should not be used as a sole basis for treatment. Nasal washings and aspirates are unacceptable for Xpert Xpress SARS-CoV-2/FLU/RSV testing.  Fact Sheet for Patients: BloggerCourse.com  Fact Sheet for Healthcare Providers: SeriousBroker.it  This test is not yet approved or cleared by the [place] FDA and has been authorized for detection and/or diagnosis of SARS-CoV-2 by FDA under an Emergency Use Authorization (EUA). This EUA will remain in effect (meaning this test can be used) for the duration of the COVID-19 declaration  under Section 564(b)(1) of the Act, 21 U.S.C. section 360bbb-3(b)(1), unless the authorization is terminated or revoked.  Performed at [facility], [address] [place], [place] [address]      Radiology Studies: DG Chest 2 View  Result Date: 05/22/2022 CLINICAL DATA:  [id number]; status post thoracentesis EXAM: CHEST - 2 VIEW COMPARISON:  Study from yesterday FINDINGS: Again seen are sternotomy wires and moderate cardiomegaly. There has been interval left thoracentesis. No pneumothorax. There is some atelectasis at the left lung base. Right lung remains clear. IMPRESSION: There has been interval thoracentesis. No pneumothorax. Cardiomegaly. Left basilar atelectasis. Electronically Signed   By: Marjo Bicker M.D.   On: 05/22/2022 07:59   DG Chest 1 View  Result Date: 05/21/2022 CLINICAL DATA:  Shortness of breath. EXAM: CHEST  1 VIEW COMPARISON:  05/20/2022. FINDINGS: Heart is enlarged the mediastinal contour stable. Sternotomy wires are present over the midline. There are small bilateral pleural effusions with atelectasis at the lung bases. No pneumothorax. No acute osseous abnormality. IMPRESSION: 1. Small bilateral pleural effusions with atelectasis at the lung bases, unchanged. 2. Cardiomegaly. Electronically Signed   By: Thornell Sartorius M.D.   On: 05/21/2022 00:46     Scheduled Meds:  aspirin EC  81 mg Oral Daily   atorvastatin  40 mg Oral Daily   docusate sodium  100 mg Oral BID   empagliflozin  10 mg Oral Daily   feeding supplement  237 mL Oral BID BM   furosemide  40 mg Intravenous BID   insulin aspart  0-15 Units Subcutaneous TID WC   metoprolol succinate  75 mg Oral QHS   multivitamin with minerals  1 tablet Oral Daily   sodium chloride flush  3 mL Intravenous Q12H   sodium chloride flush  3 mL Intravenous Q12H   Continuous Infusions:  heparin 1,500 Units/hr (05/22/22 1341)     LOS: 3 days    Time spent:  Zannie Cove, MD [facility]   05/22/2022, 2:03 PM

## 2022-05-22 NOTE — H&P (View-Only) (Signed)
Rounding Note    Patient Name: [name] Goodman Date of Encounter: 05/22/2022  [facility] Cardiologist: Orbie Pyo, MD   Subjective   Walking the hallway. Breathing continues to improve  Inpatient Medications    Scheduled Meds:  aspirin EC  81 mg Oral Daily   atorvastatin  40 mg Oral Daily   docusate sodium  100 mg Oral BID   empagliflozin  10 mg Oral Daily   feeding supplement  237 mL Oral BID BM   furosemide  40 mg Intravenous BID   insulin aspart  0-15 Units Subcutaneous TID WC   losartan  25 mg Oral QHS   metoprolol succinate  75 mg Oral QHS   multivitamin with minerals  1 tablet Oral Daily   sodium chloride flush  3 mL Intravenous Q12H   sodium chloride flush  3 mL Intravenous Q12H   Continuous Infusions:  heparin 1,600 Units/hr (05/21/22 2203)   PRN Meds: bisacodyl, hydrALAZINE, ipratropium-albuterol, ondansetron **OR** ondansetron (ZOFRAN) IV, polyethylene glycol, traMADol, traZODone   Vital Signs    Vitals:   05/22/22 0027 05/22/22 0410 05/22/22 0616 05/22/22 0700  BP: 134/79 (!) 140/88  111/66  Pulse: 82 82  82  Resp: 17 19  17   Temp: 98.2 F (36.8 C) 98.2 F (36.8 C)  98.2 F (36.8 C)  TempSrc: Oral Oral  Oral  SpO2: 96% 97%  97%  Weight:   97.8 kg   Height:        Intake/Output Summary (Last 24 hours) at 05/22/2022 0852 Last data filed at 05/22/2022 0600 Gross per 24 hour  Intake 449.7 ml  Output 3355 ml  Net -2905.3 ml      05/22/2022    6:16 AM 05/21/2022    5:32 AM 05/18/2022    9:20 PM  Last 3 Weights  Weight (lbs) 215 lb 9.8 oz 224 lb 228 lb 9.9 oz  Weight (kg) 97.8 kg 101.606 kg 103.7 kg      Telemetry    Sinus Rhythm, freq PVCs, short 3 beat runs of NSVT - Personally Reviewed  ECG    No new tracing  Physical Exam   GEN: No acute distress.   Neck: No JVD Cardiac: RRR, no murmurs, rubs, or gallops.  Respiratory: Clear to auscultation bilaterally. GI: Soft, nontender, non-distended  MS: 2+ pitting  bilateral LE edema; No deformity. Neuro:  Nonfocal  Psych: Normal affect   Labs    High Sensitivity Troponin:   Recent Labs  Lab 05/18/22 1430 05/18/22 1722  TROPONINIHS 277* 255*     Chemistry Recent Labs  Lab 05/18/22 1430 05/19/22 0223 05/20/22 0256 05/21/22 0313 05/22/22 0603  NA 136   < > 136 138 140  K 4.6   < > 3.3* 3.8 3.9  CL 103   < > 105 105 103  CO2 23   < > 22 23 26   GLUCOSE 141*   < > 161* 156* 116*  BUN 21   < > 26* 25* 26*  CREATININE 1.33*   < > 1.43* 1.38* 1.57*  CALCIUM 8.4*   < > 8.2* 8.5* 8.8*  MG  --   --  2.0 2.0 2.0  PROT 6.0*  --   --   --   --   ALBUMIN 3.2*  --   --   --   --   AST 19  --   --   --   --   ALT 33  --   --   --   --  ALKPHOS 67  --   --   --   --   BILITOT 1.1  --   --   --   --   GFRNONAA 54*   < > 49* 51* 44*  ANIONGAP 10   < > 9 10 11    < > = values in this interval not displayed.    Lipids No results for input(s): "CHOL", "TRIG", "HDL", "LABVLDL", "LDLCALC", "CHOLHDL" in the last 168 hours.  Hematology Recent Labs  Lab 05/19/22 0223 05/20/22 0256 05/21/22 0313  WBC 12.3* 10.4 10.3  RBC 3.77* 3.74* 3.74*  HGB 11.2* 11.1* 11.1*  HCT 35.8* 34.4* 35.8*  MCV 95.0 92.0 95.7  MCH 29.7 29.7 29.7  MCHC 31.3 32.3 31.0  RDW 14.9 15.0 14.9  PLT 297 266 321   Thyroid No results for input(s): "TSH", "FREET4" in the last 168 hours.  BNP Recent Labs  Lab 05/18/22 1430 05/21/22 0313  BNP 961.0* 576.9*    DDimer No results for input(s): "DDIMER" in the last 168 hours.   Radiology    DG Chest 2 View  Result Date: 05/22/2022 CLINICAL DATA:  [id number]; status post thoracentesis EXAM: CHEST - 2 VIEW COMPARISON:  Study from yesterday FINDINGS: Again seen are sternotomy wires and moderate cardiomegaly. There has been interval left thoracentesis. No pneumothorax. There is some atelectasis at the left lung base. Right lung remains clear. IMPRESSION: There has been interval thoracentesis. No pneumothorax. Cardiomegaly. Left  basilar atelectasis. Electronically Signed   By: Frazier Richards M.D.   On: 05/22/2022 07:59   DG Chest 1 View  Result Date: 05/21/2022 CLINICAL DATA:  Shortness of breath. EXAM: CHEST  1 VIEW COMPARISON:  05/20/2022. FINDINGS: Heart is enlarged the mediastinal contour stable. Sternotomy wires are present over the midline. There are small bilateral pleural effusions with atelectasis at the lung bases. No pneumothorax. No acute osseous abnormality. IMPRESSION: 1. Small bilateral pleural effusions with atelectasis at the lung bases, unchanged. 2. Cardiomegaly. Electronically Signed   By: Brett Fairy M.D.   On: 05/21/2022 00:46    Cardiac Studies   Echo: 05/19/22  IMPRESSIONS     1. Left ventricular ejection fraction, by estimation, is 25 to 30%. The  left ventricle has severely decreased function.   2. Right ventricular systolic function is mildly reduced. The right  ventricular size is normal.   3. The mitral valve is grossly normal.   Comparison(s): Compared to TEE on 05/08/22, the LVEF appears to have  dropped to 25-30% from 40-45%.   FINDINGS   Left Ventricle: Left ventricular ejection fraction, by estimation, is 25  to 30%. The left ventricle has severely decreased function. The left  ventricular internal cavity size was normal in size. There is no left  ventricular hypertrophy. Abnormal  (paradoxical) septal motion consistent with post-operative status.   Right Ventricle: The right ventricular size is normal. Right ventricular  systolic function is mildly reduced.   Mitral Valve: The mitral valve is grossly normal.   Tricuspid Valve: The tricuspid valve is normal in structure. Tricuspid  valve regurgitation is trivial.   Patient Profile     81 y.o. male with a hx of CABG on 05/08/2022, HTN, HLD, atrial fibrillation who is being seen 05/21/2022 for the evaluation of CHF at the request of Dr. Broadus John.  Assessment & Plan    Acute on Chronic HFrEF  Bilateral Pleural Effusions   Ischemic Cardiomyopathy  -- Intraoperative TEE on 8/17 during CABG showed EF 40-45%. Echo this admission with  EF 25-30%, abnormal septal motion consistent with post-operative status, mildly reduced RV systolic function  -- CTA chest on presentation showed mild pulmonary edema, moderate right and large left pleural effusions, segmental and subsegmental pulmonary embolus of the right middle lobe and right upper lobe  -- s/p left thoracentesis on 8/27, yielded 1.6 L fluid  -- IV lasix 40mg  BID, net - 3.3L thus far  - Started on jardiance 10 mg daily -- GDMT: continue jardiance, Toprol XL, losartan (ideally transition to entresto +/- spiro if bp able to tolerate this admission and Cr stable)    CAD  s/p 5v CABG 05/08/2022 -- Patient underwent CABG x5 on 05/08/2022 with LIMA-LAD, SVG-posterior descending, SVG-first diagonal, sequential left radial artery graft to obtuse marginal and posterolateral -- Continue ASA, statin, metoprolol  -- given decline in EF, concern for early graft failure, planned for cardiac cath tomorrow   Paroxsymal Atrial Fibrillation  -- Cardizem prior to admission-- stopped with newly reduced EF -- continue Toprol XL 75mg  daily  -- Initially transitioned to eliquis post thoracentesis, now held with plan for cardiac cath -- continue IV heparin   HTN  -- home HCTZ and diltiazem has been stopped  -- continue Toprol XL 75 mg daily, losartan 25mg  daily (hold losartan tomorrow with plans for cardiac cath)  CKD stage III -- Scr with slight increase 1.43>>1.38>>1.57 -- follow BMET (holding losartan in am with plans for cardiac cath)  Otherwise per primary  -- Acute PE -- Acute hypoxic respiratory failure   For questions or updates, please contact [facility] Please consult [email or web address] for contact info under        Signed, 05/10/2022, NP  05/22/2022, 8:52 AM     ATTENDING ATTESTATION:  After conducting a review of all available clinical  information with the care team, interviewing the patient, and performing a physical exam, I agree with the findings and plan described in this note.   GEN: No acute distress.   HEENT:  MMM, no JVD, no scleral icterus Cardiac: RRR, no murmurs, rubs, or gallops.  Respiratory: Clear to auscultation bilaterally. GI: Soft, nontender, non-distended  MS: +2 edema; No deformity. Neuro:  Nonfocal  Vasc:  +2 radial pulses  The patient is doing well after initiation of goal-directed medical therapy.  He diuresed quite briskly overnight.  His breathing is much improved.  He still demonstrates peripheral edema however.  He is able to lay flat in preparation for the cardiac catheterization tomorrow.  His creatinine is mildly elevated and so we will hold his losartan.  Otherwise we will continue Toprol, Jardiance, and heparin coverage for paroxysmal atrial fibrillation.  If graft failure is present we will need to think about percutaneous revascularization of his native coronary arteries.  I spoke with the patient and his son about our plans and the risks and benefits of coronary angiography and possible percutaneous coronary intervention as well as right heart catheterization tomorrow.  I have reviewed the risks, indications, and alternatives to cardiac catheterization, possible angioplasty, and stenting with the patient. Risks include but are not limited to bleeding, infection, vascular injury, stroke, myocardial infection, arrhythmia, kidney injury, radiation-related injury in the case of prolonged fluoroscopy use, emergency cardiac surgery, and death. The patient understands the risks of serious complication is 1-2 in 1000 with diagnostic cardiac cath and 1-2% or less with angioplasty/stenting.    , MD Pager [phone]

## 2022-05-22 NOTE — Progress Notes (Signed)
Heart Failure Stewardship Pharmacist Progress Note   PCP: [name], MD PCP-Cardiologist: Orbie Pyo, MD    HPI:  81 yo M with PMH of HTN, HLD, afib, and CAD s/p CABG on 8/17.  He was discharged on 8/23 but presented back to the ED on 8/27 with shortness of breath, fatigue, hypoxia, LE edema, and wheezing. CXR with small L pleural effusion. CTA with acute PE. Received thoracentesis on 8/28 removing 1.6L of fluid. Pre-CABG, his EF was 35-45%. An ECHO on 8/28 has shown drop in LVEF to 20-25% with mildly reduced RV. Cardiology has been consulted. Concern for graft failure, pending R/LHC 9/1.   Current HF Medications: Diuretic: furosemide 40 mg IV BID Beta Blocker: metoprolol succinate 75 mg daily ACE/ARB/ARNI: losartan 25 mg daily (hold) SGLT2i: Jardiance 10 mg daily **also on diltiazem PO  Prior to admission HF Medications: Beta blocker: metoprolol tartrate 25 mg BID ACE/ARB/ARNI: fosinopril 20 mg daily (has not restarted yet Other: Imdur 30 mg daily ** also on diltiazem PO and HCTZ (has not restarted yet)  Pertinent Lab Values: Serum creatinine 1.57, BUN 26, Potassium 3.9, Sodium 140, BNP 961.0, Magnesium 2.0, A1c 6.3   Vital Signs: Weight: 215 lbs (admission weight: 228 lbs) Blood pressure: 110-130/80s  Heart rate: 80s  I/O: -3.4L yesterday  Medication Assistance / Insurance Benefits Check: Does the patient have prescription insurance?  Yes Type of insurance plan: [employer]  Outpatient Pharmacy:  Prior to admission outpatient pharmacy: [facility] Is the patient willing to use [facility] pharmacy at discharge? Yes Is the patient willing to transition their outpatient pharmacy to utilize a [facility] outpatient pharmacy?   Pending    Assessment: 1. Acute on chronic systolic CHF (LVEF 20-25%), due to ICM s/p CABG on 8/17. NYHA class II symptoms. - Continue furosemide 40 mg IV BID. Good output yesterday. Strict I/Os. Daily weights. Keep K>4 and  Mag>2. - Continue metoprolol succinate 75 mg daily - Consider starting Entresto 24/26 mg BID after cath pending renal function (holding dose of losartan with plans for cath tomorrow) - Consider starting spironolactone prior to discharge to optimize GDMT - Continue Jardiance 10 mg daily - Discontinued diltiazem, do not resume on discharge - Consider stopping PTA HCTZ on discharge. Patient has not restarted this medication yet post-CABG.    Plan: 1) Medication changes recommended at this time: - None pending cath tomorrow, start Entresto post-cath if renal function allows - Do not resume diltiazem on discharge - Stop PTA HCTZ on discharge  2) Patient assistance: - Entresto copay $40 - Marcelline Deist copay $40 - Jardiance copay $40  3)  Education  - Patient has been educated on current HF medications and potential additions to HF medication regimen - Patient verbalizes understanding that over the next few months, these medication doses may change and more medications may be added to optimize HF regimen - Patient has been educated on basic disease state pathophysiology and goals of therapy   Sharen Hones, PharmD, BCPS Heart Failure Stewardship Pharmacist Phone [phone]

## 2022-05-22 NOTE — Progress Notes (Addendum)
Rounding Note    Patient Name: [name] Goodman Date of Encounter: 05/22/2022  [facility] Cardiologist: Orbie Pyo, MD   Subjective   Walking the hallway. Breathing continues to improve  Inpatient Medications    Scheduled Meds:  aspirin EC  81 mg Oral Daily   atorvastatin  40 mg Oral Daily   docusate sodium  100 mg Oral BID   empagliflozin  10 mg Oral Daily   feeding supplement  237 mL Oral BID BM   furosemide  40 mg Intravenous BID   insulin aspart  0-15 Units Subcutaneous TID WC   losartan  25 mg Oral QHS   metoprolol succinate  75 mg Oral QHS   multivitamin with minerals  1 tablet Oral Daily   sodium chloride flush  3 mL Intravenous Q12H   sodium chloride flush  3 mL Intravenous Q12H   Continuous Infusions:  heparin 1,600 Units/hr (05/21/22 2203)   PRN Meds: bisacodyl, hydrALAZINE, ipratropium-albuterol, ondansetron **OR** ondansetron (ZOFRAN) IV, polyethylene glycol, traMADol, traZODone   Vital Signs    Vitals:   05/22/22 0027 05/22/22 0410 05/22/22 0616 05/22/22 0700  BP: 134/79 (!) 140/88  111/66  Pulse: 82 82  82  Resp: 17 19  17   Temp: 98.2 F (36.8 C) 98.2 F (36.8 C)  98.2 F (36.8 C)  TempSrc: Oral Oral  Oral  SpO2: 96% 97%  97%  Weight:   97.8 kg   Height:        Intake/Output Summary (Last 24 hours) at 05/22/2022 0852 Last data filed at 05/22/2022 0600 Gross per 24 hour  Intake 449.7 ml  Output 3355 ml  Net -2905.3 ml      05/22/2022    6:16 AM 05/21/2022    5:32 AM 05/18/2022    9:20 PM  Last 3 Weights  Weight (lbs) 215 lb 9.8 oz 224 lb 228 lb 9.9 oz  Weight (kg) 97.8 kg 101.606 kg 103.7 kg      Telemetry    Sinus Rhythm, freq PVCs, short 3 beat runs of NSVT - Personally Reviewed  ECG    No new tracing  Physical Exam   GEN: No acute distress.   Neck: No JVD Cardiac: RRR, no murmurs, rubs, or gallops.  Respiratory: Clear to auscultation bilaterally. GI: Soft, nontender, non-distended  MS: 2+ pitting  bilateral LE edema; No deformity. Neuro:  Nonfocal  Psych: Normal affect   Labs    High Sensitivity Troponin:   Recent Labs  Lab 05/18/22 1430 05/18/22 1722  TROPONINIHS 277* 255*     Chemistry Recent Labs  Lab 05/18/22 1430 05/19/22 0223 05/20/22 0256 05/21/22 0313 05/22/22 0603  NA 136   < > 136 138 140  K 4.6   < > 3.3* 3.8 3.9  CL 103   < > 105 105 103  CO2 23   < > 22 23 26   GLUCOSE 141*   < > 161* 156* 116*  BUN 21   < > 26* 25* 26*  CREATININE 1.33*   < > 1.43* 1.38* 1.57*  CALCIUM 8.4*   < > 8.2* 8.5* 8.8*  MG  --   --  2.0 2.0 2.0  PROT 6.0*  --   --   --   --   ALBUMIN 3.2*  --   --   --   --   AST 19  --   --   --   --   ALT 33  --   --   --   --  ALKPHOS 67  --   --   --   --   BILITOT 1.1  --   --   --   --   GFRNONAA 54*   < > 49* 51* 44*  ANIONGAP 10   < > 9 10 11    < > = values in this interval not displayed.    Lipids No results for input(s): "CHOL", "TRIG", "HDL", "LABVLDL", "LDLCALC", "CHOLHDL" in the last 168 hours.  Hematology Recent Labs  Lab 05/19/22 0223 05/20/22 0256 05/21/22 0313  WBC 12.3* 10.4 10.3  RBC 3.77* 3.74* 3.74*  HGB 11.2* 11.1* 11.1*  HCT 35.8* 34.4* 35.8*  MCV 95.0 92.0 95.7  MCH 29.7 29.7 29.7  MCHC 31.3 32.3 31.0  RDW 14.9 15.0 14.9  PLT 297 266 321   Thyroid No results for input(s): "TSH", "FREET4" in the last 168 hours.  BNP Recent Labs  Lab 05/18/22 1430 05/21/22 0313  BNP 961.0* 576.9*    DDimer No results for input(s): "DDIMER" in the last 168 hours.   Radiology    DG Chest 2 View  Result Date: 05/22/2022 CLINICAL DATA:  [id number]; status post thoracentesis EXAM: CHEST - 2 VIEW COMPARISON:  Study from yesterday FINDINGS: Again seen are sternotomy wires and moderate cardiomegaly. There has been interval left thoracentesis. No pneumothorax. There is some atelectasis at the left lung base. Right lung remains clear. IMPRESSION: There has been interval thoracentesis. No pneumothorax. Cardiomegaly. Left  basilar atelectasis. Electronically Signed   By: Frazier Richards M.D.   On: 05/22/2022 07:59   DG Chest 1 View  Result Date: 05/21/2022 CLINICAL DATA:  Shortness of breath. EXAM: CHEST  1 VIEW COMPARISON:  05/20/2022. FINDINGS: Heart is enlarged the mediastinal contour stable. Sternotomy wires are present over the midline. There are small bilateral pleural effusions with atelectasis at the lung bases. No pneumothorax. No acute osseous abnormality. IMPRESSION: 1. Small bilateral pleural effusions with atelectasis at the lung bases, unchanged. 2. Cardiomegaly. Electronically Signed   By: Brett Fairy M.D.   On: 05/21/2022 00:46    Cardiac Studies   Echo: 05/19/22  IMPRESSIONS     1. Left ventricular ejection fraction, by estimation, is 25 to 30%. The  left ventricle has severely decreased function.   2. Right ventricular systolic function is mildly reduced. The right  ventricular size is normal.   3. The mitral valve is grossly normal.   Comparison(s): Compared to TEE on 05/08/22, the LVEF appears to have  dropped to 25-30% from 40-45%.   FINDINGS   Left Ventricle: Left ventricular ejection fraction, by estimation, is 25  to 30%. The left ventricle has severely decreased function. The left  ventricular internal cavity size was normal in size. There is no left  ventricular hypertrophy. Abnormal  (paradoxical) septal motion consistent with post-operative status.   Right Ventricle: The right ventricular size is normal. Right ventricular  systolic function is mildly reduced.   Mitral Valve: The mitral valve is grossly normal.   Tricuspid Valve: The tricuspid valve is normal in structure. Tricuspid  valve regurgitation is trivial.   Patient Profile     81 y.o. male with a hx of CABG on 05/08/2022, HTN, HLD, atrial fibrillation who is being seen 05/21/2022 for the evaluation of CHF at the request of Dr. Broadus John.  Assessment & Plan    Acute on Chronic HFrEF  Bilateral Pleural Effusions   Ischemic Cardiomyopathy  -- Intraoperative TEE on 8/17 during CABG showed EF 40-45%. Echo this admission with  EF 25-30%, abnormal septal motion consistent with post-operative status, mildly reduced RV systolic function  -- CTA chest on presentation showed mild pulmonary edema, moderate right and large left pleural effusions, segmental and subsegmental pulmonary embolus of the right middle lobe and right upper lobe  -- s/p left thoracentesis on 8/27, yielded 1.6 L fluid  -- IV lasix 40mg  BID, net - 3.3L thus far  - Started on jardiance 10 mg daily -- GDMT: continue jardiance, Toprol XL, losartan (ideally transition to entresto +/- spiro if bp able to tolerate this admission and Cr stable)    CAD  s/p 5v CABG 05/08/2022 -- Patient underwent CABG x5 on 05/08/2022 with LIMA-LAD, SVG-posterior descending, SVG-first diagonal, sequential left radial artery graft to obtuse marginal and posterolateral -- Continue ASA, statin, metoprolol  -- given decline in EF, concern for early graft failure, planned for cardiac cath tomorrow   Paroxsymal Atrial Fibrillation  -- Cardizem prior to admission-- stopped with newly reduced EF -- continue Toprol XL 75mg  daily  -- Initially transitioned to eliquis post thoracentesis, now held with plan for cardiac cath -- continue IV heparin   HTN  -- home HCTZ and diltiazem has been stopped  -- continue Toprol XL 75 mg daily, losartan 25mg  daily (hold losartan tomorrow with plans for cardiac cath)  CKD stage III -- Scr with slight increase 1.43>>1.38>>1.57 -- follow BMET (holding losartan in am with plans for cardiac cath)  Otherwise per primary  -- Acute PE -- Acute hypoxic respiratory failure   For questions or updates, please contact [facility] Please consult [email or web address] for contact info under        Signed, 05/10/2022, NP  05/22/2022, 8:52 AM     ATTENDING ATTESTATION:  After conducting a review of all available clinical  information with the care team, interviewing the patient, and performing a physical exam, I agree with the findings and plan described in this note.   GEN: No acute distress.   HEENT:  MMM, no JVD, no scleral icterus Cardiac: RRR, no murmurs, rubs, or gallops.  Respiratory: Clear to auscultation bilaterally. GI: Soft, nontender, non-distended  MS: +2 edema; No deformity. Neuro:  Nonfocal  Vasc:  +2 radial pulses  The patient is doing well after initiation of goal-directed medical therapy.  He diuresed quite briskly overnight.  His breathing is much improved.  He still demonstrates peripheral edema however.  He is able to lay flat in preparation for the cardiac catheterization tomorrow.  His creatinine is mildly elevated and so we will hold his losartan.  Otherwise we will continue Toprol, Jardiance, and heparin coverage for paroxysmal atrial fibrillation.  If graft failure is present we will need to think about percutaneous revascularization of his native coronary arteries.  I spoke with the patient and his son about our plans and the risks and benefits of coronary angiography and possible percutaneous coronary intervention as well as right heart catheterization tomorrow.  I have reviewed the risks, indications, and alternatives to cardiac catheterization, possible angioplasty, and stenting with the patient. Risks include but are not limited to bleeding, infection, vascular injury, stroke, myocardial infection, arrhythmia, kidney injury, radiation-related injury in the case of prolonged fluoroscopy use, emergency cardiac surgery, and death. The patient understands the risks of serious complication is 1-2 in 1000 with diagnostic cardiac cath and 1-2% or less with angioplasty/stenting.    , MD Pager [phone]

## 2022-05-22 NOTE — Progress Notes (Signed)
Critical Lab value called.  PTT 109.  Dr. Mitchel Honour informed.

## 2022-05-22 NOTE — Progress Notes (Signed)
ANTICOAGULATION CONSULT NOTE  Pharmacy Consult for Heparin Indication: New PE (CABG 04/2022 discharged on Eliquis for Afib)  No Known Allergies  Patient Measurements: Height: 6\' 1"  (185.4 cm) Weight: 97.8 kg (215 lb 9.8 oz) IBW/kg (Calculated) : 79.9 Heparin Dosing Weight: 96.2 kg  Vital Signs: Temp: 97.9 F (36.6 C) (08/31 1525) Temp Source: Oral (08/31 1525) BP: 109/71 (08/31 1525) Pulse Rate: 82 (08/31 1525)  Labs: Recent Labs    05/20/22 0256 05/21/22 0313 05/22/22 0603 05/22/22 1651  HGB 11.1* 11.1*  --   --   HCT 34.4* 35.8*  --   --   PLT 266 321  --   --   APTT 70*  --  109* 62*  HEPARINUNFRC >1.10*  --  >1.10*  --   CREATININE 1.43* 1.38* 1.57*  --      Estimated Creatinine Clearance: 45.5 mL/min (A) (by C-G formula based on SCr of 1.57 mg/dL (H)).   Medical History: Past Medical History:  Diagnosis Date   Arthritis    History of kidney stones    Hyperlipidemia    Hypertension    Nocturia    S/P CABG (coronary artery bypass graft)     Medications:  Medications Prior to Admission  Medication Sig Dispense Refill Last Dose   apixaban (ELIQUIS) 5 MG TABS tablet Take 1 tablet (5 mg total) by mouth 2 (two) times daily. 60 tablet 3 05/18/2022 at 7:00am   aspirin EC 81 MG tablet Take 81 mg by mouth daily.   05/18/2022   atorvastatin (LIPITOR) 40 MG tablet Take 1 tablet (40 mg total) by mouth daily. 30 tablet 3 05/18/2022   cyanocobalamin (VITAMIN B12) 1000 MCG tablet Take 1,000 mcg by mouth daily.   05/18/2022   diltiazem (TIAZAC) 240 MG 24 hr capsule Take 240 mg by mouth daily.   05/18/2022   [START ON 06/15/2022] fosinopril (MONOPRIL) 20 MG tablet Take 1 tablet (20 mg total) by mouth daily.   05/18/2022   [START ON 06/15/2022] hydrochlorothiazide (HYDRODIURIL) 25 MG tablet Take 1 tablet (25 mg total) by mouth daily.   05/18/2022   isosorbide mononitrate (IMDUR) 30 MG 24 hr tablet Take 1 tablet (30 mg total) by mouth daily. 30 tablet 0 05/18/2022   metoprolol  tartrate (LOPRESSOR) 25 MG tablet Take 1 tablet (25 mg total) by mouth 2 (two) times daily. 60 tablet 3 05/18/2022 at 7:00am   traMADol (ULTRAM) 50 MG tablet Take 1 tablet (50 mg total) by mouth every 4 (four) hours as needed for moderate pain. 30 tablet 0 Past Week    Assessment: 81 yo M presented with SOB, dry cough, fatigue, peripheral edema and hypoxia. Patient found to have new PE. Pt is s/p CABG on 8/17. Discharged on eliquis 5mg  BID for AF. Last dose 8/27 0700 PTA.   Plan for cath 9/1 so patient was changed from apixaban to heparin. Last dose of apixaban on 8/30@0935 . Hgb 11.1, plt 321.   aPTT slightly low (62s) on heparin at 1500 unit/hr  Goal of Therapy:  Heparin level 0.3-0.7 units/ml aPTT 66-102 seconds Monitor platelets by anticoagulation protocol: Yes   Plan:  Increase heparin to 1550 units/hr  Order aPTT 6 hr after rate change 9/1 @0200  Monitor for signs/symptoms of bleeding. Plans for cardiac cath 9/1 at 1030  11/1, PharmD Clinical Pharmacist 05/22/2022 7:22 PM   **Pharmacist phone directory can be found on [email or web address] listed under [facility].

## 2022-05-22 NOTE — Progress Notes (Signed)
ANTICOAGULATION CONSULT NOTE  Pharmacy Consult for Heparin Indication: New PE (CABG 04/2022 discharged on Eliquis for Afib)  No Known Allergies  Patient Measurements: Height: 6\' 1"  (185.4 cm) Weight: 97.8 kg (215 lb 9.8 oz) IBW/kg (Calculated) : 79.9 Heparin Dosing Weight: 96.2 kg  Vital Signs: Temp: 98.2 F (36.8 C) (08/31 0700) Temp Source: Oral (08/31 0700) BP: 111/66 (08/31 0700) Pulse Rate: 82 (08/31 0700)  Labs: Recent Labs    05/19/22 1014 05/20/22 0256 05/21/22 0313 05/22/22 0603  HGB  --  11.1* 11.1*  --   HCT  --  34.4* 35.8*  --   PLT  --  266 321  --   APTT 69* 70*  --  109*  HEPARINUNFRC  --  >1.10*  --  >1.10*  CREATININE  --  1.43* 1.38* 1.57*     Estimated Creatinine Clearance: 45.5 mL/min (A) (by C-G formula based on SCr of 1.57 mg/dL (H)).   Medical History: Past Medical History:  Diagnosis Date   Arthritis    History of kidney stones    Hyperlipidemia    Hypertension    Nocturia    S/P CABG (coronary artery bypass graft)     Medications:  Medications Prior to Admission  Medication Sig Dispense Refill Last Dose   apixaban (ELIQUIS) 5 MG TABS tablet Take 1 tablet (5 mg total) by mouth 2 (two) times daily. 60 tablet 3 05/18/2022 at 7:00am   aspirin EC 81 MG tablet Take 81 mg by mouth daily.   05/18/2022   atorvastatin (LIPITOR) 40 MG tablet Take 1 tablet (40 mg total) by mouth daily. 30 tablet 3 05/18/2022   cyanocobalamin (VITAMIN B12) 1000 MCG tablet Take 1,000 mcg by mouth daily.   05/18/2022   diltiazem (TIAZAC) 240 MG 24 hr capsule Take 240 mg by mouth daily.   05/18/2022   [START ON 06/15/2022] fosinopril (MONOPRIL) 20 MG tablet Take 1 tablet (20 mg total) by mouth daily.   05/18/2022   [START ON 06/15/2022] hydrochlorothiazide (HYDRODIURIL) 25 MG tablet Take 1 tablet (25 mg total) by mouth daily.   05/18/2022   isosorbide mononitrate (IMDUR) 30 MG 24 hr tablet Take 1 tablet (30 mg total) by mouth daily. 30 tablet 0 05/18/2022   metoprolol  tartrate (LOPRESSOR) 25 MG tablet Take 1 tablet (25 mg total) by mouth 2 (two) times daily. 60 tablet 3 05/18/2022 at 7:00am   traMADol (ULTRAM) 50 MG tablet Take 1 tablet (50 mg total) by mouth every 4 (four) hours as needed for moderate pain. 30 tablet 0 Past Week    Assessment: 81 yo M presented with SOB, dry cough, fatigue, peripheral edema and hypoxia. Patient found to have new PE.  Pt is s/p CABG on 8/17. Discharged on eliquis 5mg  BID for AF. Last dose 8/27 0700 PTA.   Plan for cath 9/1 so patient was changed from apixaban to heparin. Last dose of apixaban on 8/30@0935 . Hgb 11.1, plt 321.   aPTT slightly elevated at 109 on heparin at 1600 unit/hr.  No bleeding noted.  Goal of Therapy:  Heparin level 0.3-0.7 units/ml aPTT 66-102 seconds Monitor platelets by anticoagulation protocol: Yes   Plan:  Reduce heparin to 1500 units/hr  Order aPTT 6 hr after rate change Monitor for signs/symptoms of bleeding. Plans for cardiac cath 9/1 at [address] Pharm.D., BCPS Clinical Pharmacist Clinical phone for 05/22/2022 from 7:30-3:00 is [phone].  **Pharmacist phone directory can be found on [email or web address] listed under [facility].  05/22/2022 8:53  AM

## 2022-05-22 NOTE — Progress Notes (Signed)
Mobility Specialist - Progress Note   05/22/22 1529  Mobility  Activity Ambulated with assistance to bathroom  Level of Assistance Standby assist, set-up cues, supervision of patient - no hands on  Assistive Device None  Distance Ambulated (ft) 10 ft  Activity Response Tolerated well  $Mobility charge 1 Mobility   Pt was received in chair and requesting to go to the BR. No c/o pain throughout tranfer. Pt was left in BR seated and told to call nurse when finished.  Garald Braver

## 2022-05-22 NOTE — H&P (Addendum)
CHF post CABG ECHO EF preop 40%; Cath EF 35%; Post op Echo EF 25% For left and right heart cath with bypass angio and possible PCI.

## 2022-05-23 ENCOUNTER — Encounter (HOSPITAL_COMMUNITY): Payer: Self-pay | Admitting: Interventional Cardiology

## 2022-05-23 ENCOUNTER — Encounter (HOSPITAL_COMMUNITY)
Admission: EM | Disposition: A | Discharge status: Home Health | Payer: Self-pay | Source: Home / Self Care | Attending: Internal Medicine

## 2022-05-23 DIAGNOSIS — I2510 Atherosclerotic heart disease of native coronary artery without angina pectoris: Secondary | ICD-10-CM | POA: Diagnosis not present

## 2022-05-23 DIAGNOSIS — I5022 Chronic systolic (congestive) heart failure: Secondary | ICD-10-CM | POA: Diagnosis not present

## 2022-05-23 DIAGNOSIS — I5021 Acute systolic (congestive) heart failure: Secondary | ICD-10-CM | POA: Diagnosis not present

## 2022-05-23 HISTORY — PX: RIGHT/LEFT HEART CATH AND CORONARY/GRAFT ANGIOGRAPHY: CATH118267

## 2022-05-23 LAB — POCT I-STAT EG7
Acid-Base Excess: 5 mmol/L — ABNORMAL HIGH (ref 0.0–2.0)
Acid-Base Excess: 5 mmol/L — ABNORMAL HIGH (ref 0.0–2.0)
Bicarbonate: 29.2 mmol/L — ABNORMAL HIGH (ref 20.0–28.0)
Bicarbonate: 29.5 mmol/L — ABNORMAL HIGH (ref 20.0–28.0)
Calcium, Ion: 1.15 mmol/L (ref 1.15–1.40)
Calcium, Ion: 1.15 mmol/L (ref 1.15–1.40)
HCT: 36 % — ABNORMAL LOW (ref 39.0–52.0)
HCT: 37 % — ABNORMAL LOW (ref 39.0–52.0)
Hemoglobin: 12.2 g/dL — ABNORMAL LOW (ref 13.0–17.0)
Hemoglobin: 12.6 g/dL — ABNORMAL LOW (ref 13.0–17.0)
O2 Saturation: 64 %
O2 Saturation: 67 %
Potassium: 3.6 mmol/L (ref 3.5–5.1)
Potassium: 3.7 mmol/L (ref 3.5–5.1)
Sodium: 140 mmol/L (ref 135–145)
Sodium: 140 mmol/L (ref 135–145)
TCO2: 30 mmol/L (ref 22–32)
TCO2: 31 mmol/L (ref 22–32)
pCO2, Ven: 42.2 mmHg — ABNORMAL LOW (ref 44–60)
pCO2, Ven: 42.4 mmHg — ABNORMAL LOW (ref 44–60)
pH, Ven: 7.446 — ABNORMAL HIGH (ref 7.25–7.43)
pH, Ven: 7.453 — ABNORMAL HIGH (ref 7.25–7.43)
pO2, Ven: 32 mmHg (ref 32–45)
pO2, Ven: 33 mmHg (ref 32–45)

## 2022-05-23 LAB — BASIC METABOLIC PANEL
Anion gap: 11 (ref 5–15)
BUN: 26 mg/dL — ABNORMAL HIGH (ref 8–23)
CO2: 27 mmol/L (ref 22–32)
Calcium: 8.8 mg/dL — ABNORMAL LOW (ref 8.9–10.3)
Chloride: 101 mmol/L (ref 98–111)
Creatinine, Ser: 1.58 mg/dL — ABNORMAL HIGH (ref 0.61–1.24)
GFR, Estimated: 44 mL/min — ABNORMAL LOW (ref >60)
Glucose, Bld: 114 mg/dL — ABNORMAL HIGH (ref 70–99)
Potassium: 4.1 mmol/L (ref 3.5–5.1)
Sodium: 139 mmol/L (ref 135–145)

## 2022-05-23 LAB — HEPARIN LEVEL (UNFRACTIONATED): Heparin Unfractionated: >1.1 IU/mL — ABNORMAL HIGH (ref 0.30–0.70)

## 2022-05-23 LAB — GLUCOSE, CAPILLARY
Glucose-Capillary: 111 mg/dL — ABNORMAL HIGH (ref 70–99)
Glucose-Capillary: 106 mg/dL — ABNORMAL HIGH (ref 70–99)
Glucose-Capillary: 121 mg/dL — ABNORMAL HIGH (ref 70–99)
Glucose-Capillary: 145 mg/dL — ABNORMAL HIGH (ref 70–99)

## 2022-05-23 LAB — POCT I-STAT 7, (LYTES, BLD GAS, ICA,H+H)
Acid-Base Excess: 4 mmol/L — ABNORMAL HIGH (ref 0.0–2.0)
Bicarbonate: 28.2 mmol/L — ABNORMAL HIGH (ref 20.0–28.0)
Calcium, Ion: 1.18 mmol/L (ref 1.15–1.40)
HCT: 35 % — ABNORMAL LOW (ref 39.0–52.0)
Hemoglobin: 11.9 g/dL — ABNORMAL LOW (ref 13.0–17.0)
O2 Saturation: 96 %
Potassium: 3.5 mmol/L (ref 3.5–5.1)
Sodium: 136 mmol/L (ref 135–145)
TCO2: 30 mmol/L (ref 22–32)
pCO2 arterial: 42.5 mmHg (ref 32–48)
pH, Arterial: 7.431 (ref 7.35–7.45)
pO2, Arterial: 78 mmHg — ABNORMAL LOW (ref 83–108)

## 2022-05-23 LAB — POCT ACTIVATED CLOTTING TIME: Activated Clotting Time: 149 seconds

## 2022-05-23 LAB — APTT: aPTT: 87 seconds — ABNORMAL HIGH (ref 24–36)

## 2022-05-23 LAB — MAGNESIUM: Magnesium: 2.1 mg/dL (ref 1.7–2.4)

## 2022-05-23 SURGERY — RIGHT/LEFT HEART CATH AND CORONARY/GRAFT ANGIOGRAPHY
Anesthesia: LOCAL

## 2022-05-23 MED ORDER — SODIUM CHLORIDE 0.9 % IV SOLN: 250.0000 mL | INTRAVENOUS | Status: DC | PRN

## 2022-05-23 MED ORDER — FENTANYL CITRATE (PF) 100 MCG/2ML IJ SOLN
INTRAMUSCULAR | Status: DC | PRN
  Administered 2022-05-23: 25 ug via INTRAVENOUS

## 2022-05-23 MED ORDER — ONDANSETRON HCL 4 MG/2ML IJ SOLN: 4.0000 mg | Freq: Four times a day (QID) | INTRAMUSCULAR | Status: DC | PRN

## 2022-05-23 MED ORDER — METOPROLOL SUCCINATE ER 100 MG PO TB24
100.0000 mg | ORAL_TABLET | Freq: Every day | ORAL | Status: DC
  Administered 2022-05-23 – 2022-05-27 (×5): 100 mg via ORAL
  Filled 2022-05-23 (×5): qty 1

## 2022-05-23 MED ORDER — SODIUM CHLORIDE 0.9 % IV SOLN: INTRAVENOUS | Status: AC

## 2022-05-23 MED ORDER — FUROSEMIDE 20 MG PO TABS
20.0000 mg | ORAL_TABLET | Freq: Two times a day (BID) | ORAL | Status: DC
  Administered 2022-05-24 – 2022-05-28 (×8): 20 mg via ORAL
  Filled 2022-05-23 (×8): qty 1

## 2022-05-23 MED ORDER — HEPARIN (PORCINE) IN NACL 1000-0.9 UT/500ML-% IV SOLN
INTRAVENOUS | Status: DC | PRN
  Administered 2022-05-23 (×2): 500 mL

## 2022-05-23 MED ORDER — SODIUM CHLORIDE 0.9% FLUSH
3.0000 mL | Freq: Two times a day (BID) | INTRAVENOUS | Status: DC
  Administered 2022-05-24 – 2022-05-27 (×6): 3 mL via INTRAVENOUS

## 2022-05-23 MED ORDER — FENTANYL CITRATE (PF) 100 MCG/2ML IJ SOLN
INTRAMUSCULAR | Status: AC
  Filled 2022-05-23: qty 2

## 2022-05-23 MED ORDER — HEPARIN (PORCINE) IN NACL 1000-0.9 UT/500ML-% IV SOLN
INTRAVENOUS | Status: AC
  Filled 2022-05-23: qty 1000

## 2022-05-23 MED ORDER — ASPIRIN 81 MG PO CHEW
81.0000 mg | CHEWABLE_TABLET | Freq: Every day | ORAL | Status: DC
  Administered 2022-05-24 – 2022-05-26 (×3): 81 mg via ORAL
  Filled 2022-05-23 (×3): qty 1

## 2022-05-23 MED ORDER — METOPROLOL SUCCINATE ER 25 MG PO TB24: 12.5000 mg | ORAL_TABLET | Freq: Every day | ORAL | Status: DC

## 2022-05-23 MED ORDER — HYDRALAZINE HCL 20 MG/ML IJ SOLN
10.0000 mg | INTRAMUSCULAR | Status: AC | PRN
Start: 2022-05-23 — End: 2022-05-23

## 2022-05-23 MED ORDER — LABETALOL HCL 5 MG/ML IV SOLN: 10.0000 mg | INTRAVENOUS | Status: AC | PRN

## 2022-05-23 MED ORDER — LIDOCAINE HCL (PF) 1 % IJ SOLN
INTRAMUSCULAR | Status: DC | PRN
  Administered 2022-05-23: 20 mL

## 2022-05-23 MED ORDER — HEPARIN SODIUM (PORCINE) 5000 UNIT/ML IJ SOLN: 5000.0000 [IU] | Freq: Three times a day (TID) | INTRAMUSCULAR | Status: DC

## 2022-05-23 MED ORDER — IOHEXOL 350 MG/ML SOLN
INTRAVENOUS | Status: DC | PRN
  Administered 2022-05-23: 110 mL

## 2022-05-23 MED ORDER — ATORVASTATIN CALCIUM 80 MG PO TABS
80.0000 mg | ORAL_TABLET | Freq: Every day | ORAL | Status: DC
  Administered 2022-05-24 – 2022-05-28 (×5): 80 mg via ORAL
  Filled 2022-05-23 (×5): qty 1

## 2022-05-23 MED ORDER — HEPARIN (PORCINE) 25000 UT/250ML-% IV SOLN
1550.0000 [IU]/h | INTRAVENOUS | Status: DC
  Administered 2022-05-23: 1500 [IU]/h via INTRAVENOUS
  Administered 2022-05-24: 1600 [IU]/h via INTRAVENOUS
  Administered 2022-05-26 – 2022-05-27 (×2): 1550 [IU]/h via INTRAVENOUS
  Filled 2022-05-23 (×6): qty 250

## 2022-05-23 MED ORDER — ASPIRIN 81 MG PO CHEW
81.0000 mg | CHEWABLE_TABLET | Freq: Once | ORAL | Status: AC
  Administered 2022-05-23: 81 mg via ORAL
  Filled 2022-05-23: qty 1

## 2022-05-23 MED ORDER — MIDAZOLAM HCL 2 MG/2ML IJ SOLN
INTRAMUSCULAR | Status: DC | PRN
  Administered 2022-05-23: 1 mg via INTRAVENOUS

## 2022-05-23 MED ORDER — ACETAMINOPHEN 325 MG PO TABS: 650.0000 mg | ORAL_TABLET | ORAL | Status: DC | PRN

## 2022-05-23 MED ORDER — SODIUM CHLORIDE 0.9% FLUSH: 3.0000 mL | INTRAVENOUS | Status: DC | PRN

## 2022-05-23 MED ORDER — METOPROLOL SUCCINATE ER 25 MG PO TB24
12.5000 mg | ORAL_TABLET | Freq: Once | ORAL | Status: AC
  Administered 2022-05-23: 12.5 mg via ORAL
  Filled 2022-05-23: qty 1

## 2022-05-23 MED ORDER — MIDAZOLAM HCL 2 MG/2ML IJ SOLN
INTRAMUSCULAR | Status: AC
  Filled 2022-05-23: qty 2

## 2022-05-23 MED ORDER — LIDOCAINE HCL (PF) 1 % IJ SOLN
INTRAMUSCULAR | Status: AC
  Filled 2022-05-23: qty 30

## 2022-05-23 SURGICAL SUPPLY — 14 items
CATH 5FR JL3.5 JR4 ANG PIG MP (CATHETERS) IMPLANT
CATH INFINITI 5 FR IM (CATHETERS) IMPLANT
CATH INFINITI 5FR MPB2 (CATHETERS) IMPLANT
CATH SWAN GANZ 7F STRAIGHT (CATHETERS) IMPLANT
ELECT DEFIB PAD ADLT CADENCE (PAD) IMPLANT
KIT HEART LEFT (KITS) ×1 IMPLANT
MAT PREVALON FULL STRYKER (MISCELLANEOUS) IMPLANT
PACK CARDIAC CATHETERIZATION (CUSTOM PROCEDURE TRAY) ×1 IMPLANT
SHEATH PINNACLE 5F 10CM (SHEATH) IMPLANT
SHEATH PINNACLE 7F 10CM (SHEATH) IMPLANT
SHEATH PROBE COVER 6X72 (BAG) IMPLANT
TRANSDUCER W/STOPCOCK (MISCELLANEOUS) ×1 IMPLANT
TUBING CIL FLEX 10 FLL-RA (TUBING) ×1 IMPLANT
WIRE EMERALD 3MM-J .035X150CM (WIRE) IMPLANT

## 2022-05-23 NOTE — Progress Notes (Signed)
ANTICOAGULATION CONSULT NOTE  Pharmacy Consult for Heparin Indication: New PE (CABG 04/2022 discharged on Eliquis for Afib)  No Known Allergies  Patient Measurements: Height: 6\' 1"  (185.4 cm) Weight: 97.8 kg (215 lb 9.8 oz) IBW/kg (Calculated) : 79.9 Heparin Dosing Weight: 96.2 kg  Vital Signs: Temp: 97.8 F (36.6 C) (09/01 1310) Temp Source: Oral (09/01 1310) BP: 125/92 (09/01 1400) Pulse Rate: 76 (09/01 1400)  Labs: Recent Labs    05/21/22 0313 05/22/22 0603 05/22/22 1651 05/23/22 0420  HGB 11.1*  --   --   --   HCT 35.8*  --   --   --   PLT 321  --   --   --   APTT  --  109* 62* 87*  HEPARINUNFRC  --  >1.10*  --  >1.10*  CREATININE 1.38* 1.57*  --  1.58*     Estimated Creatinine Clearance: 45.2 mL/min (A) (by C-G formula based on SCr of 1.58 mg/dL (H)).   Medical History: Past Medical History:  Diagnosis Date   Arthritis    History of kidney stones    Hyperlipidemia    Hypertension    Nocturia    S/P CABG (coronary artery bypass graft)     Medications:  Medications Prior to Admission  Medication Sig Dispense Refill Last Dose   apixaban (ELIQUIS) 5 MG TABS tablet Take 1 tablet (5 mg total) by mouth 2 (two) times daily. 60 tablet 3 05/18/2022 at 7:00am   aspirin EC 81 MG tablet Take 81 mg by mouth daily.   05/18/2022   atorvastatin (LIPITOR) 40 MG tablet Take 1 tablet (40 mg total) by mouth daily. 30 tablet 3 05/18/2022   cyanocobalamin (VITAMIN B12) 1000 MCG tablet Take 1,000 mcg by mouth daily.   05/18/2022   diltiazem (TIAZAC) 240 MG 24 hr capsule Take 240 mg by mouth daily.   05/18/2022   [START ON 06/15/2022] fosinopril (MONOPRIL) 20 MG tablet Take 1 tablet (20 mg total) by mouth daily.   05/18/2022   [START ON 06/15/2022] hydrochlorothiazide (HYDRODIURIL) 25 MG tablet Take 1 tablet (25 mg total) by mouth daily.   05/18/2022   isosorbide mononitrate (IMDUR) 30 MG 24 hr tablet Take 1 tablet (30 mg total) by mouth daily. 30 tablet 0 05/18/2022   metoprolol  tartrate (LOPRESSOR) 25 MG tablet Take 1 tablet (25 mg total) by mouth 2 (two) times daily. 60 tablet 3 05/18/2022 at 7:00am   traMADol (ULTRAM) 50 MG tablet Take 1 tablet (50 mg total) by mouth every 4 (four) hours as needed for moderate pain. 30 tablet 0 Past Week    Assessment: 81 yo M presented with SOB, dry cough, fatigue, peripheral edema and hypoxia. Patient found to have new PE. Pt is s/p CABG on 8/17. Discharged on eliquis 5mg  BID for AF. Last dose 8/27 0700 PTA.   Patient apixaban held on admit and started on heparin. Last dose of apixaban on 8/30@0935 . Hgb 11.1, plt 321.   Heparin drip held this am for cath lab> restart heparin drip tonight 6hr post femoral sheath removal  - ongoing procedures planned  - resume apixaban when complete  Goal of Therapy:  Heparin level 0.3-0.7 units/ml aPTT 66-102 seconds Monitor platelets by anticoagulation protocol: Yes   Plan:  Restart  heparin to 1500 units/hr  at 8pm Daily CBC and heparin level, aptt  Monitor for signs/symptoms of bleeding.   9/27 Pharm.D. CPP, BCPS Clinical Pharmacist [phone] 05/23/2022 2:29 PM     **Pharmacist phone directory  can be found on [email or web address] listed under [facility].

## 2022-05-23 NOTE — CV Procedure (Addendum)
Occluded SVG to RCA/PDA, and occluded SVG sequential to diagonal, ramus, and PL branch. LIMA is patent to mid LAD. Severe LV systolic dysfunction with EF 20 to 25%. Severe RCA disease and native LCA disease. There are targets for PCI. Probably better treated after on good HF therapy and kidney function okay. Normal hemodynamics and no evidence of pulmonary hypertension.  Medical therapy with guideline directed therapy for systolic heart failure Consider RCA PCI when patient is further out from surgery and is stronger and less ischemic symptoms develop.

## 2022-05-23 NOTE — Progress Notes (Signed)
Heart Failure Stewardship Pharmacist Progress Note   PCP: [name], MD PCP-Cardiologist: Orbie Pyo, MD    HPI:  81 yo M with PMH of HTN, HLD, afib, and CAD s/p CABG on 8/17.  He was discharged on 8/23 but presented back to the ED on 8/27 with shortness of breath, fatigue, hypoxia, LE edema, and wheezing. CXR with small L pleural effusion. CTA with acute PE. Received thoracentesis on 8/28 removing 1.6L of fluid. Pre-CABG, his EF was 35-45%. An ECHO on 8/28 has shown drop in LVEF to 20-25% with mildly reduced RV. Cardiology has been consulted. Concern for graft failure, going for [facility] 9/1.   Current HF Medications: Diuretic: furosemide 40 mg IV BID Beta Blocker: metoprolol succinate 75 mg daily ACE/ARB/ARNI: losartan 25 mg daily (hold) SGLT2i: Jardiance 10 mg daily **also on diltiazem PO  Prior to admission HF Medications: Beta blocker: metoprolol tartrate 25 mg BID ACE/ARB/ARNI: fosinopril 20 mg daily (has not restarted yet Other: Imdur 30 mg daily ** also on diltiazem PO and HCTZ (has not restarted yet)  Pertinent Lab Values: Serum creatinine 1.58, BUN 26, Potassium 4.1, Sodium 139, BNP 961.0, Magnesium 2.1, A1c 6.3   Vital Signs: Weight (as of 8/31): 215 lbs (admission weight: 228 lbs) Blood pressure: 120/80s  Heart rate: 70-80s  I/O: -4L yesterday  Medication Assistance / Insurance Benefits Check: Does the patient have prescription insurance?  Yes Type of insurance plan: [employer]  Outpatient Pharmacy:  Prior to admission outpatient pharmacy: [facility] Is the patient willing to use [facility] TOC pharmacy at discharge? Yes Is the patient willing to transition their outpatient pharmacy to utilize a [facility] outpatient pharmacy?   Pending    Assessment: 1. Acute on chronic systolic CHF (LVEF 20-25%), due to ICM s/p CABG on 8/17. NYHA class II symptoms. - Continue furosemide 40 mg IV BID pending RHC findings today. Excellent output yesterday.  Strict I/Os. Daily weights (no weight collected today). Keep K>4 and Mag>2. - Continue metoprolol succinate 75 mg daily - Consider starting Entresto 24/26 mg BID after cath (holding dose of losartan for cath) - Consider starting spironolactone prior to discharge to optimize GDMT - Continue Jardiance 10 mg daily - Discontinued diltiazem, do not resume on discharge - Consider stopping PTA HCTZ on discharge. Patient has not restarted this medication yet post-CABG.    Plan: 1) Medication changes recommended at this time: - Start Entresto 24/26 mg BID tonight - Do not resume diltiazem on discharge - Stop PTA HCTZ on discharge  2) Patient assistance: Sherryll Burger copay $40 Marcelline Deist copay $40 - Jardiance copay $40  3)  Education  - Patient has been educated on current HF medications and potential additions to HF medication regimen - Patient verbalizes understanding that over the next few months, these medication doses may change and more medications may be added to optimize HF regimen - Patient has been educated on basic disease state pathophysiology and goals of therapy   Sharen Hones, PharmD, BCPS Heart Failure Stewardship Pharmacist Phone [phone]

## 2022-05-23 NOTE — Progress Notes (Signed)
ANTICOAGULATION CONSULT NOTE - Follow Up Consult  Pharmacy Consult for heparin Indication: atrial fibrillation and pulmonary embolus  Labs: Recent Labs    05/21/22 0313 05/22/22 0603 05/22/22 1651 05/23/22 0420  HGB 11.1*  --   --   --   HCT 35.8*  --   --   --   PLT 321  --   --   --   APTT  --  109* 62* 87*  HEPARINUNFRC  --  >1.10*  --  >1.10*  CREATININE 1.38* 1.57*  --   --     Assessment/Plan:  81yo male therapeutic on heparin after rate change. Will continue infusion at current rate of 1550 units/hr and confirm stable with additional PTT.   Vernard Gambles, PharmD, BCPS  05/23/2022,6:00 AM

## 2022-05-23 NOTE — Interval H&P Note (Signed)
Cath Lab Visit (complete for each Cath Lab visit)  Clinical Evaluation Leading to the Procedure:   ACS: No.  Non-ACS:    Anginal Classification: CCS III  Anti-ischemic medical therapy: No Therapy  Non-Invasive Test Results: No non-invasive testing performed  Prior CABG: Previous CABG      History and Physical Interval Note:  05/23/2022 10:15 AM  [name] Goodman  has presented today for surgery, with the diagnosis of systolic heart failure.  The various methods of treatment have been discussed with the patient and family. After consideration of risks, benefits and other options for treatment, the patient has consented to  Procedure(s): RIGHT/LEFT HEART CATH AND CORONARY/GRAFT ANGIOGRAPHY (N/A) as a surgical intervention.  The patient's history has been reviewed, patient examined, no change in status, stable for surgery.  I have reviewed the patient's chart and labs.  Questions were answered to the patient's satisfaction.     Lyn Records III

## 2022-05-23 NOTE — Progress Notes (Signed)
Pt back from cath lab. Telemetry reapplied and CCMD notified. VS assessed and set up for frequent assessment.  R groin WNL, peripheral pulses detected.  Pt educated on BR until 1645 with HOB <30 degrees.  Family at bedside.  Pt appears very depressed about results from cath.  Per cath lab transport staff, Dr. Katrinka Blazing to see pt at bedside when available.

## 2022-05-23 NOTE — Progress Notes (Signed)
Site area- right  Site Prior to Removal- 0   Pressure Applied For-  25 MInutes   Bedrest Beginning at - 1245   Manual- Yes   Patient Status During Pull- Stable    Post Pull Groin Site- 0   Post Pull Instructions Given- Yes   Post Pull Pulses Present- Yes, slightly palpable, confirmed by doppler.    Dressing Applied- Tegaderm and Gauze Dressing    Comments:  Harland Dingwall, 2H RN pulled sheaths under my guidance. 5 Fr sheath arterial sheath removed first with hemostasis achieved. Following 10 minutes of hemostasis 7 fr sheath removed from R femoral vein. A total of 25 minutes of arterial pressure was held and 15 minutes of venous pressure.Distal DP pulse slightly palpable, doppler used to confirm. Patient stable throughout sheath pull sight level 0.

## 2022-05-23 NOTE — Care Management Important Message (Signed)
Important Message  Patient Details  Name: [name] Goodman MRN: [id number] Date of Birth: 04/07/1941   Medicare Important Message Given:  Yes     Renie Ora 05/23/2022, 10:59 AM

## 2022-05-23 NOTE — Progress Notes (Signed)
PROGRESS NOTE    [name] Goodman  YBO:[id number] DOB: 19-Jan-1941 DOA: 05/18/2022 PCP: [name], MD   81 y.o. male with medical history significant of HTN and HLD presenting with SOB.  He was last hospitalized by CT surgery form 8/17-24 for CABG x 5.  Postop also developed A-fib taking Eliquis.  Presented to the ED with dyspnea on exertion, CTA chest noted pulmonary embolism, bilateral pleural effusions -Status post left-sided thoracentesis with 1.6 L fluid removal on 8/28. -2D echo noted drop in EF down to 25%   Subjective: -Breathing improving, urinated a lot yesterday  Assessment and Plan:  Acute PE  Acute hypoxic respiratory failure -Second related to CHF, pleural effusions and PE  -agree that patient was on a very short course of Eliquis prior to hospitalization hence would not consider this an Eliquis failure -Now back on IV heparin for cath, switch back to Eliquis tomorrow  Acute on chronic systolic CHF Bilateral pleural effusions -Recent CABG 8/17 -Echo with EF down to 25-30%, down from around 40-45% on intra-op TEE 8/17 -Underwent thoracentesis 8/28, 1.6 L drained -Cardiology following, plan for cath today -Diuresed with IV Lasix he is 6.7 L negative, switch to oral diuretics tomorrow -Continue beta-blocker, Jardiance, start Aldactone tomorrow if kidney function is stable   Chronic atrial fibrillation -In A-fib, heart rate controlled, metoprolol dose increased, Cardizem discontinued with low EF -Continue IV heparin for now  CKD 3a -Stable, monitor with diuresis  Hypokalemia -Replaced   HTN -Stable   HLD -Continue atorvastatin   DVT prophylaxis: Heparin drip Code Status: Full code Family Communication: Discussed with patient and son at bedside Disposition Plan: Home likely 48 hours  Consultants: Cardiology, T CTS   Procedures: Thoracentesis  left heart cath today  Antimicrobials:    Objective: Vitals:   05/23/22 1240 05/23/22 1245  05/23/22 1250 05/23/22 1310  BP: 132/83 127/88 (!) 145/90 119/80  Pulse: 72 72 73 73  Resp: 15 18 19 17   Temp:    97.8 F (36.6 C)  TempSrc:    Oral  SpO2:  98% 98% 98%  Weight:      Height:        Intake/Output Summary (Last 24 hours) at 05/23/2022 1334 Last data filed at 05/23/2022 0708 Gross per 24 hour  Intake --  Output 2650 ml  Net -2650 ml   Filed Weights   05/18/22 2120 05/21/22 0532 05/22/22 0616  Weight: 103.7 kg 101.6 kg 97.8 kg    Examination:  General exam: Pleasant elderly male sitting up in bed, AAOx3, no distress HEENT: No JVD CVS: S1-S2, regular rhythm Lungs: Rare basilar rales otherwise clear Abdomen: Soft, nontender, bowel sounds present Extremities: Trace edema  Skin: No rashes Psychiatry:  Mood & affect appropriate.     Data Reviewed:   CBC: Recent Labs  Lab 05/18/22 1430 05/19/22 0223 05/20/22 0256 05/21/22 0313  WBC 13.4* 12.3* 10.4 10.3  NEUTROABS 11.1*  --   --   --   HGB 11.5* 11.2* 11.1* 11.1*  HCT 36.0* 35.8* 34.4* 35.8*  MCV 95.0 95.0 92.0 95.7  PLT 326 297 266 321   Basic Metabolic Panel: Recent Labs  Lab 05/19/22 0223 05/20/22 0256 05/21/22 0313 05/22/22 0603 05/23/22 0420  NA 138 136 138 140 139  K 4.0 3.3* 3.8 3.9 4.1  CL 103 105 105 103 101  CO2 25 22 23 26 27   GLUCOSE 147* 161* 156* 116* 114*  BUN 22 26* 25* 26* 26*  CREATININE 1.47* 1.43* 1.38*  1.57* 1.58*  CALCIUM 8.5* 8.2* 8.5* 8.8* 8.8*  MG  --  2.0 2.0 2.0 2.1   GFR: Estimated Creatinine Clearance: 45.2 mL/min (A) (by C-G formula based on SCr of 1.58 mg/dL (H)). Liver Function Tests: Recent Labs  Lab 05/18/22 1430  AST 19  ALT 33  ALKPHOS 67  BILITOT 1.1  PROT 6.0*  ALBUMIN 3.2*   No results for input(s): "LIPASE", "AMYLASE" in the last 168 hours. No results for input(s): "AMMONIA" in the last 168 hours. Coagulation Profile: Recent Labs  Lab 05/18/22 1837  INR 1.8*   Cardiac Enzymes: No results for input(s): "CKTOTAL", "CKMB", "CKMBINDEX",  "TROPONINI" in the last 168 hours. BNP (last 3 results) No results for input(s): "PROBNP" in the last 8760 hours. HbA1C: No results for input(s): "HGBA1C" in the last 72 hours. CBG: Recent Labs  Lab 05/22/22 1123 05/22/22 1558 05/22/22 2117 05/23/22 0626 05/23/22 1314  GLUCAP 133* 102* 163* 111* 106*   Lipid Profile: No results for input(s): "CHOL", "HDL", "LDLCALC", "TRIG", "CHOLHDL", "LDLDIRECT" in the last 72 hours. Thyroid Function Tests: No results for input(s): "TSH", "T4TOTAL", "FREET4", "T3FREE", "THYROIDAB" in the last 72 hours. Anemia Panel: No results for input(s): "VITAMINB12", "FOLATE", "FERRITIN", "TIBC", "IRON", "RETICCTPCT" in the last 72 hours. Urine analysis:    Component Value Date/Time   COLORURINE AMBER (A) 05/18/2022 1430   APPEARANCEUR HAZY (A) 05/18/2022 1430   APPEARANCEUR Cloudy 09/25/2013 2024   LABSPEC 1.020 05/18/2022 1430   LABSPEC 1.015 09/25/2013 2024   PHURINE 5.0 05/18/2022 1430   GLUCOSEU NEGATIVE 05/18/2022 1430   GLUCOSEU Negative 09/25/2013 2024   HGBUR SMALL (A) 05/18/2022 1430   BILIRUBINUR NEGATIVE 05/18/2022 1430   BILIRUBINUR Negative 09/25/2013 2024   KETONESUR NEGATIVE 05/18/2022 1430   PROTEINUR 100 (A) 05/18/2022 1430   NITRITE NEGATIVE 05/18/2022 1430   LEUKOCYTESUR SMALL (A) 05/18/2022 1430   LEUKOCYTESUR 3+ 09/25/2013 2024   Sepsis Labs: @LABRCNTIP (procalcitonin:4,lacticidven:4)  ) Recent Results (from the past 240 hour(s))  Resp Panel by RT-PCR (Flu A&B, Covid) Anterior Nasal Swab     Status: None   Collection Time: 05/18/22  2:57 PM   Specimen: Anterior Nasal Swab  Result Value Ref Range Status   SARS Coronavirus 2 by RT PCR NEGATIVE NEGATIVE Final    Comment: (NOTE) SARS-CoV-2 target nucleic acids are NOT DETECTED.  The SARS-CoV-2 RNA is generally detectable in upper respiratory specimens during the acute phase of infection. The lowest concentration of SARS-CoV-2 viral copies this assay can detect is 138  copies/mL. A negative result does not preclude SARS-Cov-2 infection and should not be used as the sole basis for treatment or other patient management decisions. A negative result may occur with  improper specimen collection/handling, submission of specimen other than nasopharyngeal swab, presence of viral mutation(s) within the areas targeted by this assay, and inadequate number of viral copies(<138 copies/mL). A negative result must be combined with clinical observations, patient history, and epidemiological information. The expected result is Negative.  Fact Sheet for Patients:  EntrepreneurPulse.com.au  Fact Sheet for Healthcare Providers:  IncredibleEmployment.be  This test is no t yet approved or cleared by the [place] FDA and  has been authorized for detection and/or diagnosis of SARS-CoV-2 by FDA under an Emergency Use Authorization (EUA). This EUA will remain  in effect (meaning this test can be used) for the duration of the COVID-19 declaration under Section 564(b)(1) of the Act, 21 U.S.C.section 360bbb-3(b)(1), unless the authorization is terminated  or revoked sooner.  Influenza A by PCR NEGATIVE NEGATIVE Final   Influenza B by PCR NEGATIVE NEGATIVE Final    Comment: (NOTE) The Xpert Xpress SARS-CoV-2/FLU/RSV plus assay is intended as an aid in the diagnosis of influenza from Nasopharyngeal swab specimens and should not be used as a sole basis for treatment. Nasal washings and aspirates are unacceptable for Xpert Xpress SARS-CoV-2/FLU/RSV testing.  Fact Sheet for Patients: BloggerCourse.com  Fact Sheet for Healthcare Providers: SeriousBroker.it  This test is not yet approved or cleared by the [place] FDA and has been authorized for detection and/or diagnosis of SARS-CoV-2 by FDA under an Emergency Use Authorization (EUA). This EUA will remain in effect (meaning  this test can be used) for the duration of the COVID-19 declaration under Section 564(b)(1) of the Act, 21 U.S.C. section 360bbb-3(b)(1), unless the authorization is terminated or revoked.  Performed at [facility], [address] [place], [place] [address]      Radiology Studies: CARDIAC CATHETERIZATION  Result Date: 05/23/2022 CONCLUSIONS: Occluded sequential bypass graft to diagonal, obtuse marginal, and PL branch. Occluded saphenous vein graft to the PDA. Patent LIMA to mid LAD. Patent left main with 50% distal disease.  Subtotal occlusion of the mid LAD with patent diagonals 1 through 3.. Patent large first obtuse marginal and small ramus intermedius.  The proximal to mid circumflex contains 50 to 70% stenosis. Native right coronary is patent with moderate diffuse disease proximal to distal.  Focal eccentric 60 to 70% stenosis before the origin of the PDA and left ventricular branches appearing somewhat improved compared to pre-CABG. Global hypokinesis with EF 25%.  LVEDP normal. Normal pulmonary artery pressures.  Mean pulmonary capillary wedge pressure 9 mmHg.  Normal right atrial pressure. RECOMMENDATIONS: Guideline directed therapy for systolic heart failure Consider percutaneous revascularization of distal RCA and circumflex obtuse marginal if angina or other clinical symptoms   DG Chest 2 View  Result Date: 05/22/2022 CLINICAL DATA:  [id number]; status post thoracentesis EXAM: CHEST - 2 VIEW COMPARISON:  Study from yesterday FINDINGS: Again seen are sternotomy wires and moderate cardiomegaly. There has been interval left thoracentesis. No pneumothorax. There is some atelectasis at the left lung base. Right lung remains clear. IMPRESSION: There has been interval thoracentesis. No pneumothorax. Cardiomegaly. Left basilar atelectasis. Electronically Signed   By: Marjo Bicker M.D.   On: 05/22/2022 07:59     Scheduled Meds:  [START ON 05/24/2022] aspirin  81 mg Oral Daily   atorvastatin   40 mg Oral Daily   docusate sodium  100 mg Oral BID   empagliflozin  10 mg Oral Daily   feeding supplement  237 mL Oral BID BM   furosemide  40 mg Intravenous BID   heparin  5,000 Units Subcutaneous Q8H   insulin aspart  0-15 Units Subcutaneous TID WC   metoprolol succinate  75 mg Oral QHS   multivitamin with minerals  1 tablet Oral Daily   sodium chloride flush  3 mL Intravenous Q12H   sodium chloride flush  3 mL Intravenous Q12H   sodium chloride flush  3 mL Intravenous Q12H   Continuous Infusions:  sodium chloride     sodium chloride     heparin 1,550 Units/hr (05/23/22 0451)     LOS: 4 days    Time spent:  Zannie Cove, MD [facility]   05/23/2022, 1:34 PM

## 2022-05-23 NOTE — Progress Notes (Addendum)
Rounding Note    Patient Name: [name] Goodman Date of Encounter: 05/23/2022  [facility] Cardiologist: Orbie Pyo, MD   Subjective   No chest pain, SOB is improved, can lie flat.  Did receive lasix this am  Inpatient Medications    Scheduled Meds:  aspirin EC  81 mg Oral Daily   atorvastatin  40 mg Oral Daily   docusate sodium  100 mg Oral BID   empagliflozin  10 mg Oral Daily   feeding supplement  237 mL Oral BID BM   furosemide  40 mg Intravenous BID   insulin aspart  0-15 Units Subcutaneous TID WC   metoprolol succinate  75 mg Oral QHS   multivitamin with minerals  1 tablet Oral Daily   sodium chloride flush  3 mL Intravenous Q12H   sodium chloride flush  3 mL Intravenous Q12H   Continuous Infusions:  sodium chloride     sodium chloride 10 mL/hr at 05/23/22 0654   heparin 1,550 Units/hr (05/23/22 0451)   PRN Meds: sodium chloride, bisacodyl, hydrALAZINE, ipratropium-albuterol, ondansetron **OR** ondansetron (ZOFRAN) IV, polyethylene glycol, sodium chloride flush, traMADol, traZODone   Vital Signs    Vitals:   05/22/22 2100 05/22/22 2340 05/23/22 0344 05/23/22 0817  BP: 132/68 127/73 118/72 118/79  Pulse: 87 76 79 83  Resp: 20 16 17 19   Temp: 97.8 F (36.6 C) 98.2 F (36.8 C) 98.2 F (36.8 C) 97.7 F (36.5 C)  TempSrc: Oral Oral Oral Oral  SpO2: 97% 97% 100% 96%  Weight:      Height:        Intake/Output Summary (Last 24 hours) at 05/23/2022 0902 Last data filed at 05/23/2022 0708 Gross per 24 hour  Intake --  Output 3350 ml  Net -3350 ml      05/22/2022    6:16 AM 05/21/2022    5:32 AM 05/18/2022    9:20 PM  Last 3 Weights  Weight (lbs) 215 lb 9.8 oz 224 lb 228 lb 9.9 oz  Weight (kg) 97.8 kg 101.606 kg 103.7 kg      Telemetry    SR with freq couplets and up to 3-4 beats - Personally Reviewed  ECG    No new - Personally Reviewed  Physical Exam   GEN: No acute distress.   Neck: No JVD Cardiac: RRR, no murmurs, rubs,  or gallops.  Respiratory: Clear to auscultation bilaterally.ant.  GI: Soft, nontender, non-distended  MS: 1-2+ edema; No deformity. Neuro:  Nonfocal  Psych: Normal affect   Labs    High Sensitivity Troponin:   Recent Labs  Lab 05/18/22 1430 05/18/22 1722  TROPONINIHS 277* 255*     Chemistry Recent Labs  Lab 05/18/22 1430 05/19/22 0223 05/21/22 0313 05/22/22 0603 05/23/22 0420  NA 136   < > 138 140 139  K 4.6   < > 3.8 3.9 4.1  CL 103   < > 105 103 101  CO2 23   < > 23 26 27   GLUCOSE 141*   < > 156* 116* 114*  BUN 21   < > 25* 26* 26*  CREATININE 1.33*   < > 1.38* 1.57* 1.58*  CALCIUM 8.4*   < > 8.5* 8.8* 8.8*  MG  --    < > 2.0 2.0 2.1  PROT 6.0*  --   --   --   --   ALBUMIN 3.2*  --   --   --   --   AST  19  --   --   --   --   ALT 33  --   --   --   --   ALKPHOS 67  --   --   --   --   BILITOT 1.1  --   --   --   --   GFRNONAA 54*   < > 51* 44* 44*  ANIONGAP 10   < > 10 11 11    < > = values in this interval not displayed.    Lipids No results for input(s): "CHOL", "TRIG", "HDL", "LABVLDL", "LDLCALC", "CHOLHDL" in the last 168 hours.  Hematology Recent Labs  Lab 05/19/22 0223 05/20/22 0256 05/21/22 0313  WBC 12.3* 10.4 10.3  RBC 3.77* 3.74* 3.74*  HGB 11.2* 11.1* 11.1*  HCT 35.8* 34.4* 35.8*  MCV 95.0 92.0 95.7  MCH 29.7 29.7 29.7  MCHC 31.3 32.3 31.0  RDW 14.9 15.0 14.9  PLT 297 266 321   Thyroid No results for input(s): "TSH", "FREET4" in the last 168 hours.  BNP Recent Labs  Lab 05/18/22 1430 05/21/22 0313  BNP 961.0* 576.9*    DDimer No results for input(s): "DDIMER" in the last 168 hours.   Radiology    DG Chest 2 View  Result Date: 05/22/2022 CLINICAL DATA:  [id number]; status post thoracentesis EXAM: CHEST - 2 VIEW COMPARISON:  Study from yesterday FINDINGS: Again seen are sternotomy wires and moderate cardiomegaly. There has been interval left thoracentesis. No pneumothorax. There is some atelectasis at the left lung base. Right lung  remains clear. IMPRESSION: There has been interval thoracentesis. No pneumothorax. Cardiomegaly. Left basilar atelectasis. Electronically Signed   By: Frazier Richards M.D.   On: 05/22/2022 07:59    Cardiac Studies     Echo: 05/19/22   IMPRESSIONS     1. Left ventricular ejection fraction, by estimation, is 25 to 30%. The  left ventricle has severely decreased function.   2. Right ventricular systolic function is mildly reduced. The right  ventricular size is normal.   3. The mitral valve is grossly normal.   Comparison(s): Compared to TEE on 05/08/22, the LVEF appears to have  dropped to 25-30% from 40-45%.   FINDINGS   Left Ventricle: Left ventricular ejection fraction, by estimation, is 25  to 30%. The left ventricle has severely decreased function. The left  ventricular internal cavity size was normal in size. There is no left  ventricular hypertrophy. Abnormal  (paradoxical) septal motion consistent with post-operative status.   Right Ventricle: The right ventricular size is normal. Right ventricular  systolic function is mildly reduced.   Mitral Valve: The mitral valve is grossly normal.   Tricuspid Valve: The tricuspid valve is normal in structure. Tricuspid  valve regurgitation is trivial.   Patient Profile     81 y.o. male hx of CABG on 05/08/2022, HTN, HLD, atrial fibrillation now followed for HFrEF.   Assessment & Plan    Acute on Chronic HFrEF  Bilateral Pleural Effusions  Ischemic Cardiomyopathy --new -- Intraoperative TEE on 8/17 during CABG showed EF 40-45%. Echo this admission with EF 25-30%, abnormal septal motion consistent with post-operative status, mildly reduced RV systolic function  -- CTA chest on presentation showed mild pulmonary edema, moderate right and large left pleural effusions, segmental and subsegmental pulmonary embolus of the right middle lobe and right upper lobe  -- s/p left thoracentesis on 8/27, yielded 1.6 L fluid  -- IV lasix 40mg   BID, net - 6.4L thus far  wt down from 103.7 kg to 97.8 Kg.  - Started on jardiance 10 mg daily -- GDMT: continue jardiance, Toprol XL, losartan (ideally transition to entresto +/- spiro if bp able to tolerate this admission and Cr stable)     CAD  s/p 5v CABG 05/08/2022 -- Patient underwent CABG x5 on 05/08/2022 with LIMA-LAD, SVG-posterior descending, SVG-first diagonal, sequential left radial artery graft to obtuse marginal and posterolateral -- Continue ASA, statin, metoprolol  -- given decline in EF, concern for early graft failure, planned for cardiac cath today  Freq PVCs couplets and 3-4 beats of NSVT  -K+ 4.1 Mg 2.1   -pt unaware of PVCs -On toprol XL 75 at hs will give 12.5 lopressor now   Paroxsymal Atrial Fibrillation maintaining SR -- Cardizem prior to admission-- stopped with newly reduced EF -- continue Toprol XL 75mg  daily  -- Initially transitioned to eliquis post thoracentesis, now held with plan for cardiac cath -- continue IV heparin   HTN  -- home HCTZ and diltiazem has been stopped  -- continue Toprol XL 75 mg daily, losartan 25mg  daily (hold losartan tomorrow with plans for cardiac cath)   CKD stage III -- Scr with slight increase 1.43>>1.38>>1.57>>1.58 -- follow BMET (holding losartan with plans for cardiac cath)   Otherwise per primary  -- Acute PE- resume heparin post cath  -- Acute hypoxic respiratory failure     For questions or updates, please contact [place] Please consult [email or web address] for contact info under        Signed, Cecilie Kicks, NP  05/23/2022, 9:02 AM     ATTENDING ATTESTATION:  After conducting a review of all available clinical information with the care team, interviewing the patient, and performing a physical exam, I agree with the findings and plan described in this note.   The patient is doing well.  He feels less short of breath.  Unfortunately his coronary angiography and bypass angiography study demonstrated that  his vein graft to diagonal and vein graft to PDA are occluded.  His LIMA to LAD remains patent and backfills up to the proximal portion.  He is a radial arterial conduit from the obtuse marginal to the R PLV branch.  There seems to be an anastomotic lesion of the R PLV branch.  He has high-grade distal right coronary artery disease as well as moderate proximal disease.  He has a moderate left main and moderate left circumflex lesions.  His right heart catheterization demonstrated relatively normal filling pressures.  GEN: No acute distress.   HEENT:  MMM, no JVD, no scleral icterus Cardiac: RRR, no murmurs, rubs, or gallops.  Respiratory: Clear to auscultation bilaterally. GI: Soft, nontender, non-distended  MS: No edema; No deformity. Neuro:  Nonfocal  Vasc:  +2 radial pulses; RFA site clean dry and intact  Plan: 1.  Acute on chronic systolic heart failure: We will continue to hold losartan in preparation for cardiac catheterization and PCI on Tuesday.  Increase Toprol to 100 mg.,  Continue Jardiance, will change to Lasix 20 mg p.o. twice daily given mildly elevated creatinine.  We will plan on reinstituting losartan and spironolactone following PCI. 2.  Ischemic cardiomyopathy: I reviewed the films with Dr. Tamala Julian.  We will proceed to [facility] assessment of the left main and left circumflex with possible stenting.  If this is positive then PCI will be performed to augment perfusion of the left circumflex territory along with planned PCI of the native right coronary artery.  The native diagonal  is a small vessel and intervention will be deferred.  I discussed my rationale with the patient and he is agreeable to this approach.  We will plan on coronary angiography on Tuesday with Dr. Katrinka Blazing. 3.  Hypertension: Increasing beta-blocker as detailed above for improved afterload reduction 4.  Hyperlipidemia: Increase atorvastatin to 80 mg given elevated troponin. 5.  Chronic kidney disease stage III: Creatinine  is mildly elevated.  Holding losartan and holding on starting spironolactone for now.  I have reviewed the risks, indications, and alternatives to cardiac catheterization, possible angioplasty, and stenting with the patient. Risks include but are not limited to bleeding, infection, vascular injury, stroke, myocardial infection, arrhythmia, kidney injury, radiation-related injury in the case of prolonged fluoroscopy use, emergency cardiac surgery, and death. The patient understands the risks of serious complication is 1-2 in 1000 with diagnostic cardiac cath and 1-2% or less with angioplasty/stenting.    Alverda Skeans, MD Pager [phone]

## 2022-05-24 ENCOUNTER — Inpatient Hospital Stay (HOSPITAL_COMMUNITY): Payer: PPO

## 2022-05-24 DIAGNOSIS — I5022 Chronic systolic (congestive) heart failure: Secondary | ICD-10-CM | POA: Diagnosis not present

## 2022-05-24 DIAGNOSIS — I5021 Acute systolic (congestive) heart failure: Secondary | ICD-10-CM | POA: Diagnosis not present

## 2022-05-24 LAB — BASIC METABOLIC PANEL
Anion gap: 12 (ref 5–15)
BUN: 25 mg/dL — ABNORMAL HIGH (ref 8–23)
CO2: 25 mmol/L (ref 22–32)
Calcium: 9.2 mg/dL (ref 8.9–10.3)
Chloride: 103 mmol/L (ref 98–111)
Creatinine, Ser: 1.41 mg/dL — ABNORMAL HIGH (ref 0.61–1.24)
GFR, Estimated: 50 mL/min — ABNORMAL LOW (ref >60)
Glucose, Bld: 118 mg/dL — ABNORMAL HIGH (ref 70–99)
Potassium: 3.6 mmol/L (ref 3.5–5.1)
Sodium: 140 mmol/L (ref 135–145)

## 2022-05-24 LAB — GLUCOSE, CAPILLARY
Glucose-Capillary: 115 mg/dL — ABNORMAL HIGH (ref 70–99)
Glucose-Capillary: 109 mg/dL — ABNORMAL HIGH (ref 70–99)
Glucose-Capillary: 120 mg/dL — ABNORMAL HIGH (ref 70–99)
Glucose-Capillary: 116 mg/dL — ABNORMAL HIGH (ref 70–99)

## 2022-05-24 LAB — CBC
HCT: 38.6 % — ABNORMAL LOW (ref 39.0–52.0)
Hemoglobin: 12.3 g/dL — ABNORMAL LOW (ref 13.0–17.0)
MCH: 29.5 pg (ref 26.0–34.0)
MCHC: 31.9 g/dL (ref 30.0–36.0)
MCV: 92.6 fL (ref 80.0–100.0)
Platelets: 306 10*3/uL (ref 150–400)
RBC: 4.17 MIL/uL — ABNORMAL LOW (ref 4.22–5.81)
RDW: 14.8 % (ref 11.5–15.5)
WBC: 7.1 10*3/uL (ref 4.0–10.5)
nRBC: 0 % (ref 0.0–0.2)

## 2022-05-24 LAB — MAGNESIUM: Magnesium: 2.2 mg/dL (ref 1.7–2.4)

## 2022-05-24 LAB — APTT
aPTT: 56 seconds — ABNORMAL HIGH (ref 24–36)
aPTT: 65 seconds — ABNORMAL HIGH (ref 24–36)

## 2022-05-24 LAB — HEPARIN LEVEL (UNFRACTIONATED): Heparin Unfractionated: >1.1 IU/mL — ABNORMAL HIGH (ref 0.30–0.70)

## 2022-05-24 NOTE — Progress Notes (Signed)
PROGRESS NOTE    [name] Goodman  JOA:[id number] DOB: 21-Jan-1941 DOA: 05/18/2022 PCP: [name], MD   81 y.o. male with medical history significant of HTN and HLD presenting with SOB.  He was last hospitalized by CT surgery form 8/17-24 for CABG x 5.  Postop also developed A-fib taking Eliquis.  Presented to the ED with dyspnea on exertion, CTA chest noted pulmonary embolism, bilateral pleural effusions -Status post left-sided thoracentesis with 1.6 L fluid removal on 8/28. -2D echo noted drop in EF down to 25% -Left heart cath noted occluded SVG to RCA/PDA and occluded SVG to diagonal, ramus and PL branch, LIMA to male mid LAD is patent, severe RCA disease and native LCA disease, severe LV dysfunction with EF of 20-25%   Subjective: -Feels okay overall,  Assessment and Plan:  Acute PE  Acute hypoxic respiratory failure -Second related to CHF, pleural effusions and PE  -agree that patient was on a very short course of Eliquis prior to hospitalization hence would not consider this an Eliquis failure -Remains on IV heparin, plan for PCI to RCA and circumflex on Tuesday  Acute on chronic systolic CHF Bilateral pleural effusions -Recent CABG 8/17 -Echo with EF down to 25-30%, down from around 40-45% on intra-op TEE 8/17 -Underwent thoracentesis 8/28, 1.6 L drained -Cardiology following, event cardiac cath yesterday, prior 5 vessel CABG on 8/17, LIMA to LAD is patent, all other grafts have failed, plan for PCI to RCA and circumflex on Tuesday -Diuresed with IV Lasix he is 7.9 L negative, switch to oral diuretics tomorrow -Continue beta-blocker, Jardiance   Chronic atrial fibrillation -In A-fib, heart rate controlled, metoprolol dose increased, Cardizem discontinued with low EF -Continue IV heparin for now  CKD 3a -Stable, monitor with diuresis  Hypokalemia -Replaced   HTN -Stable   HLD -Continue atorvastatin   DVT prophylaxis: Heparin drip Code Status: Full  code Family Communication: Discussed with patient and friend at bedside Disposition Plan: Home likely Wednesday if stable  Consultants: Cardiology, T CTS   Procedures: Thoracentesis  left heart cath: Occluded SVG to RCA/PDA, and occluded SVG sequential to diagonal, ramus, and PL branch. LIMA is patent to mid LAD. Severe LV systolic dysfunction with EF 20 to 25%. Severe RCA disease and native LCA disease. There are targets for PCI. Probably better treated after on good HF therapy and kidney function okay. Normal hemodynamics and no evidence of pulmonary hypertension.   Medical therapy with guideline directed therapy for systolic heart failure Consider RCA PCI when patient is further out from surgery and is stronger and less ischemic symptoms develop.  Antimicrobials:    Objective: Vitals:   05/24/22 0459 05/24/22 0500 05/24/22 0600 05/24/22 0756  BP: 122/76 125/78 131/76 129/71  Pulse: 69 69 63 72  Resp: 20 18 17 19   Temp: (!) 97.5 F (36.4 C)   97.7 F (36.5 C)  TempSrc: Oral   Oral  SpO2: 98% 100% 97% 100%  Weight:  91.6 kg    Height:        Intake/Output Summary (Last 24 hours) at 05/24/2022 1206 Last data filed at 05/24/2022 0757 Gross per 24 hour  Intake 252.49 ml  Output 1725 ml  Net -1472.51 ml   Filed Weights   05/21/22 0532 05/22/22 0616 05/24/22 0500  Weight: 101.6 kg 97.8 kg 91.6 kg    Examination:  General exam: Pleasant chronically ill elderly male sitting up in the recliner, AAOx3, no distress HEENT: No JVD CVS: S1-S2, regular rhythm Lungs: Decreased  breath sounds at the bases Abdomen: Soft, nontender, bowel sounds present Extremities: Trace edema  Skin: No rashes Psychiatry:  Mood & affect appropriate.     Data Reviewed:   CBC: Recent Labs  Lab 05/18/22 1430 05/19/22 0223 05/20/22 0256 05/21/22 0313 05/23/22 1054 05/23/22 1123 05/24/22 0151  WBC 13.4* 12.3* 10.4 10.3  --   --  7.1  NEUTROABS 11.1*  --   --   --   --   --   --   HGB  11.5* 11.2* 11.1* 11.1* 12.6*  12.2* 11.9* 12.3*  HCT 36.0* 35.8* 34.4* 35.8* 37.0*  36.0* 35.0* 38.6*  MCV 95.0 95.0 92.0 95.7  --   --  92.6  PLT 326 297 266 321  --   --  306   Basic Metabolic Panel: Recent Labs  Lab 05/20/22 0256 05/21/22 0313 05/22/22 0603 05/23/22 0420 05/23/22 1054 05/23/22 1123 05/24/22 0151  NA 136 138 140 139 140  140 136 140  K 3.3* 3.8 3.9 4.1 3.7  3.6 3.5 3.6  CL 105 105 103 101  --   --  103  CO2 22 23 26 27   --   --  25  GLUCOSE 161* 156* 116* 114*  --   --  118*  BUN 26* 25* 26* 26*  --   --  25*  CREATININE 1.43* 1.38* 1.57* 1.58*  --   --  1.41*  CALCIUM 8.2* 8.5* 8.8* 8.8*  --   --  9.2  MG 2.0 2.0 2.0 2.1  --   --  2.2   GFR: Estimated Creatinine Clearance: 46.4 mL/min (A) (by C-G formula based on SCr of 1.41 mg/dL (H)). Liver Function Tests: Recent Labs  Lab 05/18/22 1430  AST 19  ALT 33  ALKPHOS 67  BILITOT 1.1  PROT 6.0*  ALBUMIN 3.2*   No results for input(s): "LIPASE", "AMYLASE" in the last 168 hours. No results for input(s): "AMMONIA" in the last 168 hours. Coagulation Profile: Recent Labs  Lab 05/18/22 1837  INR 1.8*   Cardiac Enzymes: No results for input(s): "CKTOTAL", "CKMB", "CKMBINDEX", "TROPONINI" in the last 168 hours. BNP (last 3 results) No results for input(s): "PROBNP" in the last 8760 hours. HbA1C: No results for input(s): "HGBA1C" in the last 72 hours. CBG: Recent Labs  Lab 05/23/22 0626 05/23/22 1314 05/23/22 1607 05/23/22 2110 05/24/22 0619  GLUCAP 111* 106* 121* 145* 115*   Lipid Profile: No results for input(s): "CHOL", "HDL", "LDLCALC", "TRIG", "CHOLHDL", "LDLDIRECT" in the last 72 hours. Thyroid Function Tests: No results for input(s): "TSH", "T4TOTAL", "FREET4", "T3FREE", "THYROIDAB" in the last 72 hours. Anemia Panel: No results for input(s): "VITAMINB12", "FOLATE", "FERRITIN", "TIBC", "IRON", "RETICCTPCT" in the last 72 hours. Urine analysis:    Component Value Date/Time    COLORURINE AMBER (A) 05/18/2022 1430   APPEARANCEUR HAZY (A) 05/18/2022 1430   APPEARANCEUR Cloudy 09/25/2013 2024   LABSPEC 1.020 05/18/2022 1430   LABSPEC 1.015 09/25/2013 2024   PHURINE 5.0 05/18/2022 1430   GLUCOSEU NEGATIVE 05/18/2022 1430   GLUCOSEU Negative 09/25/2013 2024   HGBUR SMALL (A) 05/18/2022 1430   BILIRUBINUR NEGATIVE 05/18/2022 1430   BILIRUBINUR Negative 09/25/2013 2024   KETONESUR NEGATIVE 05/18/2022 1430   PROTEINUR 100 (A) 05/18/2022 1430   NITRITE NEGATIVE 05/18/2022 1430   LEUKOCYTESUR SMALL (A) 05/18/2022 1430   LEUKOCYTESUR 3+ 09/25/2013 2024   Sepsis Labs: @LABRCNTIP (procalcitonin:4,lacticidven:4)  ) Recent Results (from the past 240 hour(s))  Resp Panel by RT-PCR (Flu A&B, Covid) Anterior  Nasal Swab     Status: None   Collection Time: 05/18/22  2:57 PM   Specimen: Anterior Nasal Swab  Result Value Ref Range Status   SARS Coronavirus 2 by RT PCR NEGATIVE NEGATIVE Final    Comment: (NOTE) SARS-CoV-2 target nucleic acids are NOT DETECTED.  The SARS-CoV-2 RNA is generally detectable in upper respiratory specimens during the acute phase of infection. The lowest concentration of SARS-CoV-2 viral copies this assay can detect is 138 copies/mL. A negative result does not preclude SARS-Cov-2 infection and should not be used as the sole basis for treatment or other patient management decisions. A negative result may occur with  improper specimen collection/handling, submission of specimen other than nasopharyngeal swab, presence of viral mutation(s) within the areas targeted by this assay, and inadequate number of viral copies(<138 copies/mL). A negative result must be combined with clinical observations, patient history, and epidemiological information. The expected result is Negative.  Fact Sheet for Patients:  BloggerCourse.com  Fact Sheet for Healthcare Providers:  SeriousBroker.it  This test is no  t yet approved or cleared by the [place] FDA and  has been authorized for detection and/or diagnosis of SARS-CoV-2 by FDA under an Emergency Use Authorization (EUA). This EUA will remain  in effect (meaning this test can be used) for the duration of the COVID-19 declaration under Section 564(b)(1) of the Act, 21 U.S.C.section 360bbb-3(b)(1), unless the authorization is terminated  or revoked sooner.       Influenza A by PCR NEGATIVE NEGATIVE Final   Influenza B by PCR NEGATIVE NEGATIVE Final    Comment: (NOTE) The Xpert Xpress SARS-CoV-2/FLU/RSV plus assay is intended as an aid in the diagnosis of influenza from Nasopharyngeal swab specimens and should not be used as a sole basis for treatment. Nasal washings and aspirates are unacceptable for Xpert Xpress SARS-CoV-2/FLU/RSV testing.  Fact Sheet for Patients: BloggerCourse.com  Fact Sheet for Healthcare Providers: SeriousBroker.it  This test is not yet approved or cleared by the [place] FDA and has been authorized for detection and/or diagnosis of SARS-CoV-2 by FDA under an Emergency Use Authorization (EUA). This EUA will remain in effect (meaning this test can be used) for the duration of the COVID-19 declaration under Section 564(b)(1) of the Act, 21 U.S.C. section 360bbb-3(b)(1), unless the authorization is terminated or revoked.  Performed at [facility], [address] [place], [place] [address]      Radiology Studies: DG Chest 2 View  Result Date: 05/24/2022 CLINICAL DATA:  Shob/ RIGHT/LEFT HEART CATH AND CORONARY/GRAFT ANGIOGRAPHY 9/1/2023Evaluatepneumothorax.atelectasis. EXAM: CHEST - 2 VIEW COMPARISON:  05/22/2022.  CT, 05/18/2022. FINDINGS: No pneumothorax. Cardiac silhouette normal in size. Stable changes from the recent CABG surgery. No mediastinal widening. Mild basilar atelectasis with probable small effusions. Remainder of the lungs is clear.  IMPRESSION: 1. No acute findings. 2. No pneumothorax. 3. Mild basilar atelectasis with probable small effusions. Electronically Signed   By: Amie Portland M.D.   On: 05/24/2022 08:41   CARDIAC CATHETERIZATION  Result Date: 05/23/2022 CONCLUSIONS: Occluded sequential bypass graft to diagonal, obtuse marginal, and PL branch. Occluded saphenous vein graft to the PDA. Patent LIMA to mid LAD. Patent left main with 50% distal disease.  Subtotal occlusion of the mid LAD with patent diagonals 1 through 3.. Patent large first obtuse marginal and small ramus intermedius.  The proximal to mid circumflex contains 50 to 70% stenosis. Native right coronary is patent with moderate diffuse disease proximal to distal.  Focal eccentric 60 to  70% stenosis before the origin of the PDA and left ventricular branches appearing somewhat improved compared to pre-CABG. Global hypokinesis with EF 25%.  LVEDP normal. Normal pulmonary artery pressures.  Mean pulmonary capillary wedge pressure 9 mmHg.  Normal right atrial pressure. RECOMMENDATIONS: Guideline directed therapy for systolic heart failure Consider percutaneous revascularization of distal RCA and circumflex obtuse marginal if angina or other clinical symptoms     Scheduled Meds:  aspirin  81 mg Oral Daily   atorvastatin  80 mg Oral Daily   docusate sodium  100 mg Oral BID   empagliflozin  10 mg Oral Daily   feeding supplement  237 mL Oral BID BM   furosemide  20 mg Oral BID   insulin aspart  0-15 Units Subcutaneous TID WC   metoprolol succinate  100 mg Oral QHS   multivitamin with minerals  1 tablet Oral Daily   sodium chloride flush  3 mL Intravenous Q12H   sodium chloride flush  3 mL Intravenous Q12H   sodium chloride flush  3 mL Intravenous Q12H   Continuous Infusions:  sodium chloride     heparin 1,500 Units/hr (05/23/22 2009)     LOS: 5 days    Time spent: 38min  Domenic Polite, MD [facility]   05/24/2022, 12:06 PM

## 2022-05-24 NOTE — Progress Notes (Signed)
ANTICOAGULATION CONSULT NOTE  Pharmacy Consult for Heparin Indication: New PE (CABG 04/2022 discharged on Eliquis for Afib)  No Known Allergies  Patient Measurements: Height: 6\' 1"  (185.4 cm) Weight: 91.6 kg (201 lb 14.4 oz) IBW/kg (Calculated) : 79.9 Heparin Dosing Weight: 96.2 kg  Vital Signs: Temp: 97.7 F (36.5 C) (09/02 0756) Temp Source: Oral (09/02 0756) BP: 129/71 (09/02 0756) Pulse Rate: 72 (09/02 0756)  Labs: Recent Labs    05/22/22 0603 05/22/22 1651 05/23/22 0420 05/23/22 1054 05/23/22 1054 05/23/22 1123 05/24/22 0151 05/24/22 1104  HGB  --   --   --  12.6*  12.2*   < > 11.9* 12.3*  --   HCT  --   --   --  37.0*  36.0*  --  35.0* 38.6*  --   PLT  --   --   --   --   --   --  306  --   APTT 109*   < > 87*  --   --   --  56* 65*  HEPARINUNFRC >1.10*  --  >1.10*  --   --   --  >1.10*  --   CREATININE 1.57*  --  1.58*  --   --   --  1.41*  --    < > = values in this interval not displayed.     Estimated Creatinine Clearance: 46.4 mL/min (A) (by C-G formula based on SCr of 1.41 mg/dL (H)).   Medical History: Past Medical History:  Diagnosis Date   Arthritis    History of kidney stones    Hyperlipidemia    Hypertension    Nocturia    S/P CABG (coronary artery bypass graft)     Medications:  Medications Prior to Admission  Medication Sig Dispense Refill Last Dose   apixaban (ELIQUIS) 5 MG TABS tablet Take 1 tablet (5 mg total) by mouth 2 (two) times daily. 60 tablet 3 05/18/2022 at 7:00am   aspirin EC 81 MG tablet Take 81 mg by mouth daily.   05/18/2022   atorvastatin (LIPITOR) 40 MG tablet Take 1 tablet (40 mg total) by mouth daily. 30 tablet 3 05/18/2022   cyanocobalamin (VITAMIN B12) 1000 MCG tablet Take 1,000 mcg by mouth daily.   05/18/2022   diltiazem (TIAZAC) 240 MG 24 hr capsule Take 240 mg by mouth daily.   05/18/2022   [START ON 06/15/2022] fosinopril (MONOPRIL) 20 MG tablet Take 1 tablet (20 mg total) by mouth daily.   05/18/2022   [START ON  06/15/2022] hydrochlorothiazide (HYDRODIURIL) 25 MG tablet Take 1 tablet (25 mg total) by mouth daily.   05/18/2022   isosorbide mononitrate (IMDUR) 30 MG 24 hr tablet Take 1 tablet (30 mg total) by mouth daily. 30 tablet 0 05/18/2022   metoprolol tartrate (LOPRESSOR) 25 MG tablet Take 1 tablet (25 mg total) by mouth 2 (two) times daily. 60 tablet 3 05/18/2022 at 7:00am   traMADol (ULTRAM) 50 MG tablet Take 1 tablet (50 mg total) by mouth every 4 (four) hours as needed for moderate pain. 30 tablet 0 Past Week    Assessment: 81 yo M presented with SOB, dry cough, fatigue, peripheral edema and hypoxia. Patient found to have new PE. Pt is s/p CABG on 8/17. Discharged on eliquis 5mg  BID for AF. Last dose 8/27 0700 PTA.   Patient apixaban held on admit and started on heparin. Last dose of apixaban on 8/30@0935 .  aPTT remains subtherapeutic at 65 seconds, on 1500 units/hr. Hgb 12.3,  plt 306. No line issues or signs/symptoms of bleeding per RN.   F/u procedure plans to determine when apixaban can be re-initiated   Goal of Therapy:  Heparin level 0.3-0.7 units/ml aPTT 66-102 seconds Monitor platelets by anticoagulation protocol: Yes   Plan:  Increase heparin to 1600 units/hr Check aPTT and heparin level in 8hrs  Daily CBC and heparin level, aPTT Stop aPTT monitoring once heparin level and aPTT correlate Monitor for signs/symptoms of bleeding.   Cherylin Mylar, PharmD PGY1 Pharmacy Resident 9/2/202312:33 PM

## 2022-05-24 NOTE — Plan of Care (Signed)
  Problem: Education: Goal: Will demonstrate proper wound care and an understanding of methods to prevent future damage Outcome: Progressing Goal: Knowledge of disease or condition will improve Outcome: Progressing Goal: Knowledge of the prescribed therapeutic regimen will improve Outcome: Progressing Goal: Individualized Educational Video(s) Outcome: Progressing   Problem: Activity: Goal: Risk for activity intolerance will decrease Outcome: Progressing   Problem: Cardiac: Goal: Will achieve and/or maintain hemodynamic stability Outcome: Progressing   Problem: Clinical Measurements: Goal: Postoperative complications will be avoided or minimized Outcome: Progressing   Problem: Health Behavior/Discharge Planning: Goal: Ability to manage health-related needs will improve Outcome: Progressing   Problem: Education: Goal: Knowledge of General Education information will improve Description: Including pain rating scale, medication(s)/side effects and non-pharmacologic comfort measures Outcome: Progressing   Problem: Clinical Measurements: Goal: Ability to maintain clinical measurements within normal limits will improve Outcome: Progressing Goal: Will remain free from infection Outcome: Progressing Goal: Diagnostic test results will improve Outcome: Progressing Goal: Respiratory complications will improve Outcome: Progressing Goal: Cardiovascular complication will be avoided Outcome: Progressing   Problem: Nutrition: Goal: Adequate nutrition will be maintained Outcome: Progressing   Problem: Coping: Goal: Level of anxiety will decrease Outcome: Progressing

## 2022-05-24 NOTE — Progress Notes (Signed)
Mobility Specialist: Progress Note   05/24/22 1155  Mobility  Activity Ambulated with assistance in hallway  Level of Assistance Standby assist, set-up cues, supervision of patient - no hands on  Assistive Device None  Distance Ambulated (ft) 940 ft  Activity Response Tolerated well  $Mobility charge 1 Mobility   Pre-Mobility: 75 HR Post-Mobility: 72 HR, 119/69 (85) BP, 100% SpO2  Received pt in chair having no complaints and agreeable to mobility. Pt was asymptomatic throughout ambulation and returned to room w/o fault. Left in chair w/ call bell in reach and all needs met.  [facility] Ernestene Coover Mobility Specialist Mobility Specialist [address]: [phone]

## 2022-05-24 NOTE — Progress Notes (Signed)
ANTICOAGULATION CONSULT NOTE - Follow Up Consult  Pharmacy Consult for heparin Indication: atrial fibrillation and pulmonary embolus  Labs: Recent Labs    05/22/22 0603 05/22/22 1651 05/23/22 0420 05/23/22 1054 05/23/22 1054 05/23/22 1123 05/24/22 0151  HGB  --   --   --  12.6*  12.2*   < > 11.9* 12.3*  HCT  --   --   --  37.0*  36.0*  --  35.0* 38.6*  PLT  --   --   --   --   --   --  306  APTT 109* 62* 87*  --   --   --  56*  HEPARINUNFRC >1.10*  --  >1.10*  --   --   --  >1.10*  CREATININE 1.57*  --  1.58*  --   --   --   --    < > = values in this interval not displayed.     Assessment/Plan:  81yo male subtherapeutic on heparin after resuming post-cath but was previously therapeutic and likely needs more time to accumulate. Will continue infusion at current rate of 1500 units/hr and check additional PTT.   Vernard Gambles, PharmD, BCPS  05/24/2022,3:19 AM

## 2022-05-24 NOTE — Progress Notes (Signed)
Rounding Note    Patient Name: [name] Goodman Date of Encounter: 05/24/2022  [facility] Cardiologist: Orbie Pyo, MD   Subjective   In chair.  Feeling better. No chest pain. Awaiting PCI Tuesday.  Inpatient Medications    Scheduled Meds:  aspirin  81 mg Oral Daily   atorvastatin  80 mg Oral Daily   docusate sodium  100 mg Oral BID   empagliflozin  10 mg Oral Daily   feeding supplement  237 mL Oral BID BM   furosemide  20 mg Oral BID   insulin aspart  0-15 Units Subcutaneous TID WC   metoprolol succinate  100 mg Oral QHS   multivitamin with minerals  1 tablet Oral Daily   sodium chloride flush  3 mL Intravenous Q12H   sodium chloride flush  3 mL Intravenous Q12H   sodium chloride flush  3 mL Intravenous Q12H   Continuous Infusions:  sodium chloride     heparin 1,500 Units/hr (05/23/22 2009)   PRN Meds: sodium chloride, acetaminophen, bisacodyl, hydrALAZINE, ipratropium-albuterol, ondansetron **OR** ondansetron (ZOFRAN) IV, ondansetron (ZOFRAN) IV, polyethylene glycol, sodium chloride flush, traMADol, traZODone   Vital Signs    Vitals:   05/24/22 0459 05/24/22 0500 05/24/22 0600 05/24/22 0756  BP: 122/76 125/78 131/76 129/71  Pulse: 69 69 63 72  Resp: 20 18 17 19   Temp: (!) 97.5 F (36.4 C)   97.7 F (36.5 C)  TempSrc: Oral   Oral  SpO2: 98% 100% 97% 100%  Weight:  91.6 kg    Height:        Intake/Output Summary (Last 24 hours) at 05/24/2022 1145 Last data filed at 05/24/2022 0757 Gross per 24 hour  Intake 252.49 ml  Output 1725 ml  Net -1472.51 ml      05/24/2022    5:00 AM 05/22/2022    6:16 AM 05/21/2022    5:32 AM  Last 3 Weights  Weight (lbs) 201 lb 14.4 oz 215 lb 9.8 oz 224 lb  Weight (kg) 91.581 kg 97.8 kg 101.606 kg      Telemetry    Sinus rhythm with occasional PVCs- Personally Reviewed  ECG    No new- Personally Reviewed  Physical Exam   GEN: No acute distress.   Neck: No JVD Cardiac: RRR, no murmurs, rubs, or  gallops.  CABG scar Respiratory: Clear to auscultation bilaterally. GI: Soft, nontender, non-distended  MS: No edema; No deformity. Neuro:  Nonfocal  Psych: Normal affect   Labs    High Sensitivity Troponin:   Recent Labs  Lab 05/18/22 1430 05/18/22 1722  TROPONINIHS 277* 255*     Chemistry Recent Labs  Lab 05/18/22 1430 05/19/22 0223 05/22/22 0603 05/23/22 0420 05/23/22 1054 05/23/22 1123 05/24/22 0151  NA 136   < > 140 139 140  140 136 140  K 4.6   < > 3.9 4.1 3.7  3.6 3.5 3.6  CL 103   < > 103 101  --   --  103  CO2 23   < > 26 27  --   --  25  GLUCOSE 141*   < > 116* 114*  --   --  118*  BUN 21   < > 26* 26*  --   --  25*  CREATININE 1.33*   < > 1.57* 1.58*  --   --  1.41*  CALCIUM 8.4*   < > 8.8* 8.8*  --   --  9.2  MG  --    < >  2.0 2.1  --   --  2.2  PROT 6.0*  --   --   --   --   --   --   ALBUMIN 3.2*  --   --   --   --   --   --   AST 19  --   --   --   --   --   --   ALT 33  --   --   --   --   --   --   ALKPHOS 67  --   --   --   --   --   --   BILITOT 1.1  --   --   --   --   --   --   GFRNONAA 54*   < > 44* 44*  --   --  50*  ANIONGAP 10   < > 11 11  --   --  12   < > = values in this interval not displayed.    Lipids No results for input(s): "CHOL", "TRIG", "HDL", "LABVLDL", "LDLCALC", "CHOLHDL" in the last 168 hours.  Hematology Recent Labs  Lab 05/20/22 0256 05/21/22 0313 05/23/22 1054 05/23/22 1123 05/24/22 0151  WBC 10.4 10.3  --   --  7.1  RBC 3.74* 3.74*  --   --  4.17*  HGB 11.1* 11.1* 12.6*  12.2* 11.9* 12.3*  HCT 34.4* 35.8* 37.0*  36.0* 35.0* 38.6*  MCV 92.0 95.7  --   --  92.6  MCH 29.7 29.7  --   --  29.5  MCHC 32.3 31.0  --   --  31.9  RDW 15.0 14.9  --   --  14.8  PLT 266 321  --   --  306   Thyroid No results for input(s): "TSH", "FREET4" in the last 168 hours.  BNP Recent Labs  Lab 05/18/22 1430 05/21/22 0313  BNP 961.0* 576.9*    DDimer No results for input(s): "DDIMER" in the last 168 hours.   Radiology     DG Chest 2 View  Result Date: 05/24/2022 CLINICAL DATA:  [facility]/ RIGHT/LEFT HEART CATH AND CORONARY/GRAFT ANGIOGRAPHY 9/1/2023Evaluatepneumothorax.atelectasis. EXAM: CHEST - 2 VIEW COMPARISON:  05/22/2022.  CT, 05/18/2022. FINDINGS: No pneumothorax. Cardiac silhouette normal in size. Stable changes from the recent CABG surgery. No mediastinal widening. Mild basilar atelectasis with probable small effusions. Remainder of the lungs is clear. IMPRESSION: 1. No acute findings. 2. No pneumothorax. 3. Mild basilar atelectasis with probable small effusions. Electronically Signed   By: Lajean Manes M.D.   On: 05/24/2022 08:41   CARDIAC CATHETERIZATION  Result Date: 05/23/2022 CONCLUSIONS: Occluded sequential bypass graft to diagonal, obtuse marginal, and PL branch. Occluded saphenous vein graft to the PDA. Patent LIMA to mid LAD. Patent left main with 50% distal disease.  Subtotal occlusion of the mid LAD with patent diagonals 1 through 3.. Patent large first obtuse marginal and small ramus intermedius.  The proximal to mid circumflex contains 50 to 70% stenosis. Native right coronary is patent with moderate diffuse disease proximal to distal.  Focal eccentric 60 to 70% stenosis before the origin of the PDA and left ventricular branches appearing somewhat improved compared to pre-CABG. Global hypokinesis with EF 25%.  LVEDP normal. Normal pulmonary artery pressures.  Mean pulmonary capillary wedge pressure 9 mmHg.  Normal right atrial pressure. RECOMMENDATIONS: Guideline directed therapy for systolic heart failure Consider percutaneous revascularization of distal RCA and circumflex obtuse marginal if angina  or other clinical symptoms    Cardiac Studies   Cath  Occluded SVG to RCA/PDA, and occluded SVG sequential to diagonal, ramus, and PL branch. LIMA is patent to mid LAD. Severe LV systolic dysfunction with EF 20 to 25%. Severe RCA disease and native LCA disease. There are targets for PCI. Probably better  treated after on good HF therapy and kidney function okay. Normal hemodynamics and no evidence of pulmonary hypertension.   Medical therapy with guideline directed therapy for systolic heart failure Consider RCA PCI when patient is further out from surgery and is stronger and less ischemic symptoms develop.   Patient Profile     81 y.o. male with ischemic cardiomyopathy EF 20 to 25% status post recent CABG with heart catheterization as above.  Assessment & Plan    Acute on chronic systolic heart failure secondary to ischemic cardiomyopathy status post CABG -EF now 25%, LIMA is patent. -Occluded SVG to RCA, occluded SVG sequential to diagonal ramus and PL branch. -Wife showed me diagrams drawn out by Dr. Lynnette Caffey.  She stated that plan was to perform cardiac catheterization on Tuesday with PCI to RCA as well as PCI to circumflex artery.  We will hold off on transitioning to p.o. Eliquis at this point.  Continue with IV heparin for now. -Continue with Jardiance, Toprol XL 100 mg. -Creatinine is trending downward.  Blood pressure fairly soft.  Could consider addition of low-dose Entresto tomorrow if we continue to have in the right direction. -On low-dose furosemide 20 mg twice a day p.o.  Paroxysmal atrial fibrillation - Currently maintaining sinus rhythm.  On IV heparin secondary to recent cardiac catheterization and planned PCI on Tuesday.  Chronic kidney disease stage IIIa - Fairly stable 1.43-1.58  Acute pulmonary embolism - Currently on IV heparin.    CAD - Prior 5 vessel CABG 05/08/2022-LIMA to LAD is currently patent.  See above.  Other grafts have failed.  According to patient's wife, planned PCI on Tuesday to RCA and circumflex.  Once again she showed me diagrams that were written out by interventional cardiologist.  She specifically said that the procedure would be done on Tuesday. -Continue with aspirin statin and metoprolol.   For questions or updates, please contact [facility] Please consult [email or web address] for contact info under        Signed, Donato Schultz, MD  05/24/2022, 11:45 AM

## 2022-05-24 NOTE — Progress Notes (Signed)
Mobility Specialist: Progress Note   05/24/22 1440  Mobility  Activity Ambulated independently in hallway  Level of Assistance Independent  Assistive Device None  Distance Ambulated (ft) 1200 ft  Activity Response Tolerated well  $Mobility charge 1 Mobility   Received pt in chair having no complaints and agreeable to mobility. Pt was asymptomatic throughout ambulation and returned to room w/o fault. Left in chair w/ call bell in reach and all needs met.  [facility] Mobility Specialist Mobility Specialist [address]: [phone]

## 2022-05-25 DIAGNOSIS — I5021 Acute systolic (congestive) heart failure: Secondary | ICD-10-CM | POA: Diagnosis not present

## 2022-05-25 DIAGNOSIS — Z951 Presence of aortocoronary bypass graft: Secondary | ICD-10-CM | POA: Diagnosis not present

## 2022-05-25 LAB — GLUCOSE, CAPILLARY
Glucose-Capillary: 127 mg/dL — ABNORMAL HIGH (ref 70–99)
Glucose-Capillary: 177 mg/dL — ABNORMAL HIGH (ref 70–99)
Glucose-Capillary: 108 mg/dL — ABNORMAL HIGH (ref 70–99)
Glucose-Capillary: 103 mg/dL — ABNORMAL HIGH (ref 70–99)

## 2022-05-25 LAB — APTT
aPTT: 62 seconds — ABNORMAL HIGH (ref 24–36)
aPTT: 95 seconds — ABNORMAL HIGH (ref 24–36)
aPTT: 100 seconds — ABNORMAL HIGH (ref 24–36)

## 2022-05-25 LAB — CBC
HCT: 36.8 % — ABNORMAL LOW (ref 39.0–52.0)
Hemoglobin: 12 g/dL — ABNORMAL LOW (ref 13.0–17.0)
MCH: 29.8 pg (ref 26.0–34.0)
MCHC: 32.6 g/dL (ref 30.0–36.0)
MCV: 91.3 fL (ref 80.0–100.0)
Platelets: 257 10*3/uL (ref 150–400)
RBC: 4.03 MIL/uL — ABNORMAL LOW (ref 4.22–5.81)
RDW: 14.6 % (ref 11.5–15.5)
WBC: 6.4 10*3/uL (ref 4.0–10.5)
nRBC: 0 % (ref 0.0–0.2)

## 2022-05-25 LAB — BASIC METABOLIC PANEL
Anion gap: 8 (ref 5–15)
BUN: 25 mg/dL — ABNORMAL HIGH (ref 8–23)
CO2: 25 mmol/L (ref 22–32)
Calcium: 9 mg/dL (ref 8.9–10.3)
Chloride: 105 mmol/L (ref 98–111)
Creatinine, Ser: 1.42 mg/dL — ABNORMAL HIGH (ref 0.61–1.24)
GFR, Estimated: 50 mL/min — ABNORMAL LOW (ref >60)
Glucose, Bld: 120 mg/dL — ABNORMAL HIGH (ref 70–99)
Potassium: 4 mmol/L (ref 3.5–5.1)
Sodium: 138 mmol/L (ref 135–145)

## 2022-05-25 LAB — MAGNESIUM: Magnesium: 2.2 mg/dL (ref 1.7–2.4)

## 2022-05-25 LAB — LIPOPROTEIN A (LPA): Lipoprotein (a): 184.2 nmol/L — ABNORMAL HIGH (ref <75.0)

## 2022-05-25 LAB — HEPARIN LEVEL (UNFRACTIONATED): Heparin Unfractionated: 0.95 IU/mL — ABNORMAL HIGH (ref 0.30–0.70)

## 2022-05-25 NOTE — Progress Notes (Signed)
ANTICOAGULATION CONSULT NOTE - Follow Up Consult  Pharmacy Consult for heparin Indication: atrial fibrillation and pulmonary embolus  Labs: Recent Labs    05/22/22 0603 05/22/22 1651 05/23/22 0420 05/23/22 1054 05/23/22 1123 05/24/22 0151 05/24/22 1104 05/25/22 0137  HGB  --   --   --    < > 11.9* 12.3*  --  12.0*  HCT  --   --   --    < > 35.0* 38.6*  --  36.8*  PLT  --   --   --   --   --  306  --  257  APTT 109*   < > 87*  --   --  56* 65* 62*  HEPARINUNFRC >1.10*  --  >1.10*  --   --  >1.10*  --   --   CREATININE 1.57*  --  1.58*  --   --  1.41*  --  1.42*   < > = values in this interval not displayed.    Assessment: 80yo male subtherapeutic on heparin with lower PTT despite increased rate; no infusion issues or signs of bleeding per RN.  Goal of Therapy:  aPTT 66-102 seconds   Plan:  Will increase heparin infusion by ~1 unit/kg/hr to 1700 units/hr and check PTT in 8 hours.    Vernard Gambles, PharmD, BCPS  05/25/2022,2:48 AM

## 2022-05-25 NOTE — Progress Notes (Signed)
Mobility Specialist: Progress Note   05/25/22 1100  Mobility  Activity Ambulated independently in hallway  Level of Assistance Modified independent, requires aide device or extra time  Assistive Device Other (Comment) (IV pole)  Distance Ambulated (ft) 1200 ft  Activity Response Tolerated well  $Mobility charge 1 Mobility   Received pt in chair having no complaints and agreeable to mobility. Pt was asymptomatic throughout ambulation and returned to room w/o fault. Left in chair w/ call bell in reach and all needs met. Encouraged pt to ambulate independently today, RN notified.   [facility] Demetrie Borge Mobility Specialist Mobility Specialist [address]: [phone]

## 2022-05-25 NOTE — Progress Notes (Signed)
Rounding Note    Patient Name: [name] Goodman Date of Encounter: 05/25/2022  [place] Cardiologist: Early Osmond, MD   Subjective   Doing well, no chest discomfort Awaiting PCI Tuesday.  Inpatient Medications    Scheduled Meds:  aspirin  81 mg Oral Daily   atorvastatin  80 mg Oral Daily   docusate sodium  100 mg Oral BID   empagliflozin  10 mg Oral Daily   feeding supplement  237 mL Oral BID BM   furosemide  20 mg Oral BID   insulin aspart  0-15 Units Subcutaneous TID WC   metoprolol succinate  100 mg Oral QHS   multivitamin with minerals  1 tablet Oral Daily   sodium chloride flush  3 mL Intravenous Q12H   sodium chloride flush  3 mL Intravenous Q12H   sodium chloride flush  3 mL Intravenous Q12H   Continuous Infusions:  sodium chloride     heparin 1,700 Units/hr (05/25/22 0400)   PRN Meds: sodium chloride, acetaminophen, bisacodyl, hydrALAZINE, ipratropium-albuterol, ondansetron **OR** ondansetron (ZOFRAN) IV, ondansetron (ZOFRAN) IV, polyethylene glycol, sodium chloride flush, traMADol, traZODone   Vital Signs    Vitals:   05/24/22 1930 05/25/22 0029 05/25/22 0300 05/25/22 1100  BP: 115/63 (!) 114/53 123/76   Pulse: 77 76 72   Resp: 20 15 15    Temp: 98 F (36.7 C) 98 F (36.7 C) 98.1 F (36.7 C)   TempSrc: Oral Oral Oral   SpO2: 96% 96% 97%   Weight:    91.6 kg  Height:    6\' 1"  (1.854 m)    Intake/Output Summary (Last 24 hours) at 05/25/2022 1231 Last data filed at 05/25/2022 0500 Gross per 24 hour  Intake 378.15 ml  Output 100 ml  Net 278.15 ml      05/25/2022   11:00 AM 05/24/2022    5:00 AM 05/22/2022    6:16 AM  Last 3 Weights  Weight (lbs) 201 lb 14.4 oz 201 lb 14.4 oz 215 lb 9.8 oz  Weight (kg) 91.581 kg 91.581 kg 97.8 kg      Telemetry    Sinus rhythm with occasional PVCs, no changes- Personally Reviewed  ECG    No new- Personally Reviewed  Physical Exam   GEN: No acute distress.   Neck: No JVD Cardiac: RRR,  no murmurs, rubs, or gallops.  CABG scar, stable Respiratory: Clear to auscultation bilaterally. GI: Soft, nontender, non-distended  MS: No edema; No deformity. Neuro:  Nonfocal  Psych: Normal affect   Labs    High Sensitivity Troponin:   Recent Labs  Lab 05/18/22 1430 05/18/22 1722  TROPONINIHS 277* 255*     Chemistry Recent Labs  Lab 05/18/22 1430 05/19/22 0223 05/23/22 0420 05/23/22 1054 05/23/22 1123 05/24/22 0151 05/25/22 0137  NA 136   < > 139   < > 136 140 138  K 4.6   < > 4.1   < > 3.5 3.6 4.0  CL 103   < > 101  --   --  103 105  CO2 23   < > 27  --   --  25 25  GLUCOSE 141*   < > 114*  --   --  118* 120*  BUN 21   < > 26*  --   --  25* 25*  CREATININE 1.33*   < > 1.58*  --   --  1.41* 1.42*  CALCIUM 8.4*   < > 8.8*  --   --  9.2 9.0  MG  --    < > 2.1  --   --  2.2 2.2  PROT 6.0*  --   --   --   --   --   --   ALBUMIN 3.2*  --   --   --   --   --   --   AST 19  --   --   --   --   --   --   ALT 33  --   --   --   --   --   --   ALKPHOS 67  --   --   --   --   --   --   BILITOT 1.1  --   --   --   --   --   --   GFRNONAA 54*   < > 44*  --   --  50* 50*  ANIONGAP 10   < > 11  --   --  12 8   < > = values in this interval not displayed.    Lipids No results for input(s): "CHOL", "TRIG", "HDL", "LABVLDL", "LDLCALC", "CHOLHDL" in the last 168 hours.  Hematology Recent Labs  Lab 05/21/22 0313 05/23/22 1054 05/23/22 1123 05/24/22 0151 05/25/22 0137  WBC 10.3  --   --  7.1 6.4  RBC 3.74*  --   --  4.17* 4.03*  HGB 11.1*   < > 11.9* 12.3* 12.0*  HCT 35.8*   < > 35.0* 38.6* 36.8*  MCV 95.7  --   --  92.6 91.3  MCH 29.7  --   --  29.5 29.8  MCHC 31.0  --   --  31.9 32.6  RDW 14.9  --   --  14.8 14.6  PLT 321  --   --  306 257   < > = values in this interval not displayed.   Thyroid No results for input(s): "TSH", "FREET4" in the last 168 hours.  BNP Recent Labs  Lab 05/18/22 1430 05/21/22 0313  BNP 961.0* 576.9*    DDimer No results for input(s):  "DDIMER" in the last 168 hours.   Radiology    DG Chest 2 View  Result Date: 05/24/2022 CLINICAL DATA:  [place]/ RIGHT/LEFT HEART CATH AND CORONARY/GRAFT ANGIOGRAPHY 9/1/2023Evaluatepneumothorax.atelectasis. EXAM: CHEST - 2 VIEW COMPARISON:  05/22/2022.  CT, 05/18/2022. FINDINGS: No pneumothorax. Cardiac silhouette normal in size. Stable changes from the recent CABG surgery. No mediastinal widening. Mild basilar atelectasis with probable small effusions. Remainder of the lungs is clear. IMPRESSION: 1. No acute findings. 2. No pneumothorax. 3. Mild basilar atelectasis with probable small effusions. Electronically Signed   By: Amie Portland M.D.   On: 05/24/2022 08:41    Cardiac Studies   Cath this admission  Occluded SVG to RCA/PDA, and occluded SVG sequential to diagonal, ramus, and PL branch. LIMA is patent to mid LAD. Severe LV systolic dysfunction with EF 20 to 25%. Severe RCA disease and native LCA disease. There are targets for PCI. Probably better treated after on good HF therapy and kidney function okay. Normal hemodynamics and no evidence of pulmonary hypertension.   Medical therapy with guideline directed therapy for systolic heart failure Consider RCA PCI when patient is further out from surgery and is stronger and less ischemic symptoms develop.   Patient Profile     81 y.o. male with ischemic cardiomyopathy EF 20 to 25% status post recent CABG with heart catheterization as  above.  Assessment & Plan    Acute on chronic systolic heart failure secondary to ischemic cardiomyopathy status post CABG -EF now 25%, LIMA is patent. -Occluded SVG to RCA, occluded SVG sequential to diagonal ramus and PL branch. -Wife showed me diagrams drawn out by Dr. Lynnette Caffey.  She stated that plan was to perform cardiac catheterization on Tuesday with PCI to RCA as well as PCI to circumflex artery.  We will hold off on transitioning to p.o. Eliquis at this point.  Continue with IV heparin for now.  He  is on the Board.  Make n.p.o. past midnight on Monday -Continue with Jardiance, Toprol XL 100 mg. -Creatinine is trending downward (1.42).  Blood pressure fairly soft.  Could consider addition of low-dose Entresto post PCI if we continue to go in the right direction. -On low-dose furosemide 20 mg twice a day p.o. twice daily  Paroxysmal atrial fibrillation - Currently maintaining sinus rhythm.  On IV heparin secondary to recent cardiac catheterization and planned PCI on Tuesday.  Chronic kidney disease stage IIIa - Fairly stable 1.43-1.58  Acute pulmonary embolism - Currently on IV heparin.    CAD - Prior 5 vessel CABG 05/08/2022-LIMA to LAD is currently patent.  See above.  Other grafts have failed.  Planned PCI on Tuesday to RCA and circumflex.  I saw name on cath board.  -Continue with aspirin statin and metoprolol.   For questions or updates, please contact [facility] Please consult [email or web address] for contact info under        Signed, Donato Schultz, MD  05/25/2022, 12:31 PM

## 2022-05-25 NOTE — Progress Notes (Signed)
ANTICOAGULATION CONSULT NOTE  Pharmacy Consult for Heparin Indication: New PE (CABG 04/2022 discharged on Eliquis for Afib)  No Known Allergies  Patient Measurements: Height: 6\' 1"  (185.4 cm) Weight: 91.6 kg (201 lb 14.4 oz) IBW/kg (Calculated) : 79.9 Heparin Dosing Weight: 91.6 kg  Vital Signs: Temp: 98.1 F (36.7 C) (09/03 0300) Temp Source: Oral (09/03 0300) BP: 123/76 (09/03 0300) Pulse Rate: 72 (09/03 0300)  Labs: Recent Labs    05/23/22 0420 05/23/22 1054 05/23/22 1123 05/24/22 0151 05/24/22 1104 05/25/22 0137 05/25/22 1100  HGB  --    < > 11.9* 12.3*  --  12.0*  --   HCT  --    < > 35.0* 38.6*  --  36.8*  --   PLT  --   --   --  306  --  257  --   APTT 87*  --   --  56* 65* 62* 95*  HEPARINUNFRC >1.10*  --   --  >1.10*  --  0.95*  --   CREATININE 1.58*  --   --  1.41*  --  1.42*  --    < > = values in this interval not displayed.     Estimated Creatinine Clearance: 46.1 mL/min (A) (by C-G formula based on SCr of 1.42 mg/dL (H)).   Medical History: Past Medical History:  Diagnosis Date   Arthritis    History of kidney stones    Hyperlipidemia    Hypertension    Nocturia    S/P CABG (coronary artery bypass graft)     Medications:  Medications Prior to Admission  Medication Sig Dispense Refill Last Dose   apixaban (ELIQUIS) 5 MG TABS tablet Take 1 tablet (5 mg total) by mouth 2 (two) times daily. 60 tablet 3 05/18/2022 at 7:00am   aspirin EC 81 MG tablet Take 81 mg by mouth daily.   05/18/2022   atorvastatin (LIPITOR) 40 MG tablet Take 1 tablet (40 mg total) by mouth daily. 30 tablet 3 05/18/2022   cyanocobalamin (VITAMIN B12) 1000 MCG tablet Take 1,000 mcg by mouth daily.   05/18/2022   diltiazem (TIAZAC) 240 MG 24 hr capsule Take 240 mg by mouth daily.   05/18/2022   [START ON 06/15/2022] fosinopril (MONOPRIL) 20 MG tablet Take 1 tablet (20 mg total) by mouth daily.   05/18/2022   [START ON 06/15/2022] hydrochlorothiazide (HYDRODIURIL) 25 MG tablet Take 1  tablet (25 mg total) by mouth daily.   05/18/2022   isosorbide mononitrate (IMDUR) 30 MG 24 hr tablet Take 1 tablet (30 mg total) by mouth daily. 30 tablet 0 05/18/2022   metoprolol tartrate (LOPRESSOR) 25 MG tablet Take 1 tablet (25 mg total) by mouth 2 (two) times daily. 60 tablet 3 05/18/2022 at 7:00am   traMADol (ULTRAM) 50 MG tablet Take 1 tablet (50 mg total) by mouth every 4 (four) hours as needed for moderate pain. 30 tablet 0 Past Week    Assessment: 81 yo M presented with SOB, dry cough, fatigue, peripheral edema and hypoxia. Patient found to have new PE. Pt is s/p CABG on 8/17. Discharged on eliquis 5mg  BID for AF.  Patient apixaban held on admit and started on heparin. Last dose of apixaban on 8/30@0935 .  aPTT therapeutic at 95 seconds, on 1700 units/hr. Hgb 12, plt 257. No line issues or signs/symptoms of bleeding reported.   Goal of Therapy:  Heparin level 0.3-0.7 units/ml aPTT 66-102 seconds Monitor platelets by anticoagulation protocol: Yes   Plan:  Continue heparin  at 1700 units/hr. Check aPTT in 8hrs. Monitor CBC, heparin level, aPTT, s/sx of bleeding daily. Stop aPTT monitoring once heparin level and aPTT correlate. F/u procedure plans to determine oral anticoagulation.   Cherylin Mylar, PharmD PGY1 Pharmacy Resident 9/3/202312:01 PM

## 2022-05-25 NOTE — Progress Notes (Signed)
PROGRESS NOTE    [name] Goodman  ZYS:[id number] DOB: 29-Nov-1940 DOA: 05/18/2022 PCP: [name], MD   81 y.o. male with medical history significant of HTN and HLD presenting with SOB.  He was last hospitalized by CT surgery form 8/17-24 for CABG x 5.  Postop also developed A-fib taking Eliquis.  Presented to the ED with dyspnea on exertion, CTA chest noted pulmonary embolism, bilateral pleural effusions -Status post left-sided thoracentesis with 1.6 L fluid removal on 8/28. -2D echo noted drop in EF down to 25% -Left heart cath noted occluded SVG to RCA/PDA and occluded SVG to diagonal, ramus and PL branch, LIMA to male mid LAD is patent, severe RCA disease and native LCA disease, severe LV dysfunction with EF of 20-25%   Subjective: -Feels okay, breathing better, walking in the halls, waiting for heart cath  Assessment and Plan:  Acute on chronic systolic CHF Bilateral pleural effusions -Recent CABG 8/17 -Echo with EF down to 25-30%, down from around 40-45% on intra-op TEE 8/17 -Underwent thoracentesis 8/28, 1.6 L drained -Cardiology following, event cardiac cath yesterday, prior 5 vessel CABG on 8/17, LIMA to LAD is patent, all other grafts have failed, plan for PCI to RCA and circumflex on Tuesday -Diuresed with IV Lasix he is 7.9 L negative, now on oral Lasix -Continue beta-blocker, Jardiance  Acute PE  Acute hypoxic respiratory failure -Second related to CHF, pleural effusions and PE  -Remains on IV heparin, plan for PCI to RCA and circumflex on Tuesday -Switch to oral Eliquis post cath   Chronic atrial fibrillation -In A-fib, heart rate controlled, metoprolol dose increased, Cardizem discontinued with low EF -Continue IV heparin for now  CKD 3a -Stable, monitor with diuresis  Hypokalemia -Replaced   HTN -Stable   HLD -Continue atorvastatin   DVT prophylaxis: Heparin drip Code Status: Full code Family Communication: Discussed with son and friend at at  bedside Disposition Plan: Home likely Wednesday if stable  Consultants: Cardiology, T CTS   Procedures: Thoracentesis  left heart cath: Occluded SVG to RCA/PDA, and occluded SVG sequential to diagonal, ramus, and PL branch. LIMA is patent to mid LAD. Severe LV systolic dysfunction with EF 20 to 25%. Severe RCA disease and native LCA disease. There are targets for PCI. Probably better treated after on good HF therapy and kidney function okay. Normal hemodynamics and no evidence of pulmonary hypertension.   Medical therapy with guideline directed therapy for systolic heart failure Consider RCA PCI when patient is further out from surgery and is stronger and less ischemic symptoms develop.  Antimicrobials:    Objective: Vitals:   05/24/22 1719 05/24/22 1930 05/25/22 0029 05/25/22 0300  BP: 133/74 115/63 (!) 114/53 123/76  Pulse: 73 77 76 72  Resp: 16 20 15 15   Temp: 98.2 F (36.8 C) 98 F (36.7 C) 98 F (36.7 C) 98.1 F (36.7 C)  TempSrc: Oral Oral Oral Oral  SpO2: 100% 96% 96% 97%  Weight:      Height:        Intake/Output Summary (Last 24 hours) at 05/25/2022 1134 Last data filed at 05/25/2022 0500 Gross per 24 hour  Intake 378.15 ml  Output 100 ml  Net 278.15 ml   Filed Weights   05/21/22 0532 05/22/22 0616 05/24/22 0500  Weight: 101.6 kg 97.8 kg 91.6 kg    Examination:  General exam: Pleasant elderly male sitting up in the recliner, AAOx3, no distress HEENT: No JVD CVS: S1-S2, regular rhythm Lungs: Decreased breath sounds the bases  Abdomen: Soft, nontender, bowel sounds present Extremities: Trace edema  Skin: No rashes Psychiatry:  Mood & affect appropriate.     Data Reviewed:   CBC: Recent Labs  Lab 05/18/22 1430 05/19/22 0223 05/20/22 0256 05/21/22 0313 05/23/22 1054 05/23/22 1123 05/24/22 0151 05/25/22 0137  WBC 13.4* 12.3* 10.4 10.3  --   --  7.1 6.4  NEUTROABS 11.1*  --   --   --   --   --   --   --   HGB 11.5* 11.2* 11.1* 11.1* 12.6*   12.2* 11.9* 12.3* 12.0*  HCT 36.0* 35.8* 34.4* 35.8* 37.0*  36.0* 35.0* 38.6* 36.8*  MCV 95.0 95.0 92.0 95.7  --   --  92.6 91.3  PLT 326 297 266 321  --   --  306 [id number]   Basic Metabolic Panel: Recent Labs  Lab 05/21/22 0313 05/22/22 0603 05/23/22 0420 05/23/22 1054 05/23/22 1123 05/24/22 0151 05/25/22 0137  NA 138 140 139 140  140 136 140 138  K 3.8 3.9 4.1 3.7  3.6 3.5 3.6 4.0  CL 105 103 101  --   --  103 105  CO2 23 26 27   --   --  25 25  GLUCOSE 156* 116* 114*  --   --  118* 120*  BUN 25* 26* 26*  --   --  25* 25*  CREATININE 1.38* 1.57* 1.58*  --   --  1.41* 1.42*  CALCIUM 8.5* 8.8* 8.8*  --   --  9.2 9.0  MG 2.0 2.0 2.1  --   --  2.2 2.2   GFR: Estimated Creatinine Clearance: 46.1 mL/min (A) (by C-G formula based on SCr of 1.42 mg/dL (H)). Liver Function Tests: Recent Labs  Lab 05/18/22 1430  AST 19  ALT 33  ALKPHOS 67  BILITOT 1.1  PROT 6.0*  ALBUMIN 3.2*   No results for input(s): "LIPASE", "AMYLASE" in the last 168 hours. No results for input(s): "AMMONIA" in the last 168 hours. Coagulation Profile: Recent Labs  Lab 05/18/22 1837  INR 1.8*   Cardiac Enzymes: No results for input(s): "CKTOTAL", "CKMB", "CKMBINDEX", "TROPONINI" in the last 168 hours. BNP (last 3 results) No results for input(s): "PROBNP" in the last 8760 hours. HbA1C: No results for input(s): "HGBA1C" in the last 72 hours. CBG: Recent Labs  Lab 05/24/22 0619 05/24/22 1314 05/24/22 1726 05/24/22 2119 05/25/22 0600  GLUCAP 115* 109* 120* 116* 127*   Lipid Profile: No results for input(s): "CHOL", "HDL", "LDLCALC", "TRIG", "CHOLHDL", "LDLDIRECT" in the last 72 hours. Thyroid Function Tests: No results for input(s): "TSH", "T4TOTAL", "FREET4", "T3FREE", "THYROIDAB" in the last 72 hours. Anemia Panel: No results for input(s): "VITAMINB12", "FOLATE", "FERRITIN", "TIBC", "IRON", "RETICCTPCT" in the last 72 hours. Urine analysis:    Component Value Date/Time   COLORURINE AMBER  (A) 05/18/2022 1430   APPEARANCEUR HAZY (A) 05/18/2022 1430   APPEARANCEUR Cloudy 09/25/2013 2024   LABSPEC 1.020 05/18/2022 1430   LABSPEC 1.015 09/25/2013 2024   PHURINE 5.0 05/18/2022 1430   GLUCOSEU NEGATIVE 05/18/2022 1430   GLUCOSEU Negative 09/25/2013 2024   HGBUR SMALL (A) 05/18/2022 1430   BILIRUBINUR NEGATIVE 05/18/2022 1430   BILIRUBINUR Negative 09/25/2013 2024   KETONESUR NEGATIVE 05/18/2022 1430   PROTEINUR 100 (A) 05/18/2022 1430   NITRITE NEGATIVE 05/18/2022 1430   LEUKOCYTESUR SMALL (A) 05/18/2022 1430   LEUKOCYTESUR 3+ 09/25/2013 2024   Sepsis Labs: @LABRCNTIP (procalcitonin:4,lacticidven:4)  ) Recent Results (from the past 240 hour(s))  Resp Panel by  RT-PCR (Flu A&B, Covid) Anterior Nasal Swab     Status: None   Collection Time: 05/18/22  2:57 PM   Specimen: Anterior Nasal Swab  Result Value Ref Range Status   SARS Coronavirus 2 by RT PCR NEGATIVE NEGATIVE Final    Comment: (NOTE) SARS-CoV-2 target nucleic acids are NOT DETECTED.  The SARS-CoV-2 RNA is generally detectable in upper respiratory specimens during the acute phase of infection. The lowest concentration of SARS-CoV-2 viral copies this assay can detect is 138 copies/mL. A negative result does not preclude SARS-Cov-2 infection and should not be used as the sole basis for treatment or other patient management decisions. A negative result may occur with  improper specimen collection/handling, submission of specimen other than nasopharyngeal swab, presence of viral mutation(s) within the areas targeted by this assay, and inadequate number of viral copies(<138 copies/mL). A negative result must be combined with clinical observations, patient history, and epidemiological information. The expected result is Negative.  Fact Sheet for Patients:  BloggerCourse.com  Fact Sheet for Healthcare Providers:  SeriousBroker.it  This test is no t yet approved  or cleared by the [place] FDA and  has been authorized for detection and/or diagnosis of SARS-CoV-2 by FDA under an Emergency Use Authorization (EUA). This EUA will remain  in effect (meaning this test can be used) for the duration of the COVID-19 declaration under Section 564(b)(1) of the Act, 21 U.S.C.section 360bbb-3(b)(1), unless the authorization is terminated  or revoked sooner.       Influenza A by PCR NEGATIVE NEGATIVE Final   Influenza B by PCR NEGATIVE NEGATIVE Final    Comment: (NOTE) The Xpert Xpress SARS-CoV-2/FLU/RSV plus assay is intended as an aid in the diagnosis of influenza from Nasopharyngeal swab specimens and should not be used as a sole basis for treatment. Nasal washings and aspirates are unacceptable for Xpert Xpress SARS-CoV-2/FLU/RSV testing.  Fact Sheet for Patients: BloggerCourse.com  Fact Sheet for Healthcare Providers: SeriousBroker.it  This test is not yet approved or cleared by the [place] FDA and has been authorized for detection and/or diagnosis of SARS-CoV-2 by FDA under an Emergency Use Authorization (EUA). This EUA will remain in effect (meaning this test can be used) for the duration of the COVID-19 declaration under Section 564(b)(1) of the Act, 21 U.S.C. section 360bbb-3(b)(1), unless the authorization is terminated or revoked.  Performed at [facility], [address], [place], [place] [address]      Radiology Studies: DG Chest 2 View  Result Date: 05/24/2022 CLINICAL DATA:  Shob/ RIGHT/LEFT HEART CATH AND CORONARY/GRAFT ANGIOGRAPHY 9/1/2023Evaluatepneumothorax.atelectasis. EXAM: CHEST - 2 VIEW COMPARISON:  05/22/2022.  CT, 05/18/2022. FINDINGS: No pneumothorax. Cardiac silhouette normal in size. Stable changes from the recent CABG surgery. No mediastinal widening. Mild basilar atelectasis with probable small effusions. Remainder of the lungs is clear. IMPRESSION: 1.  No acute findings. 2. No pneumothorax. 3. Mild basilar atelectasis with probable small effusions. Electronically Signed   By: Amie Portland M.D.   On: 05/24/2022 08:41     Scheduled Meds:  aspirin  81 mg Oral Daily   atorvastatin  80 mg Oral Daily   docusate sodium  100 mg Oral BID   empagliflozin  10 mg Oral Daily   feeding supplement  237 mL Oral BID BM   furosemide  20 mg Oral BID   insulin aspart  0-15 Units Subcutaneous TID WC   metoprolol succinate  100 mg Oral QHS   multivitamin with minerals  1 tablet Oral  Daily   sodium chloride flush  3 mL Intravenous Q12H   sodium chloride flush  3 mL Intravenous Q12H   sodium chloride flush  3 mL Intravenous Q12H   Continuous Infusions:  sodium chloride     heparin 1,700 Units/hr (05/25/22 0400)     LOS: 6 days    Time spent: 51min  Domenic Polite, MD [facility]   05/25/2022, 11:34 AM

## 2022-05-25 NOTE — Progress Notes (Signed)
ANTICOAGULATION CONSULT NOTE  Pharmacy Consult for Heparin Indication: New PE (CABG 04/2022 discharged on Eliquis for Afib)  No Known Allergies  Patient Measurements: Height: 6\' 1"  (185.4 cm) Weight: 91.6 kg (201 lb 14.4 oz) IBW/kg (Calculated) : 79.9 Heparin Dosing Weight: 91.6 kg  Vital Signs: Temp: 98.2 F (36.8 C) (09/03 1915) Temp Source: Oral (09/03 1915) BP: 126/64 (09/03 1915) Pulse Rate: 75 (09/03 1915)  Labs: Recent Labs    05/23/22 0420 05/23/22 1054 05/23/22 1123 05/24/22 0151 05/24/22 1104 05/25/22 0137 05/25/22 1100 05/25/22 1956  HGB  --    < > 11.9* 12.3*  --  12.0*  --   --   HCT  --    < > 35.0* 38.6*  --  36.8*  --   --   PLT  --   --   --  306  --  257  --   --   APTT 87*  --   --  56*   < > 62* 95* 100*  HEPARINUNFRC >1.10*  --   --  >1.10*  --  0.95*  --   --   CREATININE 1.58*  --   --  1.41*  --  1.42*  --   --    < > = values in this interval not displayed.     Estimated Creatinine Clearance: 46.1 mL/min (A) (by C-G formula based on SCr of 1.42 mg/dL (H)).   Medical History: Past Medical History:  Diagnosis Date   Arthritis    History of kidney stones    Hyperlipidemia    Hypertension    Nocturia    S/P CABG (coronary artery bypass graft)     Medications:  Medications Prior to Admission  Medication Sig Dispense Refill Last Dose   apixaban (ELIQUIS) 5 MG TABS tablet Take 1 tablet (5 mg total) by mouth 2 (two) times daily. 60 tablet 3 05/18/2022 at 7:00am   aspirin EC 81 MG tablet Take 81 mg by mouth daily.   05/18/2022   atorvastatin (LIPITOR) 40 MG tablet Take 1 tablet (40 mg total) by mouth daily. 30 tablet 3 05/18/2022   cyanocobalamin (VITAMIN B12) 1000 MCG tablet Take 1,000 mcg by mouth daily.   05/18/2022   diltiazem (TIAZAC) 240 MG 24 hr capsule Take 240 mg by mouth daily.   05/18/2022   [START ON 06/15/2022] fosinopril (MONOPRIL) 20 MG tablet Take 1 tablet (20 mg total) by mouth daily.   05/18/2022   [START ON 06/15/2022]  hydrochlorothiazide (HYDRODIURIL) 25 MG tablet Take 1 tablet (25 mg total) by mouth daily.   05/18/2022   isosorbide mononitrate (IMDUR) 30 MG 24 hr tablet Take 1 tablet (30 mg total) by mouth daily. 30 tablet 0 05/18/2022   metoprolol tartrate (LOPRESSOR) 25 MG tablet Take 1 tablet (25 mg total) by mouth 2 (two) times daily. 60 tablet 3 05/18/2022 at 7:00am   traMADol (ULTRAM) 50 MG tablet Take 1 tablet (50 mg total) by mouth every 4 (four) hours as needed for moderate pain. 30 tablet 0 Past Week    Assessment: 81 yo M presented with SOB, dry cough, fatigue, peripheral edema and hypoxia. Patient found to have new PE. Pt is s/p CABG on 8/17. Discharged on eliquis 5mg  BID for AF.  Apixaban held on admit and started on heparin. Last dose of apixaban on 8/30@0935 . -aPTT= 100 on heparin 1700 units/hr   Goal of Therapy:  Heparin level 0.3-0.7 units/ml aPTT 66-102 seconds Monitor platelets by anticoagulation protocol: Yes  Plan:  -Decrease heparin to 1650 units/hr to keep in goal -heparin level, aPTT and CBC in am  Harland German, PharmD Clinical Pharmacist **Pharmacist phone directory can now be found on [email or web address] ([email or web address]).  Listed under [facility].

## 2022-05-26 DIAGNOSIS — N1831 Chronic kidney disease, stage 3a: Secondary | ICD-10-CM

## 2022-05-26 DIAGNOSIS — I2699 Other pulmonary embolism without acute cor pulmonale: Secondary | ICD-10-CM | POA: Diagnosis not present

## 2022-05-26 DIAGNOSIS — I10 Essential (primary) hypertension: Secondary | ICD-10-CM | POA: Diagnosis not present

## 2022-05-26 DIAGNOSIS — I5022 Chronic systolic (congestive) heart failure: Secondary | ICD-10-CM | POA: Diagnosis not present

## 2022-05-26 DIAGNOSIS — E785 Hyperlipidemia, unspecified: Secondary | ICD-10-CM | POA: Diagnosis not present

## 2022-05-26 DIAGNOSIS — N189 Chronic kidney disease, unspecified: Secondary | ICD-10-CM | POA: Diagnosis present

## 2022-05-26 LAB — BASIC METABOLIC PANEL
Anion gap: 13 (ref 5–15)
BUN: 25 mg/dL — ABNORMAL HIGH (ref 8–23)
CO2: 24 mmol/L (ref 22–32)
Calcium: 9.1 mg/dL (ref 8.9–10.3)
Chloride: 105 mmol/L (ref 98–111)
Creatinine, Ser: 1.34 mg/dL — ABNORMAL HIGH (ref 0.61–1.24)
GFR, Estimated: 53 mL/min — ABNORMAL LOW (ref >60)
Glucose, Bld: 110 mg/dL — ABNORMAL HIGH (ref 70–99)
Potassium: 4.2 mmol/L (ref 3.5–5.1)
Sodium: 142 mmol/L (ref 135–145)

## 2022-05-26 LAB — APTT
aPTT: 113 seconds — ABNORMAL HIGH (ref 24–36)
aPTT: 83 seconds — ABNORMAL HIGH (ref 24–36)

## 2022-05-26 LAB — GLUCOSE, CAPILLARY
Glucose-Capillary: 114 mg/dL — ABNORMAL HIGH (ref 70–99)
Glucose-Capillary: 100 mg/dL — ABNORMAL HIGH (ref 70–99)
Glucose-Capillary: 91 mg/dL (ref 70–99)
Glucose-Capillary: 121 mg/dL — ABNORMAL HIGH (ref 70–99)

## 2022-05-26 LAB — HEPARIN LEVEL (UNFRACTIONATED)
Heparin Unfractionated: 0.71 IU/mL — ABNORMAL HIGH (ref 0.30–0.70)
Heparin Unfractionated: 0.57 IU/mL (ref 0.30–0.70)
Heparin Unfractionated: 0.64 IU/mL (ref 0.30–0.70)

## 2022-05-26 LAB — MAGNESIUM: Magnesium: 2.1 mg/dL (ref 1.7–2.4)

## 2022-05-26 MED ORDER — CLOPIDOGREL BISULFATE 75 MG PO TABS
300.0000 mg | ORAL_TABLET | Freq: Once | ORAL | Status: AC
Start: 2022-05-26 — End: 2022-05-26
  Administered 2022-05-26: 300 mg via ORAL
  Filled 2022-05-26: qty 4

## 2022-05-26 MED ORDER — CLOPIDOGREL BISULFATE 75 MG PO TABS
75.0000 mg | ORAL_TABLET | Freq: Every day | ORAL | Status: DC
  Administered 2022-05-27: 75 mg via ORAL
  Filled 2022-05-26: qty 1

## 2022-05-26 NOTE — Assessment & Plan Note (Signed)
Coronary artery disease.  09/01 Cardiac catheterization with occluded sequential bypass graft to diagonal, obtuse marginal and PL branch. Occluded saphenous vein graft to the PDA.  Patent LIMA to LAD.  Normal pulmonary pressures, mean PCWP 9 and normal right atrial pressure.  09/05 cardiac catheterization stent to distal RCA. Stent to proximal circumflex.  Patient will continue with aspirin and clopidogrel for one month and then discontinue aspirin Patient will continue taking apixaban for anticoagulation.  Continue high dose and high potency statin therapy.

## 2022-05-26 NOTE — Assessment & Plan Note (Signed)
Continue statin therapy.

## 2022-05-26 NOTE — Assessment & Plan Note (Signed)
Patient on heparin drip in preparation for cardiac catheterization No chest pain Oxygenation is 98% on room air.

## 2022-05-26 NOTE — Assessment & Plan Note (Addendum)
Patient with reduced LV systolic function Acute cardiogenic pulmonary edema with right pleural effusion Sp thoracentesis.   Urine output is no documented from last 24  Volume status is improving, but continue with lower extremity edema. Plan to continue with empafliflozin and metoprolol Furosemide transitioned to po 20 mg bid

## 2022-05-26 NOTE — Assessment & Plan Note (Signed)
Rate controlled atrial fibrillation  Plan to continue metoprolol for rate control and anticoagulation with heparin.  Continue with telemetry monitoring.

## 2022-05-26 NOTE — Progress Notes (Signed)
ANTICOAGULATION CONSULT NOTE  Pharmacy Consult for Heparin Indication: New PE (CABG 04/2022 discharged on Eliquis for Afib)  No Known Allergies  Patient Measurements: Height: 6\' 1"  (185.4 cm) Weight: 91.8 kg (202 lb 6.4 oz) IBW/kg (Calculated) : 79.9 Heparin Dosing Weight: 91.6 kg  Vital Signs: Temp: 98.4 F (36.9 C) (09/04 0745) Temp Source: Oral (09/04 0745) BP: 106/73 (09/04 1210) Pulse Rate: 65 (09/04 1210)  Labs: Recent Labs    05/24/22 0151 05/24/22 1104 05/25/22 0137 05/25/22 1100 05/25/22 1956 05/26/22 0132 05/26/22 1138  HGB 12.3*  --  12.0*  --   --   --   --   HCT 38.6*  --  36.8*  --   --   --   --   PLT 306  --  257  --   --   --   --   APTT 56*   < > 62*   < > 100* 113* 83*  HEPARINUNFRC >1.10*  --  0.95*  --   --  0.71* 0.57  CREATININE 1.41*  --  1.42*  --   --  1.34*  --    < > = values in this interval not displayed.     Estimated Creatinine Clearance: 48.9 mL/min (A) (by C-G formula based on SCr of 1.34 mg/dL (H)).   Medical History: Past Medical History:  Diagnosis Date   Arthritis    History of kidney stones    Hyperlipidemia    Hypertension    Nocturia    S/P CABG (coronary artery bypass graft)     Medications:  Medications Prior to Admission  Medication Sig Dispense Refill Last Dose   apixaban (ELIQUIS) 5 MG TABS tablet Take 1 tablet (5 mg total) by mouth 2 (two) times daily. 60 tablet 3 05/18/2022 at 7:00am   aspirin EC 81 MG tablet Take 81 mg by mouth daily.   05/18/2022   atorvastatin (LIPITOR) 40 MG tablet Take 1 tablet (40 mg total) by mouth daily. 30 tablet 3 05/18/2022   cyanocobalamin (VITAMIN B12) 1000 MCG tablet Take 1,000 mcg by mouth daily.   05/18/2022   diltiazem (TIAZAC) 240 MG 24 hr capsule Take 240 mg by mouth daily.   05/18/2022   [START ON 06/15/2022] fosinopril (MONOPRIL) 20 MG tablet Take 1 tablet (20 mg total) by mouth daily.   05/18/2022   [START ON 06/15/2022] hydrochlorothiazide (HYDRODIURIL) 25 MG tablet Take 1  tablet (25 mg total) by mouth daily.   05/18/2022   isosorbide mononitrate (IMDUR) 30 MG 24 hr tablet Take 1 tablet (30 mg total) by mouth daily. 30 tablet 0 05/18/2022   metoprolol tartrate (LOPRESSOR) 25 MG tablet Take 1 tablet (25 mg total) by mouth 2 (two) times daily. 60 tablet 3 05/18/2022 at 7:00am   traMADol (ULTRAM) 50 MG tablet Take 1 tablet (50 mg total) by mouth every 4 (four) hours as needed for moderate pain. 30 tablet 0 Past Week    Assessment: 81 yo M presented with SOB, dry cough, fatigue, peripheral edema and hypoxia. Patient found to have new PE. Pt is s/p CABG on 8/17. Discharged on eliquis 5mg  BID for AF.  Apixaban held on admit and started on heparin. Last dose of apixaban on 8/30@0935 . PCI scheduled 9/5 with plan for Eliquis post-PCI.   Heparin level today is therapeutic at 0.57 (aPTT 83), on 1550 units/hr. Heparin level and aPTT are now correlating--will discontinue aPTT labs. Hgb 12, plt 257 from yesterday--will order CBC for tomorrow morning. No line  issues or signs/symptoms of bleeding documented.  Goal of Therapy:  Heparin level 0.3-0.7 units/ml aPTT 66-102 seconds Monitor platelets by anticoagulation protocol: Yes   Plan:  -Continue heparin gtt at 1550 units/hr  -Discontinue aPTT labs  -Check heparin level in 8hrs  -Monitor heparin level, CBC, s/sx of bleeding daily    Cherylin Mylar, PharmD PGY1 Pharmacy Resident 9/4/202312:46 PM

## 2022-05-26 NOTE — Progress Notes (Signed)
ANTICOAGULATION CONSULT NOTE  Pharmacy Consult for Heparin Indication: New PE (CABG 04/2022 discharged on Eliquis for Afib)  No Known Allergies  Patient Measurements: Height: 6\' 1"  (185.4 cm) Weight: 91.6 kg (201 lb 14.4 oz) IBW/kg (Calculated) : 79.9 Heparin Dosing Weight: 91.6 kg  Vital Signs: Temp: 98.1 F (36.7 C) (09/03 2317) Temp Source: Oral (09/03 2317) BP: 108/70 (09/03 2317) Pulse Rate: 87 (09/03 2317)  Labs: Recent Labs    05/23/22 1123 05/24/22 0151 05/24/22 1104 05/25/22 0137 05/25/22 1100 05/25/22 1956 05/26/22 0132  HGB 11.9* 12.3*  --  12.0*  --   --   --   HCT 35.0* 38.6*  --  36.8*  --   --   --   PLT  --  306  --  257  --   --   --   APTT  --  56*   < > 62* 95* 100* 113*  HEPARINUNFRC  --  >1.10*  --  0.95*  --   --  0.71*  CREATININE  --  1.41*  --  1.42*  --   --  1.34*   < > = values in this interval not displayed.     Estimated Creatinine Clearance: 48.9 mL/min (A) (by C-G formula based on SCr of 1.34 mg/dL (H)).   Medical History: Past Medical History:  Diagnosis Date   Arthritis    History of kidney stones    Hyperlipidemia    Hypertension    Nocturia    S/P CABG (coronary artery bypass graft)     Medications:  Medications Prior to Admission  Medication Sig Dispense Refill Last Dose   apixaban (ELIQUIS) 5 MG TABS tablet Take 1 tablet (5 mg total) by mouth 2 (two) times daily. 60 tablet 3 05/18/2022 at 7:00am   aspirin EC 81 MG tablet Take 81 mg by mouth daily.   05/18/2022   atorvastatin (LIPITOR) 40 MG tablet Take 1 tablet (40 mg total) by mouth daily. 30 tablet 3 05/18/2022   cyanocobalamin (VITAMIN B12) 1000 MCG tablet Take 1,000 mcg by mouth daily.   05/18/2022   diltiazem (TIAZAC) 240 MG 24 hr capsule Take 240 mg by mouth daily.   05/18/2022   [START ON 06/15/2022] fosinopril (MONOPRIL) 20 MG tablet Take 1 tablet (20 mg total) by mouth daily.   05/18/2022   [START ON 06/15/2022] hydrochlorothiazide (HYDRODIURIL) 25 MG tablet Take 1  tablet (25 mg total) by mouth daily.   05/18/2022   isosorbide mononitrate (IMDUR) 30 MG 24 hr tablet Take 1 tablet (30 mg total) by mouth daily. 30 tablet 0 05/18/2022   metoprolol tartrate (LOPRESSOR) 25 MG tablet Take 1 tablet (25 mg total) by mouth 2 (two) times daily. 60 tablet 3 05/18/2022 at 7:00am   traMADol (ULTRAM) 50 MG tablet Take 1 tablet (50 mg total) by mouth every 4 (four) hours as needed for moderate pain. 30 tablet 0 Past Week    Assessment: 81 yo M presented with SOB, dry cough, fatigue, peripheral edema and hypoxia. Patient found to have new PE. Pt is s/p CABG on 8/17. Discharged on eliquis 5mg  BID for AF.  Apixaban held on admit and started on heparin. Last dose of apixaban on 8/30@0935 .  -aPTT= 113 and and heparin level 0.71 on heparin 1650 units/hr. No overt s/sx of bleeding reported. Will order CBC with next labs   Goal of Therapy:  Heparin level 0.3-0.7 units/ml aPTT 66-102 seconds Monitor platelets by anticoagulation protocol: Yes   Plan:  -Decrease  heparin gtt to 1550 units/hr  -heparin level, aPTT and CBC in am  Ruben Im, PharmD Clinical Pharmacist 05/26/2022 3:06 AM Please check AMION for all [facility] numbers

## 2022-05-26 NOTE — Progress Notes (Signed)
Rounding Note    Patient Name: [name] Goodman Date of Encounter: 05/26/2022  [place] Cardiologist: Early Osmond, MD   Subjective   No acute events, feels well and without dyspnea.  Inpatient Medications    Scheduled Meds:  aspirin  81 mg Oral Daily   atorvastatin  80 mg Oral Daily   docusate sodium  100 mg Oral BID   empagliflozin  10 mg Oral Daily   feeding supplement  237 mL Oral BID BM   furosemide  20 mg Oral BID   insulin aspart  0-15 Units Subcutaneous TID WC   metoprolol succinate  100 mg Oral QHS   multivitamin with minerals  1 tablet Oral Daily   sodium chloride flush  3 mL Intravenous Q12H   sodium chloride flush  3 mL Intravenous Q12H   sodium chloride flush  3 mL Intravenous Q12H   Continuous Infusions:  sodium chloride     heparin 1,550 Units/hr (05/26/22 0319)   PRN Meds: sodium chloride, acetaminophen, bisacodyl, hydrALAZINE, ipratropium-albuterol, ondansetron **OR** ondansetron (ZOFRAN) IV, ondansetron (ZOFRAN) IV, polyethylene glycol, sodium chloride flush, traMADol, traZODone   Vital Signs    Vitals:   05/25/22 1915 05/25/22 2317 05/26/22 0540 05/26/22 0745  BP: 126/64 108/70 126/71 135/84  Pulse: 75 87 68 73  Resp: 16 20 18 20   Temp: 98.2 F (36.8 C) 98.1 F (36.7 C) 98 F (36.7 C) 98.4 F (36.9 C)  TempSrc: Oral Oral Oral Oral  SpO2: 93% 100% 98% 100%  Weight:   91.8 kg   Height:        Intake/Output Summary (Last 24 hours) at 05/26/2022 0809 Last data filed at 05/26/2022 0745 Gross per 24 hour  Intake 469.14 ml  Output 550 ml  Net -80.86 ml      05/26/2022    5:40 AM 05/25/2022   11:00 AM 05/24/2022    5:00 AM  Last 3 Weights  Weight (lbs) 202 lb 6.4 oz 201 lb 14.4 oz 201 lb 14.4 oz  Weight (kg) 91.808 kg 91.581 kg 91.581 kg      Telemetry    Sinus rhythm with occasional PVCs, no changes- Personally Reviewed  ECG    No new- Personally Reviewed  Physical Exam   GEN: No acute distress.   Neck: No  JVD Cardiac: RRR, no murmurs, rubs, or gallops.  CABG scar, stable Respiratory: Clear to auscultation bilaterally. GI: Soft, nontender, non-distended  MS: No edema; No deformity. Neuro:  Nonfocal  Psych: Normal affect   Labs    High Sensitivity Troponin:   Recent Labs  Lab 05/18/22 1430 05/18/22 1722  TROPONINIHS 277* 255*     Chemistry Recent Labs  Lab 05/24/22 0151 05/25/22 0137 05/26/22 0132  NA 140 138 142  K 3.6 4.0 4.2  CL 103 105 105  CO2 25 25 24   GLUCOSE 118* 120* 110*  BUN 25* 25* 25*  CREATININE 1.41* 1.42* 1.34*  CALCIUM 9.2 9.0 9.1  MG 2.2 2.2 2.1  GFRNONAA 50* 50* 53*  ANIONGAP 12 8 13     Lipids No results for input(s): "CHOL", "TRIG", "HDL", "LABVLDL", "LDLCALC", "CHOLHDL" in the last 168 hours.  Hematology Recent Labs  Lab 05/21/22 0313 05/23/22 1054 05/23/22 1123 05/24/22 0151 05/25/22 0137  WBC 10.3  --   --  7.1 6.4  RBC 3.74*  --   --  4.17* 4.03*  HGB 11.1*   < > 11.9* 12.3* 12.0*  HCT 35.8*   < > 35.0*  38.6* 36.8*  MCV 95.7  --   --  92.6 91.3  MCH 29.7  --   --  29.5 29.8  MCHC 31.0  --   --  31.9 32.6  RDW 14.9  --   --  14.8 14.6  PLT 321  --   --  306 257   < > = values in this interval not displayed.   Thyroid No results for input(s): "TSH", "FREET4" in the last 168 hours.  BNP Recent Labs  Lab 05/21/22 0313  BNP 576.9*    DDimer No results for input(s): "DDIMER" in the last 168 hours.   Radiology    No results found.  Cardiac Studies   Cath this admission  Occluded SVG to RCA/PDA, and occluded SVG sequential to diagonal, ramus, and PL branch. LIMA is patent to mid LAD. Severe LV systolic dysfunction with EF 20 to 25%. Severe RCA disease and native LCA disease. There are targets for PCI. Probably better treated after on good HF therapy and kidney function okay. Normal hemodynamics and no evidence of pulmonary hypertension.   Medical therapy with guideline directed therapy for systolic heart failure Consider RCA  PCI when patient is further out from surgery and is stronger and less ischemic symptoms develop.  TTE 05/19/22 EF 25-30% with mildly reduced RV function; no significant valvular problmes   Patient Profile     81 y.o. male with ischemic cardiomyopathy EF 20 to 25% status post recent CABG with heart catheterization as above.  Assessment & Plan    Acute on chronic systolic heart failure secondary to ischemic cardiomyopathy status post CABG (LIMA to LAD, VG to RCA, VG to Dx and Y graft off Dx using left radial onto OM and PL)  -EF now 25%, due to early graft failure.  LIMA remains patent but VG to Dx and VG to RCA down.  Radial jump between OM and RPLV remains patent.   -Occluded SVG to RCA, occluded SVG sequential to diagonal ramus and PL branch. --Patient is euvolemic on exam --Continue with Jardiance 10, Toprol XL 100 mg, lasix 20mg  BID.  Cr improving, will hold losartan vs Entresto and spironolactone until after PCI. -Creatinine is trending downward (1.42).  Blood pressure fairly soft.  Could consider addition of low-dose Entresto post PCI if we continue to go in the right direction.   Paroxysmal atrial fibrillation - Currently maintaining sinus rhythm.  On IV heparin secondary to recent cardiac catheterization and planned PCI on Tuesday.  Plan 1 month of triple therapy then plavix and Eliquis  Chronic kidney disease stage IIIa - Improving, monitor for now.  Acute pulmonary embolism - Currently on IV heparin.  Will transition to Eliquis following PCI  CAD - Plan for iFR of Lcx/LM (with PCI) and PCI of RCA due to graft failure -Continue with aspirin statin and metoprolol. --Will load with plavix 300mg  now --Given obligate need for A/C given small PE, will plan on 1 month of triple therapy with ASA/plavix/Eliquis followed by Eliquis/plavix --NPO at MN for PCI   I have reviewed the risks, indications, and alternatives to cardiac catheterization, possible angioplasty, and stenting  with the patient. Risks include but are not limited to bleeding, infection, vascular injury, stroke, myocardial infection, arrhythmia, kidney injury, radiation-related injury in the case of prolonged fluoroscopy use, emergency cardiac surgery, and death. The patient understands the risks of serious complication is 1-2 in 1000 with diagnostic cardiac cath and 1-2% or less with angioplasty/stenting.    For questions or  updates, please contact [facility] Please consult [email or web address] for contact info under        Signed, Orbie Pyo, MD  05/26/2022, 8:09 AM

## 2022-05-26 NOTE — H&P (Signed)
Plan stent RCA and physiology guided therapy on left circumflex after RCA treated.

## 2022-05-26 NOTE — Progress Notes (Signed)
  Progress Note   Patient: [name] Goodman GGY:[id number] DOB: 11/18/1940 DOA: 05/18/2022     7 DOS: the patient was seen and examined on 05/26/2022   Brief hospital course: 81 y.o. male with medical history significant of HTN and HLD presenting with SOB.  He was last hospitalized by CT surgery form 8/17-24 for CABG x 5.  Postop also developed A-fib taking Eliquis.  Presented to the ED with dyspnea on exertion, CTA chest noted pulmonary embolism, bilateral pleural effusions -Status post left-sided thoracentesis with 1.6 L fluid removal on 8/28. -2D echo noted drop in EF down to 25% -Left heart cath noted occluded SVG to RCA/PDA and occluded SVG to diagonal, ramus and PL branch, LIMA to male mid LAD is patent, severe RCA disease and native LCA disease, severe LV dysfunction with EF of 20-25%  Assessment and Plan: * Chronic systolic CHF (congestive heart failure) (HCC) Patient with reduced LV systolic function Acute cardiogenic pulmonary edema with right pleural effusion Sp thoracentesis.   Urine output is no documented from last 24  Volume status is improving, but continue with lower extremity edema. Plan to continue with empafliflozin and metoprolol Furosemide transitioned to po 20 mg bid    Acute pulmonary embolism without acute cor pulmonale (HCC) Patient on heparin drip in preparation for cardiac catheterization No chest pain Oxygenation is 98% on room air.   Atrial fibrillation, chronic (HCC) Rate controlled atrial fibrillation  Plan to continue metoprolol for rate control and anticoagulation with heparin.  Continue with telemetry monitoring.   Dyslipidemia Continue statin therapy  Essential hypertension Continue blood pressure control with metoprolol.   S/P CABG x 5 Cardiac catheterization with failed grafts, except LIMA to LAD.  Plan for PCI in am to RCA and Cx.   Stage 3a chronic kidney disease (CKD) (HCC) Hypokalemia  Renal function with serum cr at 1.34 with K  at 4,2 and serum bicarbonate at 24 Plan to continue diuresis with furosemide and follow up renal function in am.        Subjective: Patient with no chest pain or dyspnea, lower extremity edema is improving. He is out of bed to chair   Physical Exam: Vitals:   05/25/22 2317 05/26/22 0540 05/26/22 0745 05/26/22 1210  BP: 108/70 126/71 135/84 106/73  Pulse: 87 68 73 65  Resp: 20 18 20 20   Temp: 98.1 F (36.7 C) 98 F (36.7 C) 98.4 F (36.9 C)   TempSrc: Oral Oral Oral   SpO2: 100% 98% 100% 98%  Weight:  91.8 kg    Height:       Neurology awake and alert ENT with mild pallor Cardiovascular with S1 and S2 present with no rubs or gallops Respiratory with no rales or wheezing Abdomen with  no distention  Positive lower extremity edema ++  Data Reviewed:    Family Communication: I spoke with patient's wife at the bedside, we talked in detail about patient's condition, plan of care and prognosis and all questions were addressed.   Disposition: Status is: Inpatient Remains inpatient appropriate because: pending cardiac work up   Planned Discharge Destination: Home     Author: , MD 05/26/2022 3:48 PM  For on call review [email or web address].

## 2022-05-26 NOTE — Progress Notes (Signed)
Mobility Specialist: Progress Note   05/26/22 1116  Mobility  Activity Ambulated independently in hallway  Level of Assistance Modified independent, requires aide device or extra time  Assistive Device Other (Comment) (IV pole)  Distance Ambulated (ft) 1200 ft  Activity Response Tolerated well  $Mobility charge 1 Mobility   Received pt in chair having no complaints and agreeable to mobility. Pt was asymptomatic throughout ambulation and returned to room w/o fault. Left in chair w/ call bell in reach and all needs met.  [facility] Juanjesus Pepperman Mobility Specialist Mobility Specialist [address]: [phone]

## 2022-05-26 NOTE — Progress Notes (Signed)
ANTICOAGULATION CONSULT NOTE  Pharmacy Consult for Heparin Indication: New PE (CABG 04/2022 discharged on Eliquis for Afib)  No Known Allergies  Patient Measurements: Height: 6\' 1"  (185.4 cm) Weight: 91.8 kg (202 lb 6.4 oz) IBW/kg (Calculated) : 79.9 Heparin Dosing Weight: 91.6 kg  Vital Signs: Temp: 98.3 F (36.8 C) (09/04 2021) Temp Source: Oral (09/04 2021) BP: 121/74 (09/04 2021) Pulse Rate: 75 (09/04 2021)  Labs: Recent Labs    05/24/22 0151 05/24/22 1104 05/25/22 0137 05/25/22 1100 05/25/22 1956 05/26/22 0132 05/26/22 1138 05/26/22 2036  HGB 12.3*  --  12.0*  --   --   --   --   --   HCT 38.6*  --  36.8*  --   --   --   --   --   PLT 306  --  257  --   --   --   --   --   APTT 56*   < > 62*   < > 100* 113* 83*  --   HEPARINUNFRC >1.10*  --  0.95*  --   --  0.71* 0.57 0.64  CREATININE 1.41*  --  1.42*  --   --  1.34*  --   --    < > = values in this interval not displayed.     Estimated Creatinine Clearance: 48.9 mL/min (A) (by C-G formula based on SCr of 1.34 mg/dL (H)).   Medical History: Past Medical History:  Diagnosis Date   Arthritis    History of kidney stones    Hyperlipidemia    Hypertension    Nocturia    S/P CABG (coronary artery bypass graft)     Medications:  Medications Prior to Admission  Medication Sig Dispense Refill Last Dose   apixaban (ELIQUIS) 5 MG TABS tablet Take 1 tablet (5 mg total) by mouth 2 (two) times daily. 60 tablet 3 05/18/2022 at 7:00am   aspirin EC 81 MG tablet Take 81 mg by mouth daily.   05/18/2022   atorvastatin (LIPITOR) 40 MG tablet Take 1 tablet (40 mg total) by mouth daily. 30 tablet 3 05/18/2022   cyanocobalamin (VITAMIN B12) 1000 MCG tablet Take 1,000 mcg by mouth daily.   05/18/2022   diltiazem (TIAZAC) 240 MG 24 hr capsule Take 240 mg by mouth daily.   05/18/2022   [START ON 06/15/2022] fosinopril (MONOPRIL) 20 MG tablet Take 1 tablet (20 mg total) by mouth daily.   05/18/2022   [START ON 06/15/2022]  hydrochlorothiazide (HYDRODIURIL) 25 MG tablet Take 1 tablet (25 mg total) by mouth daily.   05/18/2022   isosorbide mononitrate (IMDUR) 30 MG 24 hr tablet Take 1 tablet (30 mg total) by mouth daily. 30 tablet 0 05/18/2022   metoprolol tartrate (LOPRESSOR) 25 MG tablet Take 1 tablet (25 mg total) by mouth 2 (two) times daily. 60 tablet 3 05/18/2022 at 7:00am   traMADol (ULTRAM) 50 MG tablet Take 1 tablet (50 mg total) by mouth every 4 (four) hours as needed for moderate pain. 30 tablet 0 Past Week    Assessment: 81 yo M found to have new PE. Pt is s/p CABG on 05/08/22. Discharged on eliquis (apixaban) 5mg  BID for afib. Apixaban held on admit and started on heparin. Last dose of apixaban on 8/30@0935 . PCI scheduled 9/5 with plan for Eliquis post-PCI.   HL 0.62 - therapeutic . No issues with line and no s/sx of bleeding per RN.   Goal of Therapy:  Heparin level 0.3-0.7 units/ml Monitor platelets  by anticoagulation protocol: Yes   Plan:  Continue heparin gtt at 1550 units/hr  Check daily HL  Monitor heparin level, CBC, s/sx of bleeding    Calton Dach, PharmD Clinical Pharmacist 05/26/2022 9:21 PM

## 2022-05-26 NOTE — Assessment & Plan Note (Signed)
Continue blood pressure control with metoprolol.  

## 2022-05-26 NOTE — Hospital Course (Signed)
Mr. Godshall was admitted to the hospital with the working diagnosis of decompensated heart failure and acute pulmonary embolism.   81 y.o. male with medical history significant of HTN and HLD presenting with SOB.  He was last hospitalized by CT surgery form 8/17-24 for CABG x 5.  Postop also developed A-fib taking Eliquis.  Presented to the ED with dyspnea on exertion. At home patient developed, wheezing, lower extremity edema and worsening dyspnea, his 02 saturation was 80% and he decided to come to the ED for further evaluation. On his initial physical examination his blood pressure was 149/90, HR 98, RR 30 and 02 saturation 92% on supplemental 02 per Bishop. Lungs with no wheezing or rhonchi, increased work of breathing, heart wit S1 and S2 present and rhythmic, abdomen not distended and positive lower extremity edema.   Na 136, K 4,6 CL 103 bicarbonate 23 glucose 141 bub 21 cr 1,33 BNP 961 High sensitive troponin 277 and 255  Wbc 13,4 hgb 11,5 plt 326  Sars covid 19 negative  Urine analysis SG 1,020, 100 protein, 21-50 wbc   Chest radiograph with cardiomegaly, bilateral hilar vascular congestion, small left pleural effusion.  CT chest with bilateral ground glass opacities at the upper lobes more right than left, bilateral pleural effusions more left than right with compression atelectasis.  Segmental and subsegmental pulmonary embolus of the right middle lobe and right upper lobe.   -Status post left-sided thoracentesis with 1.6 L fluid removal on 8/28. -2D echo noted drop in EF down to 25%  Placed on furosemide for diuresis with good response.   -Left heart cath noted occluded SVG to RCA/PDA and occluded SVG to diagonal, ramus and PL branch, LIMA to male mid LAD is patent, severe RCA disease and native LCA disease, severe LV dysfunction with EF of 20-25%  Patient underwent distal RCA stent placement. Proximal to mid circumflex stented.

## 2022-05-26 NOTE — Assessment & Plan Note (Addendum)
Hypokalemia. CKD stage 3a  Patient tolerated well diuresis, at the time of his discharge his renal function has a serum cr of 1,23 with K at 4,2 and serum bicarbonate at 22. Plan to continue diuresis with furosemide and empagliflozin, Follow up renal function as outpatient.

## 2022-05-27 ENCOUNTER — Encounter (HOSPITAL_COMMUNITY)
Admission: EM | Disposition: A | Discharge status: Home Health | Payer: Self-pay | Source: Home / Self Care | Attending: Internal Medicine

## 2022-05-27 ENCOUNTER — Encounter (HOSPITAL_COMMUNITY): Payer: Self-pay | Admitting: Interventional Cardiology

## 2022-05-27 DIAGNOSIS — I2510 Atherosclerotic heart disease of native coronary artery without angina pectoris: Secondary | ICD-10-CM | POA: Diagnosis not present

## 2022-05-27 DIAGNOSIS — I5022 Chronic systolic (congestive) heart failure: Secondary | ICD-10-CM | POA: Diagnosis not present

## 2022-05-27 HISTORY — PX: CORONARY STENT INTERVENTION: CATH118234

## 2022-05-27 HISTORY — PX: INTRAVASCULAR PRESSURE WIRE/FFR STUDY: CATH118243

## 2022-05-27 LAB — GLUCOSE, CAPILLARY
Glucose-Capillary: 110 mg/dL — ABNORMAL HIGH (ref 70–99)
Glucose-Capillary: 170 mg/dL — ABNORMAL HIGH (ref 70–99)
Glucose-Capillary: 110 mg/dL — ABNORMAL HIGH (ref 70–99)

## 2022-05-27 LAB — BASIC METABOLIC PANEL
Anion gap: 9 (ref 5–15)
BUN: 21 mg/dL (ref 8–23)
CO2: 23 mmol/L (ref 22–32)
Calcium: 8.8 mg/dL — ABNORMAL LOW (ref 8.9–10.3)
Chloride: 108 mmol/L (ref 98–111)
Creatinine, Ser: 1.3 mg/dL — ABNORMAL HIGH (ref 0.61–1.24)
GFR, Estimated: 55 mL/min — ABNORMAL LOW (ref >60)
Glucose, Bld: 111 mg/dL — ABNORMAL HIGH (ref 70–99)
Potassium: 3.8 mmol/L (ref 3.5–5.1)
Sodium: 140 mmol/L (ref 135–145)

## 2022-05-27 LAB — CBC
HCT: 34.7 % — ABNORMAL LOW (ref 39.0–52.0)
Hemoglobin: 11.5 g/dL — ABNORMAL LOW (ref 13.0–17.0)
MCH: 30 pg (ref 26.0–34.0)
MCHC: 33.1 g/dL (ref 30.0–36.0)
MCV: 90.6 fL (ref 80.0–100.0)
Platelets: 234 10*3/uL (ref 150–400)
RBC: 3.83 MIL/uL — ABNORMAL LOW (ref 4.22–5.81)
RDW: 14.5 % (ref 11.5–15.5)
WBC: 6.2 10*3/uL (ref 4.0–10.5)
nRBC: 0 % (ref 0.0–0.2)

## 2022-05-27 LAB — PHOSPHORUS: Phosphorus: 3.9 mg/dL (ref 2.5–4.6)

## 2022-05-27 LAB — POCT ACTIVATED CLOTTING TIME
Activated Clotting Time: 293 seconds
Activated Clotting Time: 245 seconds
Activated Clotting Time: 257 seconds
Activated Clotting Time: 293 seconds
Activated Clotting Time: 299 seconds

## 2022-05-27 LAB — MAGNESIUM: Magnesium: 2.1 mg/dL (ref 1.7–2.4)

## 2022-05-27 LAB — HEPARIN LEVEL (UNFRACTIONATED): Heparin Unfractionated: 0.56 IU/mL (ref 0.30–0.70)

## 2022-05-27 SURGERY — CORONARY STENT INTERVENTION
Anesthesia: LOCAL

## 2022-05-27 MED ORDER — NITROGLYCERIN 1 MG/10 ML FOR IR/CATH LAB
INTRA_ARTERIAL | Status: DC | PRN
  Administered 2022-05-27: 100 ug via INTRACORONARY

## 2022-05-27 MED ORDER — PANTOPRAZOLE SODIUM 40 MG PO TBEC
40.0000 mg | DELAYED_RELEASE_TABLET | Freq: Every day | ORAL | Status: DC
  Administered 2022-05-27 – 2022-05-28 (×2): 40 mg via ORAL
  Filled 2022-05-27 (×2): qty 1

## 2022-05-27 MED ORDER — SODIUM CHLORIDE 0.9 % IV SOLN: INTRAVENOUS | Status: AC

## 2022-05-27 MED ORDER — ASPIRIN 81 MG PO CHEW
81.0000 mg | CHEWABLE_TABLET | ORAL | Status: AC
  Administered 2022-05-27: 81 mg via ORAL
  Filled 2022-05-27: qty 1

## 2022-05-27 MED ORDER — HEPARIN SODIUM (PORCINE) 1000 UNIT/ML IJ SOLN
INTRAMUSCULAR | Status: AC
  Filled 2022-05-27: qty 10

## 2022-05-27 MED ORDER — ACETAMINOPHEN 325 MG PO TABS: 650.0000 mg | ORAL_TABLET | ORAL | Status: DC | PRN

## 2022-05-27 MED ORDER — LIDOCAINE HCL (PF) 1 % IJ SOLN
INTRAMUSCULAR | Status: AC
  Filled 2022-05-27: qty 30

## 2022-05-27 MED ORDER — LABETALOL HCL 5 MG/ML IV SOLN: 10.0000 mg | INTRAVENOUS | Status: AC | PRN

## 2022-05-27 MED ORDER — MIDAZOLAM HCL 2 MG/2ML IJ SOLN
INTRAMUSCULAR | Status: DC | PRN
  Administered 2022-05-27: 1 mg via INTRAVENOUS
  Administered 2022-05-27: .5 mg via INTRAVENOUS

## 2022-05-27 MED ORDER — ASPIRIN 81 MG PO CHEW
81.0000 mg | CHEWABLE_TABLET | Freq: Every day | ORAL | Status: DC
  Administered 2022-05-28: 81 mg via ORAL
  Filled 2022-05-27: qty 1

## 2022-05-27 MED ORDER — LIDOCAINE HCL (PF) 1 % IJ SOLN
INTRAMUSCULAR | Status: DC | PRN
  Administered 2022-05-27: 2 mL

## 2022-05-27 MED ORDER — SODIUM CHLORIDE 0.9 % IV SOLN: 250.0000 mL | INTRAVENOUS | Status: DC | PRN

## 2022-05-27 MED ORDER — HEPARIN (PORCINE) IN NACL 1000-0.9 UT/500ML-% IV SOLN
INTRAVENOUS | Status: AC
  Filled 2022-05-27: qty 1000

## 2022-05-27 MED ORDER — SODIUM CHLORIDE 0.9% FLUSH
3.0000 mL | Freq: Two times a day (BID) | INTRAVENOUS | Status: DC
  Administered 2022-05-27 – 2022-05-28 (×3): 3 mL via INTRAVENOUS

## 2022-05-27 MED ORDER — IOHEXOL 350 MG/ML SOLN
INTRAVENOUS | Status: DC | PRN
  Administered 2022-05-27: 205 mL

## 2022-05-27 MED ORDER — MIDAZOLAM HCL 2 MG/2ML IJ SOLN
INTRAMUSCULAR | Status: AC
  Filled 2022-05-27: qty 2

## 2022-05-27 MED ORDER — VERAPAMIL HCL 2.5 MG/ML IV SOLN
INTRAVENOUS | Status: DC | PRN
  Administered 2022-05-27: 10 mL via INTRA_ARTERIAL

## 2022-05-27 MED ORDER — FENTANYL CITRATE (PF) 100 MCG/2ML IJ SOLN
INTRAMUSCULAR | Status: DC | PRN
  Administered 2022-05-27: 50 ug via INTRAVENOUS
  Administered 2022-05-27: 25 ug via INTRAVENOUS

## 2022-05-27 MED ORDER — SODIUM CHLORIDE 0.9% FLUSH: 3.0000 mL | INTRAVENOUS | Status: DC | PRN

## 2022-05-27 MED ORDER — SODIUM CHLORIDE 0.9% FLUSH: 3.0000 mL | Freq: Two times a day (BID) | INTRAVENOUS | Status: DC

## 2022-05-27 MED ORDER — FENTANYL CITRATE (PF) 100 MCG/2ML IJ SOLN
INTRAMUSCULAR | Status: AC
  Filled 2022-05-27: qty 2

## 2022-05-27 MED ORDER — POTASSIUM CHLORIDE CRYS ER 20 MEQ PO TBCR
40.0000 meq | EXTENDED_RELEASE_TABLET | Freq: Once | ORAL | Status: AC
Start: 2022-05-27 — End: 2022-05-27
  Administered 2022-05-27: 40 meq via ORAL
  Filled 2022-05-27: qty 2

## 2022-05-27 MED ORDER — ONDANSETRON HCL 4 MG/2ML IJ SOLN
4.0000 mg | Freq: Four times a day (QID) | INTRAMUSCULAR | Status: DC | PRN
Start: 2022-05-27 — End: 2022-05-27

## 2022-05-27 MED ORDER — CLOPIDOGREL BISULFATE 75 MG PO TABS
75.0000 mg | ORAL_TABLET | Freq: Every day | ORAL | Status: DC
  Administered 2022-05-28: 75 mg via ORAL
  Filled 2022-05-27: qty 1

## 2022-05-27 MED ORDER — HYDRALAZINE HCL 20 MG/ML IJ SOLN: 10.0000 mg | INTRAMUSCULAR | Status: DC | PRN

## 2022-05-27 MED ORDER — APIXABAN 5 MG PO TABS
5.0000 mg | ORAL_TABLET | Freq: Two times a day (BID) | ORAL | Status: DC
  Administered 2022-05-27 – 2022-05-28 (×2): 5 mg via ORAL
  Filled 2022-05-27 (×2): qty 1

## 2022-05-27 MED ORDER — HEPARIN (PORCINE) IN NACL 1000-0.9 UT/500ML-% IV SOLN
INTRAVENOUS | Status: DC | PRN
  Administered 2022-05-27 (×2): 500 mL

## 2022-05-27 MED ORDER — HYDRALAZINE HCL 20 MG/ML IJ SOLN: 10.0000 mg | INTRAMUSCULAR | Status: AC | PRN

## 2022-05-27 MED ORDER — VERAPAMIL HCL 2.5 MG/ML IV SOLN
INTRAVENOUS | Status: AC
  Filled 2022-05-27: qty 2

## 2022-05-27 MED ORDER — SODIUM CHLORIDE 0.9 % IV SOLN
INTRAVENOUS | Status: AC | PRN
  Administered 2022-05-27: 20 mL/h via INTRAVENOUS

## 2022-05-27 MED ORDER — HEPARIN SODIUM (PORCINE) 1000 UNIT/ML IJ SOLN
INTRAMUSCULAR | Status: DC | PRN
  Administered 2022-05-27: 9000 [IU] via INTRAVENOUS
  Administered 2022-05-27: 2500 [IU] via INTRAVENOUS
  Administered 2022-05-27: 3000 [IU] via INTRAVENOUS
  Administered 2022-05-27: 2500 [IU] via INTRAVENOUS
  Administered 2022-05-27: 2000 [IU] via INTRAVENOUS

## 2022-05-27 MED ORDER — SODIUM CHLORIDE 0.9 % IV SOLN: INTRAVENOUS | Status: DC

## 2022-05-27 MED ORDER — NITROGLYCERIN 1 MG/10 ML FOR IR/CATH LAB
INTRA_ARTERIAL | Status: AC
  Filled 2022-05-27: qty 10

## 2022-05-27 SURGICAL SUPPLY — 28 items
BALL SAPPHIRE NC24 3.0X26 (BALLOONS) ×1
BALL SAPPHIRE NC24 3.25X8 (BALLOONS) ×1
BALL SAPPHIRE NC24 3.5X12 (BALLOONS) ×1
BALLN SAPPHIRE 2.5X15 (BALLOONS) ×1
BALLN SCOREFLEX 2.50X10 (BALLOONS) ×1
BALLOON SAPPHIRE 2.5X15 (BALLOONS) IMPLANT
BALLOON SAPPHIRE NC24 3.0X26 (BALLOONS) IMPLANT
BALLOON SAPPHIRE NC24 3.25X8 (BALLOONS) IMPLANT
BALLOON SAPPHIRE NC24 3.5X12 (BALLOONS) IMPLANT
BALLOON SCOREFLEX 2.50X10 (BALLOONS) IMPLANT
BAND ZEPHYR COMPRESS 30 LONG (HEMOSTASIS) IMPLANT
CATH LAUNCHER 6FR JR4 (CATHETERS) IMPLANT
CATH VISTA GUIDE 6FR XB3.5 (CATHETERS) IMPLANT
DEVICE RAD COMP TR BAND LRG (VASCULAR PRODUCTS) IMPLANT
ELECT DEFIB PAD ADLT CADENCE (PAD) IMPLANT
GLIDESHEATH SLEND A-KIT 6F 22G (SHEATH) IMPLANT
GUIDEWIRE INQWIRE 1.5J.035X260 (WIRE) IMPLANT
GUIDEWIRE PRESSURE X 175 (WIRE) IMPLANT
INQWIRE 1.5J .035X260CM (WIRE) ×1
KIT ENCORE 26 ADVANTAGE (KITS) IMPLANT
KIT HEART LEFT (KITS) ×1 IMPLANT
PACK CARDIAC CATHETERIZATION (CUSTOM PROCEDURE TRAY) ×1 IMPLANT
SHEATH PROBE COVER 6X72 (BAG) IMPLANT
STENT ONYX FRONTIER 2.75X12 (Permanent Stent) IMPLANT
STENT ONYX FRONTIER 2.75X30 (Permanent Stent) IMPLANT
TRANSDUCER W/STOPCOCK (MISCELLANEOUS) ×1 IMPLANT
TUBING CIL FLEX 10 FLL-RA (TUBING) ×1 IMPLANT
WIRE ASAHI PROWATER 180CM (WIRE) IMPLANT

## 2022-05-27 NOTE — Progress Notes (Signed)
Heart Failure Stewardship Pharmacist Progress Note   PCP: [name], MD PCP-Cardiologist: Orbie Pyo, MD    HPI:  81 yo M with PMH of HTN, HLD, afib, and CAD s/p CABG on 8/17.  He was discharged on 8/23 but presented back to the ED on 8/27 with shortness of breath, fatigue, hypoxia, LE edema, and wheezing. CXR with small L pleural effusion. CTA with acute PE. Received thoracentesis on 8/28 removing 1.6L of fluid. Pre-CABG, his EF was 35-45%. An ECHO on 8/28 has shown drop in LVEF to 20-25% with mildly reduced RV. Cardiology has been consulted. Concern for graft failure. Confirmed on [facility] 9/1. Also with normal filling pressures. Planned PCI to distal RCA and left circumflex 9/5.   Current HF Medications: Diuretic: furosemide 20 mg PO BID Beta Blocker: metoprolol succinate 100 mg daily SGLT2i: Jardiance 10 mg daily  Prior to admission HF Medications: Beta blocker: metoprolol tartrate 25 mg BID ACE/ARB/ARNI: fosinopril 20 mg daily (has not restarted yet Other: Imdur 30 mg daily ** also on diltiazem PO and HCTZ (has not restarted yet)  Pertinent Lab Values: Serum creatinine 1.30, BUN 21, Potassium 3.8, Sodium 140, BNP 961.0, Magnesium 2.1, A1c 6.3   Vital Signs: Weight: 200 lbs (admission weight: 228 lbs) Blood pressure: 120-160/70s  Heart rate: 70s  I/O: -1.2L yesterday  Medication Assistance / Insurance Benefits Check: Does the patient have prescription insurance?  Yes Type of insurance plan: [employer]  Outpatient Pharmacy:  Prior to admission outpatient pharmacy: [facility] Is the patient willing to use [facility] TOC pharmacy at discharge? Yes Is the patient willing to transition their outpatient pharmacy to utilize a [facility] outpatient pharmacy?   Pending    Assessment: 1. Acute on chronic systolic CHF (LVEF 20-25%), due to ICM s/p CABG on 8/17. NYHA class II symptoms. - Continue furosemide 20 mg PO BID. Strict I/Os. Daily weights. K low - keep K>4  and Mag>2. - Continue metoprolol succinate 100 mg daily - Consider starting Entresto 24/26 mg BID tomorrow if renal function is stable - Consider starting spironolactone prior to discharge to optimize GDMT - Continue Jardiance 10 mg daily - Discontinued diltiazem, do not resume on discharge - Consider stopping PTA HCTZ on discharge. Patient has not restarted this medication yet post-CABG.    Plan: 1) Medication changes recommended at this time: - KCl 40 mEq x 1 - Start Entresto 24/26 mg BID tomorrow if renal function stable - Do not resume diltiazem on discharge - Stop PTA HCTZ on discharge  2) Patient assistance: - Entresto copay $40 Marcelline Deist copay $40 - Jardiance copay $40  3)  Education  - Patient has been educated on current HF medications and potential additions to HF medication regimen - Patient verbalizes understanding that over the next few months, these medication doses may change and more medications may be added to optimize HF regimen - Patient has been educated on basic disease state pathophysiology and goals of therapy   Sharen Hones, PharmD, BCPS Heart Failure Stewardship Pharmacist Phone [phone]

## 2022-05-27 NOTE — Progress Notes (Signed)
Progress Note   Patient: [name] Goodman [id number]:[id number] DOB: Feb 13, 1941 DOA: 05/18/2022     8 DOS: the patient was seen and examined on 05/27/2022   Brief hospital course: 81 y.o. male with medical history significant of HTN and HLD presenting with SOB.  He was last hospitalized by CT surgery form 8/17-24 for CABG x 5.  Postop also developed A-fib taking Eliquis.  Presented to the ED with dyspnea on exertion, CTA chest noted pulmonary embolism, bilateral pleural effusions -Status post left-sided thoracentesis with 1.6 L fluid removal on 8/28. -2D echo noted drop in EF down to 25% -Left heart cath noted occluded SVG to RCA/PDA and occluded SVG to diagonal, ramus and PL branch, LIMA to male mid LAD is patent, severe RCA disease and native LCA disease, severe LV dysfunction with EF of 20-25%  Assessment and Plan:  Frequent PVCs -Probably related to cardio cath -One dose of potassium ordered by cardiology and will send Mg level -TSH  Angina -S/p staged cath on Friday and today. Two stents were placed in today in RCA and LCX -Has no chest pain right now, hold off checking Trop   * Chronic systolic CHF (congestive heart failure) (HCC)  Presented with acute cardiogenic pulmonary edema with right pleural effusion Sp thoracentesis.   Off IV lasix, on po Lasix BID   Acute pulmonary embolism without acute cor pulmonale (HCC) Patient on heparin drip and starting Eliquis today Oxygenation is 98% on room air.  Patient on ASA, plavix and Eliquis, will add PPI  Atrial fibrillation, chronic (HCC) Rate controlled atrial fibrillation  Plan to continue metoprolol for rate control and anticoagulation with heparin.  Continue with telemetry monitoring.   Dyslipidemia Continue statin therapy  Essential hypertension Continue blood pressure control with metoprolol.   S/P CABG x 5 Cardiac catheterization with failed grafts, except LIMA to LAD.  Plan for PCI in am to RCA and Cx.   Stage 3a  chronic kidney disease (CKD) (HCC) Hypokalemia  Renal function with serum cr at 1.34 with K at 4,2 and serum bicarbonate at 24 Plan to continue diuresis with furosemide and follow up renal function in am.        Subjective:  Subjective:  Eyes: PERRL, lids and conjunctivae normal ENMT: Mucous membranes are moist. Posterior pharynx clear of any exudate or lesions.Normal dentition.  Neck: normal, supple, no masses, no thyromegaly Respiratory: clear to auscultation bilaterally, no wheezing, no crackles. Normal respiratory effort. No accessory muscle use.  Cardiovascular: Regular rate and rhythm, no murmurs / rubs / gallops. No extremity edema. 2+ pedal pulses. No carotid bruits.  Abdomen: no tenderness, no masses palpated. No hepatosplenomegaly. Bowel sounds positive.  Musculoskeletal: no clubbing / cyanosis. No joint deformity upper and lower extremities. Good ROM, no contractures. Normal muscle tone.  Skin: no rashes, lesions, ulcers. No induration Neurologic: CN 2-12 grossly intact. Sensation intact, DTR normal.  Slight weakness on left side compared to right side Psychiatric: Normal judgment and insight. Alert and oriented x 3. Normal mood.    Physical Exam: Vitals:   05/27/22 1240 05/27/22 1250 05/27/22 1318 05/27/22 1535  BP:  (!) 142/76 (!) 147/87 117/67  Pulse: 65 65 73 72  Resp: 11 12 (!) 21 17  Temp:   98 F (36.7 C) 98.6 F (37 C)  TempSrc:   Oral Oral  SpO2: 100% 99% 100% 96%  Weight:      Height:       Data Reviewed:  Cardiocath reviewed  Family Communication: wife  at bedside  Disposition: Status is: Inpatient Remains inpatient appropriate because: S/P cath, with Hx of CKD, plan to monitor kidney function overnight  Planned Discharge Destination: Send PT    Time spent: 38 minutes  Author: Emeline General, MD 05/27/2022 4:22 PM  For on call review [email or web address].

## 2022-05-27 NOTE — CV Procedure (Addendum)
Distal RCA stent 2.75 x 12 postdilated to 3.25 mm at high pressure.  RFR of proximal to mid diffuse disease 0.89 and 0.90 after treating the distal lesion and therefore was not stented. Circumflex territory was interrogated with RFR.  Beyond the mid vessel disease the RFR was 0.88.  Pullback to the proximal circumflex RFR 0.92.  Proximal to mid circumflex treated with 2.75 x 30 stent postdilated to 3.5 mmHg and high-pressure. Last ACT 293 seconds also treated with 2000 units of heparin.

## 2022-05-28 ENCOUNTER — Encounter (HOSPITAL_COMMUNITY): Payer: PPO

## 2022-05-28 ENCOUNTER — Other Ambulatory Visit (HOSPITAL_COMMUNITY): Payer: Self-pay

## 2022-05-28 DIAGNOSIS — N179 Acute kidney failure, unspecified: Secondary | ICD-10-CM | POA: Diagnosis not present

## 2022-05-28 DIAGNOSIS — I10 Essential (primary) hypertension: Secondary | ICD-10-CM | POA: Diagnosis not present

## 2022-05-28 DIAGNOSIS — E785 Hyperlipidemia, unspecified: Secondary | ICD-10-CM | POA: Diagnosis not present

## 2022-05-28 DIAGNOSIS — N189 Chronic kidney disease, unspecified: Secondary | ICD-10-CM

## 2022-05-28 DIAGNOSIS — I5022 Chronic systolic (congestive) heart failure: Secondary | ICD-10-CM | POA: Diagnosis not present

## 2022-05-28 DIAGNOSIS — Z955 Presence of coronary angioplasty implant and graft: Secondary | ICD-10-CM

## 2022-05-28 DIAGNOSIS — R778 Other specified abnormalities of plasma proteins: Secondary | ICD-10-CM

## 2022-05-28 DIAGNOSIS — I2699 Other pulmonary embolism without acute cor pulmonale: Secondary | ICD-10-CM | POA: Diagnosis not present

## 2022-05-28 LAB — CBC
HCT: 40.5 % (ref 39.0–52.0)
Hemoglobin: 12.9 g/dL — ABNORMAL LOW (ref 13.0–17.0)
MCH: 29.3 pg (ref 26.0–34.0)
MCHC: 31.9 g/dL (ref 30.0–36.0)
MCV: 92 fL (ref 80.0–100.0)
Platelets: 250 10*3/uL (ref 150–400)
RBC: 4.4 MIL/uL (ref 4.22–5.81)
RDW: 14.6 % (ref 11.5–15.5)
WBC: 5.9 10*3/uL (ref 4.0–10.5)
nRBC: 0 % (ref 0.0–0.2)

## 2022-05-28 LAB — BASIC METABOLIC PANEL
Anion gap: 10 (ref 5–15)
BUN: 17 mg/dL (ref 8–23)
CO2: 22 mmol/L (ref 22–32)
Calcium: 9.1 mg/dL (ref 8.9–10.3)
Chloride: 107 mmol/L (ref 98–111)
Creatinine, Ser: 1.23 mg/dL (ref 0.61–1.24)
GFR, Estimated: 59 mL/min — ABNORMAL LOW (ref >60)
Glucose, Bld: 110 mg/dL — ABNORMAL HIGH (ref 70–99)
Potassium: 4.2 mmol/L (ref 3.5–5.1)
Sodium: 139 mmol/L (ref 135–145)

## 2022-05-28 LAB — GLUCOSE, CAPILLARY: Glucose-Capillary: 116 mg/dL — ABNORMAL HIGH (ref 70–99)

## 2022-05-28 LAB — TSH: TSH: 3.21 u[IU]/mL (ref 0.350–4.500)

## 2022-05-28 MED ORDER — ADULT MULTIVITAMIN W/MINERALS CH
1.0000 | ORAL_TABLET | Freq: Every day | ORAL | 0 refills | Status: AC
  Filled 2022-05-28: qty 30, 30d supply, fill #0

## 2022-05-28 MED ORDER — METOPROLOL SUCCINATE ER 100 MG PO TB24
100.0000 mg | ORAL_TABLET | Freq: Every day | ORAL | Status: DC
  Administered 2022-05-28: 100 mg via ORAL
  Filled 2022-05-28: qty 1

## 2022-05-28 MED ORDER — LOSARTAN POTASSIUM 25 MG PO TABS
25.0000 mg | ORAL_TABLET | Freq: Every day | ORAL | Status: DC
  Administered 2022-05-28: 25 mg via ORAL
  Filled 2022-05-28: qty 1

## 2022-05-28 MED ORDER — ATORVASTATIN CALCIUM 80 MG PO TABS
80.0000 mg | ORAL_TABLET | Freq: Every day | ORAL | 0 refills | Status: DC
Start: 2022-05-29 — End: 2022-07-07
  Filled 2022-05-28: qty 30, 30d supply, fill #0

## 2022-05-28 MED ORDER — ASPIRIN 81 MG PO TBEC
81.0000 mg | DELAYED_RELEASE_TABLET | Freq: Every day | ORAL | 0 refills | Status: AC
  Filled 2022-05-28: qty 30, 30d supply, fill #0

## 2022-05-28 MED ORDER — METOPROLOL SUCCINATE ER 100 MG PO TB24
100.0000 mg | ORAL_TABLET | Freq: Every day | ORAL | 0 refills | Status: DC
  Filled 2022-05-28: qty 30, 30d supply, fill #0

## 2022-05-28 MED ORDER — EMPAGLIFLOZIN 10 MG PO TABS
10.0000 mg | ORAL_TABLET | Freq: Every day | ORAL | 0 refills | Status: DC
  Filled 2022-05-28: qty 30, 30d supply, fill #0

## 2022-05-28 MED ORDER — CLOPIDOGREL BISULFATE 75 MG PO TABS
75.0000 mg | ORAL_TABLET | Freq: Every day | ORAL | 0 refills | Status: AC
  Filled 2022-05-28: qty 30, 30d supply, fill #0

## 2022-05-28 MED ORDER — FUROSEMIDE 40 MG PO TABS: 40.0000 mg | ORAL_TABLET | Freq: Every day | ORAL | Status: DC

## 2022-05-28 MED ORDER — FUROSEMIDE 40 MG PO TABS
40.0000 mg | ORAL_TABLET | Freq: Every day | ORAL | 0 refills | Status: DC
Start: 2022-05-29 — End: 2022-07-07
  Filled 2022-05-28: qty 30, 30d supply, fill #0

## 2022-05-28 MED ORDER — ENSURE ENLIVE PO LIQD
237.0000 mL | Freq: Two times a day (BID) | ORAL | 0 refills | Status: DC
Start: 2022-05-28 — End: 2022-06-09
  Filled 2022-05-28: qty 14220, 30d supply, fill #0

## 2022-05-28 MED ORDER — LOSARTAN POTASSIUM 25 MG PO TABS
25.0000 mg | ORAL_TABLET | Freq: Every day | ORAL | 0 refills | Status: DC
  Filled 2022-05-28: qty 30, 30d supply, fill #0

## 2022-05-28 NOTE — Progress Notes (Signed)
CARDIAC REHAB PHASE I     Pt sitting up in bed ready for breakfast. He feels good today and reports that he has been able to move around out of bed this am without SOB or CP. Post sent teaching including site care, restrictions, heart healthy diet,risk factors, exercise guidelines, antiplatelet therapy importance, and CRP2 reviewed. Will refer to [facility] for CRP2. All questions and concerns addressed. Plan for home today.   [id number]  Woodroe Chen, RN BSN 05/28/2022 9:57 AM

## 2022-05-28 NOTE — Progress Notes (Addendum)
Rounding Note    Patient Name: [name] Goodman Date of Encounter: 05/28/2022  [facility] Cardiologist: Orbie Pyo, MD   Subjective   Doing well this morning.   Inpatient Medications    Scheduled Meds:  apixaban  5 mg Oral BID   aspirin  81 mg Oral Daily   atorvastatin  80 mg Oral Daily   clopidogrel  75 mg Oral Q breakfast   docusate sodium  100 mg Oral BID   empagliflozin  10 mg Oral Daily   feeding supplement  237 mL Oral BID BM   furosemide  20 mg Oral BID   insulin aspart  0-15 Units Subcutaneous TID WC   metoprolol succinate  100 mg Oral QHS   multivitamin with minerals  1 tablet Oral Daily   pantoprazole  40 mg Oral Daily   sodium chloride flush  3 mL Intravenous Q12H   sodium chloride flush  3 mL Intravenous Q12H   sodium chloride flush  3 mL Intravenous Q12H   sodium chloride flush  3 mL Intravenous Q12H   Continuous Infusions:  sodium chloride     sodium chloride     PRN Meds: sodium chloride, sodium chloride, acetaminophen, bisacodyl, hydrALAZINE, ipratropium-albuterol, ondansetron **OR** ondansetron (ZOFRAN) IV, polyethylene glycol, sodium chloride flush, sodium chloride flush, traMADol, traZODone   Vital Signs    Vitals:   05/27/22 1318 05/27/22 1535 05/27/22 2014 05/28/22 0613  BP: (!) 147/87 117/67 126/79 126/85  Pulse: 73 72 75 68  Resp: (!) 21 17 14 19   Temp: 98 F (36.7 C) 98.6 F (37 C) 98.3 F (36.8 C) 98 F (36.7 C)  TempSrc: Oral Oral Oral Oral  SpO2: 100% 96% 96% 96%  Weight:    89.4 kg  Height:        Intake/Output Summary (Last 24 hours) at 05/28/2022 0832 Last data filed at 05/28/2022 0500 Gross per 24 hour  Intake 220 ml  Output 1850 ml  Net -1630 ml      05/28/2022    6:13 AM 05/27/2022    4:05 AM 05/26/2022    5:40 AM  Last 3 Weights  Weight (lbs) 197 lb 200 lb 9.6 oz 202 lb 6.4 oz  Weight (kg) 89.359 kg 90.992 kg 91.808 kg      Telemetry    Sinus Rhythm - Personally Reviewed  ECG    Sinus  Rhythm, TWI in lateral leads - Personally Reviewed  Physical Exam   GEN: No acute distress.   Neck: No JVD Cardiac: RRR, no murmurs, rubs, or gallops.  Respiratory: Clear to auscultation bilaterally. GI: Soft, nontender, non-distended  MS: No edema; No deformity. Right radial cath site stable. Neuro:  Nonfocal  Psych: Normal affect   Labs    High Sensitivity Troponin:   Recent Labs  Lab 05/18/22 1430 05/18/22 1722  TROPONINIHS 277* 255*     Chemistry Recent Labs  Lab 05/25/22 0137 05/26/22 0132 05/27/22 0225 05/28/22 0626  NA 138 142 140 139  K 4.0 4.2 3.8 4.2  CL 105 105 108 107  CO2 25 24 23 22   GLUCOSE 120* 110* 111* 110*  BUN 25* 25* 21 17  CREATININE 1.42* 1.34* 1.30* 1.23  CALCIUM 9.0 9.1 8.8* 9.1  MG 2.2 2.1 2.1  --   GFRNONAA 50* 53* 55* 59*  ANIONGAP 8 13 9 10     Lipids No results for input(s): "CHOL", "TRIG", "HDL", "LABVLDL", "LDLCALC", "CHOLHDL" in the last 168 hours.  Hematology Recent Labs  Lab 05/25/22 0137 05/27/22 0225 05/28/22 0626  WBC 6.4 6.2 5.9  RBC 4.03* 3.83* 4.40  HGB 12.0* 11.5* 12.9*  HCT 36.8* 34.7* 40.5  MCV 91.3 90.6 92.0  MCH 29.8 30.0 29.3  MCHC 32.6 33.1 31.9  RDW 14.6 14.5 14.6  PLT 257 234 250   Thyroid  Recent Labs  Lab 05/28/22 0628  TSH 3.210    BNPNo results for input(s): "BNP", "PROBNP" in the last 168 hours.  DDimer No results for input(s): "DDIMER" in the last 168 hours.   Radiology    CARDIAC CATHETERIZATION  Result Date: 05/27/2022 CONCLUSIONS: Stent 85% distal RCA reducing stenosis to 0% with TIMI grade III flow.  RFR of proximal to mid disease in RCA was not hemodynamically significant (0.91). Stent proximal circumflex into large first obtuse marginal reducing 80 and 60% stenoses to 0% with TIMI grade III flow.  Circumflex and left main proximal to the implanted stent was interrogated with RFR post stent and was not hemodynamically significant at 0.9. RECOMMENDATIONS: Aspirin, Plavix, and Eliquis for 1  month then drop aspirin. Further management per treating team.    Cardiac Studies   Cath: 05/23/22  CONCLUSIONS: Occluded sequential bypass graft to diagonal, obtuse marginal, and PL branch. Occluded saphenous vein graft to the PDA. Patent LIMA to mid LAD. Patent left main with 50% distal disease.   Subtotal occlusion of the mid LAD with patent diagonals 1 through 3.. Patent large first obtuse marginal and small ramus intermedius.  The proximal to mid circumflex contains 50 to 70% stenosis. Native right coronary is patent with moderate diffuse disease proximal to distal.  Focal eccentric 60 to 70% stenosis before the origin of the PDA and left ventricular branches appearing somewhat improved compared to pre-CABG. Global hypokinesis with EF 25%.  LVEDP normal. Normal pulmonary artery pressures.  Mean pulmonary capillary wedge pressure 9 mmHg.  Normal right atrial pressure.   RECOMMENDATIONS:   Guideline directed therapy for systolic heart failure Consider percutaneous revascularization of distal RCA and circumflex obtuse marginal if angina or other clinical symptoms  Diagnostic Dominance: Right   Cath: 05/27/22  CONCLUSIONS: Stent 85% distal RCA reducing stenosis to 0% with TIMI grade III flow.  RFR of proximal to mid disease in RCA was not hemodynamically significant (0.91). Stent proximal circumflex into large first obtuse marginal reducing 80 and 60% stenoses to 0% with TIMI grade III flow.  Circumflex and left main proximal to the implanted stent was interrogated with RFR post stent and was not hemodynamically significant at 0.9.   RECOMMENDATIONS:   Aspirin, Plavix, and Eliquis for 1 month then drop aspirin. Further management per treating team.  Diagnostic Dominance: Right  Intervention    Patient Profile     81 y.o. male  with a hx of CABG on 05/08/2022, HTN, HLD, atrial fibrillation who is being seen 05/21/2022 for the evaluation of CHF at the request of Dr.  Broadus John.  Assessment & Plan    Acute on Chronic HFrEF  Bilateral Pleural Effusions  Ischemic Cardiomyopathy  -- Intraoperative TEE on 8/17 during CABG showed EF 40-45%. Echo this admission with EF 25-30%, abnormal septal motion consistent with post-operative status, mildly reduced RV systolic function  -- CTA chest on presentation showed mild pulmonary edema, moderate right and large left pleural effusions, segmental and subsegmental pulmonary embolus of the right middle lobe and right upper lobe  -- s/p left thoracentesis on 8/27, yielded 1.6 L fluid  -- diuresed with IV lasix, now on 20mg  PO  BID, net - 10L -- GDMT: continue jardiance, Toprol XL, add losartan 25mg  daily, switch to lasix 40mg  daily     CAD  s/p 5v CABG 05/08/2022 -- Patient underwent CABG x5 on 05/08/2022 with LIMA-LAD, SVG-posterior descending, SVG-first diagonal, sequential left radial artery graft to obtuse marginal and posterolateral -- Underwent cardiac cath on 9/1 noted above with patent LIMA-LAD, radial jump graft to OM and RPLV patent but SVG to Dx and SVG to RCA occluded.  -- underwent cardiac cath 9/5 noted above with PCI/DES to 85% distal RCA, PCI/DES to proximal circumflex into first OM with recommendations for triple therapy with aspirin, Plavix and Eliquis for 1 month then stopping aspirin with continuation of Plavix and Eliquis.   Paroxsymal Atrial Fibrillation  -- Cardizem prior to admission-- stopped with newly reduced EF -- continue Toprol XL 75mg  daily  -- Initially transitioned to eliquis post thoracentesis, now held with plan for cardiac cath -- continue IV heparin   HTN  -- home HCTZ and diltiazem has been stopped  -- continue Toprol XL 100 mg daily, add losartan 25mg  daily   CKD stage III -- Scr with slight increase 1.43>>1.38>>1.57, now improved to 1.23 -- follow BMET  Acute PE -- Treated with IV heparin but now transition to Eliquis post cath  Will arrange for outpatient follow up in the  office.  For questions or updates, please contact [facility] Please consult [email or web address] for contact info under        Signed, 11/1, NP  05/28/2022, 8:32 AM    I have examined the patient and reviewed assessment and plan and discussed with patient.  Agree with above as stated.    Right radial site stable.  No hematoma.  2+ right radial pulse.  Creatinine improved.  Losartan to be started today.  If renal function stays stable, could consider transitioning this to [facility].  Restart Eliquis 5 mg twice daily for pulmonary embolism.  He will stop aspirin after 1 month.  Continue clopidogrel and Eliquis after that time.  Watch for any bleeding signs.  Plan for discharge later today.  I discussed the case with Dr. .  Laverda Page

## 2022-05-28 NOTE — Progress Notes (Signed)
1 Day Post-Op Procedure(s) (LRB): CORONARY STENT INTERVENTION (N/A) INTRAVASCULAR PRESSURE WIRE/FFR STUDY (N/A) Subjective: Feels well this AM  Objective: Vital signs in last 24 hours: Temp:  [98 F (36.7 C)-98.6 F (37 C)] 98 F (36.7 C) (09/06 5797) Pulse Rate:  [62-75] 68 (09/06 0613) Cardiac Rhythm: Normal sinus rhythm (09/06 0849) Resp:  [10-21] 19 (09/06 0613) BP: (117-161)/(65-87) 126/85 (09/06 0613) SpO2:  [96 %-100 %] 96 % (09/06 0613) Weight:  [89.4 kg] 89.4 kg (09/06 0613)  Hemodynamic parameters for last 24 hours:    Intake/Output from previous day: 09/05 0701 - 09/06 0700 In: 220 [P.O.:220] Out: 2200 [Urine:2200] Intake/Output this shift: No intake/output data recorded.  General appearance: alert, cooperative, and no distress Neurologic: intact Heart: regular rate and rhythm Extremities: no edemia Wound: clean and dry  Lab Results: Recent Labs    05/27/22 0225 05/28/22 0626  WBC 6.2 5.9  HGB 11.5* 12.9*  HCT 34.7* 40.5  PLT 234 250   BMET:  Recent Labs    05/27/22 0225 05/28/22 0626  NA 140 139  K 3.8 4.2  CL 108 107  CO2 23 22  GLUCOSE 111* 110*  BUN 21 17  CREATININE 1.30* 1.23  CALCIUM 8.8* 9.1    PT/INR: No results for input(s): "LABPROT", "INR" in the last 72 hours. ABG    Component Value Date/Time   PHART 7.431 05/23/2022 1123   HCO3 28.2 (H) 05/23/2022 1123   TCO2 30 05/23/2022 1123   ACIDBASEDEF 2.0 05/09/2022 0009   O2SAT 96 05/23/2022 1123   CBG (last 3)  Recent Labs    05/27/22 1534 05/27/22 2129 05/28/22 0752  GLUCAP 170* 110* 116*    Assessment/Plan: S/P Procedure(s) (LRB): CORONARY STENT INTERVENTION (N/A) INTRAVASCULAR PRESSURE WIRE/FFR STUDY (N/A) - Looks great today Successful PCI yesterday for early vein graft failure For dc home today He has a follow up appointment with me already scheduled   LOS: 9 days    Loreli Slot 05/28/2022

## 2022-05-28 NOTE — Care Management Important Message (Signed)
Important Message  Patient Details  Name: [name] Goodman MRN: [id number] Date of Birth: 11-Dec-1940   Medicare Important Message Given:  Yes     Renie Ora 05/28/2022, 10:04 AM

## 2022-05-28 NOTE — TOC Initial Note (Signed)
Transition of Care [facility]) - Initial/Assessment Note    Patient Details  Name: [name] Goodman MRN: [id number] Date of Birth: 22-Aug-1941  Transition of Care [facility]) CM/SW Contact:    Gala Lewandowsky, RN Phone Number: 05/28/2022, 10:45 AM  Clinical Narrative:  Patient presented for shortness of breath. PTA patient was from home with support of family. Patient is currently active with [facility]- plan will be to return home with the agency. Case Manager did call the Liaison for [facility] to make them aware of discharge. No further home needs identified at this time.              Expected Discharge Plan: Home w Home Health Services Barriers to Discharge: No Barriers Identified   Patient Goals and CMS Choice Patient states their goals for this hospitalization and ongoing recovery are:: to return home with home health      Expected Discharge Plan and Services Expected Discharge Plan: Home w Home Health Services In-house Referral: NA Discharge Planning Services: CM Consult Post Acute Care Choice: Home Health, Resumption of Svcs/PTA Provider Living arrangements for the past 2 months: Single Family Home Expected Discharge Date: 05/28/22                 DME Agency: NA       HH Arranged: RN, PT, OT HH Agency: [facility] Date [facility] Agency Contacted: 05/28/22 Time HH Agency Contacted: 1045 Representative spoke with at [facility] Agency: Misty Stanley  Prior Living Arrangements/Services Living arrangements for the past 2 months: Single Family Home Lives with:: Significant Other Patient language and need for interpreter reviewed:: Yes Do you feel safe going back to the place where you live?: Yes      Need for Family Participation in Patient Care: Yes (Comment) Care giver support system in place?: Yes (comment) Current home services: DME Criminal Activity/Legal Involvement Pertinent to Current Situation/Hospitalization: No - Comment as needed  Permission  Sought/Granted Permission sought to share information with : Family Supports, Magazine features editor, Case Manager Permission granted to share information with : Yes, Verbal Permission Granted     Permission granted to share info w AGENCY: [facility]        Emotional Assessment Appearance:: Appears stated age       Alcohol / Substance Use: Not Applicable Psych Involvement: No (comment)  Admission diagnosis:  Shortness of breath [R06.02] Pleural effusion [J90] Hypoxia [R09.02] Elevated troponin [R77.8] Elevated brain natriuretic peptide (BNP) level [R79.89] Acute pulmonary embolism without acute cor pulmonale (HCC) [I26.99] Acute pulmonary embolism, unspecified pulmonary embolism type, unspecified whether acute cor pulmonale present (HCC) [I26.99] Acute respiratory distress [R06.03] Patient Active Problem List   Diagnosis Date Noted   Acute kidney injury superimposed on chronic kidney disease (HCC) 05/26/2022   Acute pulmonary embolism without acute cor pulmonale (HCC) 05/18/2022   Atrial fibrillation, chronic (HCC) 05/18/2022   Essential hypertension 05/18/2022   Dyslipidemia 05/18/2022   Chronic systolic CHF (congestive heart failure) (HCC) 05/18/2022   S/P CABG x 5 05/08/2022   Coronary artery disease involving native coronary artery of native heart 05/01/2022   S/P TKR (total knee replacement) using cement, right 06/11/2021   Nephrolithiasis 11/24/2017   BPH without obstruction/lower urinary tract symptoms 11/24/2017   Sinus bradycardia 02/22/2017   OA (osteoarthritis) of knee 07/23/2015   PCP:  [name], MD Pharmacy:   [facility] [address] [place], [place] - [address] [place] [address] Phone: [phone] Fax: [phone]  [facility] [address] [facility] [place], [place] - [address] [facility] [address] [address] [place] [address] Phone: [phone] Fax: [phone]  [facility] [address] [place] [address] Phone: [phone] Fax: [phone]   Social Determinants of Health (SDOH) Interventions Food Insecurity Interventions: Intervention Not Indicated Housing Interventions: Intervention Not Indicated Transportation Interventions: Intervention Not Indicated Financial Strain Interventions: Intervention Not Indicated  Readmission Risk Interventions    05/14/2022    1:05 PM  Readmission Risk Prevention Plan  Post Dischage Appt Not Complete  Appt Comments Per surgeon  Medication Screening Complete  Transportation Screening Complete

## 2022-05-28 NOTE — Progress Notes (Signed)
Completed the patient's medication details. Called TOC pharmacy about medication readiness. According to them the meds won't be ready for an hour. Spoke with the nurse the plan to take to the discharge lounge to await TOC meds. According to RN patient doesn't want to go to the lounge. Wants to stay in the room.

## 2022-05-28 NOTE — Progress Notes (Signed)
Mobility Specialist Criteria Algorithm Info.   05/28/22 1100  Mobility  Activity Ambulated independently in room  Range of Motion/Exercises Active;All extremities  Level of Assistance Independent  Assistive Device None  RUE Weight Bearing NWB  LUE Weight Bearing NWB  Distance Ambulated (ft) 25 ft  Activity Response Tolerated well   Patient received ambulating in room independently. To discharge soon so deferred hallway ambulation Was left dangling with all needs met, call bell in reach.   [place] Tou[place], [place], [place]  MOQHU:[phone] Office: [phone]

## 2022-05-28 NOTE — Discharge Summary (Addendum)
Physician Discharge Summary   Patient: [name] Goodman MRN: [id number] DOB: [birth year]  Admit date:     05/18/2022  Discharge date: 05/28/22  Discharge Physician: Tawni Millers   PCP: Maryland Pink, MD   Recommendations at discharge:    Patient had PCI stent placement to distal RCA and proxima circumflex. Plan to continue with aspirin and clopidogrel for one month, then discontinue aspirin and continue clopidogrel alone. Continue anticoagulation with apixaban Added losartan and empagliflozin Increased dose of metoprolol For now holding isosorbide to prevent hypotension. Follow up with Dr Kary Kos and cardiology as outpatient.   I spoke with patient's wife at the bedside, we talked in detail about patient's condition, plan of care and prognosis and all questions were addressed.   Discharge Diagnoses: Principal Problem:   Chronic systolic CHF (congestive heart failure) (HCC) Active Problems:   Acute pulmonary embolism without acute cor pulmonale (HCC)   Atrial fibrillation, chronic (HCC)   Dyslipidemia   Essential hypertension   S/P CABG x 5   Acute kidney injury superimposed on chronic kidney disease ([place])  Resolved Problems:   * No resolved hospital problems. [facility] Course: [name] Goodman was admitted to the hospital with the working diagnosis of decompensated heart failure and acute pulmonary embolism.   81 y.o. male with medical history significant of HTN and HLD presenting with SOB.  He was last hospitalized by CT surgery form 8/17-24 for CABG x 5.  Postop also developed A-fib taking Eliquis.  Presented to the ED with dyspnea on exertion. At home patient developed, wheezing, lower extremity edema and worsening dyspnea, his 02 saturation was 80% and he decided to come to the ED for further evaluation. On his initial physical examination his blood pressure was 149/90, HR 98, RR 30 and 02 saturation 92% on supplemental 02 per [place]. Lungs with no wheezing or  rhonchi, increased work of breathing, heart wit S1 and S2 present and rhythmic, abdomen not distended and positive lower extremity edema.   Na 136, K 4,6 CL 103 bicarbonate 23 glucose 141 bub 21 cr 1,33 BNP 961 High sensitive troponin 277 and 255  Wbc 13,4 hgb 11,5 plt 326  Sars covid 19 negative  Urine analysis SG 1,020, 100 protein, 21-50 wbc   Chest radiograph with cardiomegaly, bilateral hilar vascular congestion, small left pleural effusion.  CT chest with bilateral ground glass opacities at the upper lobes more right than left, bilateral pleural effusions more left than right with compression atelectasis.  Segmental and subsegmental pulmonary embolus of the right middle lobe and right upper lobe.   -Status post left-sided thoracentesis with 1.6 L fluid removal on 8/28. -2D echo noted drop in EF down to 25%  Placed on furosemide for diuresis with good response.   -Left heart cath noted occluded SVG to RCA/PDA and occluded SVG to diagonal, ramus and PL branch, LIMA to male mid LAD is patent, severe RCA disease and native LCA disease, severe LV dysfunction with EF of 20-25%  Patient underwent distal RCA stent placement. Proximal to mid circumflex stented.   Assessment and Plan: * Chronic systolic CHF (congestive heart failure) (HCC) Echocardiogram with reduced LV systolic function with EF 25 to 30%, mild reduction in RV systolic function.  No significant valvular disease.   Patient was placed on furosemide for diuresis, negative fluid balance was achieved, - 10.681 ml, with significant improvement in his symptoms.   Patient will continue medical therapy with empagliflozin, metoprolol and losartan. Continue diuresis with furosemide 40 mg  daily.   Acute hypoxemic respiratory failure due to cardiogenic pulmonary edema with bilateral pleural effusions.  Patient had thoracentesis on 1,6 L were removed with good toleration (blood tinged fluid).  At the time of his discharge his 02  saturations is 96% on room air.    Acute pulmonary embolism without acute cor pulmonale (HCC) Patient on heparin drip in preparation for cardiac catheterization No chest pain At the time of his discharge he will continue anticoagulation with apixaban.   Atrial fibrillation, chronic (HCC) Rate controlled atrial fibrillation  Plan to continue metoprolol for rate control and anticoagulation with apixaban.    Dyslipidemia Continue statin therapy  Essential hypertension Continue blood pressure control with metoprolol and losartan Continue diuresis with empagliflozin and furosemide. At the time of his discharge his systolic blood pressure has been in the 120's  S/P CABG x 5 Coronary artery disease.  09/01 Cardiac catheterization with occluded sequential bypass graft to diagonal, obtuse marginal and PL branch. Occluded saphenous vein graft to the PDA.  Patent LIMA to LAD.  Normal pulmonary pressures, mean PCWP 9 and normal right atrial pressure.  09/05 cardiac catheterization stent to distal RCA. Stent to proximal circumflex.  Patient will continue with aspirin and clopidogrel for one month and then discontinue aspirin Patient will continue taking apixaban for anticoagulation.  Continue high dose and high potency statin therapy.     Acute kidney injury superimposed on chronic kidney disease (HCC) Hypokalemia. CKD stage 3a  Patient tolerated well diuresis, at the time of his discharge his renal function has a serum cr of 1,23 with K at 4,2 and serum bicarbonate at 22. Plan to continue diuresis with furosemide and empagliflozin, Follow up renal function as outpatient.          Consultants: cardiology, CT surgery, IR  Procedures performed: thoracentesis, and cardiac catheterization   Disposition: Home Diet recommendation:  Discharge Diet Orders (From admission, onward)     Start     Ordered   05/28/22 0000  Diet - low sodium heart healthy        05/28/22 1031            Cardiac diet DISCHARGE MEDICATION: Allergies as of 05/28/2022   No Known Allergies      Medication List     STOP taking these medications    diltiazem 240 MG 24 hr capsule Commonly known as: TIAZAC   fosinopril 20 MG tablet Commonly known as: MONOPRIL   hydrochlorothiazide 25 MG tablet Commonly known as: HYDRODIURIL   isosorbide mononitrate 30 MG 24 hr tablet Commonly known as: IMDUR   metoprolol tartrate 25 MG tablet Commonly known as: LOPRESSOR       TAKE these medications    apixaban 5 MG Tabs tablet Commonly known as: ELIQUIS Take 1 tablet (5 mg total) by mouth 2 (two) times daily.   aspirin EC 81 MG tablet Take 1 tablet (81 mg total) by mouth daily. Last dose on 06/27/22. What changed: additional instructions   atorvastatin 80 MG tablet Commonly known as: LIPITOR Take 1 tablet (80 mg total) by mouth daily. Start taking on: May 29, 2022 What changed:  medication strength how much to take   clopidogrel 75 MG tablet Commonly known as: PLAVIX Take 1 tablet (75 mg total) by mouth daily with breakfast. Start taking on: May 29, 2022   cyanocobalamin 1000 MCG tablet Commonly known as: VITAMIN B12 Take 1,000 mcg by mouth daily.   empagliflozin 10 MG Tabs tablet Commonly known as: JARDIANCE  Take 1 tablet (10 mg total) by mouth daily. Start taking on: May 29, 2022   feeding supplement Liqd Take 237 mLs by mouth 2 (two) times daily between meals.   furosemide 40 MG tablet Commonly known as: LASIX Take 1 tablet (40 mg total) by mouth daily. Start taking on: May 29, 2022   losartan 25 MG tablet Commonly known as: COZAAR Take 1 tablet (25 mg total) by mouth daily.   metoprolol succinate 100 MG 24 hr tablet Commonly known as: TOPROL-XL Take 1 tablet (100 mg total) by mouth daily. Take with or immediately following a meal.   multivitamin with minerals Tabs tablet Take 1 tablet by mouth daily. Start taking on: May 29, 2022   traMADol 50 MG tablet Commonly known as: ULTRAM Take 1 tablet (50 mg total) by mouth every 4 (four) hours as needed for moderate pain.        Follow-up Information     [facility] Follow up on 05/29/2022.   Specialty: Cardiothoracic Surgery Why: Appointment is at 3:00, please get CXR at 2:30 at [facility] located on first floor of our office building Contact information: [place], [place] Follow up.   Specialty: Cardiology Why: Wednesday, Sept 6 @ noon for [facility] clinic within [place].  [address], off [address]. at [facility].  Parking garage code: [id number]. Contact information: [address] [id number] [place]. Follow up.   Why: Latricia Heft)- services to resume under Vascular office protocol Contact information: [place] [address] [phone]                Discharge Exam: Filed Weights   05/26/22 0540 05/27/22 0405 05/28/22 [id number]  Weight: 91.8 kg 91 kg 89.4 kg   BP 126/85 (BP Location: Left Arm)   Pulse 68   Temp 98 F (36.7 C) (Oral)   Resp 19   Ht 6\' 1"  (1.854 m)   Wt 89.4 kg   SpO2 96%   BMI 25.99 kg/m   Patient is feeling well with no dyspnea or chest pain  Neurology awake and alert ENT with no pallor Cardiovascular with S1 and S2 present and rhythmic with no gallops, rubs or murmurs No JVD No lower extremity edema Respiratory with mild rales at bases Abdomen with no distention   Condition at discharge: stable  The results of significant diagnostics from this hospitalization (including imaging, microbiology, ancillary and laboratory) are listed below for reference.   Imaging Studies: CARDIAC CATHETERIZATION  Result  Date: 05/27/2022 CONCLUSIONS: Stent 85% distal RCA reducing stenosis to 0% with TIMI grade III flow.  RFR of proximal to mid disease in RCA was not hemodynamically significant (0.91). Stent proximal circumflex into large first obtuse marginal reducing 80 and 60% stenoses to 0% with TIMI grade III flow.  Circumflex and left main proximal to the implanted stent was interrogated with RFR post stent and was not hemodynamically significant at 0.9. RECOMMENDATIONS: Aspirin, Plavix, and Eliquis for 1 month then drop aspirin. Further management per treating team.   DG Chest 2 View  Result Date: 05/24/2022 CLINICAL DATA:  [place]/ RIGHT/LEFT HEART CATH AND CORONARY/GRAFT ANGIOGRAPHY 9/1/2023Evaluatepneumothorax.atelectasis. EXAM: CHEST - 2 VIEW COMPARISON:  05/22/2022.  CT,  05/18/2022. FINDINGS: No pneumothorax. Cardiac silhouette normal in size. Stable changes from the recent CABG surgery. No mediastinal widening. Mild basilar atelectasis with probable small effusions. Remainder of the lungs is clear. IMPRESSION: 1. No acute findings. 2. No pneumothorax. 3. Mild basilar atelectasis with probable small effusions. Electronically Signed   By: Lajean Manes M.D.   On: 05/24/2022 08:41   CARDIAC CATHETERIZATION  Result Date: 05/23/2022 CONCLUSIONS: Occluded sequential bypass graft to diagonal, obtuse marginal, and PL branch. Occluded saphenous vein graft to the PDA. Patent LIMA to mid LAD. Patent left main with 50% distal disease.  Subtotal occlusion of the mid LAD with patent diagonals 1 through 3.. Patent large first obtuse marginal and small ramus intermedius.  The proximal to mid circumflex contains 50 to 70% stenosis. Native right coronary is patent with moderate diffuse disease proximal to distal.  Focal eccentric 60 to 70% stenosis before the origin of the PDA and left ventricular branches appearing somewhat improved compared to pre-CABG. Global hypokinesis with EF 25%.  LVEDP normal. Normal pulmonary artery pressures.   Mean pulmonary capillary wedge pressure 9 mmHg.  Normal right atrial pressure. RECOMMENDATIONS: Guideline directed therapy for systolic heart failure Consider percutaneous revascularization of distal RCA and circumflex obtuse marginal if angina or other clinical symptoms   DG Chest 2 View  Result Date: 05/22/2022 CLINICAL DATA:  [id number]; status post thoracentesis EXAM: CHEST - 2 VIEW COMPARISON:  Study from yesterday FINDINGS: Again seen are sternotomy wires and moderate cardiomegaly. There has been interval left thoracentesis. No pneumothorax. There is some atelectasis at the left lung base. Right lung remains clear. IMPRESSION: There has been interval thoracentesis. No pneumothorax. Cardiomegaly. Left basilar atelectasis. Electronically Signed   By: Frazier Richards M.D.   On: 05/22/2022 07:59   DG Chest 1 View  Result Date: 05/21/2022 CLINICAL DATA:  Shortness of breath. EXAM: CHEST  1 VIEW COMPARISON:  05/20/2022. FINDINGS: Heart is enlarged the mediastinal contour stable. Sternotomy wires are present over the midline. There are small bilateral pleural effusions with atelectasis at the lung bases. No pneumothorax. No acute osseous abnormality. IMPRESSION: 1. Small bilateral pleural effusions with atelectasis at the lung bases, unchanged. 2. Cardiomegaly. Electronically Signed   By: Brett Fairy M.D.   On: 05/21/2022 00:46   DG Chest Port 1 View  Result Date: 05/20/2022 CLINICAL DATA:  Dyspnea, status post thoracentesis on 05/19/2022 EXAM: PORTABLE CHEST 1 VIEW COMPARISON:  Radiographs 05/19/2022 FINDINGS: Since 05/19/2022, slightly increased small left pleural effusion and associated atelectasis. Trace right pleural effusion is similar. No pneumothorax. Cardiomegaly. Sternotomy and CABG. No acute osseous abnormality. IMPRESSION: Slightly increased small left pleural effusion and associated atelectasis since 05/11/2022. Cardiomegaly. Electronically Signed   By: Placido Sou M.D.   On: 05/20/2022 08:09    IR THORACENTESIS ASP PLEURAL SPACE W/IMG GUIDE  Result Date: 05/19/2022 INDICATION: Patient with a history of CABG x5 May 08, 2022. Patient presented to the ED 05/18/2022 with complaints of shortness of breath and fatigue with decreased O2 saturations. He was found to have PE and pleural effusions. Interventional radiology asked to perform a therapeutic thoracentesis. EXAM: ULTRASOUND GUIDED THORACENTESIS MEDICATIONS: 1% lidocaine 10 mL COMPLICATIONS: None immediate. PROCEDURE: An ultrasound guided thoracentesis was thoroughly discussed with the patient and questions answered. The benefits, risks, alternatives and complications were also discussed. The patient understands and wishes to proceed with the procedure. Written consent was obtained. Ultrasound was performed to localize and mark an adequate pocket of fluid in the left chest. The area was  then prepped and draped in the normal sterile fashion. 1% Lidocaine was used for local anesthesia. Under ultrasound guidance a 6 Fr Safe-T-Centesis catheter was introduced. Thoracentesis was performed. The catheter was removed and a dressing applied. FINDINGS: A total of approximately 1.6 L of blood-tinged fluid was removed. IMPRESSION: Successful ultrasound guided left thoracentesis yielding 1.6 L of pleural fluid. Read by: Soyla Dryer, NP Electronically Signed   By: Lucrezia Europe M.D.   On: 05/19/2022 12:37   ECHOCARDIOGRAM LIMITED  Result Date: 05/19/2022    ECHOCARDIOGRAM LIMITED REPORT   Patient Name:   [name] Goodman Date of Exam: 05/19/2022 Medical Rec #:  [id number]            Height:       73.0 in Accession #:    [id number]           Weight:       228.6 lb Date of Birth:  Jan 28, 1941            BSA:          2.277 m Patient Age:    8 years             BP:           139/106 mmHg Patient Gender: M                    HR:           90 bpm. Exam Location:  Inpatient Procedure: Limited Echo, Cardiac Doppler and Limited Color Doppler Indications:    PE   History:        Patient has prior history of Echocardiogram examinations, most                 recent 05/08/2022. CHF, CAD, Prior CABG, TIA, Arrythmias:Atrial                 Fibrillation and Bradycardia; Risk Factors:Hypertension,                 Non-Smoker and Dyslipidemia.  Sonographer:    Greer Pickerel Referring Phys: [id number] JENNIFER YATES  Sonographer Comments: CABG Inscision. IMPRESSIONS  1. Left ventricular ejection fraction, by estimation, is 25 to 30%. The left ventricle has severely decreased function.  2. Right ventricular systolic function is mildly reduced. The right ventricular size is normal.  3. The mitral valve is grossly normal. Comparison(s): Compared to TEE on 05/08/22, the LVEF appears to have dropped to 25-30% from 40-45%. FINDINGS  Left Ventricle: Left ventricular ejection fraction, by estimation, is 25 to 30%. The left ventricle has severely decreased function. The left ventricular internal cavity size was normal in size. There is no left ventricular hypertrophy. Abnormal (paradoxical) septal motion consistent with post-operative status. Right Ventricle: The right ventricular size is normal. Right ventricular systolic function is mildly reduced. Mitral Valve: The mitral valve is grossly normal. Tricuspid Valve: The tricuspid valve is normal in structure. Tricuspid valve regurgitation is trivial. LEFT VENTRICLE PLAX 2D LVIDd:         5.20 cm LVIDs:         4.90 cm LV PW:         1.10 cm LV IVS:        1.00 cm  RIGHT VENTRICLE RV Basal diam:  4.30 cm RV Mid diam:    2.80 cm RV S prime:     8.81 cm/s TAPSE (M-mode): 0.5 cm LEFT ATRIUM         Index  RIGHT ATRIUM           Index LA diam:    4.00 cm 1.76 cm/m  RA Area:     23.30 cm                                 RA Volume:   68.10 ml  29.90 ml/m Laurance Flatten MD Electronically signed by Laurance Flatten MD Signature Date/Time: 05/19/2022/12:26:44 PM    Final    DG Chest 1 View  Result Date: 05/19/2022 CLINICAL DATA:  Status post left  thoracentesis. EXAM: CHEST  1 VIEW COMPARISON:  May 18, 2022. FINDINGS: There is no definite pneumothorax status post left thoracentesis. Left pleural effusion is smaller. IMPRESSION: No definite pneumothorax status post left thoracentesis. Electronically Signed   By: Lupita Raider M.D.   On: 05/19/2022 09:32   CT Angio Chest PE W and/or Wo Contrast  Result Date: 05/18/2022 CLINICAL DATA:  Shortness of breath EXAM: CT ANGIOGRAPHY CHEST WITH CONTRAST TECHNIQUE: Multidetector CT imaging of the chest was performed using the standard protocol during bolus administration of intravenous contrast. Multiplanar CT image reconstructions and MIPs were obtained to evaluate the vascular anatomy. RADIATION DOSE REDUCTION: This exam was performed according to the departmental dose-optimization program which includes automated exposure control, adjustment of the mA and/or kV according to patient size and/or use of iterative reconstruction technique. CONTRAST:  59mL OMNIPAQUE IOHEXOL 350 MG/ML SOLN COMPARISON:  None Available. FINDINGS: Cardiovascular: Segmental and subsegmental pulmonary embolus is seen in the right middle lobe and right upper lobe. Cardiomegaly with no evidence of right heart strain, RV/LV ratio of 0.9. Trace pericardial effusion. Severe left main and three-vessel coronary artery calcifications status post CABG. Normal caliber thoracic aorta with mild calcified plaque. Mediastinum/Nodes: Esophagus and thyroid are unremarkable. Mild fat stranding of the anterior mediastinum. No pathologically enlarged lymph nodes seen in the chest. Lungs/Pleura: Central airways are patent. Bibasilar atelectasis. Mild ground-glass opacities which are most pronounced in the upper lungs. Moderate right and large left pleural effusions. Near complete collapse of the left lower lobe. Upper Abdomen: Partially visualized simple appearing left renal cysts, further follow-up imaging is recommended Musculoskeletal: Prior median  sternotomy with intact sternal wires. No acute osseous abnormality. Review of the MIP images confirms the above findings. IMPRESSION: 1. Segmental and subsegmental pulmonary embolus of the right middle lobe and right upper lobe. 2. Mild fat stranding of the anterior mediastinum, likely postsurgical changes related to recent CABG. No organized fluid collection 3. Mild pulmonary edema. 4. Moderate right and large left pleural effusions with near complete collapse of the left lower lobe. 5. Cardiomegaly and aortic Atherosclerosis (ICD10-I70.0). Critical Value/emergent results were called by telephone at the time of interpretation on 05/18/2022 at 5:08 pm to provider [facility] , who verbally acknowledged these results. Electronically Signed   By: Allegra Lai M.D.   On: 05/18/2022 17:11   DG Chest Port 1 View  Result Date: 05/18/2022 CLINICAL DATA:  Shortness of breath, chest pain EXAM: PORTABLE CHEST 1 VIEW COMPARISON:  Portable exam 1529 hours compared to 05/13/2022 FINDINGS: Enlargement of cardiac silhouette post CABG. Mediastinal contours and pulmonary vascularity normal. Persistent atelectasis versus consolidation LEFT lower lobe. Small LEFT pleural effusion. Persistent accentuation of perihilar interstitial markings without acute infiltrate or pneumothorax. Bones demineralized. IMPRESSION: Enlargement of cardiac silhouette post CABG. Persistent LEFT lower lobe atelectasis versus consolidation with small associated LEFT pleural effusion. Electronically Signed  By: Lavonia Dana M.D.   On: 05/18/2022 15:44   DG Chest 2 View  Result Date: 05/13/2022 CLINICAL DATA:  Postop CABG EXAM: CHEST - 2 VIEW COMPARISON:  05/10/2022 FINDINGS: Right jugular sheath removed.  No pneumothorax. Cardiac enlargement with prior CABG changes. Negative for heart failure Left lower lobe opacity with slight progression. Small left effusion. IMPRESSION: Left lower lobe airspace disease and left effusion with slight  progression. Negative for edema or pneumothorax. Electronically Signed   By: Franchot Gallo M.D.   On: 05/13/2022 13:54   DG Chest Port 1 View  Result Date: 05/10/2022 CLINICAL DATA:  Status post CABG. EXAM: PORTABLE CHEST 1 VIEW COMPARISON:  05/09/2022 FINDINGS: Status post median sternotomy and CABG procedure. Right IJ Cordis noted with tip projecting over the SVC. Interval removal of pulmonary arterial catheter, mediastinal drain and left chest tube. No pneumothorax visualized. Small bilateral pleural effusions are noted. There is atelectasis within the left lung base. No interstitial edema. IMPRESSION: 1. No pneumothorax status post chest tube removal. 2. Small bilateral pleural effusions.  No interstitial edema. 3. Left base atelectasis. Electronically Signed   By: Kerby Moors M.D.   On: 05/10/2022 08:40   ECHO INTRAOPERATIVE TEE  Result Date: 05/09/2022  *INTRAOPERATIVE TRANSESOPHAGEAL REPORT *  Patient Name:   HORICE BORSETH Date of Exam: 05/08/2022 Medical Rec #:  [id number]            Height:       73.0 in Accession #:    [id number]           Weight:       211.9 lb Date of Birth:  10/02/1940            BSA:          2.21 m Patient Age:    69 years             BP:           169/95 mmHg Patient Gender: M                    HR:           65 bpm. Exam Location:  Anesthesiology Transesophogeal exam was perform intraoperatively during surgical procedure. Patient was closely monitored under general anesthesia during the entirety of examination. Indications:     Coronary Artery Disease Sonographer:     Bernadene Person RDCS Performing Phys: [place] Diagnosing Phys: Suzette Battiest MD Complications: No known complications during this procedure. POST-OP IMPRESSIONS _ Left Ventricle: The left ventricle is unchanged from pre-bypass. _ Right Ventricle: The right ventricle appears unchanged from pre-bypass. _ Aorta: The aorta appears unchanged from pre-bypass. _ Left Atrium: The left  atrium appears unchanged from pre-bypass. _ Left Atrial Appendage: The left atrial appendage appears unchanged from pre-bypass. _ Aortic Valve: The aortic valve appears unchanged from pre-bypass. _ Mitral Valve: The mitral valve appears unchanged from pre-bypass. _ Tricuspid Valve: The tricuspid valve appears unchanged from pre-bypass. _ Pulmonic Valve: The pulmonic valve appears unchanged from pre-bypass. _ Interatrial Septum: The interatrial septum appears unchanged from pre-bypass. _ Interventricular Septum: The interventricular septum appears unchanged from pre-bypass. _ Pericardium: The pericardium appears unchanged from pre-bypass. PRE-OP FINDINGS  Left Ventricle: The left ventricle has mild-moderately reduced systolic function, with an ejection fraction of 40-45%. The cavity size was normal. Left ventricular diffuse hypokinesis. There is no left ventricular hypertrophy. Right Ventricle: The right ventricle has normal systolic function. The cavity was  normal. There is no increase in right ventricular wall thickness. Left Atrium: Left atrial size was normal in size. No left atrial/left atrial appendage thrombus was detected. Right Atrium: Right atrial size was normal in size. Interatrial Septum: No atrial level shunt detected by color flow Doppler. Pericardium: There is no evidence of pericardial effusion. Mitral Valve: The mitral valve is normal in structure. Mitral valve regurgitation is mild by color flow Doppler. There is mild thickening present. Tricuspid Valve: The tricuspid valve was normal in structure. Tricuspid valve regurgitation was not visualized by color flow Doppler. Aortic Valve: The aortic valve is tricuspid Aortic valve regurgitation is trivial by color flow Doppler. There is no stenosis of the aortic valve. There is mild thickening present with normal mobility. Pulmonic Valve: The pulmonic valve was normal in structure. Pulmonic valve regurgitation is trivial by color flow Doppler. Aorta: The  ascending aorta, aortic root and aortic arch are normal in size and structure. There is evidence of plaque in the aortic arch.  Suzette Battiest MD Electronically signed by Suzette Battiest MD Signature Date/Time: 05/09/2022/9:32:02 AM    Final    DG Chest Port 1 View  Result Date: 05/09/2022 CLINICAL DATA:  Status post CABG EXAM: PORTABLE CHEST 1 VIEW COMPARISON:  Chest x-ray dated May 08, 2022 FINDINGS: Interval extubation and removal of enteric tube. Right IJ approach PA catheter, mediastinal drain, and left-sided chest tube are unchanged in position. Stable cardiac and mediastinal contours post median sternotomy and CABG. Bibasilar atelectasis. No new airspace opacity. Possible trace right apical pneumothorax. IMPRESSION: 1. Possible trace right apical pneumothorax, findings could also be artifactual. Recommend short-term follow-up for further evaluation. 2. Interval extubation and removal of enteric tube. 3. No new airspace opacity. These results will be called to the ordering clinician or representative by the Radiologist Assistant, and communication documented in the PACS or [employer]. Electronically Signed   By: Yetta Glassman M.D.   On: 05/09/2022 08:27   DG Chest Port 1 View  Result Date: 05/08/2022 CLINICAL DATA:  Status post CABG EXAM: PORTABLE CHEST 1 VIEW COMPARISON:  Two-view chest of 1 day prior FINDINGS: Support apparatus: Endotracheal tube 3.4 cm above carina. Nasogastric tube terminates at the body of the stomach. Right IJ Swan-Ganz catheter tip at pulmonary outflow tract. Left-sided chest tube and mediastinal drain. Heart/mediastinum: Midline trachea. Normal heart size for level of inspiration. Pleura: No pleural effusion or pneumothorax. Lungs: Low lung volumes with resultant pulmonary interstitial prominence. Suspect mild concurrent pulmonary venous congestion. Left-greater-than-right base airspace disease. Other: None IMPRESSION: Expected appearance after median  sternotomy/CABG. Appropriate position of support apparatus, without pneumothorax. Low lung volumes with suspicion of mild pulmonary venous congestion and bibasilar atelectasis. Electronically Signed   By: Abigail Miyamoto M.D.   On: 05/08/2022 15:41   DG Chest 2 View  Result Date: 05/08/2022 CLINICAL DATA:  This study identified as missing a report at 5:57 am on 05/08/2022. 81 year old male preoperative study.  Planned heart surgery. EXAM: CHEST - 2 VIEW COMPARISON:  Portable chest 09/25/2013. FINDINGS: Lung volumes and mediastinal contours are within normal limits, stable since 2015. Visualized tracheal air column is within normal limits. Lungs appear stable and essentially clear; no pneumothorax, pulmonary edema, pleural effusion or confluent pulmonary opacity. No acute osseous abnormality identified. Negative visible bowel gas pattern. IMPRESSION: Negative for age, no acute cardiopulmonary abnormality. Electronically Signed   By: Genevie Ann M.D.   On: 05/08/2022 05:59   VAS US DOPPLER PRE CABG  Result Date: 05/07/2022 PREOPERATIVE VASCULAR  EVALUATION Patient Name:  KASHUS BETHELL  Date of Exam:   05/07/2022 Medical Rec #: [id number]             Accession #:    [id number] Date of Birth: June 01, 1941             Patient Gender: M Patient Age:   29 years Exam Location:  [facility] Procedure:      VAS US DOPPLER PRE CABG Referring Phys: Remo Lipps HENDRICKSON --------------------------------------------------------------------------------  Indications:      Pre-CABG. Risk Factors:     Hypertension, hyperlipidemia, past history of smoking. Comparison Study: No prior study Performing Technologist: Maudry Mayhew MHA, RVT, RDCS, RDMS  Examination Guidelines: A complete evaluation includes B-mode imaging, spectral Doppler, color Doppler, and power Doppler as needed of all accessible portions of each vessel. Bilateral testing is considered an integral part of a complete examination. Limited examinations for  reoccurring indications may be performed as noted.  Right Carotid Findings: +----------+--------+--------+--------+-----------------------+--------+           PSV cm/sEDV cm/sStenosisDescribe               Comments +----------+--------+--------+--------+-----------------------+--------+ CCA Prox  69      8                                               +----------+--------+--------+--------+-----------------------+--------+ CCA Distal61      14              smooth and heterogenous         +----------+--------+--------+--------+-----------------------+--------+ ICA Prox  43      11              smooth and heterogenous         +----------+--------+--------+--------+-----------------------+--------+ ICA Distal59      16                                              +----------+--------+--------+--------+-----------------------+--------+ ECA       85      6                                               +----------+--------+--------+--------+-----------------------+--------+ +----------+--------+-------+----------------+------------+           PSV cm/sEDV cmsDescribe        Arm Pressure +----------+--------+-------+----------------+------------+ [id number]             Multiphasic, WNL             +----------+--------+-------+----------------+------------+ +---------+--------+--+--------+--+---------+ VertebralPSV cm/s43EDV cm/s12Antegrade +---------+--------+--+--------+--+---------+ Left Carotid Findings: +----------+--------+--------+--------+--------------------------+--------+           PSV cm/sEDV cm/sStenosisDescribe                  Comments +----------+--------+--------+--------+--------------------------+--------+ CCA Prox  78      15                                                 +----------+--------+--------+--------+--------------------------+--------+ CCA Distal58      14                                                  +----------+--------+--------+--------+--------------------------+--------+  ICA Prox  47      10              heterogenous and irregular         +----------+--------+--------+--------+--------------------------+--------+ ICA Distal69      19                                                 +----------+--------+--------+--------+--------------------------+--------+ ECA       65      6               heterogenous and irregular         +----------+--------+--------+--------+--------------------------+--------+ +----------+--------+--------+----------------+------------+ SubclavianPSV cm/sEDV cm/sDescribe        Arm Pressure +----------+--------+--------+----------------+------------+           67              Multiphasic, WNL             +----------+--------+--------+----------------+------------+ +---------+--------+--+--------+--+---------+ VertebralPSV cm/s39EDV cm/s11Antegrade +---------+--------+--+--------+--+---------+  ABI Findings: +---------+------------------+-----+----------+--------+ Right    Rt Pressure (mmHg)IndexWaveform  Comment  +---------+------------------+-----+----------+--------+ Brachial 164                    triphasic          +---------+------------------+-----+----------+--------+ PTA      147               0.89 biphasic           +---------+------------------+-----+----------+--------+ DP       169               1.02 monophasic         +---------+------------------+-----+----------+--------+ Great Toe118               0.72                    +---------+------------------+-----+----------+--------+ +---------+------------------+-----+---------+-------+ Left     Lt Pressure (mmHg)IndexWaveform Comment +---------+------------------+-----+---------+-------+ Brachial 165                    triphasic        +---------+------------------+-----+---------+-------+ PTA      186               1.13 triphasic         +---------+------------------+-----+---------+-------+ DP       184               1.12 biphasic         +---------+------------------+-----+---------+-------+ Great Toe128               0.78                  +---------+------------------+-----+---------+-------+ +-------+---------------+----------------+ ABI/TBIToday's ABI/TBIPrevious ABI/TBI +-------+---------------+----------------+ Right  1.02/0.72                       +-------+---------------+----------------+ Left   1.13/0.78                       +-------+---------------+----------------+  Right Doppler Findings: +-----------+--------+-----+---------+-----------------------------------------+ Site       PressureIndexDoppler  Comments                                  +-----------+--------+-----+---------+-----------------------------------------+ Brachial   164  triphasic                                          +-----------+--------+-----+---------+-----------------------------------------+ Radial                  triphasic                                          +-----------+--------+-----+---------+-----------------------------------------+ Ulnar                   triphasic                                          +-----------+--------+-----+---------+-----------------------------------------+ Palmar Arch                      Signal is unaffected with radial                                           compression, obliterates wth ulnar                                         compression.                              +-----------+--------+-----+---------+-----------------------------------------+  Left Doppler Findings: +-----------+--------+-----+---------+-----------------------------------------+ Site       PressureIndexDoppler  Comments                                  +-----------+--------+-----+---------+-----------------------------------------+ Brachial   165           triphasic                                          +-----------+--------+-----+---------+-----------------------------------------+ Radial                  triphasic                                          +-----------+--------+-----+---------+-----------------------------------------+ Ulnar                   triphasic                                          +-----------+--------+-----+---------+-----------------------------------------+ Palmar Arch                      Signal decreases <50% with both radial  and ulnar compression.                    +-----------+--------+-----+---------+-----------------------------------------+   Summary: Right Carotid: Velocities in the right ICA are consistent with a 1-39% stenosis. Left Carotid: Velocities in the left ICA are consistent with a 1-39% stenosis. Vertebrals:  Bilateral vertebral arteries demonstrate antegrade flow. Subclavians: Normal flow hemodynamics were seen in bilateral subclavian              arteries. Right ABI: Resting right ankle-brachial index is within normal range, however given abnormal waveforms this is likely falsely elevated secondary to medial arterial calcification. The right toe-brachial index is normal. Left ABI: Resting left ankle-brachial index is within normal range. The left toe-brachial index is normal. Right Upper Extremity: Doppler waveforms remain within normal limits with right radial compression. Doppler waveform obliterate with right ulnar compression. Left Upper Extremity: Doppler waveforms decrease <50% w left radial compression. Doppler waveforms decrease <50% with left ulnar compression.  Electronically signed by Heath Lark on 05/07/2022 at 4:23:55 PM.    Final     Microbiology: Results for orders placed or performed during the hospital encounter of 05/18/22  Resp Panel by RT-PCR (Flu A&B, Covid) Anterior Nasal Swab     Status: None   Collection Time: 05/18/22   2:57 PM   Specimen: Anterior Nasal Swab  Result Value Ref Range Status   SARS Coronavirus 2 by RT PCR NEGATIVE NEGATIVE Final    Comment: (NOTE) SARS-CoV-2 target nucleic acids are NOT DETECTED.  The SARS-CoV-2 RNA is generally detectable in upper respiratory specimens during the acute phase of infection. The lowest concentration of SARS-CoV-2 viral copies this assay can detect is 138 copies/mL. A negative result does not preclude SARS-Cov-2 infection and should not be used as the sole basis for treatment or other patient management decisions. A negative result may occur with  improper specimen collection/handling, submission of specimen other than nasopharyngeal swab, presence of viral mutation(s) within the areas targeted by this assay, and inadequate number of viral copies(<138 copies/mL). A negative result must be combined with clinical observations, patient history, and epidemiological information. The expected result is Negative.  Fact Sheet for Patients:  BloggerCourse.com  Fact Sheet for Healthcare Providers:  SeriousBroker.it  This test is no t yet approved or cleared by the [place] FDA and  has been authorized for detection and/or diagnosis of SARS-CoV-2 by FDA under an Emergency Use Authorization (EUA). This EUA will remain  in effect (meaning this test can be used) for the duration of the COVID-19 declaration under Section 564(b)(1) of the Act, 21 U.S.C.section 360bbb-3(b)(1), unless the authorization is terminated  or revoked sooner.       Influenza A by PCR NEGATIVE NEGATIVE Final   Influenza B by PCR NEGATIVE NEGATIVE Final    Comment: (NOTE) The Xpert Xpress SARS-CoV-2/FLU/RSV plus assay is intended as an aid in the diagnosis of influenza from Nasopharyngeal swab specimens and should not be used as a sole basis for treatment. Nasal washings and aspirates are unacceptable for Xpert Xpress  SARS-CoV-2/FLU/RSV testing.  Fact Sheet for Patients: BloggerCourse.com  Fact Sheet for Healthcare Providers: SeriousBroker.it  This test is not yet approved or cleared by the [place] FDA and has been authorized for detection and/or diagnosis of SARS-CoV-2 by FDA under an Emergency Use Authorization (EUA). This EUA will remain in effect (meaning this test can be used) for the duration of the COVID-19 declaration under Section 564(b)(1) of the Act, 21 U.S.C. section  360bbb-3(b)(1), unless the authorization is terminated or revoked.  Performed at [facility], [place] [address], [place], [place] [address]     Labs: CBC: Recent Labs  Lab 05/23/22 1123 05/24/22 0151 05/25/22 0137 05/27/22 0225 05/28/22 0626  WBC  --  7.1 6.4 6.2 5.9  HGB 11.9* 12.3* 12.0* 11.5* 12.9*  HCT 35.0* 38.6* 36.8* 34.7* 40.5  MCV  --  92.6 91.3 90.6 92.0  PLT  --  306 257 234 [id number]   Basic Metabolic Panel: Recent Labs  Lab 05/23/22 0420 05/23/22 1054 05/24/22 0151 05/25/22 0137 05/26/22 0132 05/27/22 0225 05/28/22 0626  NA 139   < > 140 138 142 140 139  K 4.1   < > 3.6 4.0 4.2 3.8 4.2  CL 101  --  103 105 105 108 107  CO2 27  --  25 25 24 23 22   GLUCOSE 114*  --  118* 120* 110* 111* 110*  BUN 26*  --  25* 25* 25* 21 17  CREATININE 1.58*  --  1.41* 1.42* 1.34* 1.30* 1.23  CALCIUM 8.8*  --  9.2 9.0 9.1 8.8* 9.1  MG 2.1  --  2.2 2.2 2.1 2.1  --   PHOS  --   --   --   --   --  3.9  --    < > = values in this interval not displayed.   Liver Function Tests: No results for input(s): "AST", "ALT", "ALKPHOS", "BILITOT", "PROT", "ALBUMIN" in the last 168 hours. CBG: Recent Labs  Lab 05/26/22 2111 05/27/22 0653 05/27/22 1534 05/27/22 2129 05/28/22 0752  GLUCAP 121* 110* 170* 110* 116*    Discharge time spent: greater than 30 minutes.  Signed: Tawni Millers, MD [facility] 05/28/2022

## 2022-05-28 NOTE — Progress Notes (Signed)
Heart Failure Stewardship Pharmacist Progress Note   PCP: [name], MD PCP-Cardiologist: Orbie Pyo, MD    HPI:  81 yo M with PMH of HTN, HLD, afib, and CAD s/p CABG on 8/17.  He was discharged on 8/23 but presented back to the ED on 8/27 with shortness of breath, fatigue, hypoxia, LE edema, and wheezing. CXR with small L pleural effusion. CTA with acute PE. Received thoracentesis on 8/28 removing 1.6L of fluid. Pre-CABG, his EF was 35-45%. An ECHO on 8/28 has shown drop in LVEF to 20-25% with mildly reduced RV. Cardiology has been consulted. Concern for graft failure. Confirmed on [facility] 9/1. Also with normal filling pressures. S/p PCI with DES to distal RCA and PCI to left circumflex 9/5.   Discharge HF Medications: Diuretic: furosemide 40 mg PO daily Beta Blocker: metoprolol succinate 100 mg daily ACE/ARB/ARNI: losartan 25 mg daily SGLT2i: Jardiance 10 mg daily  Prior to admission HF Medications: Beta blocker: metoprolol tartrate 25 mg BID ACE/ARB/ARNI: fosinopril 20 mg daily (has not restarted yet Other: Imdur 30 mg daily ** also on diltiazem PO and HCTZ (has not restarted yet)  Pertinent Lab Values: Serum creatinine 1.23, BUN 17, Potassium 4.2, Sodium 139, BNP 961.0, Magnesium 2.1, A1c 6.3   Vital Signs: Weight: 197 lbs (admission weight: 228 lbs) Blood pressure: 120/80s  Heart rate: 60-70s  I/O: -1.9L yesterday  Medication Assistance / Insurance Benefits Check: Does the patient have prescription insurance?  Yes Type of insurance plan: [employer]  Outpatient Pharmacy:  Prior to admission outpatient pharmacy: [facility] Is the patient willing to use [facility] pharmacy at discharge? Yes Is the patient willing to transition their outpatient pharmacy to utilize a [place] outpatient pharmacy?   Pending    Assessment: 1. Acute on chronic systolic CHF (LVEF 20-25%), due to ICM s/p CABG on 8/17. S/p PCI/DES to graft failures on 9/5. NYHA class II  symptoms. - Agree with furosemide 40 mg PO daily on discharge - Continue metoprolol succinate 100 mg daily - Agree with starting losartan 25 mg daily  - Consider starting spironolactone at follow up to optimize GDMT - Continue Jardiance 10 mg daily - Discontinued diltiazem and HCTZ on discharge   Plan: 1) Medication changes recommended at this time: - Discharge today  2) Patient assistance: Sherryll Burger copay $40 Marcelline Deist copay $40 - Jardiance copay $40  3)  Education  - Patient has been educated on current HF medications and potential additions to HF medication regimen - Patient verbalizes understanding that over the next few months, these medication doses may change and more medications may be added to optimize HF regimen - Patient has been educated on basic disease state pathophysiology and goals of therapy   Sharen Hones, PharmD, BCPS Heart Failure Stewardship Pharmacist Phone [phone]

## 2022-05-29 ENCOUNTER — Ambulatory Visit: Payer: PPO

## 2022-05-29 DIAGNOSIS — Z7901 Long term (current) use of anticoagulants: Secondary | ICD-10-CM | POA: Diagnosis not present

## 2022-05-29 DIAGNOSIS — I130 Hypertensive heart and chronic kidney disease with heart failure and stage 1 through stage 4 chronic kidney disease, or unspecified chronic kidney disease: Secondary | ICD-10-CM | POA: Diagnosis not present

## 2022-05-29 DIAGNOSIS — I2690 Septic pulmonary embolism without acute cor pulmonale: Secondary | ICD-10-CM | POA: Diagnosis not present

## 2022-05-29 DIAGNOSIS — Z48812 Encounter for surgical aftercare following surgery on the circulatory system: Secondary | ICD-10-CM | POA: Diagnosis not present

## 2022-05-29 DIAGNOSIS — Z7984 Long term (current) use of oral hypoglycemic drugs: Secondary | ICD-10-CM | POA: Diagnosis not present

## 2022-05-29 DIAGNOSIS — I2510 Atherosclerotic heart disease of native coronary artery without angina pectoris: Secondary | ICD-10-CM | POA: Diagnosis not present

## 2022-05-29 DIAGNOSIS — N1831 Chronic kidney disease, stage 3a: Secondary | ICD-10-CM | POA: Diagnosis not present

## 2022-05-29 DIAGNOSIS — E1122 Type 2 diabetes mellitus with diabetic chronic kidney disease: Secondary | ICD-10-CM | POA: Diagnosis not present

## 2022-05-29 DIAGNOSIS — I480 Paroxysmal atrial fibrillation: Secondary | ICD-10-CM | POA: Diagnosis not present

## 2022-05-29 DIAGNOSIS — I5022 Chronic systolic (congestive) heart failure: Secondary | ICD-10-CM | POA: Diagnosis not present

## 2022-05-30 ENCOUNTER — Encounter (HOSPITAL_COMMUNITY): Payer: PPO

## 2022-06-05 DIAGNOSIS — Z09 Encounter for follow-up examination after completed treatment for conditions other than malignant neoplasm: Secondary | ICD-10-CM | POA: Diagnosis not present

## 2022-06-05 DIAGNOSIS — I10 Essential (primary) hypertension: Secondary | ICD-10-CM | POA: Diagnosis not present

## 2022-06-05 DIAGNOSIS — I5022 Chronic systolic (congestive) heart failure: Secondary | ICD-10-CM | POA: Diagnosis not present

## 2022-06-05 DIAGNOSIS — E785 Hyperlipidemia, unspecified: Secondary | ICD-10-CM | POA: Diagnosis not present

## 2022-06-05 DIAGNOSIS — Z955 Presence of coronary angioplasty implant and graft: Secondary | ICD-10-CM | POA: Diagnosis not present

## 2022-06-05 DIAGNOSIS — I2510 Atherosclerotic heart disease of native coronary artery without angina pectoris: Secondary | ICD-10-CM | POA: Diagnosis not present

## 2022-06-05 DIAGNOSIS — Z951 Presence of aortocoronary bypass graft: Secondary | ICD-10-CM | POA: Diagnosis not present

## 2022-06-08 NOTE — Progress Notes (Incomplete)
HEART & VASCULAR TRANSITION OF CARE CONSULT NOTE     Referring Physician: Primary Care: Primary Cardiologist:  HPI: Referred to clinic by *** for heart failure consultation. 81 y.o. male with histor of CAD s/p CABG 08/23, HFmEF, atrial fibrillation, HTN, HLD.  Patient underwent CABG X 5 on 05/08/22. Intra-op TEE with LVEF 40-45%. Had postoperative atrial fibrillation and developed bradycardia on amiodarone drip. He was started on cardizem and eliquis and converted to SR prior to discharge.   He was readmitted on 05/18/22 with segmental and subsegmental PE right middle and upper lobes, CHF, left pleural effusion s/p thoracentesis and recurrent atrial fibrillation. Echo 05/19/22: EF 25-30%, paradoxical septal motion consistent with postop status, mildly reduced RV.  He was diuresed with IV lasix and GDMT titrated. Cardiazem stopped d/t reduced EF and transitioned to metoprolol. Converted to SR during admit. PVCs also noted on tele.  Had early graft failure with occlusion of SVG to Diagonal and SVG to RCA occluded. LIMA to LAD and radial jump graft to OM and RPLV patent. Underwent successful staged PCI/DES to distal RCA and PCI/DES p Cx into OM1.  He is here today for hospital follow-up.  Cardiac Testing    Review of Systems: [y] = yes, [ ]  = no   General: Weight gain [ ] ; Weight loss [ ] ; Anorexia [ ] ; Fatigue [ ] ; Fever [ ] ; Chills [ ] ; Weakness [ ]   Cardiac: Chest pain/pressure [ ] ; Resting SOB [ ] ; Exertional SOB [ ] ; Orthopnea [ ] ; Pedal Edema [ ] ; Palpitations [ ] ; Syncope [ ] ; Presyncope [ ] ; Paroxysmal nocturnal dyspnea[ ]   Pulmonary: Cough [ ] ; Wheezing[ ] ; Hemoptysis[ ] ; Sputum [ ] ; Snoring [ ]   GI: Vomiting[ ] ; Dysphagia[ ] ; Melena[ ] ; Hematochezia [ ] ; Heartburn[ ] ; Abdominal pain [ ] ; Constipation [ ] ; Diarrhea [ ] ; BRBPR [ ]   GU: Hematuria[ ] ; Dysuria [ ] ; Nocturia[ ]   Vascular: Pain in legs with walking [ ] ; Pain in feet with lying flat [ ] ; Non-healing sores [ ] ;  Stroke [ ] ; TIA [ ] ; Slurred speech [ ] ;  Neuro: Headaches[ ] ; Vertigo[ ] ; Seizures[ ] ; Paresthesias[ ] ;Blurred vision [ ] ; Diplopia [ ] ; Vision changes [ ]   Ortho/Skin: Arthritis [ ] ; Joint pain [ ] ; Muscle pain [ ] ; Joint swelling [ ] ; Back Pain [ ] ; Rash [ ]   Psych: Depression[ ] ; Anxiety[ ]   Heme: Bleeding problems [ ] ; Clotting disorders [ ] ; Anemia [ ]   Endocrine: Diabetes [ ] ; Thyroid dysfunction[ ]    Past Medical History:  Diagnosis Date   Arthritis    History of kidney stones    Hyperlipidemia    Hypertension    Nocturia    S/P CABG (coronary artery bypass graft)     Current Outpatient Medications  Medication Sig Dispense Refill   apixaban (ELIQUIS) 5 MG TABS tablet Take 1 tablet (5 mg total) by mouth 2 (two) times daily. 60 tablet 3   aspirin EC 81 MG tablet Take 1 tablet (81 mg total) by mouth daily. 30 tablet 0   atorvastatin (LIPITOR) 80 MG tablet Take 1 tablet (80 mg total) by mouth daily. 30 tablet 0   clopidogrel (PLAVIX) 75 MG tablet Take 1 tablet (75 mg total) by mouth daily with breakfast. 30 tablet 0   cyanocobalamin (VITAMIN B12) 1000 MCG tablet Take 1,000 mcg by mouth daily.     empagliflozin (JARDIANCE) 10 MG TABS tablet Take 1 tablet (10 mg total) by mouth daily. 30 tablet 0  feeding supplement (ENSURE ENLIVE / ENSURE PLUS) LIQD Take 237 mLs by mouth 2 (two) times daily between meals. 14220 mL 0   furosemide (LASIX) 40 MG tablet Take 1 tablet (40 mg total) by mouth daily. 30 tablet 0   losartan (COZAAR) 25 MG tablet Take 1 tablet (25 mg total) by mouth daily. 30 tablet 0   metoprolol succinate (TOPROL-XL) 100 MG 24 hr tablet Take 1 tablet (100 mg total) by mouth daily. Take with or immediately following a meal. 30 tablet 0   Multiple Vitamin (MULTIVITAMIN WITH MINERALS) TABS tablet Take 1 tablet by mouth daily. 30 tablet 0   traMADol (ULTRAM) 50 MG tablet Take 1 tablet (50 mg total) by mouth every 4 (four) hours as needed for moderate pain. 30 tablet 0   No  current facility-administered medications for this visit.    No Known Allergies    Social History   Socioeconomic History   Marital status: Divorced    Spouse name: Not on file   Number of children: 3   Years of education: Not on file   Highest education level: Master's degree (e.g., MA, MS, MEng, MEd, MSW, MBA)  Occupational History   Occupation: retired Leisure centre manager at [employer] Use   Smoking status: Former    Years: 15.00    Types: Cigarettes    Quit date: 07/17/1975    Years since quitting: 46.9   Smokeless tobacco: Never  Vaping Use   Vaping Use: Never used  Substance and Sexual Activity   Alcohol use: No   Drug use: No   Sexual activity: Not on file  Other Topics Concern   Not on file  Social History Narrative   Lives alone   Social Determinants of Health   Financial Resource Strain: [place]  (05/20/2022)   Overall Financial Resource Strain (CARDIA)    Difficulty of Paying Living Expenses: Not hard at all  Food Insecurity: No [place] (05/20/2022)   Hunger Vital Sign    Worried About Running Out of Food in the Last Year: Never true    [place] in the Last Year: Never true  Transportation Needs: No Transportation Needs (05/20/2022)   PRAPARE - Hydrologist (Medical): No    Lack of Transportation (Non-Medical): No  Physical Activity: Not on file  Stress: Not on file  Social Connections: Not on file  Intimate Partner Violence: Not on file      Family History  Problem Relation Age of Onset   Hypertension Father    Cancer Father     There were no vitals filed for this visit.  PHYSICAL EXAM: General:  Well appearing. No respiratory difficulty HEENT: normal Neck: supple. no JVD. Carotids 2+ bilat; no bruits. No lymphadenopathy or thryomegaly appreciated. Cor: PMI nondisplaced. Regular rate & rhythm. No rubs, gallops or murmurs. Lungs: clear Abdomen: soft, nontender, nondistended. No hepatosplenomegaly. No bruits  or masses. Good bowel sounds. Extremities: no cyanosis, clubbing, rash, edema Neuro: alert & oriented x 3, cranial nerves grossly intact. moves all 4 extremities w/o difficulty. Affect pleasant.  ECG:   ASSESSMENT & PLAN: Chronic systolic CHF: -suspect iCM  2. CAD:  3. Paroxysmal atrial fibrillation:  4. HTN:  5. CKD III  6. Recent PE:   NYHA *** GDMT  Diuretic- BB- Ace/ARB/ARNI MRA SGLT2i    Referred to HFSW (PCP, Medications, Transportation, ETOH Abuse, Drug Abuse, Insurance, Financial ): Yes or No Refer to Pharmacy: Yes or No Refer  to Home Health: Yes on No Refer to Advanced Heart Failure Clinic: Yes or no  Refer to General Cardiology: Yes or No  Follow up

## 2022-06-09 ENCOUNTER — Inpatient Hospital Stay (HOSPITAL_COMMUNITY)
Admission: RE | Admit: 2022-06-09 | Discharge: 2022-06-09 | Disposition: A | Discharge status: Home / Self Care | Payer: PPO | Source: Ambulatory Visit | Attending: Internal Medicine | Admitting: Internal Medicine

## 2022-06-09 ENCOUNTER — Ambulatory Visit (HOSPITAL_COMMUNITY)
Admit: 2022-06-09 | Discharge: 2022-06-09 | Disposition: A | Discharge status: Home / Self Care | Payer: PPO | Source: Ambulatory Visit | Attending: Physician Assistant | Admitting: Physician Assistant

## 2022-06-09 ENCOUNTER — Telehealth (HOSPITAL_COMMUNITY): Payer: Self-pay | Admitting: *Deleted

## 2022-06-09 ENCOUNTER — Other Ambulatory Visit (HOSPITAL_COMMUNITY): Payer: Self-pay | Admitting: Internal Medicine

## 2022-06-09 ENCOUNTER — Encounter (HOSPITAL_COMMUNITY): Payer: Self-pay

## 2022-06-09 VITALS — BP 114/78 | HR 76 | Wt 193.8 lb

## 2022-06-09 DIAGNOSIS — I2510 Atherosclerotic heart disease of native coronary artery without angina pectoris: Secondary | ICD-10-CM

## 2022-06-09 DIAGNOSIS — Z951 Presence of aortocoronary bypass graft: Secondary | ICD-10-CM | POA: Insufficient documentation

## 2022-06-09 DIAGNOSIS — Z9889 Other specified postprocedural states: Secondary | ICD-10-CM | POA: Insufficient documentation

## 2022-06-09 DIAGNOSIS — I10 Essential (primary) hypertension: Secondary | ICD-10-CM | POA: Diagnosis not present

## 2022-06-09 DIAGNOSIS — Z7902 Long term (current) use of antithrombotics/antiplatelets: Secondary | ICD-10-CM | POA: Diagnosis not present

## 2022-06-09 DIAGNOSIS — Z7982 Long term (current) use of aspirin: Secondary | ICD-10-CM | POA: Diagnosis not present

## 2022-06-09 DIAGNOSIS — N183 Chronic kidney disease, stage 3 unspecified: Secondary | ICD-10-CM | POA: Diagnosis not present

## 2022-06-09 DIAGNOSIS — I493 Ventricular premature depolarization: Secondary | ICD-10-CM

## 2022-06-09 DIAGNOSIS — I5022 Chronic systolic (congestive) heart failure: Secondary | ICD-10-CM

## 2022-06-09 DIAGNOSIS — Z955 Presence of coronary angioplasty implant and graft: Secondary | ICD-10-CM | POA: Insufficient documentation

## 2022-06-09 DIAGNOSIS — I2694 Multiple subsegmental pulmonary emboli without acute cor pulmonale: Secondary | ICD-10-CM

## 2022-06-09 DIAGNOSIS — Z7984 Long term (current) use of oral hypoglycemic drugs: Secondary | ICD-10-CM | POA: Diagnosis not present

## 2022-06-09 DIAGNOSIS — I480 Paroxysmal atrial fibrillation: Secondary | ICD-10-CM | POA: Diagnosis not present

## 2022-06-09 DIAGNOSIS — Z79899 Other long term (current) drug therapy: Secondary | ICD-10-CM | POA: Insufficient documentation

## 2022-06-09 DIAGNOSIS — I130 Hypertensive heart and chronic kidney disease with heart failure and stage 1 through stage 4 chronic kidney disease, or unspecified chronic kidney disease: Secondary | ICD-10-CM | POA: Insufficient documentation

## 2022-06-09 DIAGNOSIS — Z7901 Long term (current) use of anticoagulants: Secondary | ICD-10-CM | POA: Insufficient documentation

## 2022-06-09 DIAGNOSIS — Z86711 Personal history of pulmonary embolism: Secondary | ICD-10-CM | POA: Diagnosis not present

## 2022-06-09 DIAGNOSIS — I2584 Coronary atherosclerosis due to calcified coronary lesion: Secondary | ICD-10-CM | POA: Diagnosis not present

## 2022-06-09 DIAGNOSIS — E785 Hyperlipidemia, unspecified: Secondary | ICD-10-CM | POA: Diagnosis not present

## 2022-06-09 DIAGNOSIS — J90 Pleural effusion, not elsewhere classified: Secondary | ICD-10-CM | POA: Diagnosis not present

## 2022-06-09 LAB — BASIC METABOLIC PANEL
Anion gap: 8 (ref 5–15)
BUN: 37 mg/dL — ABNORMAL HIGH (ref 8–23)
CO2: 25 mmol/L (ref 22–32)
Calcium: 9.4 mg/dL (ref 8.9–10.3)
Chloride: 107 mmol/L (ref 98–111)
Creatinine, Ser: 1.57 mg/dL — ABNORMAL HIGH (ref 0.61–1.24)
GFR, Estimated: 44 mL/min — ABNORMAL LOW (ref >60)
Glucose, Bld: 104 mg/dL — ABNORMAL HIGH (ref 70–99)
Potassium: 4.1 mmol/L (ref 3.5–5.1)
Sodium: 140 mmol/L (ref 135–145)

## 2022-06-09 LAB — CBC
HCT: 40.4 % (ref 39.0–52.0)
Hemoglobin: 13.1 g/dL (ref 13.0–17.0)
MCH: 29.7 pg (ref 26.0–34.0)
MCHC: 32.4 g/dL (ref 30.0–36.0)
MCV: 91.6 fL (ref 80.0–100.0)
Platelets: 235 10*3/uL (ref 150–400)
RBC: 4.41 MIL/uL (ref 4.22–5.81)
RDW: 14.3 % (ref 11.5–15.5)
WBC: 5.9 10*3/uL (ref 4.0–10.5)
nRBC: 0 % (ref 0.0–0.2)

## 2022-06-09 LAB — BRAIN NATRIURETIC PEPTIDE: B Natriuretic Peptide: 900.9 pg/mL — ABNORMAL HIGH (ref 0.0–100.0)

## 2022-06-09 MED ORDER — SPIRONOLACTONE 25 MG PO TABS: 12.5000 mg | ORAL_TABLET | Freq: Every day | ORAL | 3 refills | Status: DC

## 2022-06-09 NOTE — Addendum Note (Signed)
Encounter addended by: Joette Catching, PA-C on: 06/09/2022 4:27 PM  Actions taken: Clinical Note Signed

## 2022-06-09 NOTE — Addendum Note (Signed)
Encounter addended by: Scarlette Calico, RN on: 06/09/2022 3:43 PM  Actions taken: Order list changed, Diagnosis association updated

## 2022-06-09 NOTE — Addendum Note (Signed)
Encounter addended by: Joette Catching, PA-C on: 06/09/2022 3:56 PM  Actions taken: Clinical Note Signed

## 2022-06-09 NOTE — Telephone Encounter (Signed)
Called to confirm Heart & Vascular Transitions of Care appointment at 10 am on 06/09/2022. Patient reminded to bring all medications and pill box organizer with them. Confirmed patient has transportation. Gave directions, instructed to utilize [address] parking.  Confirmed appointment prior to ending call.    Earnestine Leys, BSN, Clinical cytogeneticist Only

## 2022-06-09 NOTE — Patient Instructions (Addendum)
Medication Changes:  START Spironolactone 12.5 mg (1/2 tab) daily  Lab Work:  Labs done today, your results will be available in MyChart, we will contact you for abnormal readings.  Your physician recommends that you return for lab work in: 1 week, we have given you a prescription to have this done locally  Testing/Procedures:  Your provider has recommended that  you wear a Zio Patch for 14 days.  This monitor will record your heart rhythm for our review.  IF you have any symptoms while wearing the monitor please press the button.  If you have any issues with the patch or you notice a red or orange light on it please call the company at [phone].  Once you remove the patch please mail it back to the company as soon as possible so we can get the results.  Your physician has requested that you have an echocardiogram. Echocardiography is a painless test that uses sound waves to create images of your heart. It provides your doctor with information about the size and shape of your heart and how well your heart's chambers and valves are working. This procedure takes approximately one hour. There are no restrictions for this procedure. IN 2 MONTHS  Referrals:  none  Special Instructions // Education:  Do the following things EVERYDAY: Weigh yourself in the morning before breakfast. Write it down and keep it in a log. Take your medicines as prescribed Eat low salt foods--Limit salt (sodium) to 2000 mg per day.  Stay as active as you can everyday Limit all fluids for the day to less than 2 liters   Follow-Up in: 4 weeks and again in 2 months with an echocardiogram   At the [facility], you and your health needs are our priority. We have a designated team specialized in the treatment of Heart Failure. This Care Team includes your primary Heart Failure Specialized Cardiologist (physician), Advanced Practice Providers (APPs- Physician Assistants and Nurse Practitioners),  and Pharmacist who all work together to provide you with the care you need, when you need it.   You may see any of the following providers on your designated Care Team at your next follow up:  Dr. Glori Bickers Dr. Loralie Champagne Dr. Roxana Hires, NP Lyda Jester, [place] [facility] [place], [place] Forestine Na, NP Audry Riles, PharmD   Please be sure to bring in all your medications bottles to every appointment.   Need to Contact us:  If you have any questions or concerns before your next appointment please send [place] a message through [place] or call our office at [phone].    TO LEAVE A MESSAGE FOR THE NURSE SELECT OPTION 2, PLEASE LEAVE A MESSAGE INCLUDING: YOUR NAME DATE OF BIRTH CALL BACK NUMBER REASON FOR CALL**this is important as we prioritize the call backs  YOU WILL RECEIVE A CALL BACK THE SAME DAY AS LONG AS YOU CALL BEFORE 4:00 PM

## 2022-06-13 ENCOUNTER — Telehealth (HOSPITAL_COMMUNITY): Payer: Self-pay | Admitting: *Deleted

## 2022-06-13 NOTE — Telephone Encounter (Signed)
Left vm for return call to schedule lab appt

## 2022-06-16 ENCOUNTER — Other Ambulatory Visit: Payer: Self-pay | Admitting: Thoracic Surgery (Cardiothoracic Vascular Surgery)

## 2022-06-16 DIAGNOSIS — Z951 Presence of aortocoronary bypass graft: Secondary | ICD-10-CM

## 2022-06-17 ENCOUNTER — Ambulatory Visit (HOSPITAL_COMMUNITY)
Admission: RE | Admit: 2022-06-17 | Discharge: 2022-06-17 | Disposition: A | Discharge status: Home / Self Care | Payer: PPO | Source: Ambulatory Visit | Attending: Cardiology | Admitting: Cardiology

## 2022-06-17 ENCOUNTER — Encounter: Payer: Self-pay | Admitting: Thoracic Surgery (Cardiothoracic Vascular Surgery)

## 2022-06-17 ENCOUNTER — Ambulatory Visit
Admission: RE | Admit: 2022-06-17 | Discharge: 2022-06-17 | Disposition: A | Discharge status: Home / Self Care | Payer: PPO | Source: Ambulatory Visit | Attending: Thoracic Surgery (Cardiothoracic Vascular Surgery) | Admitting: Thoracic Surgery (Cardiothoracic Vascular Surgery)

## 2022-06-17 ENCOUNTER — Other Ambulatory Visit (HOSPITAL_COMMUNITY): Payer: Self-pay | Admitting: *Deleted

## 2022-06-17 ENCOUNTER — Ambulatory Visit (INDEPENDENT_AMBULATORY_CARE_PROVIDER_SITE_OTHER): Payer: Self-pay | Admitting: Thoracic Surgery (Cardiothoracic Vascular Surgery)

## 2022-06-17 VITALS — BP 122/71 | HR 82 | Resp 18 | Ht 73.0 in | Wt 194.0 lb

## 2022-06-17 DIAGNOSIS — Z951 Presence of aortocoronary bypass graft: Secondary | ICD-10-CM

## 2022-06-17 DIAGNOSIS — I2510 Atherosclerotic heart disease of native coronary artery without angina pectoris: Secondary | ICD-10-CM

## 2022-06-17 DIAGNOSIS — J90 Pleural effusion, not elsewhere classified: Secondary | ICD-10-CM | POA: Diagnosis not present

## 2022-06-17 DIAGNOSIS — I5022 Chronic systolic (congestive) heart failure: Secondary | ICD-10-CM | POA: Insufficient documentation

## 2022-06-17 LAB — BASIC METABOLIC PANEL
Anion gap: 6 (ref 5–15)
BUN: 38 mg/dL — ABNORMAL HIGH (ref 8–23)
CO2: 28 mmol/L (ref 22–32)
Calcium: 9.5 mg/dL (ref 8.9–10.3)
Chloride: 106 mmol/L (ref 98–111)
Creatinine, Ser: 1.65 mg/dL — ABNORMAL HIGH (ref 0.61–1.24)
GFR, Estimated: 41 mL/min — ABNORMAL LOW (ref >60)
Glucose, Bld: 111 mg/dL — ABNORMAL HIGH (ref 70–99)
Potassium: 4.4 mmol/L (ref 3.5–5.1)
Sodium: 140 mmol/L (ref 135–145)

## 2022-06-17 NOTE — Progress Notes (Signed)
[address]       [place] [address]             [phone]     HPI: 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  He presented with severe fatigue after physical activity and an episode of chest pressure.  Dr. Saralyn Pilar did a cardiac CT which showed extensive atherosclerotic disease.  Catheterization showed left main and three-vessel disease with an EF of 40%.  I did coronary bypass grafting x5 on 05/08/2022.  I will happy with his artery grafts but his vein was large caliber and poor quality.  Postoperative atrial fibrillation.  He was started on amiodarone but that was stopped because of bradycardia.  He continued to have multiple runs of atrial fibrillation and was started on Eliquis.  He was in sinus at the time of discharge on postoperative day 7.  He presented back a few days later with extreme fatigue and shortness of breath.  CT of the chest showed a PE and was started on apixaban.  He underwent catheterization.  His artery grafts were patent but his vein grafts were occluded.  Dr. Tamala Julian did angioplasty and stenting of the RCA and the circumflex.  He had a good result.  He is feeling better.  He is not taking anything for incisional pain.  He he has an occasional sharp pain at the sternotomy site.  He is walking about a mile a day.  Past Medical History:  Diagnosis Date   Arthritis    History of kidney stones    Hyperlipidemia    Hypertension    Nocturia    S/P CABG (coronary artery bypass graft)     Current Outpatient Medications  Medication Sig Dispense Refill   apixaban (ELIQUIS) 5 MG TABS tablet Take 1 tablet (5 mg total) by mouth 2 (two) times daily. 60 tablet 3   aspirin EC 81 MG tablet Take 1 tablet (81 mg total) by mouth daily. 30 tablet 0   atorvastatin  (LIPITOR) 80 MG tablet Take 1 tablet (80 mg total) by mouth daily. 30 tablet 0   clopidogrel (PLAVIX) 75 MG tablet Take 1 tablet (75 mg total) by mouth daily with breakfast. 30 tablet 0   cyanocobalamin (VITAMIN B12) 1000 MCG tablet Take 1,000 mcg by mouth daily.     empagliflozin (JARDIANCE) 10 MG TABS tablet Take 1 tablet (10 mg total) by mouth daily. 30 tablet 0   furosemide (LASIX) 40 MG tablet Take 1 tablet (40 mg total) by mouth daily. 30 tablet 0   losartan (COZAAR) 25 MG tablet Take 1 tablet (25 mg total) by mouth daily. 30 tablet 0   metoprolol succinate (TOPROL-XL) 100 MG 24 hr tablet Take 1 tablet (100 mg total) by mouth daily. Take with or immediately following a meal. 30 tablet 0   Multiple Vitamin (MULTIVITAMIN WITH MINERALS) TABS tablet Take 1 tablet by mouth daily. 30 tablet 0   spironolactone (ALDACTONE) 25 MG tablet Take 0.5 tablets (12.5 mg total) by mouth daily. 15 tablet 3   traMADol (ULTRAM) 50 MG tablet Take 1 tablet (50 mg total) by mouth every 4 (four) hours as needed for moderate pain. 30 tablet 0   No current facility-administered medications for this visit.    Physical Exam BP 122/71 (BP Location: Right Arm, Patient Position: Sitting)  Pulse 82   Resp 18   Ht 6\' 1"  (1.854 m)   Wt 194 lb (88 kg)   SpO2 99%   BMI 25.51 kg/m  81 year old man in no acute distress Alert and oriented x3 with no focal deficits Cardiac regular rate and rhythm with a normal S1 and S2 Lungs clear bilaterally Sternum stable, incision clean dry and intact Left arm incision intact Leg incisions healing well, trace edema and severe varicosities in lower extremities  Diagnostic Tests: CHEST - 2 VIEW   COMPARISON:  Chest x-ray dated May 24, 2022.   FINDINGS: The heart size and mediastinal contours are within normal limits. Prior CABG. Decreased now trace left pleural effusion. Resolved right pleural effusion. No consolidation or pneumothorax. No acute osseous abnormality.    IMPRESSION: 1. Decreased now trace left pleural effusion. Resolved right pleural effusion.     Electronically Signed   By: Titus Dubin M.D.   On: 06/17/2022 15:01 I personally reviewed the chest x-ray images.  There is a tiny left pleural effusion.  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  Work-up revealed left main and three-vessel disease with ejection fraction of 40%.  He underwent coronary bypass grafting x5 on May 08, 2022.  He then presented back about 10 days later with fatigue and shortness of breath and was found to have small pulmonary embolus and occlusion of his vein grafts.  The LIMA and segment of the left radial between the OM and PL were both patent.  He underwent his angioplasty and stenting of his native right and circumflex on 05/27/2022.  Currently he is doing well.  He does not have much stamina, which is expected this early after the surgery.  He has not had any recurrent episodes of severe fatigue or chest pain that he was having previously.  I recommended he wait until the end of the week before lifting over 10 pounds.  He should wait 3 weeks before lifting anything over 20 pounds.  Should wait until all mid November before trying to play golf.  He may drive.  Appropriate precautions were discussed.  Plan: Follow-up with Dr. Saralyn Pilar Return in 2 months to check on progress  Melrose Nakayama, MD [facility] [phone]

## 2022-06-20 ENCOUNTER — Encounter: Payer: Self-pay | Admitting: Urology

## 2022-06-27 DIAGNOSIS — I493 Ventricular premature depolarization: Secondary | ICD-10-CM | POA: Diagnosis not present

## 2022-07-07 ENCOUNTER — Other Ambulatory Visit (HOSPITAL_COMMUNITY): Payer: Self-pay | Admitting: *Deleted

## 2022-07-07 ENCOUNTER — Ambulatory Visit (HOSPITAL_COMMUNITY)
Admission: RE | Admit: 2022-07-07 | Discharge: 2022-07-07 | Disposition: A | Discharge status: Home / Self Care | Payer: PPO | Source: Ambulatory Visit | Attending: Family Medicine | Admitting: Family Medicine

## 2022-07-07 ENCOUNTER — Encounter (HOSPITAL_COMMUNITY): Payer: Self-pay

## 2022-07-07 VITALS — BP 124/72 | HR 84 | Ht 73.0 in | Wt 200.6 lb

## 2022-07-07 DIAGNOSIS — Z7901 Long term (current) use of anticoagulants: Secondary | ICD-10-CM | POA: Diagnosis not present

## 2022-07-07 DIAGNOSIS — Z955 Presence of coronary angioplasty implant and graft: Secondary | ICD-10-CM | POA: Insufficient documentation

## 2022-07-07 DIAGNOSIS — I130 Hypertensive heart and chronic kidney disease with heart failure and stage 1 through stage 4 chronic kidney disease, or unspecified chronic kidney disease: Secondary | ICD-10-CM | POA: Insufficient documentation

## 2022-07-07 DIAGNOSIS — I2510 Atherosclerotic heart disease of native coronary artery without angina pectoris: Secondary | ICD-10-CM

## 2022-07-07 DIAGNOSIS — Z7984 Long term (current) use of oral hypoglycemic drugs: Secondary | ICD-10-CM | POA: Diagnosis not present

## 2022-07-07 DIAGNOSIS — I5022 Chronic systolic (congestive) heart failure: Secondary | ICD-10-CM

## 2022-07-07 DIAGNOSIS — J90 Pleural effusion, not elsewhere classified: Secondary | ICD-10-CM | POA: Diagnosis not present

## 2022-07-07 DIAGNOSIS — N183 Chronic kidney disease, stage 3 unspecified: Secondary | ICD-10-CM

## 2022-07-07 DIAGNOSIS — I2584 Coronary atherosclerosis due to calcified coronary lesion: Secondary | ICD-10-CM

## 2022-07-07 DIAGNOSIS — Z7902 Long term (current) use of antithrombotics/antiplatelets: Secondary | ICD-10-CM | POA: Insufficient documentation

## 2022-07-07 DIAGNOSIS — I2694 Multiple subsegmental pulmonary emboli without acute cor pulmonale: Secondary | ICD-10-CM

## 2022-07-07 DIAGNOSIS — Z951 Presence of aortocoronary bypass graft: Secondary | ICD-10-CM | POA: Insufficient documentation

## 2022-07-07 DIAGNOSIS — I493 Ventricular premature depolarization: Secondary | ICD-10-CM | POA: Diagnosis not present

## 2022-07-07 DIAGNOSIS — I480 Paroxysmal atrial fibrillation: Secondary | ICD-10-CM

## 2022-07-07 DIAGNOSIS — Z9889 Other specified postprocedural states: Secondary | ICD-10-CM | POA: Insufficient documentation

## 2022-07-07 DIAGNOSIS — Z86711 Personal history of pulmonary embolism: Secondary | ICD-10-CM | POA: Insufficient documentation

## 2022-07-07 DIAGNOSIS — Z79899 Other long term (current) drug therapy: Secondary | ICD-10-CM | POA: Insufficient documentation

## 2022-07-07 DIAGNOSIS — I10 Essential (primary) hypertension: Secondary | ICD-10-CM

## 2022-07-07 LAB — BASIC METABOLIC PANEL
Anion gap: 9 (ref 5–15)
BUN: 25 mg/dL — ABNORMAL HIGH (ref 8–23)
CO2: 24 mmol/L (ref 22–32)
Calcium: 9 mg/dL (ref 8.9–10.3)
Chloride: 108 mmol/L (ref 98–111)
Creatinine, Ser: 1.61 mg/dL — ABNORMAL HIGH (ref 0.61–1.24)
GFR, Estimated: 43 mL/min — ABNORMAL LOW (ref >60)
Glucose, Bld: 110 mg/dL — ABNORMAL HIGH (ref 70–99)
Potassium: 3.9 mmol/L (ref 3.5–5.1)
Sodium: 141 mmol/L (ref 135–145)

## 2022-07-07 LAB — CBC
HCT: 38 % — ABNORMAL LOW (ref 39.0–52.0)
Hemoglobin: 12.1 g/dL — ABNORMAL LOW (ref 13.0–17.0)
MCH: 28.9 pg (ref 26.0–34.0)
MCHC: 31.8 g/dL (ref 30.0–36.0)
MCV: 90.7 fL (ref 80.0–100.0)
Platelets: 190 10*3/uL (ref 150–400)
RBC: 4.19 MIL/uL — ABNORMAL LOW (ref 4.22–5.81)
RDW: 15.3 % (ref 11.5–15.5)
WBC: 5.9 10*3/uL (ref 4.0–10.5)
nRBC: 0 % (ref 0.0–0.2)

## 2022-07-07 LAB — BRAIN NATRIURETIC PEPTIDE: B Natriuretic Peptide: 1338.2 pg/mL — ABNORMAL HIGH (ref 0.0–100.0)

## 2022-07-07 MED ORDER — CLOPIDOGREL BISULFATE 75 MG PO TABS: 75.0000 mg | ORAL_TABLET | Freq: Every day | ORAL | 7 refills | Status: DC

## 2022-07-07 MED ORDER — METOPROLOL SUCCINATE ER 100 MG PO TB24: 100.0000 mg | ORAL_TABLET | Freq: Every day | ORAL | 7 refills | Status: DC

## 2022-07-07 MED ORDER — ISOSORBIDE MONONITRATE ER 30 MG PO TB24: 30.0000 mg | ORAL_TABLET | Freq: Every day | ORAL | 6 refills | Status: DC

## 2022-07-07 MED ORDER — FUROSEMIDE 20 MG PO TABS: 20.0000 mg | ORAL_TABLET | Freq: Every day | ORAL | 7 refills | Status: DC

## 2022-07-07 MED ORDER — AMIODARONE HCL 200 MG PO TABS: ORAL_TABLET | ORAL | 6 refills | Status: DC

## 2022-07-07 MED ORDER — EMPAGLIFLOZIN 10 MG PO TABS: 10.0000 mg | ORAL_TABLET | Freq: Every day | ORAL | 7 refills | Status: AC

## 2022-07-07 MED ORDER — ATORVASTATIN CALCIUM 80 MG PO TABS: 80.0000 mg | ORAL_TABLET | Freq: Every day | ORAL | 7 refills | Status: DC

## 2022-07-07 MED ORDER — LOSARTAN POTASSIUM 25 MG PO TABS: 25.0000 mg | ORAL_TABLET | Freq: Every day | ORAL | 7 refills | Status: DC

## 2022-07-07 MED ORDER — SPIRONOLACTONE 25 MG PO TABS: 12.5000 mg | ORAL_TABLET | Freq: Every day | ORAL | 7 refills | Status: DC

## 2022-07-07 MED ORDER — APIXABAN 5 MG PO TABS: 5.0000 mg | ORAL_TABLET | Freq: Two times a day (BID) | ORAL | 6 refills | Status: DC

## 2022-07-07 NOTE — Progress Notes (Addendum)
ADVANCED HF CLINIC CONSULT NOTE   Primary Care: Maryland Pink, MD Primary Cardiologist: Dr. Saralyn Pilar HF Cardiologist: Dr. Haroldine Laws  HPI: [name] Goodman is a 81 y.o.male with history of CAD s/p CABG 08/23, HFmEF, atrial fibrillation, HTN, HLD.   Patient underwent CABG X 5 on 05/08/22. Intra-op TEE with LVEF 40-45%. Had postoperative atrial fibrillation and developed bradycardia on amiodarone drip. He was started on cardizem and Eliquis and converted to SR prior to discharge.    He was readmitted on 05/18/22 with segmental and subsegmental PE right middle and upper lobes, CHF, left pleural effusion s/p thoracentesis and recurrent atrial fibrillation. Echo 05/19/22: EF 25-30%, paradoxical septal motion consistent with postop status, mildly reduced RV. He was diuresed with IV lasix and GDMT titrated. Cardiazem stopped d/t reduced EF and transitioned to metoprolol. Converted to SR during admit. PVCs also noted on tele. Had early graft failure with occlusion of SVG to Diagonal and SVG to RCA occluded. LIMA to LAD and radial jump graft to OM and RPLV patent. Underwent successful staged PCI/DES to distal RCA and PCI/DES p Cx into OM1.   Seen in TOC, NYHA II and volume stable. Remained in NSR and spiro 12.5 added and referred to AHF.  Today he returns for HF follow up with his partner of 28 years, referred from [facility]. Overall feeling fair. She says he wakes during the night gasping for breath. He says he is more SOB this morning,  + Bendopnea. He walks 2 miles/day with his significant other without SOB. Denies palpitations, CP, dizziness, edema, or orthopnea.  Appetite ok. No fever or chills. Weight at home 196 pounds. Taking all medications.  Has mild BRBPR ongoing x 1 year, has flare up every 1-2 weeks. He attributes this to his known hemorrhoids.  Frustrated with frequent cardiology visits and med changes.  Cardiac Studies  - Zio 2 week (9/23): mostly AF/AFL (84%) burden, PVCs (18/6% burden),  589 runs of NSVT  - Echo (05/18/22): EF 25-30%, paradoxical septal motion consistent with postop status, mildly reduced RV.   - Intra-op TEE (05/08/22): EF 40-45%  Review of Systems: [y] = yes, [ ]  = no    General: Weight gain [ ] ; Weight loss [ ] ; Anorexia [ ] ; Fatigue [ y]; Fever [ ] ; Chills [ ] ; Weakness [ ]   Cardiac: Chest pain/pressure [ ] ; Resting SOB [ ] ; Exertional SOB [ ] ; Orthopnea [ ] ; Pedal Edema [ ] ; Palpitations [ ] ; Syncope [ ] ; Presyncope [ ] ; Paroxysmal nocturnal dyspnea[y ]  Pulmonary: Cough [ ] ; Wheezing[ ] ; Hemoptysis[ ] ; Sputum [ ] ; Snoring [ ]   GI: Vomiting[ ] ; Dysphagia[ ] ; Melena[ ] ; Hematochezia [ ] ; Heartburn[ ] ; Abdominal pain [ ] ; Constipation [ ] ; Diarrhea [ ] ; BRBPR [ ]   GU: Hematuria[ ] ; Dysuria [ ] ; Nocturia[ ]   Vascular: Pain in legs with walking [ ] ; Pain in feet with lying flat [ ] ; Non-healing sores [ ] ; Stroke [ ] ; TIA [ ] ; Slurred speech [ ] ;  Neuro: Headaches[ ] ; Vertigo[ ] ; Seizures[ ] ; Paresthesias[ ] ;Blurred vision [ ] ; Diplopia [ ] ; Vision changes [ ]   Ortho/Skin: Arthritis [ ] ; Joint pain [ ] ; Muscle pain [ ] ; Joint swelling [ ] ; Back Pain [ ] ; Rash [ ]   Psych: Depression[ ] ; Anxiety[ ]   Heme: Bleeding problems [ ] ; Clotting disorders [ ] ; Anemia [ ]   Endocrine: Diabetes [ ] ; Thyroid dysfunction[ ]    Past Medical History:  Diagnosis Date   Arthritis    History of kidney stones  Hyperlipidemia    Hypertension    Nocturia    S/P CABG (coronary artery bypass graft)    Current Outpatient Medications  Medication Sig Dispense Refill   apixaban (ELIQUIS) 5 MG TABS tablet Take 1 tablet (5 mg total) by mouth 2 (two) times daily. 60 tablet 3   atorvastatin (LIPITOR) 80 MG tablet Take 1 tablet (80 mg total) by mouth daily. 30 tablet 0   cyanocobalamin (VITAMIN B12) 1000 MCG tablet Take 1,000 mcg by mouth daily.     empagliflozin (JARDIANCE) 10 MG TABS tablet Take 1 tablet (10 mg total) by mouth daily. 30 tablet 0   furosemide (LASIX) 40 MG tablet  Take 1 tablet (40 mg total) by mouth daily. 30 tablet 0   isosorbide mononitrate (IMDUR) 30 MG 24 hr tablet Take 30 mg by mouth daily.     losartan (COZAAR) 25 MG tablet Take 1 tablet (25 mg total) by mouth daily. 30 tablet 0   metoprolol succinate (TOPROL-XL) 100 MG 24 hr tablet Take 1 tablet (100 mg total) by mouth daily. Take with or immediately following a meal. 30 tablet 0   traMADol (ULTRAM) 50 MG tablet Take 1 tablet (50 mg total) by mouth every 4 (four) hours as needed for moderate pain. (Patient not taking: Reported on 07/07/2022) 30 tablet 0   No current facility-administered medications for this encounter.   No Known Allergies  Social History   Socioeconomic History   Marital status: Divorced    Spouse name: Not on file   Number of children: 3   Years of education: Not on file   Highest education level: Master's degree (e.g., MA, MS, MEng, MEd, MSW, MBA)  Occupational History   Occupation: retired Leisure centre manager at [employer] Use   Smoking status: Former    Years: 15.00    Types: Cigarettes    Quit date: 07/17/1975    Years since quitting: 47.0   Smokeless tobacco: Never  Vaping Use   Vaping Use: Never used  Substance and Sexual Activity   Alcohol use: No   Drug use: No   Sexual activity: Not on file  Other Topics Concern   Not on file  Social History Narrative   Lives alone   Social Determinants of Health   Financial Resource Strain: [place]  (05/20/2022)   Overall Financial Resource Strain (CARDIA)    Difficulty of Paying Living Expenses: Not hard at all  Food Insecurity: No [place] (05/20/2022)   Hunger Vital Sign    Worried About Running Out of Food in the Last Year: Never true    [place] in the Last Year: Never true  Transportation Needs: No Transportation Needs (05/20/2022)   PRAPARE - Hydrologist (Medical): No    Lack of Transportation (Non-Medical): No  Physical Activity: Not on file  Stress: Not on file   Social Connections: Not on file  Intimate Partner Violence: Not on file   Family History  Problem Relation Age of Onset   Hypertension Father    Cancer Father    BP 124/72   Pulse 84   Ht 6\' 1"  (1.854 m)   Wt 91 kg (200 lb 9.6 oz)   SpO2 98%   BMI 26.47 kg/m   Wt Readings from Last 3 Encounters:  07/07/22 91 kg (200 lb 9.6 oz)  06/17/22 88 kg (194 lb)  06/09/22 87.9 kg (193 lb 12.8 oz)   PHYSICAL EXAM: General:  NAD. No  resp difficulty, walked into clinic  HEENT: Normal Neck: Supple. JVP 6-7. Carotids 2+ bilat; no bruits. No lymphadenopathy or thryomegaly appreciated. Cor: PMI nondisplaced. Irregular rate & rhythm. No rubs, gallops or murmurs. Lungs: Clear Abdomen: Soft, nontender, nondistended. No hepatosplenomegaly. No bruits or masses. Good bowel sounds. Extremities: No cyanosis, clubbing, rash, edema Neuro: Alert & oriented x 3, cranial nerves grossly intact. Moves all 4 extremities w/o difficulty. Affect pleasant.  ECG: atrial fibrillation with frequent PVCs (personally reviewed)  ReDs: 34%  ASSESSMENT & PLAN: Chronic systolic CHF: - preCABG LV gram (7/23): EF 35-40%  - Intraop TEE 05/08/22: EF 40-45% - Limited echo 05/19/22: EF 25-30%, RV mildly reduced - S/p PCI/DES to RCA and PCI/DES to p Lcx into OM1 (see below).  - suspect primarily iCM. Had early graft failure post CABG. Vs PVC CM (see #4). - Currently NYHA II-early III. Volume looks good on exam, ReDs 34% - Stop losartan. - Start Entresto 24/26 mg bid. - Restart spiro 12.5 mg daily.  - Decrease Lasix to 20 mg daily. - Continue Jardiance 10 mg daily. - Continue Toprol XL 100 mg daily. - Cardiac rehab when okay with CT surgery. - Labs today. Repeat BMET in 1 week.   2. CAD: - S/p CABG X 5 (LIMA to LAD, SVG to PDA, SVG to D1, sequential left radial artery to OM and PL) 05/08/22 - Early graft failure with occlusion of SVG to D1 and SVG to RCA. LIMA to LAD and radial graft to OM1 and RPLV patent - S/p  PCI/DES to RCA and PCI/DES p Lcx into OM1 05/27/22. - No chest pain. - Continue Plavix and Eliquis. - Continue statin - Has f/u with [facility].   3. Paroxysmal atrial fibrillation: - Noted post CABG. - Had recurrence at time of recent admission. Converted to SR spontaneously.  - Zio 2 week (9/23) showed mostly AF/AFL 84% burden, frequent PVCs (18/6%) - Start amiodarone 200 mg bid x 2 weeks then decrease to 200 mg daily. - Continue Toprol XL 100 mg daily. - Continue Eliquis 5 mg bid. Recent CBC stable. - Labs today. Check LFTs and TSH in 6-8 weeks.  4. Frequent PVCs - Zio 2 week (9/23): PVCs 18.6% - Start amio as above. - We discussed sleep study today, he would like to hold off for now.   5. HTN: - BP stable. - Med changes as above.   6. CKD III: - Baseline Scr 1.3-1.5 - Labs today   7. Recent PE: - Post CABG - Continue Eliquis 5 mg bid.   8. Left pleural effusion: - s/p thoracentesis during recent admit - Trace effusion on CXR 06/17/22.   Follow up in 4 weeks with APP (repeat ECG and BMET) and keep follow up in 2 months with Dr. Haroldine Laws + echo. If EF has recovered, will have him f/u in AHF clinic as needed.  Allena Katz, FNP-BC 07/07/22  Greater than 50% of the (total minutes 42 minutes) visit spent in counseling/coordination of care regarding ( causes of heart failure, new atrial fibrillation & associated meds to treat, changes in GDMT, repeat echo and follow up)

## 2022-07-07 NOTE — Patient Instructions (Addendum)
Thank you for coming in today  Labs were done today, if any labs are abnormal the clinic will call you No news is good news  START Amiodarone 200 mg 1 tablet twice daily for 2 weeks then decrease it to  200 mg 1 tablet daily   START Entresto 24/26 mg 1 tablet twice daily   RESTART Spironolactone 12.5 mg 1/2 tablet daily   STOP Losartan  DECREASE Lasix 20 mg 1 tablet daily   All heart medications have been refilled  Your physician recommends that you schedule a follow-up appointment in:  4 weeks in clinic  Keep follow up appointment with echocardiogram with Dr. Haroldine Laws    Do the following things EVERYDAY: Weigh yourself in the morning before breakfast. Write it down and keep it in a log. Take your medicines as prescribed Eat low salt foods--Limit salt (sodium) to 2000 mg per day.  Stay as active as you can everyday Limit all fluids for the day to less than 2 liters  At the [facility], you and your health needs are our priority. As part of our continuing mission to provide you with exceptional heart care, we have created designated Provider Care Teams. These Care Teams include your primary Cardiologist (physician) and Advanced Practice Providers (APPs- Physician Assistants and Nurse Practitioners) who all work together to provide you with the care you need, when you need it.   You may see any of the following providers on your designated Care Team at your next follow up: Dr Glori Bickers Dr Loralie Champagne Dr. Roxana Hires, NP Lyda Jester, [place] [facility] [place], [place] Forestine Na, NP Audry Riles, PharmD   Please be sure to bring in all your medications bottles to every appointment.   If you have any questions or concerns before your next appointment please send [place] a message through [employer] or call our office at [phone].    TO LEAVE A MESSAGE FOR THE NURSE SELECT OPTION 2, PLEASE LEAVE A MESSAGE INCLUDING: YOUR  NAME DATE OF BIRTH CALL BACK NUMBER REASON FOR CALL**this is important as we prioritize the call backs  YOU WILL RECEIVE A CALL BACK THE SAME DAY AS LONG AS YOU CALL BEFORE 4:00 PM

## 2022-07-07 NOTE — Progress Notes (Signed)
ReDS Vest / Clip - 07/07/22 1100       ReDS Vest / Clip   Station Marker D    Ruler Value 34    ReDS Value Range Low volume    ReDS Actual Value 34    Anatomical Comments sitting

## 2022-07-07 NOTE — Addendum Note (Signed)
Encounter addended by: Rafael Bihari, FNP on: 07/07/2022 11:48 AM  Actions taken: Clinical Note Signed

## 2022-07-08 ENCOUNTER — Other Ambulatory Visit (HOSPITAL_COMMUNITY): Payer: Self-pay

## 2022-07-08 MED ORDER — ENTRESTO 24-26 MG PO TABS: 1.0000 | ORAL_TABLET | Freq: Two times a day (BID) | ORAL | 11 refills | Status: DC

## 2022-07-08 NOTE — Telephone Encounter (Signed)
Meds ordered this encounter  Medications   sacubitril-valsartan (ENTRESTO) 24-26 MG    Sig: Take 1 tablet by mouth 2 (two) times daily.    Dispense:  60 tablet    Refill:  11

## 2022-07-09 ENCOUNTER — Encounter: Payer: PPO | Attending: Cardiology | Admitting: *Deleted

## 2022-07-09 ENCOUNTER — Encounter: Payer: Self-pay | Admitting: *Deleted

## 2022-07-09 DIAGNOSIS — Z955 Presence of coronary angioplasty implant and graft: Secondary | ICD-10-CM | POA: Insufficient documentation

## 2022-07-09 DIAGNOSIS — Z951 Presence of aortocoronary bypass graft: Secondary | ICD-10-CM | POA: Insufficient documentation

## 2022-07-09 NOTE — Progress Notes (Signed)
Virtual orientation call completed today. he has an appointment on Date: 07/15/2022  for EP eval and gym Orientation.  Documentation of diagnosis can be found in [facility] 05/18/2022 and 05/08/2022 .

## 2022-07-10 ENCOUNTER — Telehealth (HOSPITAL_COMMUNITY): Payer: Self-pay | Admitting: *Deleted

## 2022-07-10 NOTE — Telephone Encounter (Signed)
Pts caregiver called stating he is very short of breath when laying down and noticeably short of breath throughout the day. Per Darrick Grinder, NPC take lasix 80mg  today and tomorrow. Caregiver aware and will call back if that does not help.

## 2022-07-14 ENCOUNTER — Ambulatory Visit (HOSPITAL_COMMUNITY)
Admission: RE | Admit: 2022-07-14 | Discharge: 2022-07-14 | Disposition: A | Discharge status: Home / Self Care | Payer: PPO | Source: Ambulatory Visit | Attending: Internal Medicine | Admitting: Internal Medicine

## 2022-07-14 DIAGNOSIS — I5022 Chronic systolic (congestive) heart failure: Secondary | ICD-10-CM | POA: Diagnosis not present

## 2022-07-14 LAB — BASIC METABOLIC PANEL
Anion gap: 9 (ref 5–15)
BUN: 26 mg/dL — ABNORMAL HIGH (ref 8–23)
CO2: 25 mmol/L (ref 22–32)
Calcium: 9.2 mg/dL (ref 8.9–10.3)
Chloride: 106 mmol/L (ref 98–111)
Creatinine, Ser: 1.69 mg/dL — ABNORMAL HIGH (ref 0.61–1.24)
GFR, Estimated: 40 mL/min — ABNORMAL LOW (ref >60)
Glucose, Bld: 124 mg/dL — ABNORMAL HIGH (ref 70–99)
Potassium: 3.9 mmol/L (ref 3.5–5.1)
Sodium: 140 mmol/L (ref 135–145)

## 2022-07-15 VITALS — Ht 73.5 in | Wt 201.8 lb

## 2022-07-15 DIAGNOSIS — Z951 Presence of aortocoronary bypass graft: Secondary | ICD-10-CM | POA: Diagnosis not present

## 2022-07-15 DIAGNOSIS — Z955 Presence of coronary angioplasty implant and graft: Secondary | ICD-10-CM | POA: Diagnosis not present

## 2022-07-15 NOTE — Progress Notes (Signed)
Cardiac Individual Treatment Plan  Patient Details  Name: [name] Goodman MRN: [id number] Date of Birth: 07-05-1941 Referring Provider:   Flowsheet Row Cardiac Rehab from 07/15/2022 in [facility] Cardiac and Pulmonary Rehab  Referring Provider Isaias Cowman MD       Initial Encounter Date:  Flowsheet Row Cardiac Rehab from 07/15/2022 in [facility] Cardiac and Pulmonary Rehab  Date 07/15/22       Visit Diagnosis: Status post coronary artery stent placement  S/P CABG x 5  Patient's Home Medications on Admission:  Current Outpatient Medications:    amiodarone (PACERONE) 200 MG tablet, Amiodarone 200 mg 1 tablet twice daily for 2 weeks then decrease it to 200 mg 1 tablet daily, Disp: 60 tablet, Rfl: 6   apixaban (ELIQUIS) 5 MG TABS tablet, Take 1 tablet (5 mg total) by mouth 2 (two) times daily., Disp: 60 tablet, Rfl: 6   atorvastatin (LIPITOR) 80 MG tablet, Take 1 tablet (80 mg total) by mouth daily., Disp: 30 tablet, Rfl: 7   clopidogrel (PLAVIX) 75 MG tablet, Take 1 tablet (75 mg total) by mouth daily., Disp: 30 tablet, Rfl: 7   cyanocobalamin (VITAMIN B12) 1000 MCG tablet, Take 1,000 mcg by mouth daily. (Patient not taking: Reported on 07/09/2022), Disp: , Rfl:    empagliflozin (JARDIANCE) 10 MG TABS tablet, Take 1 tablet (10 mg total) by mouth daily., Disp: 30 tablet, Rfl: 7   furosemide (LASIX) 20 MG tablet, Take 1 tablet (20 mg total) by mouth daily., Disp: 30 tablet, Rfl: 7   isosorbide mononitrate (IMDUR) 30 MG 24 hr tablet, Take 1 tablet (30 mg total) by mouth daily., Disp: 30 tablet, Rfl: 6   losartan (COZAAR) 25 MG tablet, Take 1 tablet (25 mg total) by mouth daily. (Patient not taking: Reported on 07/09/2022), Disp: 30 tablet, Rfl: 7   metoprolol succinate (TOPROL-XL) 100 MG 24 hr tablet, Take 1 tablet (100 mg total) by mouth daily. Take with or immediately following a meal., Disp: 30 tablet, Rfl: 7   sacubitril-valsartan (ENTRESTO) 24-26 MG, Take 1 tablet by mouth 2 (two)  times daily., Disp: 60 tablet, Rfl: 11   spironolactone (ALDACTONE) 25 MG tablet, Take 0.5 tablets (12.5 mg total) by mouth daily., Disp: 15 tablet, Rfl: 7   traMADol (ULTRAM) 50 MG tablet, Take 1 tablet (50 mg total) by mouth every 4 (four) hours as needed for moderate pain. (Patient not taking: Reported on 07/07/2022), Disp: 30 tablet, Rfl: 0  Past Medical History: Past Medical History:  Diagnosis Date   Arthritis    History of kidney stones    Hyperlipidemia    Hypertension    Nocturia    S/P CABG (coronary artery bypass graft)     Tobacco Use: Social History   Tobacco Use  Smoking Status Former   Packs/day: 1.00   Years: 15.00   Total pack years: 15.00   Types: Cigarettes   Quit date: 07/17/1975   Years since quitting: 47.0  Smokeless Tobacco Never    Labs: Review Flowsheet       Latest Ref Rng & Units 05/07/2022 05/08/2022 05/09/2022 05/23/2022  Labs for ITP Cardiac and Pulmonary Rehab  Hemoglobin A1c 4.8 - 5.6 % 6.3  - - -  PH, Arterial 7.35 - 7.45 - 7.388  7.297  7.361  7.388  7.395  7.408  7.463  7.450  7.365  7.401  7.362  7.431   PCO2 arterial 32 - 48 mmHg - 36.6  39.9  38.6  41.4  42.8  38.2  37.3  36.4  50.5  42.7  40.1  42.5   Bicarbonate 20.0 - 28.0 mmol/L - 22.1  19.5  22.1  24.9  26.2  24.1  26.8  25.3  26.8  28.9  26.5  22.8  28.2  29.2  29.5   TCO2 22 - 32 mmol/L - 23  21  23  26  25  28  25  24  28  26  25  28  30  26  28  27  24  30  30  31    Acid-base deficit 0.0 - 2.0 mmol/L - 3.0  7.0  3.0  2.0  -  O2 Saturation % - 97  98  98  100  100  100  100  100  85  100  100  97  96  64  67      Exercise Target Goals: Exercise Program Goal: Individual exercise prescription set using results from initial 6 min walk test and THRR while considering  patient's activity barriers and safety.   Exercise Prescription Goal: Initial exercise prescription builds to 30-45 minutes a day of aerobic activity, 2-3 days per week.  Home exercise guidelines will be given to  patient during program as part of exercise prescription that the participant will acknowledge.   Education: Aerobic Exercise: - Group verbal and visual presentation on the components of exercise prescription. Introduces F.I.T.T principle from ACSM for exercise prescriptions.  Reviews F.I.T.T. principles of aerobic exercise including progression. Written material given at graduation. Flowsheet Row Cardiac Rehab from 07/15/2022 in [facility] Cardiac and Pulmonary Rehab  Education need identified 07/15/22       Education: Resistance Exercise: - Group verbal and visual presentation on the components of exercise prescription. Introduces F.I.T.T principle from ACSM for exercise prescriptions  Reviews F.I.T.T. principles of resistance exercise including progression. Written material given at graduation.    Education: Exercise & Equipment Safety: - Individual verbal instruction and demonstration of equipment use and safety with use of the equipment. Flowsheet Row Cardiac Rehab from 07/15/2022 in [facility] Cardiac and Pulmonary Rehab  Date 07/15/22  Educator NT  Instruction Review Code 1- Verbalizes Understanding       Education: Exercise Physiology & General Exercise Guidelines: - Group verbal and written instruction with models to review the exercise physiology of the cardiovascular system and associated critical values. Provides general exercise guidelines with specific guidelines to those with heart or lung disease.    Education: Flexibility, Balance, Mind/Body Relaxation: - Group verbal and visual presentation with interactive activity on the components of exercise prescription. Introduces F.I.T.T principle from ACSM for exercise prescriptions. Reviews F.I.T.T. principles of flexibility and balance exercise training including progression. Also discusses the mind body connection.  Reviews various relaxation techniques to help reduce and manage stress (i.e. Deep breathing, progressive muscle relaxation,  and visualization). Balance handout provided to take home. Written material given at graduation.   Activity Barriers & Risk Stratification:  Activity Barriers & Cardiac Risk Stratification - 07/15/22 1005       Activity Barriers & Cardiac Risk Stratification   Activity Barriers Joint Problems;Left Knee Replacement;Right Knee Replacement;Shortness of Breath    Cardiac Risk Stratification High             6 Minute Walk:  6 Minute Walk     Row Name 07/15/22 1003         6 Minute Walk   Phase Initial     Distance 1260 feet     Walk  Time 6 minutes     # of Rest Breaks 0     MPH 2.39     METS 2.39     RPE 7     Perceived Dyspnea  1     VO2 Peak 8.35     Symptoms Yes (comment)     Comments Slight SOB     Resting HR 73 bpm     Resting BP 112/60     Resting Oxygen Saturation  99 %     Exercise Oxygen Saturation  during 6 min walk 95 %     Max Ex. HR 106 bpm     Max Ex. BP 120/72     2 Minute Post BP 114/70              Oxygen Initial Assessment:   Oxygen Re-Evaluation:   Oxygen Discharge (Final Oxygen Re-Evaluation):   Initial Exercise Prescription:  Initial Exercise Prescription - 07/15/22 1000       Date of Initial Exercise RX and Referring Provider   Date 07/15/22    Referring Provider Isaias Cowman MD      Oxygen   Maintain Oxygen Saturation 88% or higher      Treadmill   MPH 2    Grade 0.5    Minutes 15    METs 2.67      Recumbant Bike   Level 1    RPM 50    Watts 12    Minutes 15    METs 2.39      NuStep   Level 2    SPM 80    Minutes 15    METs 2.39      Recumbant Elliptical   Level 1.5    RPM 50    Minutes 15    METs 2.39      REL-XR   Level 1    Speed 50    Minutes 15    METs 2.39      Prescription Details   Frequency (times per week) 3    Duration Progress to 30 minutes of continuous aerobic without signs/symptoms of physical distress      Intensity   THRR 40-80% of Max Heartrate 99-125    Ratings of  Perceived Exertion 11-13    Perceived Dyspnea 0-4      Progression   Progression Continue to progress workloads to maintain intensity without signs/symptoms of physical distress.      Resistance Training   Training Prescription Yes    Weight 4 lb    Reps 10-15             Perform Capillary Blood Glucose checks as needed.  Exercise Prescription Changes:   Exercise Prescription Changes     Row Name 07/15/22 1000             Response to Exercise   Blood Pressure (Admit) 112/60       Blood Pressure (Exercise) 120/72       Blood Pressure (Exit) 114/70       Heart Rate (Admit) 73 bpm       Heart Rate (Exercise) 106 bpm       Heart Rate (Exit) 87 bpm       Oxygen Saturation (Admit) 99 %       Oxygen Saturation (Exercise) 95 %       Rating of Perceived Exertion (Exercise) 7       Perceived Dyspnea (Exercise) 1       Symptoms Slight  SOB       Comments 6MWT Results                Exercise Comments:   Exercise Goals and Review:   Exercise Goals     Row Name 07/15/22 1022             Exercise Goals   Increase Physical Activity Yes       Intervention Develop an individualized exercise prescription for aerobic and resistive training based on initial evaluation findings, risk stratification, comorbidities and participant's personal goals.;Provide advice, education, support and counseling about physical activity/exercise needs.       Expected Outcomes Long Term: Exercising regularly at least 3-5 days a week.;Long Term: Add in home exercise to make exercise part of routine and to increase amount of physical activity.;Short Term: Attend rehab on a regular basis to increase amount of physical activity.       Increase Strength and Stamina Yes       Intervention Develop an individualized exercise prescription for aerobic and resistive training based on initial evaluation findings, risk stratification, comorbidities and participant's personal goals.;Provide advice,  education, support and counseling about physical activity/exercise needs.       Expected Outcomes Long Term: Improve cardiorespiratory fitness, muscular endurance and strength as measured by increased METs and functional capacity (6MWT);Short Term: Increase workloads from initial exercise prescription for resistance, speed, and METs.;Short Term: Perform resistance training exercises routinely during rehab and add in resistance training at home       Able to understand and use rate of perceived exertion (RPE) scale Yes       Intervention Provide education and explanation on how to use RPE scale       Expected Outcomes Short Term: Able to use RPE daily in rehab to express subjective intensity level;Long Term:  Able to use RPE to guide intensity level when exercising independently       Able to understand and use Dyspnea scale Yes       Intervention Provide education and explanation on how to use Dyspnea scale       Expected Outcomes Long Term: Able to use Dyspnea scale to guide intensity level when exercising independently;Short Term: Able to use Dyspnea scale daily in rehab to express subjective sense of shortness of breath during exertion       Knowledge and understanding of Target Heart Rate Range (THRR) Yes       Intervention Provide education and explanation of THRR including how the numbers were predicted and where they are located for reference       Expected Outcomes Short Term: Able to state/look up THRR;Long Term: Able to use THRR to govern intensity when exercising independently;Short Term: Able to use daily as guideline for intensity in rehab       Able to check pulse independently Yes       Intervention Provide education and demonstration on how to check pulse in carotid and radial arteries.;Review the importance of being able to check your own pulse for safety during independent exercise       Expected Outcomes Short Term: Able to explain why pulse checking is important during independent  exercise;Long Term: Able to check pulse independently and accurately       Understanding of Exercise Prescription Yes       Intervention Provide education, explanation, and written materials on patient's individual exercise prescription       Expected Outcomes Short Term: Able to explain program exercise prescription;Long Term:  Able to explain home exercise prescription to exercise independently                Exercise Goals Re-Evaluation :   Discharge Exercise Prescription (Final Exercise Prescription Changes):  Exercise Prescription Changes - 07/15/22 1000       Response to Exercise   Blood Pressure (Admit) 112/60    Blood Pressure (Exercise) 120/72    Blood Pressure (Exit) 114/70    Heart Rate (Admit) 73 bpm    Heart Rate (Exercise) 106 bpm    Heart Rate (Exit) 87 bpm    Oxygen Saturation (Admit) 99 %    Oxygen Saturation (Exercise) 95 %    Rating of Perceived Exertion (Exercise) 7    Perceived Dyspnea (Exercise) 1    Symptoms Slight SOB    Comments 6MWT Results             Nutrition:  Target Goals: Understanding of nutrition guidelines, daily intake of sodium 1500mg , cholesterol 200mg , calories 30% from fat and 7% or less from saturated fats, daily to have 5 or more servings of fruits and vegetables.  Education: All About Nutrition: -Group instruction provided by verbal, written material, interactive activities, discussions, models, and posters to present general guidelines for heart healthy nutrition including fat, fiber, MyPlate, the role of sodium in heart healthy nutrition, utilization of the nutrition label, and utilization of this knowledge for meal planning. Follow up email sent as well. Written material given at graduation. Flowsheet Row Cardiac Rehab from 07/15/2022 in [facility] Cardiac and Pulmonary Rehab  Education need identified 07/15/22       Biometrics:  Pre Biometrics - 07/15/22 1023       Pre Biometrics   Height 6' 1.5" (1.867 m)    Weight 201  lb 12.8 oz (91.5 kg)    Waist Circumference 39 inches    Hip Circumference 41.5 inches    Waist to Hip Ratio 0.94 %    BMI (Calculated) 26.26    Single Leg Stand 7.1 seconds   L             Nutrition Therapy Plan and Nutrition Goals:  Nutrition Therapy & Goals - 07/15/22 0926       Nutrition Therapy   Diet Heart healthy, low Na    Drug/Food Interactions Statins/Certain Fruits      Personal Nutrition Goals   Comments 81 y.o. M admitted to cardiac rehab s/p CABG x5. PMHx includes HTN, HLD, arthritis, BPH, nephrolithiasis, CHF, CAD, TKR. Relevant medications includes lipitor, vit B12, jardiance, furosemide, MVI with minerals, tramadol. BMP 07/14/22 showed GFR 69mL/min, serum creatinine 1.69 mg/dL, and BUN 26 mg/dL.[name] Goodman reports no longer using the salt shaker or using many canned foods. [name] Goodman reports his eating patterns vary. B: rasin bran with toast and coffee or biscuit from [employer] L: nothing usually but will sometimes eat jello or fruit. Sometimes he will eat 1/2 sandwich. D: he reports cooking most meals: steak, hamburger (96% lean), pork chops, fish, chicken with vegetables (including mixed vegetables, salads, and broccoli) and potatoes. He also reports liking pasta and he looks at the sodium for the sauce. S:jello with fruit or cups of fruit. Drinks: coffee in am and water as well as cranberry juice (a couple of glasses). He also likes to snack on unsalted nuts during the day. He reports his weight has been stable 212 lbs before this CABG x5 and now he is 190-195 lbs; he feels some of this is fluid as he felt that they pulled  a lot of fluid off of him in the hospital. He reports his strength is about the same, but his energy has been lower - he walks 2 miles every morning and now will have to sit down and rest afterwards, previously he did not have to. He reports his significant other is invested in his health, but she was not able to come today. Discussed heart healthy and modest  changes to diet including increasing protein as his dinner is his main source during the day. Encouraged easier ways to include protein such as beans/lentils, peanut butter, greek yogurt, boiled eggs, or nutritional shake like boost or ensure as he eats smaller amounts of food.      Intervention Plan   Intervention Nutrition handout(s) given to patient.;Prescribe, educate and counsel regarding individualized specific dietary modifications aiming towards targeted core components such as weight, hypertension, lipid management, diabetes, heart failure and other comorbidities.    Expected Outcomes Short Term Goal: Understand basic principles of dietary content, such as calories, fat, sodium, cholesterol and nutrients.;Short Term Goal: A plan has been developed with personal nutrition goals set during dietitian appointment.;Long Term Goal: Adherence to prescribed nutrition plan.             Nutrition Assessments:  MEDIFICTS Score Key: ?70 Need to make dietary changes  40-70 Heart Healthy Diet ? 40 Therapeutic Level Cholesterol Diet  Flowsheet Row Cardiac Rehab from 07/15/2022 in [facility] Cardiac and Pulmonary Rehab  Picture Your Plate Total Score on Admission 68      Picture Your Plate Scores: [id number] Unhealthy dietary pattern with much room for improvement. 41-50 Dietary pattern unlikely to meet recommendations for good health and room for improvement. 51-60 More healthful dietary pattern, with some room for improvement.  >60 Healthy dietary pattern, although there may be some specific behaviors that could be improved.    Nutrition Goals Re-Evaluation:   Nutrition Goals Discharge (Final Nutrition Goals Re-Evaluation):   Psychosocial: Target Goals: Acknowledge presence or absence of significant depression and/or stress, maximize coping skills, provide positive support system. Participant is able to verbalize types and ability to use techniques and skills needed for reducing stress and  depression.   Education: Stress, Anxiety, and Depression - Group verbal and visual presentation to define topics covered.  Reviews how body is impacted by stress, anxiety, and depression.  Also discusses healthy ways to reduce stress and to treat/manage anxiety and depression.  Written material given at graduation.   Education: Sleep Hygiene -Provides group verbal and written instruction about how sleep can affect your health.  Define sleep hygiene, discuss sleep cycles and impact of sleep habits. Review good sleep hygiene tips.    Initial Review & Psychosocial Screening:  Initial Psych Review & Screening - 07/09/22 1017       Family Dynamics   Good Support System? --   has 3 children.  2 live in area, one in [place]            Quality of Life Scores:   Quality of Life - 07/15/22 0953       Quality of Life   Select Quality of Life      Quality of Life Scores   Health/Function Pre 26 %    Socioeconomic Pre 30 %    Psych/Spiritual Pre 30 %    Family Pre 26.4 %    GLOBAL Pre 27.64 %            Scores of 19 and below usually indicate a poorer quality  of life in these areas.  A difference of  2-3 points is a clinically meaningful difference.  A difference of 2-3 points in the total score of the Quality of Life Index has been associated with significant improvement in overall quality of life, self-image, physical symptoms, and general health in studies assessing change in quality of life.  PHQ-9: Review Flowsheet       07/15/2022  Depression screen PHQ 2/9  Decreased Interest 0  Down, Depressed, Hopeless 0  PHQ - 2 Score 0  Altered sleeping 0  Tired, decreased energy 1  Change in appetite 0  Feeling bad or failure about yourself  0  Trouble concentrating 0  Moving slowly or fidgety/restless 0  Suicidal thoughts 0  PHQ-9 Score 1  Difficult doing work/chores Not difficult at all   Interpretation of Total Score  Total Score Depression Severity:  1-4 = Minimal  depression, 5-9 = Mild depression, 10-14 = Moderate depression, 15-19 = Moderately severe depression, 20-27 = Severe depression   Psychosocial Evaluation and Intervention:  Psychosocial Evaluation - 07/09/22 1018       Psychosocial Evaluation & Interventions   Interventions Encouraged to exercise with the program and follow exercise prescription    Comments [name] Goodman has no barriers to attending the program. He lives alone. He does have a significant other , Alice, that has been with his 29 years. He also has 3 children;2 live in the area and one lives in [place]. He also has many friends for support. He wants to get back on the golf course.    Expected Outcomes [name] Goodman attends all scheduled sessions, he is able to progress with his exercise beyond the 2 miles he is already walking. He will return to playing golf. LTG [name] Goodman continues his exercise regimen and playing golf as he wishes    Continue Psychosocial Services  Follow up required by staff             Psychosocial Re-Evaluation:   Psychosocial Discharge (Final Psychosocial Re-Evaluation):   Vocational Rehabilitation: Provide vocational rehab assistance to qualifying candidates.   Vocational Rehab Evaluation & Intervention:  Vocational Rehab - 07/09/22 1024       Initial Vocational Rehab Evaluation & Intervention   Assessment shows need for Vocational Rehabilitation No      Vocational Rehab Re-Evaulation   Comments RETIRED             Education: Education Goals: Education classes will be provided on a variety of topics geared toward better understanding of heart health and risk factor modification. Participant will state understanding/return demonstration of topics presented as noted by education test scores.  Learning Barriers/Preferences:  Learning Barriers/Preferences - 07/09/22 1012       Learning Barriers/Preferences   Learning Barriers None    Learning Preferences None             General Cardiac  Education Topics:  AED/CPR: - Group verbal and written instruction with the use of models to demonstrate the basic use of the AED with the basic ABC's of resuscitation.   Anatomy and Cardiac Procedures: - Group verbal and visual presentation and models provide information about basic cardiac anatomy and function. Reviews the testing methods done to diagnose heart disease and the outcomes of the test results. Describes the treatment choices: Medical Management, Angioplasty, or Coronary Bypass Surgery for treating various heart conditions including Myocardial Infarction, Angina, Valve Disease, and Cardiac Arrhythmias.  Written material given at graduation. Flowsheet Row Cardiac Rehab from 07/15/2022 in [facility]  Cardiac and Pulmonary Rehab  Education need identified 07/15/22       Medication Safety: - Group verbal and visual instruction to review commonly prescribed medications for heart and lung disease. Reviews the medication, class of the drug, and side effects. Includes the steps to properly store meds and maintain the prescription regimen.  Written material given at graduation.   Intimacy: - Group verbal instruction through game format to discuss how heart and lung disease can affect sexual intimacy. Written material given at graduation..   Know Your Numbers and Heart Failure: - Group verbal and visual instruction to discuss disease risk factors for cardiac and pulmonary disease and treatment options.  Reviews associated critical values for Overweight/Obesity, Hypertension, Cholesterol, and Diabetes.  Discusses basics of heart failure: signs/symptoms and treatments.  Introduces Heart Failure Zone chart for action plan for heart failure.  Written material given at graduation.   Infection Prevention: - Provides verbal and written material to individual with discussion of infection control including proper hand washing and proper equipment cleaning during exercise session. Flowsheet Row  Cardiac Rehab from 07/15/2022 in [facility] Cardiac and Pulmonary Rehab  Date 07/15/22  Educator NT  Instruction Review Code 1- Verbalizes Understanding       Falls Prevention: - Provides verbal and written material to individual with discussion of falls prevention and safety. Flowsheet Row Cardiac Rehab from 07/15/2022 in [facility] Cardiac and Pulmonary Rehab  Date 07/09/22  Educator SB  Instruction Review Code 1- Verbalizes Understanding       Other: -Provides group and verbal instruction on various topics (see comments)   Knowledge Questionnaire Score:  Knowledge Questionnaire Score - 07/15/22 0956       Knowledge Questionnaire Score   Pre Score 21/26             Core Components/Risk Factors/Patient Goals at Admission:  Personal Goals and Risk Factors at Admission - 07/15/22 0956       Core Components/Risk Factors/Patient Goals on Admission    Weight Management Yes    Intervention Weight Management: Develop a combined nutrition and exercise program designed to reach desired caloric intake, while maintaining appropriate intake of nutrient and fiber, sodium and fats, and appropriate energy expenditure required for the weight goal.;Weight Management/Obesity: Establish reasonable short term and long term weight goals.;Weight Management: Provide education and appropriate resources to help participant work on and attain dietary goals.    Admit Weight 201 lb 12.8 oz (91.5 kg)    Goal Weight: Short Term 195 lb (88.5 kg)    Goal Weight: Long Term 190 lb (86.2 kg)    Expected Outcomes Short Term: Continue to assess and modify interventions until short term weight is achieved;Long Term: Adherence to nutrition and physical activity/exercise program aimed toward attainment of established weight goal;Weight Maintenance: Understanding of the daily nutrition guidelines, which includes 25-35% calories from fat, 7% or less cal from saturated fats, less than 200mg  cholesterol, less than 1.5gm of  sodium, & 5 or more servings of fruits and vegetables daily    Heart Failure Yes    Intervention Provide a combined exercise and nutrition program that is supplemented with education, support and counseling about heart failure. Directed toward relieving symptoms such as shortness of breath, decreased exercise tolerance, and extremity edema.    Expected Outcomes Improve functional capacity of life;Short term: Attendance in program 2-3 days a week with increased exercise capacity. Reported lower sodium intake. Reported increased fruit and vegetable intake. Reports medication compliance.;Short term: Daily weights obtained and reported for  increase. Utilizing diuretic protocols set by physician.;Long term: Adoption of self-care skills and reduction of barriers for early signs and symptoms recognition and intervention leading to self-care maintenance.    Hypertension Yes    Intervention Provide education on lifestyle modifcations including regular physical activity/exercise, weight management, moderate sodium restriction and increased consumption of fresh fruit, vegetables, and low fat dairy, alcohol moderation, and smoking cessation.;Monitor prescription use compliance.    Expected Outcomes Short Term: Continued assessment and intervention until BP is < 140/50mm HG in hypertensive participants. < 130/20mm HG in hypertensive participants with diabetes, heart failure or chronic kidney disease.;Long Term: Maintenance of blood pressure at goal levels.    Lipids Yes    Intervention Provide education and support for participant on nutrition & aerobic/resistive exercise along with prescribed medications to achieve LDL 70mg , HDL >40mg .    Expected Outcomes Short Term: Participant states understanding of desired cholesterol values and is compliant with medications prescribed. Participant is following exercise prescription and nutrition guidelines.;Long Term: Cholesterol controlled with medications as prescribed, with  individualized exercise RX and with personalized nutrition plan. Value goals: LDL < 70mg , HDL > 40 mg.             Education:Diabetes - Individual verbal and written instruction to review signs/symptoms of diabetes, desired ranges of glucose level fasting, after meals and with exercise. Acknowledge that pre and post exercise glucose checks will be done for 3 sessions at entry of program.   Core Components/Risk Factors/Patient Goals Review:    Core Components/Risk Factors/Patient Goals at Discharge (Final Review):    ITP Comments:  ITP Comments     Row Name 07/09/22 1024 07/15/22 0946         ITP Comments Virtual orientation call completed today. he has an appointment on Date: 07/15/2022  for EP eval and gym Orientation.  Documentation of diagnosis can be found in [facility] 05/18/2022 and 05/08/2022 . Completed 6MWT and gym orientation. Initial ITP created and sent for review to Dr. Emily Filbert, Medical Director.               Comments: Initial ITP

## 2022-07-15 NOTE — Patient Instructions (Signed)
Patient Instructions  Patient Details  Name: [name] Goodman MRN: [id number] Date of Birth: [birth year] Referring Provider:  Isaias Cowman, MD  Below are your personal goals for exercise, nutrition, and risk factors. Our goal is to help you stay on track towards obtaining and maintaining these goals. We will be discussing your progress on these goals with you throughout the program.  Initial Exercise Prescription:  Initial Exercise Prescription - 07/15/22 1000       Date of Initial Exercise RX and Referring Provider   Date 07/15/22    Referring Provider Isaias Cowman MD      Oxygen   Maintain Oxygen Saturation 88% or higher      Treadmill   MPH 2    Grade 0.5    Minutes 15    METs 2.67      Recumbant Bike   Level 1    RPM 50    Watts 12    Minutes 15    METs 2.39      NuStep   Level 2    SPM 80    Minutes 15    METs 2.39      Recumbant Elliptical   Level 1.5    RPM 50    Minutes 15    METs 2.39      REL-XR   Level 1    Speed 50    Minutes 15    METs 2.39      Prescription Details   Frequency (times per week) 3    Duration Progress to 30 minutes of continuous aerobic without signs/symptoms of physical distress      Intensity   THRR 40-80% of Max Heartrate 99-125    Ratings of Perceived Exertion 11-13    Perceived Dyspnea 0-4      Progression   Progression Continue to progress workloads to maintain intensity without signs/symptoms of physical distress.      Resistance Training   Training Prescription Yes    Weight 4 lb    Reps 10-15             Exercise Goals: Frequency: Be able to perform aerobic exercise two to three times per week in program working toward 2-5 days per week of home exercise.  Intensity: Work with a perceived exertion of 11 (fairly light) - 15 (hard) while following your exercise prescription.  We will make changes to your prescription with you as you progress through the program.   Duration: Be able to  do 30 to 45 minutes of continuous aerobic exercise in addition to a 5 minute warm-up and a 5 minute cool-down routine.   Nutrition Goals: Your personal nutrition goals will be established when you do your nutrition analysis with the dietician.  The following are general nutrition guidelines to follow: Cholesterol < 200mg /day Sodium < 1500mg /day Fiber: Men over 50 yrs - 30 grams per day  Personal Goals:  Personal Goals and Risk Factors at Admission - 07/15/22 0956       Core Components/Risk Factors/Patient Goals on Admission    Weight Management Yes    Intervention Weight Management: Develop a combined nutrition and exercise program designed to reach desired caloric intake, while maintaining appropriate intake of nutrient and fiber, sodium and fats, and appropriate energy expenditure required for the weight goal.;Weight Management/Obesity: Establish reasonable short term and long term weight goals.;Weight Management: Provide education and appropriate resources to help participant work on and attain dietary goals.    Admit Weight 201 lb 12.8 oz (  91.5 kg)    Goal Weight: Short Term 195 lb (88.5 kg)    Goal Weight: Long Term 190 lb (86.2 kg)    Expected Outcomes Short Term: Continue to assess and modify interventions until short term weight is achieved;Long Term: Adherence to nutrition and physical activity/exercise program aimed toward attainment of established weight goal;Weight Maintenance: Understanding of the daily nutrition guidelines, which includes 25-35% calories from fat, 7% or less cal from saturated fats, less than 200mg  cholesterol, less than 1.5gm of sodium, & 5 or more servings of fruits and vegetables daily    Heart Failure Yes    Intervention Provide a combined exercise and nutrition program that is supplemented with education, support and counseling about heart failure. Directed toward relieving symptoms such as shortness of breath, decreased exercise tolerance, and extremity  edema.    Expected Outcomes Improve functional capacity of life;Short term: Attendance in program 2-3 days a week with increased exercise capacity. Reported lower sodium intake. Reported increased fruit and vegetable intake. Reports medication compliance.;Short term: Daily weights obtained and reported for increase. Utilizing diuretic protocols set by physician.;Long term: Adoption of self-care skills and reduction of barriers for early signs and symptoms recognition and intervention leading to self-care maintenance.    Hypertension Yes    Intervention Provide education on lifestyle modifcations including regular physical activity/exercise, weight management, moderate sodium restriction and increased consumption of fresh fruit, vegetables, and low fat dairy, alcohol moderation, and smoking cessation.;Monitor prescription use compliance.    Expected Outcomes Short Term: Continued assessment and intervention until BP is < 140/56mm HG in hypertensive participants. < 130/47mm HG in hypertensive participants with diabetes, heart failure or chronic kidney disease.;Long Term: Maintenance of blood pressure at goal levels.    Lipids Yes    Intervention Provide education and support for participant on nutrition & aerobic/resistive exercise along with prescribed medications to achieve LDL 70mg , HDL >40mg .    Expected Outcomes Short Term: Participant states understanding of desired cholesterol values and is compliant with medications prescribed. Participant is following exercise prescription and nutrition guidelines.;Long Term: Cholesterol controlled with medications as prescribed, with individualized exercise RX and with personalized nutrition plan. Value goals: LDL < 70mg , HDL > 40 mg.             Tobacco Use Initial Evaluation: Social History   Tobacco Use  Smoking Status Former   Packs/day: 1.00   Years: 15.00   Total pack years: 15.00   Types: Cigarettes   Quit date: 07/17/1975   Years since  quitting: 47.0  Smokeless Tobacco Never    Exercise Goals and Review:  Exercise Goals     Row Name 07/15/22 1022             Exercise Goals   Increase Physical Activity Yes       Intervention Develop an individualized exercise prescription for aerobic and resistive training based on initial evaluation findings, risk stratification, comorbidities and participant's personal goals.;Provide advice, education, support and counseling about physical activity/exercise needs.       Expected Outcomes Long Term: Exercising regularly at least 3-5 days a week.;Long Term: Add in home exercise to make exercise part of routine and to increase amount of physical activity.;Short Term: Attend rehab on a regular basis to increase amount of physical activity.       Increase Strength and Stamina Yes       Intervention Develop an individualized exercise prescription for aerobic and resistive training based on initial evaluation findings, risk stratification, comorbidities and  participant's personal goals.;Provide advice, education, support and counseling about physical activity/exercise needs.       Expected Outcomes Long Term: Improve cardiorespiratory fitness, muscular endurance and strength as measured by increased METs and functional capacity (6MWT);Short Term: Increase workloads from initial exercise prescription for resistance, speed, and METs.;Short Term: Perform resistance training exercises routinely during rehab and add in resistance training at home       Able to understand and use rate of perceived exertion (RPE) scale Yes       Intervention Provide education and explanation on how to use RPE scale       Expected Outcomes Short Term: Able to use RPE daily in rehab to express subjective intensity level;Long Term:  Able to use RPE to guide intensity level when exercising independently       Able to understand and use Dyspnea scale Yes       Intervention Provide education and explanation on how to use  Dyspnea scale       Expected Outcomes Long Term: Able to use Dyspnea scale to guide intensity level when exercising independently;Short Term: Able to use Dyspnea scale daily in rehab to express subjective sense of shortness of breath during exertion       Knowledge and understanding of Target Heart Rate Range (THRR) Yes       Intervention Provide education and explanation of THRR including how the numbers were predicted and where they are located for reference       Expected Outcomes Short Term: Able to state/look up THRR;Long Term: Able to use THRR to govern intensity when exercising independently;Short Term: Able to use daily as guideline for intensity in rehab       Able to check pulse independently Yes       Intervention Provide education and demonstration on how to check pulse in carotid and radial arteries.;Review the importance of being able to check your own pulse for safety during independent exercise       Expected Outcomes Short Term: Able to explain why pulse checking is important during independent exercise;Long Term: Able to check pulse independently and accurately       Understanding of Exercise Prescription Yes       Intervention Provide education, explanation, and written materials on patient's individual exercise prescription       Expected Outcomes Short Term: Able to explain program exercise prescription;Long Term: Able to explain home exercise prescription to exercise independently

## 2022-07-17 DIAGNOSIS — I10 Essential (primary) hypertension: Secondary | ICD-10-CM | POA: Diagnosis not present

## 2022-07-17 DIAGNOSIS — Z951 Presence of aortocoronary bypass graft: Secondary | ICD-10-CM | POA: Diagnosis not present

## 2022-07-17 DIAGNOSIS — Z955 Presence of coronary angioplasty implant and graft: Secondary | ICD-10-CM | POA: Diagnosis not present

## 2022-07-17 DIAGNOSIS — E7800 Pure hypercholesterolemia, unspecified: Secondary | ICD-10-CM | POA: Diagnosis not present

## 2022-07-17 DIAGNOSIS — Z23 Encounter for immunization: Secondary | ICD-10-CM | POA: Diagnosis not present

## 2022-07-17 DIAGNOSIS — I5022 Chronic systolic (congestive) heart failure: Secondary | ICD-10-CM | POA: Diagnosis not present

## 2022-07-21 ENCOUNTER — Encounter: Payer: PPO | Admitting: *Deleted

## 2022-07-21 DIAGNOSIS — Z955 Presence of coronary angioplasty implant and graft: Secondary | ICD-10-CM

## 2022-07-21 DIAGNOSIS — Z951 Presence of aortocoronary bypass graft: Secondary | ICD-10-CM

## 2022-07-21 NOTE — Progress Notes (Signed)
Daily Session Note  Patient Details  Name: [name] Goodman MRN: [id number] Date of Birth: Dec 26, 1940 Referring Provider:   Flowsheet Row Cardiac Rehab from 07/15/2022 in [facility] Cardiac and Pulmonary Rehab  Referring Provider Isaias Cowman MD       Encounter Date: 07/21/2022  Check In:  Session Check In - 07/21/22 1287       Check-In   Supervising physician immediately available to respond to emergencies See telemetry face sheet for immediately available ER MD    Location [facility]-Cardiac & Pulmonary Rehab    Staff Present Antionette Fairy, BS, Exercise Physiologist;Kelly Amedeo Plenty, BS, ACSM CEP, Exercise Physiologist;Evette Diclemente Tamala Julian, RN, ADN    Virtual Visit No    Medication changes reported     No    Fall or balance concerns reported    No    Warm-up and Cool-down Performed on first and last piece of equipment    Resistance Training Performed Yes    VAD Patient? No    PAD/SET Patient? No      Pain Assessment   Currently in Pain? No/denies                Social History   Tobacco Use  Smoking Status Former   Packs/day: 1.00   Years: 15.00   Total pack years: 15.00   Types: Cigarettes   Quit date: 07/17/1975   Years since quitting: 47.0  Smokeless Tobacco Never    Goals Met:  Independence with exercise equipment Exercise tolerated well No report of concerns or symptoms today Strength training completed today  Goals Unmet:  Not Applicable  Comments: First full day of exercise!  Patient was oriented to gym and equipment including functions, settings, policies, and procedures.  Patient's individual exercise prescription and treatment plan were reviewed.  All starting workloads were established based on the results of the 6 minute walk test done at initial orientation visit.  The plan for exercise progression was also introduced and progression will be customized based on patient's performance and goals.    Dr. Emily Filbert is Medical Director for [place].  Dr. Ottie Glazier is Medical Director for [facility] Pulmonary Rehabilitation.

## 2022-07-23 ENCOUNTER — Encounter: Payer: Self-pay | Admitting: *Deleted

## 2022-07-23 ENCOUNTER — Encounter: Payer: PPO | Attending: Cardiology | Admitting: *Deleted

## 2022-07-23 DIAGNOSIS — Z955 Presence of coronary angioplasty implant and graft: Secondary | ICD-10-CM

## 2022-07-23 DIAGNOSIS — Z951 Presence of aortocoronary bypass graft: Secondary | ICD-10-CM | POA: Diagnosis not present

## 2022-07-23 DIAGNOSIS — Z48812 Encounter for surgical aftercare following surgery on the circulatory system: Secondary | ICD-10-CM | POA: Insufficient documentation

## 2022-07-23 NOTE — Progress Notes (Signed)
Daily Session Note  Patient Details  Name: [name] Goodman MRN: [id number] Date of Birth: 01-24-1941 Referring Provider:   Flowsheet Row Cardiac Rehab from 07/15/2022 in [facility] Cardiac and Pulmonary Rehab  Referring Provider Isaias Cowman MD       Encounter Date: 07/23/2022  Check In:  Session Check In - 07/23/22 1005       Check-In   Staff Present Antionette Fairy, BS, Exercise Physiologist;Joseph [place], Ernestina Patches, RN, [place]    Virtual Visit No    Medication changes reported     No    Fall or balance concerns reported    No    Warm-up and Cool-down Performed on first and last piece of equipment    Resistance Training Performed Yes    VAD Patient? No    PAD/SET Patient? No      Pain Assessment   Currently in Pain? No/denies                Social History   Tobacco Use  Smoking Status Former   Packs/day: 1.00   Years: 15.00   Total pack years: 15.00   Types: Cigarettes   Quit date: 07/17/1975   Years since quitting: 47.0  Smokeless Tobacco Never    Goals Met:  Independence with exercise equipment Exercise tolerated well No report of concerns or symptoms today Strength training completed today  Goals Unmet:  Not Applicable  Comments: Pt able to follow exercise prescription today without complaint.  Will continue to monitor for progression.    Dr. Emily Filbert is Medical Director for [place].  Dr. Ottie Glazier is Medical Director for [facility] Pulmonary Rehabilitation.

## 2022-07-23 NOTE — Progress Notes (Signed)
Cardiac Individual Treatment Plan  Patient Details  Name: [name] Goodman MRN: [id number] Date of Birth: March 23, 1941 Referring Provider:   Flowsheet Row Cardiac Rehab from 07/15/2022 in [facility] Cardiac and Pulmonary Rehab  Referring Provider Isaias Cowman MD       Initial Encounter Date:  Flowsheet Row Cardiac Rehab from 07/15/2022 in [facility] Cardiac and Pulmonary Rehab  Date 07/15/22       Visit Diagnosis: Status post coronary artery stent placement  S/P CABG x 5  Patient's Home Medications on Admission:  Current Outpatient Medications:    amiodarone (PACERONE) 200 MG tablet, Amiodarone 200 mg 1 tablet twice daily for 2 weeks then decrease it to 200 mg 1 tablet daily, Disp: 60 tablet, Rfl: 6   apixaban (ELIQUIS) 5 MG TABS tablet, Take 1 tablet (5 mg total) by mouth 2 (two) times daily., Disp: 60 tablet, Rfl: 6   atorvastatin (LIPITOR) 80 MG tablet, Take 1 tablet (80 mg total) by mouth daily., Disp: 30 tablet, Rfl: 7   clopidogrel (PLAVIX) 75 MG tablet, Take 1 tablet (75 mg total) by mouth daily., Disp: 30 tablet, Rfl: 7   cyanocobalamin (VITAMIN B12) 1000 MCG tablet, Take 1,000 mcg by mouth daily. (Patient not taking: Reported on 07/09/2022), Disp: , Rfl:    empagliflozin (JARDIANCE) 10 MG TABS tablet, Take 1 tablet (10 mg total) by mouth daily., Disp: 30 tablet, Rfl: 7   furosemide (LASIX) 20 MG tablet, Take 1 tablet (20 mg total) by mouth daily., Disp: 30 tablet, Rfl: 7   isosorbide mononitrate (IMDUR) 30 MG 24 hr tablet, Take 1 tablet (30 mg total) by mouth daily., Disp: 30 tablet, Rfl: 6   losartan (COZAAR) 25 MG tablet, Take 1 tablet (25 mg total) by mouth daily. (Patient not taking: Reported on 07/09/2022), Disp: 30 tablet, Rfl: 7   metoprolol succinate (TOPROL-XL) 100 MG 24 hr tablet, Take 1 tablet (100 mg total) by mouth daily. Take with or immediately following a meal., Disp: 30 tablet, Rfl: 7   sacubitril-valsartan (ENTRESTO) 24-26 MG, Take 1 tablet by mouth 2 (two)  times daily., Disp: 60 tablet, Rfl: 11   spironolactone (ALDACTONE) 25 MG tablet, Take 0.5 tablets (12.5 mg total) by mouth daily., Disp: 15 tablet, Rfl: 7   traMADol (ULTRAM) 50 MG tablet, Take 1 tablet (50 mg total) by mouth every 4 (four) hours as needed for moderate pain. (Patient not taking: Reported on 07/07/2022), Disp: 30 tablet, Rfl: 0  Past Medical History: Past Medical History:  Diagnosis Date   Arthritis    History of kidney stones    Hyperlipidemia    Hypertension    Nocturia    S/P CABG (coronary artery bypass graft)     Tobacco Use: Social History   Tobacco Use  Smoking Status Former   Packs/day: 1.00   Years: 15.00   Total pack years: 15.00   Types: Cigarettes   Quit date: 07/17/1975   Years since quitting: 47.0  Smokeless Tobacco Never    Labs: Review Flowsheet       Latest Ref Rng & Units 05/07/2022 05/08/2022 05/09/2022 05/23/2022  Labs for ITP Cardiac and Pulmonary Rehab  Hemoglobin A1c 4.8 - 5.6 % 6.3  - - -  PH, Arterial 7.35 - 7.45 - 7.388  7.297  7.361  7.388  7.395  7.408  7.463  7.450  7.365  7.401  7.362  7.431   PCO2 arterial 32 - 48 mmHg - 36.6  39.9  38.6  41.4  42.8  38.2  37.3  36.4  50.5  42.7  40.1  42.5   Bicarbonate 20.0 - 28.0 mmol/L - 22.1  19.5  22.1  24.9  26.2  24.1  26.8  25.3  26.8  28.9  26.5  22.8  28.2  29.2  29.5   TCO2 22 - 32 mmol/L - 23  21  23  26  25  28  25  24  28  26  25  28  30  26  28  27  24  30  30  31    Acid-base deficit 0.0 - 2.0 mmol/L - 3.0  7.0  3.0  2.0  -  O2 Saturation % - 97  98  98  100  100  100  100  100  85  100  100  97  96  64  67      Exercise Target Goals: Exercise Program Goal: Individual exercise prescription set using results from initial 6 min walk test and THRR while considering  patient's activity barriers and safety.   Exercise Prescription Goal: Initial exercise prescription builds to 30-45 minutes a day of aerobic activity, 2-3 days per week.  Home exercise guidelines will be given to  patient during program as part of exercise prescription that the participant will acknowledge.   Education: Aerobic Exercise: - Group verbal and visual presentation on the components of exercise prescription. Introduces F.I.T.T principle from ACSM for exercise prescriptions.  Reviews F.I.T.T. principles of aerobic exercise including progression. Written material given at graduation. Flowsheet Row Cardiac Rehab from 07/15/2022 in [facility] Cardiac and Pulmonary Rehab  Education need identified 07/15/22       Education: Resistance Exercise: - Group verbal and visual presentation on the components of exercise prescription. Introduces F.I.T.T principle from ACSM for exercise prescriptions  Reviews F.I.T.T. principles of resistance exercise including progression. Written material given at graduation.    Education: Exercise & Equipment Safety: - Individual verbal instruction and demonstration of equipment use and safety with use of the equipment. Flowsheet Row Cardiac Rehab from 07/15/2022 in [facility] Cardiac and Pulmonary Rehab  Date 07/15/22  Educator NT  Instruction Review Code 1- Verbalizes Understanding       Education: Exercise Physiology & General Exercise Guidelines: - Group verbal and written instruction with models to review the exercise physiology of the cardiovascular system and associated critical values. Provides general exercise guidelines with specific guidelines to those with heart or lung disease.    Education: Flexibility, Balance, Mind/Body Relaxation: - Group verbal and visual presentation with interactive activity on the components of exercise prescription. Introduces F.I.T.T principle from ACSM for exercise prescriptions. Reviews F.I.T.T. principles of flexibility and balance exercise training including progression. Also discusses the mind body connection.  Reviews various relaxation techniques to help reduce and manage stress (i.e. Deep breathing, progressive muscle relaxation,  and visualization). Balance handout provided to take home. Written material given at graduation.   Activity Barriers & Risk Stratification:  Activity Barriers & Cardiac Risk Stratification - 07/15/22 1005       Activity Barriers & Cardiac Risk Stratification   Activity Barriers Joint Problems;Left Knee Replacement;Right Knee Replacement;Shortness of Breath    Cardiac Risk Stratification High             6 Minute Walk:  6 Minute Walk     Row Name 07/15/22 1003         6 Minute Walk   Phase Initial     Distance 1260 feet     Walk  Time 6 minutes     # of Rest Breaks 0     MPH 2.39     METS 2.39     RPE 7     Perceived Dyspnea  1     VO2 Peak 8.35     Symptoms Yes (comment)     Comments Slight SOB     Resting HR 73 bpm     Resting BP 112/60     Resting Oxygen Saturation  99 %     Exercise Oxygen Saturation  during 6 min walk 95 %     Max Ex. HR 106 bpm     Max Ex. BP 120/72     2 Minute Post BP 114/70              Oxygen Initial Assessment:   Oxygen Re-Evaluation:   Oxygen Discharge (Final Oxygen Re-Evaluation):   Initial Exercise Prescription:  Initial Exercise Prescription - 07/15/22 1000       Date of Initial Exercise RX and Referring Provider   Date 07/15/22    Referring Provider Isaias Cowman MD      Oxygen   Maintain Oxygen Saturation 88% or higher      Treadmill   MPH 2    Grade 0.5    Minutes 15    METs 2.67      Recumbant Bike   Level 1    RPM 50    Watts 12    Minutes 15    METs 2.39      NuStep   Level 2    SPM 80    Minutes 15    METs 2.39      Recumbant Elliptical   Level 1.5    RPM 50    Minutes 15    METs 2.39      REL-XR   Level 1    Speed 50    Minutes 15    METs 2.39      Prescription Details   Frequency (times per week) 3    Duration Progress to 30 minutes of continuous aerobic without signs/symptoms of physical distress      Intensity   THRR 40-80% of Max Heartrate 99-125    Ratings of  Perceived Exertion 11-13    Perceived Dyspnea 0-4      Progression   Progression Continue to progress workloads to maintain intensity without signs/symptoms of physical distress.      Resistance Training   Training Prescription Yes    Weight 4 lb    Reps 10-15             Perform Capillary Blood Glucose checks as needed.  Exercise Prescription Changes:   Exercise Prescription Changes     Row Name 07/15/22 1000             Response to Exercise   Blood Pressure (Admit) 112/60       Blood Pressure (Exercise) 120/72       Blood Pressure (Exit) 114/70       Heart Rate (Admit) 73 bpm       Heart Rate (Exercise) 106 bpm       Heart Rate (Exit) 87 bpm       Oxygen Saturation (Admit) 99 %       Oxygen Saturation (Exercise) 95 %       Rating of Perceived Exertion (Exercise) 7       Perceived Dyspnea (Exercise) 1       Symptoms Slight  SOB       Comments 6MWT Results                Exercise Comments:   Exercise Comments     Row Name 07/21/22 [phone]           Exercise Comments First full day of exercise!  Patient was oriented to gym and equipment including functions, settings, policies, and procedures.  Patient's individual exercise prescription and treatment plan were reviewed.  All starting workloads were established based on the results of the 6 minute walk test done at initial orientation visit.  The plan for exercise progression was also introduced and progression will be customized based on patient's performance and goals.                Exercise Goals and Review:   Exercise Goals     Row Name 07/15/22 1022             Exercise Goals   Increase Physical Activity Yes       Intervention Develop an individualized exercise prescription for aerobic and resistive training based on initial evaluation findings, risk stratification, comorbidities and participant's personal goals.;Provide advice, education, support and counseling about physical  activity/exercise needs.       Expected Outcomes Long Term: Exercising regularly at least 3-5 days a week.;Long Term: Add in home exercise to make exercise part of routine and to increase amount of physical activity.;Short Term: Attend rehab on a regular basis to increase amount of physical activity.       Increase Strength and Stamina Yes       Intervention Develop an individualized exercise prescription for aerobic and resistive training based on initial evaluation findings, risk stratification, comorbidities and participant's personal goals.;Provide advice, education, support and counseling about physical activity/exercise needs.       Expected Outcomes Long Term: Improve cardiorespiratory fitness, muscular endurance and strength as measured by increased METs and functional capacity (6MWT);Short Term: Increase workloads from initial exercise prescription for resistance, speed, and METs.;Short Term: Perform resistance training exercises routinely during rehab and add in resistance training at home       Able to understand and use rate of perceived exertion (RPE) scale Yes       Intervention Provide education and explanation on how to use RPE scale       Expected Outcomes Short Term: Able to use RPE daily in rehab to express subjective intensity level;Long Term:  Able to use RPE to guide intensity level when exercising independently       Able to understand and use Dyspnea scale Yes       Intervention Provide education and explanation on how to use Dyspnea scale       Expected Outcomes Long Term: Able to use Dyspnea scale to guide intensity level when exercising independently;Short Term: Able to use Dyspnea scale daily in rehab to express subjective sense of shortness of breath during exertion       Knowledge and understanding of Target Heart Rate Range (THRR) Yes       Intervention Provide education and explanation of THRR including how the numbers were predicted and where they are located for  reference       Expected Outcomes Short Term: Able to state/look up THRR;Long Term: Able to use THRR to govern intensity when exercising independently;Short Term: Able to use daily as guideline for intensity in rehab       Able to check pulse independently Yes  Intervention Provide education and demonstration on how to check pulse in carotid and radial arteries.;Review the importance of being able to check your own pulse for safety during independent exercise       Expected Outcomes Short Term: Able to explain why pulse checking is important during independent exercise;Long Term: Able to check pulse independently and accurately       Understanding of Exercise Prescription Yes       Intervention Provide education, explanation, and written materials on patient's individual exercise prescription       Expected Outcomes Short Term: Able to explain program exercise prescription;Long Term: Able to explain home exercise prescription to exercise independently                Exercise Goals Re-Evaluation :  Exercise Goals Re-Evaluation     Row Name 07/21/22 [id number]             Exercise Goal Re-Evaluation   Exercise Goals Review Able to understand and use rate of perceived exertion (RPE) scale;Able to understand and use Dyspnea scale;Knowledge and understanding of Target Heart Rate Range (THRR);Understanding of Exercise Prescription       Comments Reviewed RPE scale, THR and program prescription with pt today.  Pt voiced understanding and was given a copy of goals to take home.       Expected Outcomes Short: Use RPE daily to regulate intensity.  Long: Follow program prescription in THR.                Discharge Exercise Prescription (Final Exercise Prescription Changes):  Exercise Prescription Changes - 07/15/22 1000       Response to Exercise   Blood Pressure (Admit) 112/60    Blood Pressure (Exercise) 120/72    Blood Pressure (Exit) 114/70    Heart Rate (Admit) 73 bpm    Heart  Rate (Exercise) 106 bpm    Heart Rate (Exit) 87 bpm    Oxygen Saturation (Admit) 99 %    Oxygen Saturation (Exercise) 95 %    Rating of Perceived Exertion (Exercise) 7    Perceived Dyspnea (Exercise) 1    Symptoms Slight SOB    Comments 6MWT Results             Nutrition:  Target Goals: Understanding of nutrition guidelines, daily intake of sodium 1500mg , cholesterol 200mg , calories 30% from fat and 7% or less from saturated fats, daily to have 5 or more servings of fruits and vegetables.  Education: All About Nutrition: -Group instruction provided by verbal, written material, interactive activities, discussions, models, and posters to present general guidelines for heart healthy nutrition including fat, fiber, MyPlate, the role of sodium in heart healthy nutrition, utilization of the nutrition label, and utilization of this knowledge for meal planning. Follow up email sent as well. Written material given at graduation. Flowsheet Row Cardiac Rehab from 07/15/2022 in [facility] Cardiac and Pulmonary Rehab  Education need identified 07/15/22       Biometrics:  Pre Biometrics - 07/15/22 1023       Pre Biometrics   Height 6' 1.5" (1.867 m)    Weight 201 lb 12.8 oz (91.5 kg)    Waist Circumference 39 inches    Hip Circumference 41.5 inches    Waist to Hip Ratio 0.94 %    BMI (Calculated) 26.26    Single Leg Stand 7.1 seconds   L             Nutrition Therapy Plan and Nutrition Goals:  Nutrition Therapy &  Goals - 07/15/22 0926       Nutrition Therapy   Diet Heart healthy, low Na    Drug/Food Interactions Statins/Certain Fruits    Protein (specify units) 90-100g    Fiber 28 grams    Whole Grain Foods 3 servings    Saturated Fats 16 max. grams    Fruits and Vegetables 8 servings/day   8 ideal, but he eats smaller amounts of food during the day.   Sodium 1.5 grams      Personal Nutrition Goals   Nutrition Goal ST: consistenly eat at least 1 serving of non-starcy  vegetables at dinner time, continue to read food labels for sodium, include at least 1-2 additional sources of protein for the day (easier examples inlcudes beans/lentils, peanut butter, greek yogurt, boiled eggs, or nutritional shake like boost or ensure) LT: prevent fluid retention by keeping Na <1.5-2g/day, meet protein and calorie needs, include at least 1 fruit/ non-starchy vegetable each meal    Comments 81 y.o. M admitted to cardiac rehab s/p CABG x5. PMHx includes HTN, HLD, arthritis, BPH, nephrolithiasis, CHF, CAD, TKR. Relevant medications includes lipitor, vit B12, jardiance, furosemide, MVI with minerals, tramadol. BMP 07/14/22 showed GFR 26mL/min, serum creatinine 1.69 mg/dL, and BUN 26 mg/dL.Bill reports no longer using the salt shaker or using many canned foods. Bill reports his eating patterns vary. B: rasin bran with toast and coffee or biscuit from [employer] L: nothing usually but will sometimes eat jello or fruit. Sometimes he will eat 1/2 sandwich. D: he reports cooking most meals: steak, hamburger (96% lean), pork chops, fish, chicken with vegetables (including mixed vegetables, salads, and broccoli) and potatoes. He feels he should eat more vegetables as he doesn't always like cooking for one person - suggested frozen vegetables as an option. He also reports liking pasta and he looks at the sodium for the sauce. S:jello with fruit or cups of fruit. Drinks: coffee in am and water as well as cranberry juice (a couple of glasses). He also likes to snack on unsalted nuts during the day. He reports his weight has been stable 212 lbs before this CABG x5 and now he is 190-195 lbs; he feels some of this is fluid as he felt that they pulled a lot of fluid off of him in the hospital. He reports his strength is about the same, but his energy has been lower - he walks 2 miles every morning and now will have to sit down and rest afterwards, previously he did not have to. He reports his significant other  is invested in his health, but she was not able to come today. Discussed heart healthy and modest changes to diet including increasing protein as his dinner is his main source during the day. Encouraged easier ways to include protein such as beans/lentils, peanut butter, greek yogurt, boiled eggs, or nutritional shake like boost or ensure as he eats smaller amounts of food.      Intervention Plan   Intervention Nutrition handout(s) given to patient.;Prescribe, educate and counsel regarding individualized specific dietary modifications aiming towards targeted core components such as weight, hypertension, lipid management, diabetes, heart failure and other comorbidities.    Expected Outcomes Short Term Goal: Understand basic principles of dietary content, such as calories, fat, sodium, cholesterol and nutrients.;Short Term Goal: A plan has been developed with personal nutrition goals set during dietitian appointment.;Long Term Goal: Adherence to prescribed nutrition plan.             Nutrition Assessments:  MEDIFICTS Score Key: ?70 Need to make dietary changes  40-70 Heart Healthy Diet ? 40 Therapeutic Level Cholesterol Diet  Flowsheet Row Cardiac Rehab from 07/15/2022 in [facility] Cardiac and Pulmonary Rehab  Picture Your Plate Total Score on Admission 68      Picture Your Plate Scores: [id number] Unhealthy dietary pattern with much room for improvement. 41-50 Dietary pattern unlikely to meet recommendations for good health and room for improvement. 51-60 More healthful dietary pattern, with some room for improvement.  >60 Healthy dietary pattern, although there may be some specific behaviors that could be improved.    Nutrition Goals Re-Evaluation:   Nutrition Goals Discharge (Final Nutrition Goals Re-Evaluation):   Psychosocial: Target Goals: Acknowledge presence or absence of significant depression and/or stress, maximize coping skills, provide positive support system. Participant is able  to verbalize types and ability to use techniques and skills needed for reducing stress and depression.   Education: Stress, Anxiety, and Depression - Group verbal and visual presentation to define topics covered.  Reviews how body is impacted by stress, anxiety, and depression.  Also discusses healthy ways to reduce stress and to treat/manage anxiety and depression.  Written material given at graduation.   Education: Sleep Hygiene -Provides group verbal and written instruction about how sleep can affect your health.  Define sleep hygiene, discuss sleep cycles and impact of sleep habits. Review good sleep hygiene tips.    Initial Review & Psychosocial Screening:  Initial Psych Review & Screening - 07/09/22 1017       Family Dynamics   Good Support System? --   has 3 children.  2 live in area, one in [place]            Quality of Life Scores:   Quality of Life - 07/15/22 0953       Quality of Life   Select Quality of Life      Quality of Life Scores   Health/Function Pre 26 %    Socioeconomic Pre 30 %    Psych/Spiritual Pre 30 %    Family Pre 26.4 %    GLOBAL Pre 27.64 %            Scores of 19 and below usually indicate a poorer quality of life in these areas.  A difference of  2-3 points is a clinically meaningful difference.  A difference of 2-3 points in the total score of the Quality of Life Index has been associated with significant improvement in overall quality of life, self-image, physical symptoms, and general health in studies assessing change in quality of life.  PHQ-9: Review Flowsheet       07/15/2022  Depression screen PHQ 2/9  Decreased Interest 0  Down, Depressed, Hopeless 0  PHQ - 2 Score 0  Altered sleeping 0  Tired, decreased energy 1  Change in appetite 0  Feeling bad or failure about yourself  0  Trouble concentrating 0  Moving slowly or fidgety/restless 0  Suicidal thoughts 0  PHQ-9 Score 1  Difficult doing work/chores Not difficult at all    Interpretation of Total Score  Total Score Depression Severity:  1-4 = Minimal depression, 5-9 = Mild depression, 10-14 = Moderate depression, 15-19 = Moderately severe depression, 20-27 = Severe depression   Psychosocial Evaluation and Intervention:  Psychosocial Evaluation - 07/09/22 1018       Psychosocial Evaluation & Interventions   Interventions Encouraged to exercise with the program and follow exercise prescription    Comments [name] has no barriers  to attending the program. He lives alone. He does have a significant other , Alice, that has been with his 29 years. He also has 3 children;2 live in the area and one lives in [place]. He also has many friends for support. He wants to get back on the golf course.    Expected Outcomes Bill attends all scheduled sessions, he is able to progress with his exercise beyond the 2 miles he is already walking. He will return to playing golf. LTG Bill continues his exercise regimen and playing golf as he wishes    Continue Psychosocial Services  Follow up required by staff             Psychosocial Re-Evaluation:   Psychosocial Discharge (Final Psychosocial Re-Evaluation):   Vocational Rehabilitation: Provide vocational rehab assistance to qualifying candidates.   Vocational Rehab Evaluation & Intervention:  Vocational Rehab - 07/09/22 1024       Initial Vocational Rehab Evaluation & Intervention   Assessment shows need for Vocational Rehabilitation No      Vocational Rehab Re-Evaulation   Comments RETIRED             Education: Education Goals: Education classes will be provided on a variety of topics geared toward better understanding of heart health and risk factor modification. Participant will state understanding/return demonstration of topics presented as noted by education test scores.  Learning Barriers/Preferences:  Learning Barriers/Preferences - 07/09/22 1012       Learning Barriers/Preferences   Learning  Barriers None    Learning Preferences None             General Cardiac Education Topics:  AED/CPR: - Group verbal and written instruction with the use of models to demonstrate the basic use of the AED with the basic ABC's of resuscitation.   Anatomy and Cardiac Procedures: - Group verbal and visual presentation and models provide information about basic cardiac anatomy and function. Reviews the testing methods done to diagnose heart disease and the outcomes of the test results. Describes the treatment choices: Medical Management, Angioplasty, or Coronary Bypass Surgery for treating various heart conditions including Myocardial Infarction, Angina, Valve Disease, and Cardiac Arrhythmias.  Written material given at graduation. Flowsheet Row Cardiac Rehab from 07/15/2022 in [facility] Cardiac and Pulmonary Rehab  Education need identified 07/15/22       Medication Safety: - Group verbal and visual instruction to review commonly prescribed medications for heart and lung disease. Reviews the medication, class of the drug, and side effects. Includes the steps to properly store meds and maintain the prescription regimen.  Written material given at graduation.   Intimacy: - Group verbal instruction through game format to discuss how heart and lung disease can affect sexual intimacy. Written material given at graduation..   Know Your Numbers and Heart Failure: - Group verbal and visual instruction to discuss disease risk factors for cardiac and pulmonary disease and treatment options.  Reviews associated critical values for Overweight/Obesity, Hypertension, Cholesterol, and Diabetes.  Discusses basics of heart failure: signs/symptoms and treatments.  Introduces Heart Failure Zone chart for action plan for heart failure.  Written material given at graduation.   Infection Prevention: - Provides verbal and written material to individual with discussion of infection control including proper hand  washing and proper equipment cleaning during exercise session. Flowsheet Row Cardiac Rehab from 07/15/2022 in [facility] Cardiac and Pulmonary Rehab  Date 07/15/22  Educator NT  Instruction Review Code 1- Verbalizes Understanding       Falls Prevention: -  Provides verbal and written material to individual with discussion of falls prevention and safety. Flowsheet Row Cardiac Rehab from 07/15/2022 in [facility] Cardiac and Pulmonary Rehab  Date 07/09/22  Educator SB  Instruction Review Code 1- Verbalizes Understanding       Other: -Provides group and verbal instruction on various topics (see comments)   Knowledge Questionnaire Score:  Knowledge Questionnaire Score - 07/15/22 0956       Knowledge Questionnaire Score   Pre Score 21/26             Core Components/Risk Factors/Patient Goals at Admission:  Personal Goals and Risk Factors at Admission - 07/15/22 0956       Core Components/Risk Factors/Patient Goals on Admission    Weight Management Yes    Intervention Weight Management: Develop a combined nutrition and exercise program designed to reach desired caloric intake, while maintaining appropriate intake of nutrient and fiber, sodium and fats, and appropriate energy expenditure required for the weight goal.;Weight Management/Obesity: Establish reasonable short term and long term weight goals.;Weight Management: Provide education and appropriate resources to help participant work on and attain dietary goals.    Admit Weight 201 lb 12.8 oz (91.5 kg)    Goal Weight: Short Term 195 lb (88.5 kg)    Goal Weight: Long Term 190 lb (86.2 kg)    Expected Outcomes Short Term: Continue to assess and modify interventions until short term weight is achieved;Long Term: Adherence to nutrition and physical activity/exercise program aimed toward attainment of established weight goal;Weight Maintenance: Understanding of the daily nutrition guidelines, which includes 25-35% calories from fat, 7% or  less cal from saturated fats, less than 200mg  cholesterol, less than 1.5gm of sodium, & 5 or more servings of fruits and vegetables daily    Heart Failure Yes    Intervention Provide a combined exercise and nutrition program that is supplemented with education, support and counseling about heart failure. Directed toward relieving symptoms such as shortness of breath, decreased exercise tolerance, and extremity edema.    Expected Outcomes Improve functional capacity of life;Short term: Attendance in program 2-3 days a week with increased exercise capacity. Reported lower sodium intake. Reported increased fruit and vegetable intake. Reports medication compliance.;Short term: Daily weights obtained and reported for increase. Utilizing diuretic protocols set by physician.;Long term: Adoption of self-care skills and reduction of barriers for early signs and symptoms recognition and intervention leading to self-care maintenance.    Hypertension Yes    Intervention Provide education on lifestyle modifcations including regular physical activity/exercise, weight management, moderate sodium restriction and increased consumption of fresh fruit, vegetables, and low fat dairy, alcohol moderation, and smoking cessation.;Monitor prescription use compliance.    Expected Outcomes Short Term: Continued assessment and intervention until BP is < 140/56mm HG in hypertensive participants. < 130/66mm HG in hypertensive participants with diabetes, heart failure or chronic kidney disease.;Long Term: Maintenance of blood pressure at goal levels.    Lipids Yes    Intervention Provide education and support for participant on nutrition & aerobic/resistive exercise along with prescribed medications to achieve LDL 70mg , HDL >40mg .    Expected Outcomes Short Term: Participant states understanding of desired cholesterol values and is compliant with medications prescribed. Participant is following exercise prescription and nutrition  guidelines.;Long Term: Cholesterol controlled with medications as prescribed, with individualized exercise RX and with personalized nutrition plan. Value goals: LDL < 70mg , HDL > 40 mg.             Education:Diabetes - Individual verbal and written instruction  to review signs/symptoms of diabetes, desired ranges of glucose level fasting, after meals and with exercise. Acknowledge that pre and post exercise glucose checks will be done for 3 sessions at entry of program.   Core Components/Risk Factors/Patient Goals Review:    Core Components/Risk Factors/Patient Goals at Discharge (Final Review):    ITP Comments:  ITP Comments     Row Name 07/09/22 1024 07/15/22 0946 07/21/22 0953       ITP Comments Virtual orientation call completed today. he has an appointment on Date: 07/15/2022  for EP eval and gym Orientation.  Documentation of diagnosis can be found in [facility] 05/18/2022 and 05/08/2022 . Completed 6MWT and gym orientation. Initial ITP created and sent for review to Dr. Emily Filbert, Medical Director. First full day of exercise!  Patient was oriented to gym and equipment including functions, settings, policies, and procedures.  Patient's individual exercise prescription and treatment plan were reviewed.  All starting workloads were established based on the results of the 6 minute walk test done at initial orientation visit.  The plan for exercise progression was also introduced and progression will be customized based on patient's performance and goals.              Comments:

## 2022-07-24 ENCOUNTER — Encounter: Payer: PPO | Admitting: *Deleted

## 2022-07-24 DIAGNOSIS — Z955 Presence of coronary angioplasty implant and graft: Secondary | ICD-10-CM | POA: Diagnosis not present

## 2022-07-24 DIAGNOSIS — Z951 Presence of aortocoronary bypass graft: Secondary | ICD-10-CM

## 2022-07-24 NOTE — Progress Notes (Signed)
Daily Session Note  Patient Details  Name: [name] Goodman MRN: [id number] Date of Birth: December 29, 1940 Referring Provider:   Flowsheet Row Cardiac Rehab from 07/15/2022 in [facility] Cardiac and Pulmonary Rehab  Referring Provider Isaias Cowman MD       Encounter Date: 07/24/2022  Check In:  Session Check In - 07/24/22 0944       Check-In   Supervising physician immediately available to respond to emergencies See telemetry face sheet for immediately available ER MD    Location [facility]-Cardiac & Pulmonary Rehab    Staff Present Coralie Keens, MS, ASCM CEP, Exercise Physiologist;Joseph Rosebud Poles, RN, [place]    Virtual Visit No    Medication changes reported     No    Fall or balance concerns reported    No    Warm-up and Cool-down Performed on first and last piece of equipment    Resistance Training Performed Yes    VAD Patient? No    PAD/SET Patient? No      Pain Assessment   Currently in Pain? No/denies                Social History   Tobacco Use  Smoking Status Former   Packs/day: 1.00   Years: 15.00   Total pack years: 15.00   Types: Cigarettes   Quit date: 07/17/1975   Years since quitting: 47.0  Smokeless Tobacco Never    Goals Met:  Independence with exercise equipment Exercise tolerated well No report of concerns or symptoms today Strength training completed today  Goals Unmet:  Not Applicable  Comments: Pt able to follow exercise prescription today without complaint.  Will continue to monitor for progression.    Dr. Emily Filbert is Medical Director for [place].  Dr. Ottie Glazier is Medical Director for [facility] Pulmonary Rehabilitation.

## 2022-07-28 ENCOUNTER — Encounter: Payer: PPO | Admitting: *Deleted

## 2022-07-28 DIAGNOSIS — Z955 Presence of coronary angioplasty implant and graft: Secondary | ICD-10-CM

## 2022-07-28 DIAGNOSIS — Z951 Presence of aortocoronary bypass graft: Secondary | ICD-10-CM

## 2022-07-28 NOTE — Progress Notes (Signed)
Daily Session Note  Patient Details  Name: WILLIAN DONSON MRN: [id number] Date of Birth: October 22, 1940 Referring Provider:   Flowsheet Row Cardiac Rehab from 07/15/2022 in [facility] Cardiac and Pulmonary Rehab  Referring Provider Isaias Cowman MD       Encounter Date: 07/28/2022  Check In:  Session Check In - 07/28/22 0945       Check-In   Supervising physician immediately available to respond to emergencies See telemetry face sheet for immediately available ER MD    Location [facility]-Cardiac & Pulmonary Rehab    Staff Present Earlean Shawl, BS, ACSM CEP, Exercise Physiologist;Noah Tickle, BS, Exercise Physiologist;Notnamed Croucher Tamala Julian, RN, ADN    Virtual Visit No    Medication changes reported     No    Fall or balance concerns reported    No    Warm-up and Cool-down Performed on first and last piece of equipment    Resistance Training Performed Yes    VAD Patient? No    PAD/SET Patient? No      Pain Assessment   Currently in Pain? No/denies                Social History   Tobacco Use  Smoking Status Former   Packs/day: 1.00   Years: 15.00   Total pack years: 15.00   Types: Cigarettes   Quit date: 07/17/1975   Years since quitting: 47.0  Smokeless Tobacco Never    Goals Met:  Independence with exercise equipment Exercise tolerated well No report of concerns or symptoms today Strength training completed today  Goals Unmet:  Not Applicable  Comments: Pt able to follow exercise prescription today without complaint.  Will continue to monitor for progression.    Dr. Emily Filbert is Medical Director for [place].  Dr. Ottie Glazier is Medical Director for [facility] Pulmonary Rehabilitation.

## 2022-07-30 ENCOUNTER — Encounter: Payer: PPO | Admitting: *Deleted

## 2022-07-30 DIAGNOSIS — Z955 Presence of coronary angioplasty implant and graft: Secondary | ICD-10-CM | POA: Diagnosis not present

## 2022-07-30 DIAGNOSIS — Z951 Presence of aortocoronary bypass graft: Secondary | ICD-10-CM

## 2022-07-30 NOTE — Progress Notes (Signed)
Daily Session Note  Patient Details  Name: NICO SYME MRN: [id number] Date of Birth: 1941/08/03 Referring Provider:   Flowsheet Row Cardiac Rehab from 07/15/2022 in [facility] Cardiac and Pulmonary Rehab  Referring Provider Isaias Cowman MD       Encounter Date: 07/30/2022  Check In:  Session Check In - 07/30/22 1111       Check-In   Supervising physician immediately available to respond to emergencies See telemetry face sheet for immediately available ER MD    Location [facility]-Cardiac & Pulmonary Rehab    Staff Present Justin Mend, RCP,RRT,BSRT;Luvenia Cranford Sherryll Burger, RN BSN;Noah Tickle, BS, Exercise Physiologist;Megan Tamala Julian, RN, ADN    Virtual Visit No    Medication changes reported     No    Fall or balance concerns reported    No    Warm-up and Cool-down Performed on first and last piece of equipment    Resistance Training Performed Yes    VAD Patient? No    PAD/SET Patient? No      Pain Assessment   Currently in Pain? No/denies                Social History   Tobacco Use  Smoking Status Former   Packs/day: 1.00   Years: 15.00   Total pack years: 15.00   Types: Cigarettes   Quit date: 07/17/1975   Years since quitting: 47.0  Smokeless Tobacco Never    Goals Met:  Independence with exercise equipment Exercise tolerated well No report of concerns or symptoms today Strength training completed today  Goals Unmet:  Not Applicable  Comments: Pt able to follow exercise prescription today without complaint.  Will continue to monitor for progression.    Dr. Emily Filbert is Medical Director for [place].  Dr. Ottie Glazier is Medical Director for [facility] Pulmonary Rehabilitation.

## 2022-07-31 ENCOUNTER — Encounter: Payer: PPO | Admitting: *Deleted

## 2022-07-31 DIAGNOSIS — Z955 Presence of coronary angioplasty implant and graft: Secondary | ICD-10-CM | POA: Diagnosis not present

## 2022-07-31 DIAGNOSIS — Z951 Presence of aortocoronary bypass graft: Secondary | ICD-10-CM

## 2022-07-31 NOTE — Progress Notes (Signed)
Daily Session Note  Patient Details  Name: [name] Goodman MRN: [id number] Date of Birth: 1941/05/27 Referring Provider:   Flowsheet Row Cardiac Rehab from 07/15/2022 in [facility] Cardiac and Pulmonary Rehab  Referring Provider Isaias Cowman MD       Encounter Date: 07/31/2022  Check In:  Session Check In - 07/31/22 1103       Check-In   Supervising physician immediately available to respond to emergencies See telemetry face sheet for immediately available ER MD    Location [facility]-Cardiac & Pulmonary Rehab    Staff Present Coralie Keens, MS, ASCM CEP, Exercise Physiologist;Meredith Sherryll Burger, RN BSN;Joseph Rosebud Poles, RN, [place]    Virtual Visit No    Medication changes reported     No    Fall or balance concerns reported    No    Warm-up and Cool-down Performed on first and last piece of equipment    Resistance Training Performed Yes    VAD Patient? No    PAD/SET Patient? No      Pain Assessment   Currently in Pain? No/denies                Social History   Tobacco Use  Smoking Status Former   Packs/day: 1.00   Years: 15.00   Total pack years: 15.00   Types: Cigarettes   Quit date: 07/17/1975   Years since quitting: 47.0  Smokeless Tobacco Never    Goals Met:  Independence with exercise equipment Exercise tolerated well No report of concerns or symptoms today Strength training completed today  Goals Unmet:  Not Applicable  Comments: Pt able to follow exercise prescription today without complaint.  Will continue to monitor for progression.    Dr. Emily Filbert is Medical Director for [place].  Dr. Ottie Glazier is Medical Director for [facility] Pulmonary Rehabilitation.

## 2022-08-05 ENCOUNTER — Encounter: Payer: PPO | Admitting: *Deleted

## 2022-08-05 DIAGNOSIS — Z955 Presence of coronary angioplasty implant and graft: Secondary | ICD-10-CM | POA: Diagnosis not present

## 2022-08-05 DIAGNOSIS — Z951 Presence of aortocoronary bypass graft: Secondary | ICD-10-CM

## 2022-08-05 NOTE — Progress Notes (Signed)
Daily Session Note  Patient Details  Name: [name] Goodman MRN: [id number] Date of Birth: 1940-12-20 Referring Provider:   Flowsheet Row Cardiac Rehab from 07/15/2022 in [facility] Cardiac and Pulmonary Rehab  Referring Provider Isaias Cowman MD       Encounter Date: 08/05/2022  Check In:  Session Check In - 08/05/22 1045       Check-In   Supervising physician immediately available to respond to emergencies See telemetry face sheet for immediately available ER MD    Location [facility]-Cardiac & Pulmonary Rehab    Staff Present Heath Lark, RN, BSN, CCRP;Jessica [place], MA, RCEP, CCRP, Marylynn Pearson, MS, ASCM CEP, Exercise Physiologist    Virtual Visit No    Medication changes reported     No    Fall or balance concerns reported    No    Warm-up and Cool-down Performed on first and last piece of equipment    Resistance Training Performed Yes    VAD Patient? No    PAD/SET Patient? No      Pain Assessment   Currently in Pain? No/denies                Social History   Tobacco Use  Smoking Status Former   Packs/day: 1.00   Years: 15.00   Total pack years: 15.00   Types: Cigarettes   Quit date: 07/17/1975   Years since quitting: 47.0  Smokeless Tobacco Never    Goals Met:  Independence with exercise equipment Exercise tolerated well No report of concerns or symptoms today  Goals Unmet:  Not Applicable  Comments: Pt able to follow exercise prescription today without complaint.  Will continue to monitor for progression.    Dr. Emily Filbert is Medical Director for [place].  Dr. Ottie Glazier is Medical Director for [facility] Pulmonary Rehabilitation.

## 2022-08-06 ENCOUNTER — Encounter (HOSPITAL_COMMUNITY): Payer: Self-pay

## 2022-08-06 ENCOUNTER — Telehealth (HOSPITAL_COMMUNITY): Payer: Self-pay | Admitting: Surgery

## 2022-08-06 ENCOUNTER — Other Ambulatory Visit (HOSPITAL_COMMUNITY): Payer: Self-pay

## 2022-08-06 ENCOUNTER — Ambulatory Visit (HOSPITAL_COMMUNITY)
Admission: RE | Admit: 2022-08-06 | Discharge: 2022-08-06 | Disposition: A | Discharge status: Home / Self Care | Payer: PPO | Source: Ambulatory Visit | Attending: Family Medicine | Admitting: Family Medicine

## 2022-08-06 ENCOUNTER — Inpatient Hospital Stay (HOSPITAL_COMMUNITY)
Admission: RE | Admit: 2022-08-06 | Discharge: 2022-08-06 | Disposition: A | Discharge status: Home / Self Care | Payer: PPO | Source: Ambulatory Visit | Attending: Internal Medicine | Admitting: Internal Medicine

## 2022-08-06 VITALS — BP 124/84 | HR 78 | Wt 195.8 lb

## 2022-08-06 DIAGNOSIS — I4510 Unspecified right bundle-branch block: Secondary | ICD-10-CM | POA: Insufficient documentation

## 2022-08-06 DIAGNOSIS — I493 Ventricular premature depolarization: Secondary | ICD-10-CM | POA: Diagnosis not present

## 2022-08-06 DIAGNOSIS — Z955 Presence of coronary angioplasty implant and graft: Secondary | ICD-10-CM | POA: Insufficient documentation

## 2022-08-06 DIAGNOSIS — I10 Essential (primary) hypertension: Secondary | ICD-10-CM | POA: Diagnosis not present

## 2022-08-06 DIAGNOSIS — I5022 Chronic systolic (congestive) heart failure: Secondary | ICD-10-CM

## 2022-08-06 DIAGNOSIS — I130 Hypertensive heart and chronic kidney disease with heart failure and stage 1 through stage 4 chronic kidney disease, or unspecified chronic kidney disease: Secondary | ICD-10-CM | POA: Diagnosis not present

## 2022-08-06 DIAGNOSIS — I2694 Multiple subsegmental pulmonary emboli without acute cor pulmonale: Secondary | ICD-10-CM | POA: Diagnosis not present

## 2022-08-06 DIAGNOSIS — Z86711 Personal history of pulmonary embolism: Secondary | ICD-10-CM | POA: Diagnosis not present

## 2022-08-06 DIAGNOSIS — I2510 Atherosclerotic heart disease of native coronary artery without angina pectoris: Secondary | ICD-10-CM | POA: Diagnosis not present

## 2022-08-06 DIAGNOSIS — J90 Pleural effusion, not elsewhere classified: Secondary | ICD-10-CM | POA: Insufficient documentation

## 2022-08-06 DIAGNOSIS — Z87891 Personal history of nicotine dependence: Secondary | ICD-10-CM | POA: Insufficient documentation

## 2022-08-06 DIAGNOSIS — I480 Paroxysmal atrial fibrillation: Secondary | ICD-10-CM | POA: Diagnosis not present

## 2022-08-06 DIAGNOSIS — Z9889 Other specified postprocedural states: Secondary | ICD-10-CM | POA: Insufficient documentation

## 2022-08-06 DIAGNOSIS — Z7902 Long term (current) use of antithrombotics/antiplatelets: Secondary | ICD-10-CM | POA: Insufficient documentation

## 2022-08-06 DIAGNOSIS — Z79899 Other long term (current) drug therapy: Secondary | ICD-10-CM | POA: Diagnosis not present

## 2022-08-06 DIAGNOSIS — I2584 Coronary atherosclerosis due to calcified coronary lesion: Secondary | ICD-10-CM | POA: Diagnosis not present

## 2022-08-06 DIAGNOSIS — Z951 Presence of aortocoronary bypass graft: Secondary | ICD-10-CM | POA: Diagnosis not present

## 2022-08-06 DIAGNOSIS — Z7901 Long term (current) use of anticoagulants: Secondary | ICD-10-CM | POA: Diagnosis not present

## 2022-08-06 DIAGNOSIS — N183 Chronic kidney disease, stage 3 unspecified: Secondary | ICD-10-CM | POA: Diagnosis not present

## 2022-08-06 LAB — COMPREHENSIVE METABOLIC PANEL
ALT: 19 U/L (ref 0–44)
AST: 15 U/L (ref 15–41)
Albumin: 4.1 g/dL (ref 3.5–5.0)
Alkaline Phosphatase: 66 U/L (ref 38–126)
Anion gap: 9 (ref 5–15)
BUN: 30 mg/dL — ABNORMAL HIGH (ref 8–23)
CO2: 25 mmol/L (ref 22–32)
Calcium: 9.3 mg/dL (ref 8.9–10.3)
Chloride: 106 mmol/L (ref 98–111)
Creatinine, Ser: 1.99 mg/dL — ABNORMAL HIGH (ref 0.61–1.24)
GFR, Estimated: 33 mL/min — ABNORMAL LOW (ref >60)
Glucose, Bld: 105 mg/dL — ABNORMAL HIGH (ref 70–99)
Potassium: 4.4 mmol/L (ref 3.5–5.1)
Sodium: 140 mmol/L (ref 135–145)
Total Bilirubin: 0.9 mg/dL (ref 0.3–1.2)
Total Protein: 7 g/dL (ref 6.5–8.1)

## 2022-08-06 LAB — TSH: TSH: 2.212 u[IU]/mL (ref 0.350–4.500)

## 2022-08-06 MED ORDER — FUROSEMIDE 20 MG PO TABS: ORAL_TABLET | ORAL | 6 refills | Status: DC

## 2022-08-06 NOTE — Telephone Encounter (Signed)
-----   Message from Jacklynn Ganong, [place] sent at 08/06/2022  3:39 PM EST ----- Kidney function elevated, suggests dehydration.  Decrease lasix to 40 mg daily alternating with 20 mg every other day. Repeat BMET in 10 days.

## 2022-08-06 NOTE — Telephone Encounter (Signed)
I attempted to reach patient to review results and recommendations per provider.  I left a message for a return call. 

## 2022-08-06 NOTE — Progress Notes (Signed)
Medication Samples have been provided to the patient.  Drug name: entresto       Strength: 24/26mg          Qty: 56  LOT: [id number]  Exp.Date: 04/2024  Dosing instructions: ONE TAB TWICE A DAY  The patient has been instructed regarding the correct time, dose, and frequency of taking this medication, including desired effects and most common side effects.   Magda Bernheim M 12:11 PM 08/06/2022   Medication Samples have been provided to the patient.  Drug name: Jardiance       Strength: 10 mg        Qty: 28  LOT: [id number]  Exp.Date: 07/2024  Dosing instructions: one tab daily  The patient has been instructed regarding the correct time, dose, and frequency of taking this medication, including desired effects and most common side effects.   Theresia Bough 12:11 PM 08/06/2022

## 2022-08-06 NOTE — Progress Notes (Addendum)
ADVANCED HF CLINIC NOTE   Primary Care: Maryland Pink, MD Primary Cardiologist: Dr. Saralyn Pilar HF Cardiologist: Dr. Haroldine Laws  HPI: [name] Goodman is a 81 y.o.male with history of CAD s/p CABG 08/23, HFmEF, atrial fibrillation, HTN, HLD.   Patient underwent CABG X 5 on 05/08/22. Intra-op TEE with LVEF 40-45%. Had postoperative atrial fibrillation and developed bradycardia on amiodarone drip. He was started on cardizem and Eliquis and converted to SR prior to discharge.    He was readmitted on 05/18/22 with segmental and subsegmental PE right middle and upper lobes, CHF, left pleural effusion s/p thoracentesis and recurrent atrial fibrillation. Echo 05/19/22: EF 25-30%, paradoxical septal motion consistent with postop status, mildly reduced RV. He was diuresed with IV lasix and GDMT titrated. Cardiazem stopped d/t reduced EF and transitioned to metoprolol. Converted to SR during admit. PVCs also noted on tele. Had early graft failure with occlusion of SVG to Diagonal and SVG to RCA occluded. LIMA to LAD and radial jump graft to OM and RPLV patent. Underwent successful staged PCI/DES to distal RCA and PCI/DES p Cx into OM1.   Seen in TOC, NYHA II and volume stable. Remained in NSR and spiro 12.5 added and referred to AHF.  Zio 9/23 showed AF/AFL burden 84% with PVC burden of 18.6%. Started on amiodarone.  Follow up 10/23, stable NYHA II-early III and volume OK. Losartan stopped, Entresto 24/26 started, spiro 12.5 restarted and lasix decreased to 20 mg daily. Lasix later increased to 40 mg daily at general cardiology follow up due to increase dyspnea.   Today he returns for HF follow up with his partner. Overall feeling fine. Doing cardiac rehab twice a week and no dyspnea with exercises. Partner says he has some balance issues when he stands, [name] Goodman says he gets unsteady if he stands too quickly, but no lightheadedness or falls. Denies palpitations, abnormal bleeding, CP, edema, or  PND/Orthopnea. Appetite ok. No fever or chills. Weight at home 195 pounds. Taking all medications. Wants to reduce pill burden, asking if he can stop some of his meds.   Cardiac Studies  - Zio 2 week (9/23): mostly AF/AFL (84%) burden, PVCs (18.6% burden), 589 runs of NSVT  - Echo (05/18/22): EF 25-30%, paradoxical septal motion consistent with postop status, mildly reduced RV.   - Intra-op TEE (05/08/22): EF 40-45%  Past Medical History:  Diagnosis Date   Arthritis    History of kidney stones    Hyperlipidemia    Hypertension    Nocturia    S/P CABG (coronary artery bypass graft)    Current Outpatient Medications  Medication Sig Dispense Refill   amiodarone (PACERONE) 200 MG tablet Take 200 mg by mouth daily.     apixaban (ELIQUIS) 5 MG TABS tablet Take 1 tablet (5 mg total) by mouth 2 (two) times daily. 60 tablet 6   atorvastatin (LIPITOR) 80 MG tablet Take 1 tablet (80 mg total) by mouth daily. 30 tablet 7   clopidogrel (PLAVIX) 75 MG tablet Take 1 tablet (75 mg total) by mouth daily. 30 tablet 7   cyanocobalamin (VITAMIN B12) 1000 MCG tablet Take 1,000 mcg by mouth daily.     empagliflozin (JARDIANCE) 10 MG TABS tablet Take 1 tablet (10 mg total) by mouth daily. 30 tablet 7   furosemide (LASIX) 40 MG tablet Take 40 mg by mouth daily.     isosorbide mononitrate (IMDUR) 30 MG 24 hr tablet Take 1 tablet (30 mg total) by mouth daily. 30 tablet 6   metoprolol  succinate (TOPROL-XL) 100 MG 24 hr tablet Take 1 tablet (100 mg total) by mouth daily. Take with or immediately following a meal. 30 tablet 7   sacubitril-valsartan (ENTRESTO) 24-26 MG Take 1 tablet by mouth 2 (two) times daily. 60 tablet 11   spironolactone (ALDACTONE) 25 MG tablet Take 0.5 tablets (12.5 mg total) by mouth daily. 15 tablet 7   traMADol (ULTRAM) 50 MG tablet Take 1 tablet (50 mg total) by mouth every 4 (four) hours as needed for moderate pain. 30 tablet 0   No current facility-administered medications for this  encounter.   No Known Allergies  Social History   Socioeconomic History   Marital status: Divorced    Spouse name: Not on file   Number of children: 3   Years of education: Not on file   Highest education level: Master's degree (e.g., MA, MS, MEng, MEd, MSW, MBA)  Occupational History   Occupation: retired Psychologist, occupational at [employer]  Tobacco Use   Smoking status: Former    Packs/day: 1.00    Years: 15.00    Total pack years: 15.00    Types: Cigarettes    Quit date: 07/17/1975    Years since quitting: 47.0   Smokeless tobacco: Never  Vaping Use   Vaping Use: Never used  Substance and Sexual Activity   Alcohol use: No   Drug use: No   Sexual activity: Not on file  Other Topics Concern   Not on file  Social History Narrative   Lives alone   Social Determinants of Health   Financial Resource Strain: Low Risk  (05/20/2022)   Overall Financial Resource Strain (CARDIA)    Difficulty of Paying Living Expenses: Not hard at all  Food Insecurity: No Food Insecurity (05/20/2022)   Hunger Vital Sign    Worried About Running Out of Food in the Last Year: Never true    Ran Out of Food in the Last Year: Never true  Transportation Needs: No Transportation Needs (05/20/2022)   PRAPARE - Administrator, Civil Service (Medical): No    Lack of Transportation (Non-Medical): No  Physical Activity: Not on file  Stress: Not on file  Social Connections: Not on file  Intimate Partner Violence: Not on file   Family History  Problem Relation Age of Onset   Hypertension Father    Cancer Father    BP 124/84   Pulse 78   Wt 88.8 kg (195 lb 12.8 oz)   SpO2 99%   BMI 25.48 kg/m   Wt Readings from Last 3 Encounters:  08/06/22 88.8 kg (195 lb 12.8 oz)  07/15/22 91.5 kg (201 lb 12.8 oz)  07/07/22 91 kg (200 lb 9.6 oz)   PHYSICAL EXAM: General:  NAD. No resp difficulty, walked into clinic HEENT: Normal Neck: Supple. No JVD. Carotids 2+ bilat; no bruits. No lymphadenopathy or thryomegaly  appreciated. Cor: PMI nondisplaced. Regular rate & rhythm. No rubs, gallops or murmurs. Lungs: Clear Abdomen: Soft, nontender, nondistended. No hepatosplenomegaly. No bruits or masses. Good bowel sounds. Extremities: No cyanosis, clubbing, rash, edema Neuro: Alert & oriented x 3, cranial nerves grossly intact. Moves all 4 extremities w/o difficulty. Affect pleasant.  ECG: atrial fibrillation vs atypical AFL w/ RBBB, PVC  82 bpm (personally reviewed)  ASSESSMENT & PLAN: Chronic systolic CHF - pre-CABG LV gram (7/23): EF 35-40%  - Intraop TEE 05/08/22: EF 40-45% - Limited echo 05/19/22: EF 25-30%, RV mildly reduced - S/p PCI/DES to RCA and PCI/DES to p Lcx into  OM1 (see below).  - suspect primarily iCM. Had early graft failure post CABG. Vs PVC CM (see #4). - NYHA II. Volume looks good on exam. - Continue Entresto 24/26 mg bid. Samples given today. - Continue spiro 12.5 mg daily.  - Continue Lasix 40 mg daily. - Continue Jardiance 10 mg daily. Samples given today. - Continue Toprol XL 100 mg daily. - Continue CR. - Labs today. - Repeat echo next month.   2. CAD - S/p CABG X 5 (LIMA to LAD, SVG to PDA, SVG to D1, sequential left radial artery to OM and PL) 05/08/22 - Early graft failure with occlusion of SVG to D1 and SVG to RCA. LIMA to LAD and radial graft to OM1 and RPLV patent - S/p PCI/DES to RCA and PCI/DES p Lcx into OM1 05/27/22. - No chest pain. - Continue Plavix and Eliquis. - Continue statin - He has TCTS follow up.   3. Paroxysmal atrial fibrillation - Noted post CABG. - Had recurrence at time of recent admission. Converted to SR spontaneously.  - Zio 2 week (9/23) showed mostly AF/AFL 84% burden, frequent PVCs (18.6%) - Continue amiodarone 200 mg daily. - Continue Toprol XL 100 mg daily. - Continue Eliquis 5 mg bid. Recent CBC stable. - Check amio labs.  4. Frequent PVCs - Zio 2 week (9/23): PVCs 18.6% - Amio as above. - We discussed sleep study today, he would  like to hold off for now. - ECG today with PVC, denies palpitations - Repeat Zio 2 week to make sure PVCs adequately suppressed.    5. HTN - BP stable. - No change.   6. CKD III - Baseline Scr 1.3-1.5 - Labs today   7. Recent PE - Post CABG - Continue Eliquis 5 mg bid.   8. Left pleural effusion: - s/p thoracentesis during recent admit - Trace effusion on CXR 06/17/22.   Follow up next months with Dr. Haroldine Laws + echo. If EF has recovered, will have him f/u in AHF clinic as needed.  Allena Katz, FNP-BC 08/06/22

## 2022-08-06 NOTE — Telephone Encounter (Signed)
Patient called back and instruction given to he and his wife.  Medlist updated in [facility].

## 2022-08-06 NOTE — Addendum Note (Signed)
Encounter addended by: Jacklynn Ganong, FNP on: 08/06/2022 1:33 PM  Actions taken: Clinical Note Signed

## 2022-08-06 NOTE — Telephone Encounter (Signed)
-----   Message from Jessica M Milford, FNP sent at 08/06/2022  3:39 PM EST ----- Kidney function elevated, suggests dehydration.  Decrease lasix to 40 mg daily alternating with 20 mg every other day. Repeat BMET in 10 days. 

## 2022-08-06 NOTE — Patient Instructions (Signed)
It was great to see you today! No medication changes are needed at this time.  Labs today We will only contact you if something comes back abnormal or we need to make some changes. Otherwise no news is good news!  Your provider has recommended that  you wear a Zio Patch for 14 days.  This monitor will record your heart rhythm for our review.  IF you have any symptoms while wearing the monitor please press the button.  If you have any issues with the patch or you notice a red or orange light on it please call the company at [phone].  Once you remove the patch please mail it back to the company as soon as possible so we can get the results.   Keep follow up as scheduled with Dr Gala Romney  Do the following things EVERYDAY: Weigh yourself in the morning before breakfast. Write it down and keep it in a log. Take your medicines as prescribed Eat low salt foods--Limit salt (sodium) to 2000 mg per day.  Stay as active as you can everyday Limit all fluids for the day to less than 2 liters  At the [facility], you and your health needs are our priority. As part of our continuing mission to provide you with exceptional heart care, we have created designated Provider Care Teams. These Care Teams include your primary Cardiologist (physician) and Advanced Practice Providers (APPs- Physician Assistants and Nurse Practitioners) who all work together to provide you with the care you need, when you need it.   You may see any of the following providers on your designated Care Team at your next follow up: Dr Arvilla Meres Dr Marca Ancona Dr. Marcos Eke, NP Robbie Lis, [place] [facility], [place] Brynda Peon, NP Karle Plumber, PharmD   Please be sure to bring in all your medications bottles to every appointment.   If you have any questions or concerns before your next appointment please send [place] a message through [facility] or call our office at  [phone].    TO LEAVE A MESSAGE FOR THE NURSE SELECT OPTION 2, PLEASE LEAVE A MESSAGE INCLUDING: YOUR NAME DATE OF BIRTH CALL BACK NUMBER REASON FOR CALL**this is important as we prioritize the call backs  YOU WILL RECEIVE A CALL BACK THE SAME DAY AS LONG AS YOU CALL BEFORE 4:00 PM

## 2022-08-07 ENCOUNTER — Encounter: Payer: PPO | Admitting: *Deleted

## 2022-08-07 DIAGNOSIS — Z951 Presence of aortocoronary bypass graft: Secondary | ICD-10-CM

## 2022-08-07 DIAGNOSIS — Z955 Presence of coronary angioplasty implant and graft: Secondary | ICD-10-CM

## 2022-08-07 NOTE — Progress Notes (Signed)
Daily Session Note  Patient Details  Name: [name] Goodman MRN: [id number] Date of Birth: 20-Oct-1940 Referring Provider:   Flowsheet Row Cardiac Rehab from 07/15/2022 in [facility] Card[facility]  Referring Provider Isaias Cowman MD       Encounter Date: 08/07/2022  Check In:  Session Check In - 08/07/22 1125       Check-In   Supervising physician immediately available to respond to emergencies See telemetry face sheet for immediately available ER MD    Location [facility]-Cardiac & Pulmonary Rehab    Staff Present Alberteen Sam, MA, RCEP, CCRP, CCET;Joseph [place], Ernestina Patches, RN, [place]    Virtual Visit No    Medication changes reported     No    Fall or balance concerns reported    No    Warm-up and Cool-down Performed on first and last piece of equipment    Resistance Training Performed Yes    VAD Patient? No    PAD/SET Patient? No      Pain Assessment   Currently in Pain? No/denies                Social History   Tobacco Use  Smoking Status Former   Packs/day: 1.00   Years: 15.00   Total pack years: 15.00   Types: Cigarettes   Quit date: 07/17/1975   Years since quitting: 47.0  Smokeless Tobacco Never    Goals Met:  Independence with exercise equipment Exercise tolerated well No report of concerns or symptoms today Strength training completed today  Goals Unmet:  Not Applicable  Comments: Pt able to follow exercise prescription today without complaint.  Will continue to monitor for progression.    Dr. Emily Filbert is Medical Director for [place].  Dr. Ottie Glazier is Medical Director for [facility] Pulmonary Rehabilitation.

## 2022-08-11 ENCOUNTER — Encounter: Payer: PPO | Admitting: *Deleted

## 2022-08-11 DIAGNOSIS — Z955 Presence of coronary angioplasty implant and graft: Secondary | ICD-10-CM | POA: Diagnosis not present

## 2022-08-11 DIAGNOSIS — Z951 Presence of aortocoronary bypass graft: Secondary | ICD-10-CM

## 2022-08-11 NOTE — Progress Notes (Signed)
Daily Session Note  Patient Details  Name: [name] Goodman MRN: [id number] Date of Birth: May 31, 1941 Referring Provider:   Flowsheet Row Cardiac Rehab from 07/15/2022 in [facility] Cardiac [facility] Re[facility]  Referring Provider Isaias Cowman MD       Encounter Date: 08/11/2022  Check In:  Session Check In - 08/11/22 0959       Check-In   Supervising physician immediately available to respond to emergencies See telemetry face sheet for immediately available ER MD    Location [facility]-Cardiac & Pulmonary Re[facility]    Staff Present Renita Papa, RN Odelia Gage, RN, Doyce Para, BS, ACSM CEP, Exercise Physiologist;Noah Tickle, BS, Exercise Physiologist    Virtual Visit No    Medication changes reported     No    Fall or balance concerns reported    No    Warm-up and Cool-down Performed on first and last piece of equipment    Resistance Training Performed Yes    VAD Patient? No    PAD/SET Patient? No      Pain Assessment   Currently in Pain? No/denies                Social History   Tobacco Use  Smoking Status Former   Packs/day: 1.00   Years: 15.00   Total pack years: 15.00   Types: Cigarettes   Quit date: 07/17/1975   Years since quitting: 47.1  Smokeless Tobacco Never    Goals Met:  Independence with exercise equipment Exercise tolerated well No report of concerns or symptoms today Strength training completed today  Goals Unmet:  Not Applicable  Comments: Pt able to follow exercise prescription today without complaint.  Will continue to monitor for progression.    Dr. Emily Filbert is Medical Director for [place].  Dr. Ottie Glazier is Medical Director for [facility] Pulmonary Rehabilitation.

## 2022-08-19 ENCOUNTER — Ambulatory Visit: Payer: PPO | Admitting: Thoracic Surgery (Cardiothoracic Vascular Surgery)

## 2022-08-19 ENCOUNTER — Ambulatory Visit (HOSPITAL_COMMUNITY)
Admission: RE | Admit: 2022-08-19 | Discharge: 2022-08-19 | Disposition: A | Discharge status: Home / Self Care | Payer: PPO | Source: Ambulatory Visit | Attending: Cardiology | Admitting: Cardiology

## 2022-08-19 ENCOUNTER — Encounter: Payer: Self-pay | Admitting: Thoracic Surgery (Cardiothoracic Vascular Surgery)

## 2022-08-19 VITALS — BP 106/73 | HR 76 | Resp 20 | Ht 73.0 in | Wt 197.0 lb

## 2022-08-19 DIAGNOSIS — I5022 Chronic systolic (congestive) heart failure: Secondary | ICD-10-CM | POA: Diagnosis not present

## 2022-08-19 DIAGNOSIS — I2510 Atherosclerotic heart disease of native coronary artery without angina pectoris: Secondary | ICD-10-CM | POA: Diagnosis not present

## 2022-08-19 DIAGNOSIS — Z951 Presence of aortocoronary bypass graft: Secondary | ICD-10-CM

## 2022-08-19 LAB — BASIC METABOLIC PANEL
Anion gap: 12 (ref 5–15)
BUN: 26 mg/dL — ABNORMAL HIGH (ref 8–23)
CO2: 25 mmol/L (ref 22–32)
Calcium: 9.6 mg/dL (ref 8.9–10.3)
Chloride: 105 mmol/L (ref 98–111)
Creatinine, Ser: 1.94 mg/dL — ABNORMAL HIGH (ref 0.61–1.24)
GFR, Estimated: 34 mL/min — ABNORMAL LOW (ref >60)
Glucose, Bld: 111 mg/dL — ABNORMAL HIGH (ref 70–99)
Potassium: 4.3 mmol/L (ref 3.5–5.1)
Sodium: 142 mmol/L (ref 135–145)

## 2022-08-19 NOTE — Progress Notes (Signed)
[address]       [place] [address]             [phone]     HPI: [name] Goodman returns for a scheduled follow-up visit  [name] Goodman is an 81 year old gentleman with a history of hypertension, hyperlipidemia, arthritis, BPH, nephrolithiasis, varicose veins, CAD, ischemic cardiomyopathy, postoperative atrial fibrillation, and pulmonary embolus.  He presented with chest pressure and severe fatigue.  Cardiac CT showed extensive disease.  At catheterization he was found to have left main and three-vessel disease with ejection fraction of 40%.  He underwent coronary bypass grafting x5 on 05/08/2022.  His veins were large caliber and poor quality.  Postoperatively he had atrial fibrillation.  He did eventually convert to sinus rhythm with amiodarone.  He had recurrent chest pain and had occluded saphenous vein grafts.  He had drug-eluting stents placed in the distal right coronary and OM1.  He has not had any recurrent angina since then.  He is feeling well.  He denies any chest pain, pressure, or tightness.  He has been doing cardiac rehab but feels like he can do just as much by himself at home.  Is anxious to resume golfing.  Past Medical History:  Diagnosis Date   Arthritis    History of kidney stones    Hyperlipidemia    Hypertension    Nocturia    S/P CABG (coronary artery bypass graft)      Current Outpatient Medications  Medication Sig Dispense Refill   amiodarone (PACERONE) 200 MG tablet Take 200 mg by mouth daily.     apixaban (ELIQUIS) 5 MG TABS tablet Take 1 tablet (5 mg total) by mouth 2 (two) times daily. 60 tablet 6   atorvastatin (LIPITOR) 80 MG tablet Take 1 tablet (80 mg total) by mouth daily. 30 tablet 7   clopidogrel (PLAVIX) 75 MG tablet Take 1 tablet (75 mg total) by mouth daily. 30 tablet 7   cyanocobalamin (VITAMIN B12) 1000 MCG tablet Take 1,000 mcg by mouth daily.     empagliflozin (JARDIANCE) 10 MG TABS tablet Take 1  tablet (10 mg total) by mouth daily. 30 tablet 7   furosemide (LASIX) 20 MG tablet Take 2 tablets (40 mg total) by mouth every other day AND 1 tablet (20 mg total) every other day. 60 tablet 6   isosorbide mononitrate (IMDUR) 30 MG 24 hr tablet Take 1 tablet (30 mg total) by mouth daily. 30 tablet 6   metoprolol succinate (TOPROL-XL) 100 MG 24 hr tablet Take 1 tablet (100 mg total) by mouth daily. Take with or immediately following a meal. 30 tablet 7   sacubitril-valsartan (ENTRESTO) 24-26 MG Take 1 tablet by mouth 2 (two) times daily. 60 tablet 11   spironolactone (ALDACTONE) 25 MG tablet Take 0.5 tablets (12.5 mg total) by mouth daily. 15 tablet 7   traMADol (ULTRAM) 50 MG tablet Take 1 tablet (50 mg total) by mouth every 4 (four) hours as needed for moderate pain. (Patient not taking: Reported on 08/19/2022) 30 tablet 0   No current facility-administered medications for this visit.    Physical Exam BP 106/73   Pulse 76   Resp 20   Ht 6\' 1"  (1.854 m)   Wt 197 lb (89.4 kg)   BMI 25.81 kg/m  81 year old man in no acute distress Alert and oriented x3 with no focal deficits Lungs clear Sternum stable, incision well-healed Cardiac regular rate and rhythm No peripheral edema  Diagnostic Tests: none  Impression: [name] Goodman is an 81 year old gentleman with a history of hypertension, hyperlipidemia, arthritis, BPH, nephrolithiasis, varicose veins, CAD, ischemic cardiomyopathy, postoperative atrial fibrillation, and pulmonary embolus.  He presented with chest pain and was found to have left main and three-vessel disease.  He underwent coronary bypass grafting x5 in August.  Used a combination of mammary artery, radial, and saphenous veins.  Unfortunately saphenous vein was very large caliber.  He presented back with recurrent symptoms and had occlusion of the saphenous vein grafts.  He was treated with angioplasty to his native coronaries.  His arterial grafts were  patent.  He is now about 3 months out from surgery.  There are no restrictions on his activities.  He may resume golfing.  He was advised to build into new activities gradually.  Postoperative atrial fibrillation-I just had his heart monitor removed yesterday.  Will defer to cardiology regarding amiodarone.  Ischemic cardiomyopathy-schedule for repeat echo tomorrow. Plan: Echocardiogram as scheduled tomorrow Follow-up with Dr. Darrold Junker I will be happy to see [name] Goodman back anytime in the future if I can be of any further assistance with his care  Loreli Slot, MD [facility] [phone]

## 2022-08-20 ENCOUNTER — Encounter: Payer: Self-pay | Admitting: *Deleted

## 2022-08-20 DIAGNOSIS — Z951 Presence of aortocoronary bypass graft: Secondary | ICD-10-CM

## 2022-08-20 DIAGNOSIS — Z955 Presence of coronary angioplasty implant and graft: Secondary | ICD-10-CM

## 2022-08-20 NOTE — Progress Notes (Signed)
Cardiac Individual Treatment Plan  Patient Details  Name: [name] Goodman MRN: [id number] Date of Birth: 1940/12/17 Referring Provider:   Flowsheet Row Cardiac Rehab from 07/15/2022 in [facility] Cardiac and Pulmonary Rehab  Referring Provider Isaias Cowman MD       Initial Encounter Date:  Flowsheet Row Cardiac Rehab from 07/15/2022 in [facility] Cardiac and Pulmonary Rehab  Date 07/15/22       Visit Diagnosis: S/P CABG x 5  Status post coronary artery stent placement  Patient's Home Medications on Admission:  Current Outpatient Medications:    amiodarone (PACERONE) 200 MG tablet, Take 200 mg by mouth daily., Disp: , Rfl:    apixaban (ELIQUIS) 5 MG TABS tablet, Take 1 tablet (5 mg total) by mouth 2 (two) times daily., Disp: 60 tablet, Rfl: 6   atorvastatin (LIPITOR) 80 MG tablet, Take 1 tablet (80 mg total) by mouth daily., Disp: 30 tablet, Rfl: 7   clopidogrel (PLAVIX) 75 MG tablet, Take 1 tablet (75 mg total) by mouth daily., Disp: 30 tablet, Rfl: 7   cyanocobalamin (VITAMIN B12) 1000 MCG tablet, Take 1,000 mcg by mouth daily., Disp: , Rfl:    empagliflozin (JARDIANCE) 10 MG TABS tablet, Take 1 tablet (10 mg total) by mouth daily., Disp: 30 tablet, Rfl: 7   furosemide (LASIX) 20 MG tablet, Take 2 tablets (40 mg total) by mouth every other day AND 1 tablet (20 mg total) every other day., Disp: 60 tablet, Rfl: 6   isosorbide mononitrate (IMDUR) 30 MG 24 hr tablet, Take 1 tablet (30 mg total) by mouth daily., Disp: 30 tablet, Rfl: 6   metoprolol succinate (TOPROL-XL) 100 MG 24 hr tablet, Take 1 tablet (100 mg total) by mouth daily. Take with or immediately following a meal., Disp: 30 tablet, Rfl: 7   sacubitril-valsartan (ENTRESTO) 24-26 MG, Take 1 tablet by mouth 2 (two) times daily., Disp: 60 tablet, Rfl: 11   spironolactone (ALDACTONE) 25 MG tablet, Take 0.5 tablets (12.5 mg total) by mouth daily., Disp: 15 tablet, Rfl: 7   traMADol (ULTRAM) 50 MG tablet, Take 1 tablet (50 mg  total) by mouth every 4 (four) hours as needed for moderate pain. (Patient not taking: Reported on 08/19/2022), Disp: 30 tablet, Rfl: 0  Past Medical History: Past Medical History:  Diagnosis Date   Arthritis    History of kidney stones    Hyperlipidemia    Hypertension    Nocturia    S/P CABG (coronary artery bypass graft)     Tobacco Use: Social History   Tobacco Use  Smoking Status Former   Packs/day: 1.00   Years: 15.00   Total pack years: 15.00   Types: Cigarettes   Quit date: 07/17/1975   Years since quitting: 47.1  Smokeless Tobacco Never    Labs: Review Flowsheet       Latest Ref Rng & Units 05/07/2022 05/08/2022 05/09/2022 05/23/2022  Labs for ITP Cardiac and Pulmonary Rehab  Hemoglobin A1c 4.8 - 5.6 % 6.3  - - -  PH, Arterial 7.35 - 7.45 - 7.388  7.297  7.361  7.388  7.395  7.408  7.463  7.450  7.365  7.401  7.362  7.431   PCO2 arterial 32 - 48 mmHg - 36.6  39.9  38.6  41.4  42.8  38.2  37.3  36.4  50.5  42.7  40.1  42.5   Bicarbonate 20.0 - 28.0 mmol/L - 22.1  19.5  22.1  24.9  26.2  24.1  26.8  25.3  26.8  28.9  26.5  22.8  28.2  29.2  29.5   TCO2 22 - 32 mmol/L - _0 Acid-base deficit 0.0 - 2.0 mmol/L - 3.0  7.0  3.0  2.0  -  O2 Saturation % - 97  98  98  100  100  100  100  100  85  100  100  97  96  64  67      Exercise Target Goals: Exercise Program Goal: Individual exercise prescription set using results from initial 6 min walk test and THRR while considering  patient's activity barriers and safety.   Exercise Prescription Goal: Initial exercise prescription builds to 30-45 minutes a day of aerobic activity, 2-3 days per week.  Home exercise guidelines will be given to patient during program as part of exercise prescription that the participant will acknowledge.   Education: Aerobic Exercise: - Group verbal and visual presentation on the components of exercise prescription.  Introduces F.I.T.T principle from ACSM for exercise prescriptions.  Reviews F.I.T.T. principles of aerobic exercise including progression. Written material given at graduation. Flowsheet Row Cardiac Rehab from 07/15/2022 in [facility] Cardiac and Pulmonary Rehab  Education need identified 07/15/22       Education: Resistance Exercise: - Group verbal and visual presentation on the components of exercise prescription. Introduces F.I.T.T principle from ACSM for exercise prescriptions  Reviews F.I.T.T. principles of resistance exercise including progression. Written material given at graduation.    Education: Exercise & Equipment Safety: - Individual verbal instruction and demonstration of equipment use and safety with use of the equipment. Flowsheet Row Cardiac Rehab from 07/15/2022 in [facility] Cardiac and Pulmonary Rehab  Date 07/15/22  Educator NT  Instruction Review Code 1- Verbalizes Understanding       Education: Exercise Physiology & General Exercise Guidelines: - Group verbal and written instruction with models to review the exercise physiology of the cardiovascular system and associated critical values. Provides general exercise guidelines with specific guidelines to those with heart or lung disease.    Education: Flexibility, Balance, Mind/Body Relaxation: - Group verbal and visual presentation with interactive activity on the components of exercise prescription. Introduces F.I.T.T principle from ACSM for exercise prescriptions. Reviews F.I.T.T. principles of flexibility and balance exercise training including progression. Also discusses the mind body connection.  Reviews various relaxation techniques to help reduce and manage stress (i.e. Deep breathing, progressive muscle relaxation, and visualization). Balance handout provided to take home. Written material given at graduation.   Activity Barriers & Risk Stratification:  Activity Barriers & Cardiac Risk Stratification - 07/15/22 1005        Activity Barriers & Cardiac Risk Stratification   Activity Barriers Joint Problems;Left Knee Replacement;Right Knee Replacement;Shortness of Breath    Cardiac Risk Stratification High             6 Minute Walk:  6 Minute Walk     Row Name 07/15/22 1003         6 Minute Walk   Phase Initial     Distance 1260 feet     Walk Time 6 minutes     # of Rest Breaks 0     MPH 2.39     METS 2.39     RPE 7     Perceived  Dyspnea  1     VO2 Peak 8.35     Symptoms Yes (comment)     Comments Slight SOB     Resting HR 73 bpm     Resting BP 112/60     Resting Oxygen Saturation  99 %     Exercise Oxygen Saturation  during 6 min walk 95 %     Max Ex. HR 106 bpm     Max Ex. BP 120/72     2 Minute Post BP 114/70              Oxygen Initial Assessment:   Oxygen Re-Evaluation:   Oxygen Discharge (Final Oxygen Re-Evaluation):   Initial Exercise Prescription:  Initial Exercise Prescription - 07/15/22 1000       Date of Initial Exercise RX and Referring Provider   Date 07/15/22    Referring Provider Isaias Cowman MD      Oxygen   Maintain Oxygen Saturation 88% or higher      Treadmill   MPH 2    Grade 0.5    Minutes 15    METs 2.67      Recumbant Bike   Level 1    RPM 50    Watts 12    Minutes 15    METs 2.39      NuStep   Level 2    SPM 80    Minutes 15    METs 2.39      Recumbant Elliptical   Level 1.5    RPM 50    Minutes 15    METs 2.39      REL-XR   Level 1    Speed 50    Minutes 15    METs 2.39      Prescription Details   Frequency (times per week) 3    Duration Progress to 30 minutes of continuous aerobic without signs/symptoms of physical distress      Intensity   THRR 40-80% of Max Heartrate 99-125    Ratings of Perceived Exertion 11-13    Perceived Dyspnea 0-4      Progression   Progression Continue to progress workloads to maintain intensity without signs/symptoms of physical distress.      Resistance Training    Training Prescription Yes    Weight 4 lb    Reps 10-15             Perform Capillary Blood Glucose checks as needed.  Exercise Prescription Changes:   Exercise Prescription Changes     Row Name 07/15/22 1000 08/04/22 1500 08/19/22 1500         Response to Exercise   Blood Pressure (Admit) 112/60 102/62 112/58     Blood Pressure (Exercise) 120/72 118/60 146/74     Blood Pressure (Exit) 114/70 102/80 98/56     Heart Rate (Admit) 73 bpm 82 bpm 86 bpm     Heart Rate (Exercise) 106 bpm 103 bpm 105 bpm     Heart Rate (Exit) 87 bpm 83 bpm 99 bpm     Oxygen Saturation (Admit) 99 % -- --     Oxygen Saturation (Exercise) 95 % -- --     Rating of Perceived Exertion (Exercise) _0 Perceived Dyspnea (Exercise) 1 -- --     Symptoms Slight SOB none none     Comments 6MWT Results 2nd full week of exercise --     Duration -- Continue with 30 min of aerobic exercise  without signs/symptoms of physical distress. Continue with 30 min of aerobic exercise without signs/symptoms of physical distress.     Intensity -- THRR unchanged THRR unchanged       Progression   Progression -- Continue to progress workloads to maintain intensity without signs/symptoms of physical distress. Continue to progress workloads to maintain intensity without signs/symptoms of physical distress.     Average METs -- 2.72 3.04       Resistance Training   Training Prescription -- Yes Yes     Weight -- 4 lb 4 lb     Reps -- 10-15 10-15       Interval Training   Interval Training -- No No       Treadmill   MPH -- 2.3 2.5     Grade -- 0.5 1     Minutes -- 15 15     METs -- 2.92 3.26       Recumbant Bike   Level -- 3 --     Watts -- 20 --     Minutes -- 15 --       NuStep   Level -- 4 2     Minutes -- 15 15     METs -- 4.4 3.9       Recumbant Elliptical   Level -- 1 1     Minutes -- 15 15     METs -- 1.7 1.7       REL-XR   Level -- -- 3     Minutes -- -- 15       Oxygen   Maintain Oxygen  Saturation -- -- 88% or higher              Exercise Comments:   Exercise Comments     Row Name 07/21/22 [phone]           Exercise Comments First full day of exercise!  Patient was oriented to gym and equipment including functions, settings, policies, and procedures.  Patient's individual exercise prescription and treatment plan were reviewed.  All starting workloads were established based on the results of the 6 minute walk test done at initial orientation visit.  The plan for exercise progression was also introduced and progression will be customized based on patient's performance and goals.                Exercise Goals and Review:   Exercise Goals     Row Name 07/15/22 1022             Exercise Goals   Increase Physical Activity Yes       Intervention Develop an individualized exercise prescription for aerobic and resistive training based on initial evaluation findings, risk stratification, comorbidities and participant's personal goals.;Provide advice, education, support and counseling about physical activity/exercise needs.       Expected Outcomes Long Term: Exercising regularly at least 3-5 days a week.;Long Term: Add in home exercise to make exercise part of routine and to increase amount of physical activity.;Short Term: Attend rehab on a regular basis to increase amount of physical activity.       Increase Strength and Stamina Yes       Intervention Develop an individualized exercise prescription for aerobic and resistive training based on initial evaluation findings, risk stratification, comorbidities and participant's personal goals.;Provide advice, education, support and counseling about physical activity/exercise needs.       Expected Outcomes Long Term: Improve cardiorespiratory fitness, muscular endurance and strength as measured by  increased METs and functional capacity (6MWT);Short Term: Increase workloads from initial exercise prescription for resistance,  speed, and METs.;Short Term: Perform resistance training exercises routinely during rehab and add in resistance training at home       Able to understand and use rate of perceived exertion (RPE) scale Yes       Intervention Provide education and explanation on how to use RPE scale       Expected Outcomes Short Term: Able to use RPE daily in rehab to express subjective intensity level;Long Term:  Able to use RPE to guide intensity level when exercising independently       Able to understand and use Dyspnea scale Yes       Intervention Provide education and explanation on how to use Dyspnea scale       Expected Outcomes Long Term: Able to use Dyspnea scale to guide intensity level when exercising independently;Short Term: Able to use Dyspnea scale daily in rehab to express subjective sense of shortness of breath during exertion       Knowledge and understanding of Target Heart Rate Range (THRR) Yes       Intervention Provide education and explanation of THRR including how the numbers were predicted and where they are located for reference       Expected Outcomes Short Term: Able to state/look up THRR;Long Term: Able to use THRR to govern intensity when exercising independently;Short Term: Able to use daily as guideline for intensity in rehab       Able to check pulse independently Yes       Intervention Provide education and demonstration on how to check pulse in carotid and radial arteries.;Review the importance of being able to check your own pulse for safety during independent exercise       Expected Outcomes Short Term: Able to explain why pulse checking is important during independent exercise;Long Term: Able to check pulse independently and accurately       Understanding of Exercise Prescription Yes       Intervention Provide education, explanation, and written materials on patient's individual exercise prescription       Expected Outcomes Short Term: Able to explain program exercise  prescription;Long Term: Able to explain home exercise prescription to exercise independently                Exercise Goals Re-Evaluation :  Exercise Goals Re-Evaluation     Row Name 07/21/22 3086 08/04/22 1554 08/05/22 1047 08/19/22 1526       Exercise Goal Re-Evaluation   Exercise Goals Review Able to understand and use rate of perceived exertion (RPE) scale;Able to understand and use Dyspnea scale;Knowledge and understanding of Target Heart Rate Range (THRR);Understanding of Exercise Prescription Increase Physical Activity;Increase Strength and Stamina;Understanding of Exercise Prescription Increase Physical Activity;Increase Strength and Stamina;Understanding of Exercise Prescription Increase Physical Activity;Increase Strength and Stamina;Understanding of Exercise Prescription    Comments Reviewed RPE scale, THR and program prescription with pt today.  Pt voiced understanding and was given a copy of goals to take home. [name] is doing well for the first couple of sessions he has been here.  He has already increased to level 4 on the T4 Nustep and added in a 0.5% incline on the treadmill. We will encourage patient to increase to level 1.5 on the REL as that was his starting initial exercise load. He is hitting his THR all session so far. We will continue to monitor. [name] is off to a good start in rehab.  He is already walking some on his off days and feeling good.  We will review home exercise guidelines with him soon. [name] continues to do well in rehab. He recently increased his overall average MET level to 3.04 METs. He also was able to increase his workload n the treadmill to a speed of 2.5 mph and an incline of 1%. He improved to level 3 on the XR as well. We will continue to monitor his progress in the program.    Expected Outcomes Short: Use RPE daily to regulate intensity.  Long: Follow program prescription in THR. Short: Increase REL to 1.5 level Long: Continue to increase overall MET level  Short: Continue to attend rehab regularly Long: Conitnue to improve stamina Short: Continue to increase workloads as tolerated. Long: Conitnue to increase strength and stamina.             Discharge Exercise Prescription (Final Exercise Prescription Changes):  Exercise Prescription Changes - 08/19/22 1500       Response to Exercise   Blood Pressure (Admit) 112/58    Blood Pressure (Exercise) 146/74    Blood Pressure (Exit) 98/56    Heart Rate (Admit) 86 bpm    Heart Rate (Exercise) 105 bpm    Heart Rate (Exit) 99 bpm    Rating of Perceived Exertion (Exercise) 12    Symptoms none    Duration Continue with 30 min of aerobic exercise without signs/symptoms of physical distress.    Intensity THRR unchanged      Progression   Progression Continue to progress workloads to maintain intensity without signs/symptoms of physical distress.    Average METs 3.04      Resistance Training   Training Prescription Yes    Weight 4 lb    Reps 10-15      Interval Training   Interval Training No      Treadmill   MPH 2.5    Grade 1    Minutes 15    METs 3.26      NuStep   Level 2    Minutes 15    METs 3.9      Recumbant Elliptical   Level 1    Minutes 15    METs 1.7      REL-XR   Level 3    Minutes 15      Oxygen   Maintain Oxygen Saturation 88% or higher             Nutrition:  Target Goals: Understanding of nutrition guidelines, daily intake of sodium <1556m, cholesterol <2019m calories 30% from fat and 7% or less from saturated fats, daily to have 5 or more servings of fruits and vegetables.  Education: All About Nutrition: -Group instruction provided by verbal, written material, interactive activities, discussions, models, and posters to present general guidelines for heart healthy nutrition including fat, fiber, MyPlate, the role of sodium in heart healthy nutrition, utilization of the nutrition label, and utilization of this knowledge for meal planning. Follow  up email sent as well. Written material given at graduation. [facility]  Education need identified 07/15/22       Biometrics:  Pre Biometrics - 07/15/22 1023       Pre Biometrics   Height 6' 1.5" (1.867 m)    Weight 201 lb 12.8 oz (91.5 kg)    Waist Circumference 39 inches    Hip Circumference 41.5 inches    Waist to Hip Ratio  0.94 %    BMI (Calculated) 26.26    Single Leg Stand 7.1 seconds   L             Nutrition Therapy Plan and Nutrition Goals:  Nutrition Therapy & Goals - 07/15/22 0926       Nutrition Therapy   Diet Heart healthy, low Na    Drug/Food Interactions Statins/Certain Fruits    Protein (specify units) 90-100g    Fiber 28 grams    Whole Grain Foods 3 servings    Saturated Fats 16 max. grams    Fruits and Vegetables 8 servings/day   8 ideal, but he eats smaller amounts of food during the day.   Sodium 1.5 grams      Personal Nutrition Goals   Nutrition Goal ST: consistenly eat at least 1 serving of non-starcy vegetables at dinner time, continue to read food labels for sodium, include at least 1-2 additional sources of protein for the day (easier examples inlcudes beans/lentils, peanut butter, greek yogurt, boiled eggs, or nutritional shake like boost or ensure) LT: prevent fluid retention by keeping Na <1.5-2g/day, meet protein and calorie needs, include at least 1 fruit/ non-starchy vegetable each meal    Comments 81 y.o. M admitted to cardiac rehab s/p CABG x5. PMHx includes HTN, HLD, arthritis, BPH, nephrolithiasis, CHF, CAD, TKR. Relevant medications includes lipitor, vit B12, jardiance, furosemide, MVI with minerals, tramadol. BMP 07/14/22 showed GFR 72m/min, serum creatinine 1.69 mg/dL, and BUN 26 mg/dL.Bill reports no longer using the salt shaker or using many canned foods. Bill reports his eating patterns vary. B: rasin bran with toast and coffee or biscuit from [employer] L:  nothing usually but will sometimes eat jello or fruit. Sometimes he will eat 1/2 sandwich. D: he reports cooking most meals: steak, hamburger (96% lean), pork chops, fish, chicken with vegetables (including mixed vegetables, salads, and broccoli) and potatoes. He feels he should eat more vegetables as he doesn't always like cooking for one person - suggested frozen vegetables as an option. He also reports liking pasta and he looks at the sodium for the sauce. S:jello with fruit or cups of fruit. Drinks: coffee in am and water as well as cranberry juice (a couple of glasses). He also likes to snack on unsalted nuts during the day. He reports his weight has been stable 212 lbs before this CABG x5 and now he is 190-195 lbs; he feels some of this is fluid as he felt that they pulled a lot of fluid off of him in the hospital. He reports his strength is about the same, but his energy has been lower - he walks 2 miles every morning and now will have to sit down and rest afterwards, previously he did not have to. He reports his significant other is invested in his health, but she was not able to come today. Discussed heart healthy and modest changes to diet including increasing protein as his dinner is his main source during the day. Encouraged easier ways to include protein such as beans/lentils, peanut butter, greek yogurt, boiled eggs, or nutritional shake like boost or ensure as he eats smaller amounts of food.      Intervention Plan   Intervention Nutrition handout(s) given to patient.;Prescribe, educate and counsel regarding individualized specific dietary modifications aiming towards targeted core components such as weight, hypertension, lipid management, diabetes, heart failure and other comorbidities.    Expected Outcomes Short Term Goal: Understand basic principles of dietary content, such as calories, fat, sodium,  cholesterol and nutrients.;Short Term Goal: A plan has been developed with personal nutrition  goals set during dietitian appointment.;Long Term Goal: Adherence to prescribed nutrition plan.             Nutrition Assessments:  MEDIFICTS Score Key: ?70 Need to make dietary changes  40-70 Heart Healthy Diet ? 40 Therapeutic Level Cholesterol Diet  Flowsheet Row Cardiac Rehab from 07/15/2022 in [facility] Cardiac and Pulmonary Rehab  Picture Your Plate Total Score on Admission 68      Picture Your Plate Scores: <60 Unhealthy dietary pattern with much room for improvement. 41-50 Dietary pattern unlikely to meet recommendations for good health and room for improvement. 51-60 More healthful dietary pattern, with some room for improvement.  >60 Healthy dietary pattern, although there may be some specific behaviors that could be improved.    Nutrition Goals Re-Evaluation:  Nutrition Goals Re-Evaluation     [place] Name 08/05/22 1049             Goals   Nutrition Goal ST: consistenly eat at least 1 serving of non-starcy vegetables at dinner time, continue to read food labels for sodium, include at least 1-2 additional sources of protein for the day (easier examples inlcudes beans/lentils, peanut butter, greek yogurt, boiled eggs, or nutritional shake like boost or ensure) LT: prevent fluid retention by keeping Na <1.5-2g/day, meet protein and calorie needs, include at least 1 fruit/ non-starchy vegetable each meal       Comment [name] is doing well about watching his salt intake.  He is trying to eat more salads to increase fiber and non starchy vegetables.  He is a big fan of pinto beans and cabbage.  He is getting more protein and trying some alternatives as well.       Expected Outcome Short: Conitnue to limit salt intake Long: Continue to add in more protein                Nutrition Goals Discharge (Final Nutrition Goals Re-Evaluation):  Nutrition Goals Re-Evaluation - 08/05/22 1049       Goals   Nutrition Goal ST: consistenly eat at least 1 serving of non-starcy vegetables  at dinner time, continue to read food labels for sodium, include at least 1-2 additional sources of protein for the day (easier examples inlcudes beans/lentils, peanut butter, greek yogurt, boiled eggs, or nutritional shake like boost or ensure) LT: prevent fluid retention by keeping Na <1.5-2g/day, meet protein and calorie needs, include at least 1 fruit/ non-starchy vegetable each meal    Comment [name] is doing well about watching his salt intake.  He is trying to eat more salads to increase fiber and non starchy vegetables.  He is a big fan of pinto beans and cabbage.  He is getting more protein and trying some alternatives as well.    Expected Outcome Short: Conitnue to limit salt intake Long: Continue to add in more protein             Psychosocial: Target Goals: Acknowledge presence or absence of significant depression and/or stress, maximize coping skills, provide positive support system. Participant is able to verbalize types and ability to use techniques and skills needed for reducing stress and depression.   Education: Stress, Anxiety, and Depression - Group verbal and visual presentation to define topics covered.  Reviews how body is impacted by stress, anxiety, and depression.  Also discusses healthy ways to reduce stress and to treat/manage anxiety and depression.  Written material given at graduation.  Education: Sleep Hygiene -Provides group verbal and written instruction about how sleep can affect your health.  Define sleep hygiene, discuss sleep cycles and impact of sleep habits. Review good sleep hygiene tips.    Initial Review & Psychosocial Screening:  Initial Psych Review & Screening - 07/09/22 1017       Family Dynamics   Good Support System? --   has 3 children.  2 live in area, one in [place]            Quality of Life Scores:   Quality of Life - 07/15/22 0953       Quality of Life   Select Quality of Life      Quality of Life Scores   Health/Function Pre  26 %    Socioeconomic Pre 30 %    Psych/Spiritual Pre 30 %    Family Pre 26.4 %    GLOBAL Pre 27.64 %            Scores of 19 and below usually indicate a poorer quality of life in these areas.  A difference of  2-3 points is a clinically meaningful difference.  A difference of 2-3 points in the total score of the Quality of Life Index has been associated with significant improvement in overall quality of life, self-image, physical symptoms, and general health in studies assessing change in quality of life.  PHQ-9: Review Flowsheet       07/15/2022  Depression screen PHQ 2/9  Decreased Interest 0  Down, Depressed, Hopeless 0  PHQ - 2 Score 0  Altered sleeping 0  Tired, decreased energy 1  Change in appetite 0  Feeling bad or failure about yourself  0  Trouble concentrating 0  Moving slowly or fidgety/restless 0  Suicidal thoughts 0  PHQ-9 Score 1  Difficult doing work/chores Not difficult at all   Interpretation of Total Score  Total Score Depression Severity:  1-4 = Minimal depression, 5-9 = Mild depression, 10-14 = Moderate depression, 15-19 = Moderately severe depression, 20-27 = Severe depression   Psychosocial Evaluation and Intervention:  Psychosocial Evaluation - 07/09/22 1018       Psychosocial Evaluation & Interventions   Interventions Encouraged to exercise with the program and follow exercise prescription    Comments [name] has no barriers to attending the program. He lives alone. He does have a significant other , Alice, that has been with his 29 years. He also has 3 children;2 live in the area and one lives in [place]. He also has many friends for support. He wants to get back on the golf course.    Expected Outcomes Bill attends all scheduled sessions, he is able to progress with his exercise beyond the 2 miles he is already walking. He will return to playing golf. LTG [name] continues his exercise regimen and playing golf as he wishes    Continue Psychosocial  Services  Follow up required by staff             Psychosocial Re-Evaluation:  Psychosocial Re-Evaluation     Alicia Name 08/05/22 1048             Psychosocial Re-Evaluation   Current issues with Current Stress Concerns       Comments [name] is doing well in rehab.  He is feeling good mentally.  His health is his biggest stressor, but he does his best to try not to let it get to him.  He is a good sleeper for most part.  Expected Outcomes Short: COnitnue to exercise for mental boost Long:Continue to stay positive       Interventions Encouraged to attend Cardiac Rehabilitation for the exercise       Continue Psychosocial Services  Follow up required by staff                Psychosocial Discharge (Final Psychosocial Re-Evaluation):  Psychosocial Re-Evaluation - 08/05/22 1048       Psychosocial Re-Evaluation   Current issues with Current Stress Concerns    Comments [name] is doing well in rehab.  He is feeling good mentally.  His health is his biggest stressor, but he does his best to try not to let it get to him.  He is a good sleeper for most part.    Expected Outcomes Short: COnitnue to exercise for mental boost Long:Continue to stay positive    Interventions Encouraged to attend Cardiac Rehabilitation for the exercise    Continue Psychosocial Services  Follow up required by staff             Vocational Rehabilitation: Provide vocational rehab assistance to qualifying candidates.   Vocational Rehab Evaluation & Intervention:  Vocational Rehab - 07/09/22 1024       Initial Vocational Rehab Evaluation & Intervention   Assessment shows need for Vocational Rehabilitation No      Vocational Rehab Re-Evaulation   Comments RETIRED             Education: Education Goals: Education classes will be provided on a variety of topics geared toward better understanding of heart health and risk factor modification. Participant will state understanding/return  demonstration of topics presented as noted by education test scores.  Learning Barriers/Preferences:  Learning Barriers/Preferences - 07/09/22 1012       Learning Barriers/Preferences   Learning Barriers None    Learning Preferences None             General Cardiac Education Topics:  AED/CPR: - Group verbal and written instruction with the use of models to demonstrate the basic use of the AED with the basic ABC's of resuscitation.   Anatomy and Cardiac Procedures: - Group verbal and visual presentation and models provide information about basic cardiac anatomy and function. Reviews the testing methods done to diagnose heart disease and the outcomes of the test results. Describes the treatment choices: Medical Management, Angioplasty, or Coronary Bypass Surgery for treating various heart conditions including Myocardial Infarction, Angina, Valve Disease, and Cardiac Arrhythmias.  Written material given at graduation. Flowsheet Row Cardiac Rehab from 07/15/2022 in [facility] Cardiac and Pulmonary Rehab  Education need identified 07/15/22       Medication Safety: - Group verbal and visual instruction to review commonly prescribed medications for heart and lung disease. Reviews the medication, class of the drug, and side effects. Includes the steps to properly store meds and maintain the prescription regimen.  Written material given at graduation.   Intimacy: - Group verbal instruction through game format to discuss how heart and lung disease can affect sexual intimacy. Written material given at graduation..   Know Your Numbers and Heart Failure: - Group verbal and visual instruction to discuss disease risk factors for cardiac and pulmonary disease and treatment options.  Reviews associated critical values for Overweight/Obesity, Hypertension, Cholesterol, and Diabetes.  Discusses basics of heart failure: signs/symptoms and treatments.  Introduces Heart Failure Zone chart for action plan  for heart failure.  Written material given at graduation.   Infection Prevention: - Provides verbal and written material  to individual with discussion of infection control including proper hand washing and proper equipment cleaning during exercise session. Flowsheet Row Cardiac Rehab from 07/15/2022 in [facility] Cardiac and Pulmonary Rehab  Date 07/15/22  Educator NT  Instruction Review Code 1- Verbalizes Understanding       Falls Prevention: - Provides verbal and written material to individual with discussion of falls prevention and safety. Flowsheet Row Cardiac Rehab from 07/15/2022 in [facility] Cardiac and Pulmonary Rehab  Date 07/09/22  Educator SB  Instruction Review Code 1- Verbalizes Understanding       Other: -Provides group and verbal instruction on various topics (see comments)   Knowledge Questionnaire Score:  Knowledge Questionnaire Score - 07/15/22 0956       Knowledge Questionnaire Score   Pre Score 21/26             Core Components/Risk Factors/Patient Goals at Admission:  Personal Goals and Risk Factors at Admission - 07/15/22 0956       Core Components/Risk Factors/Patient Goals on Admission    Weight Management Yes    Intervention Weight Management: Develop a combined nutrition and exercise program designed to reach desired caloric intake, while maintaining appropriate intake of nutrient and fiber, sodium and fats, and appropriate energy expenditure required for the weight goal.;Weight Management/Obesity: Establish reasonable short term and long term weight goals.;Weight Management: Provide education and appropriate resources to help participant work on and attain dietary goals.    Admit Weight 201 lb 12.8 oz (91.5 kg)    Goal Weight: Short Term 195 lb (88.5 kg)    Goal Weight: Long Term 190 lb (86.2 kg)    Expected Outcomes Short Term: Continue to assess and modify interventions until short term weight is achieved;Long Term: Adherence to nutrition and  physical activity/exercise program aimed toward attainment of established weight goal;Weight Maintenance: Understanding of the daily nutrition guidelines, which includes 25-35% calories from fat, 7% or less cal from saturated fats, less than 239m cholesterol, less than 1.5gm of sodium, & 5 or more servings of fruits and vegetables daily    Heart Failure Yes    Intervention Provide a combined exercise and nutrition program that is supplemented with education, support and counseling about heart failure. Directed toward relieving symptoms such as shortness of breath, decreased exercise tolerance, and extremity edema.    Expected Outcomes Improve functional capacity of life;Short term: Attendance in program 2-3 days a week with increased exercise capacity. Reported lower sodium intake. Reported increased fruit and vegetable intake. Reports medication compliance.;Short term: Daily weights obtained and reported for increase. Utilizing diuretic protocols set by physician.;Long term: Adoption of self-care skills and reduction of barriers for early signs and symptoms recognition and intervention leading to self-care maintenance.    Hypertension Yes    Intervention Provide education on lifestyle modifcations including regular physical activity/exercise, weight management, moderate sodium restriction and increased consumption of fresh fruit, vegetables, and low fat dairy, alcohol moderation, and smoking cessation.;Monitor prescription use compliance.    Expected Outcomes Short Term: Continued assessment and intervention until BP is < 140/966mHG in hypertensive participants. < 130/8059mG in hypertensive participants with diabetes, heart failure or chronic kidney disease.;Long Term: Maintenance of blood pressure at goal levels.    Lipids Yes    Intervention Provide education and support for participant on nutrition & aerobic/resistive exercise along with prescribed medications to achieve LDL <63m65mDL >40mg53m  Expected Outcomes Short Term: Participant states understanding of desired cholesterol values and is compliant with medications prescribed.  Participant is following exercise prescription and nutrition guidelines.;Long Term: Cholesterol controlled with medications as prescribed, with individualized exercise RX and with personalized nutrition plan. Value goals: LDL < 47m, HDL > 40 mg.             Education:Diabetes - Individual verbal and written instruction to review signs/symptoms of diabetes, desired ranges of glucose level fasting, after meals and with exercise. Acknowledge that pre and post exercise glucose checks will be done for 3 sessions at entry of program.   Core Components/Risk Factors/Patient Goals Review:   Goals and Risk Factor Review     Row Name 08/05/22 1052             Core Components/Risk Factors/Patient Goals Review   Personal Goals Review Weight Management/Obesity;Hypertension;Heart Failure       Review BRush Landmarkis doing well in rehab. His weight is holding steady and he checks it routinely.  He has not had any heart failure symptoms other than fatigue.  His pressures are doing well and he is checking it daily.  He is doing well on his meds overall.       Expected Outcomes Short: Continue to monitor heart failure symptoms Long: Conitnue to monitor risk factors                Core Components/Risk Factors/Patient Goals at Discharge (Final Review):   Goals and Risk Factor Review - 08/05/22 1052       Core Components/Risk Factors/Patient Goals Review   Personal Goals Review Weight Management/Obesity;Hypertension;Heart Failure    Review BRush Landmarkis doing well in rehab. His weight is holding steady and he checks it routinely.  He has not had any heart failure symptoms other than fatigue.  His pressures are doing well and he is checking it daily.  He is doing well on his meds overall.    Expected Outcomes Short: Continue to monitor heart failure symptoms Long: Conitnue to  monitor risk factors             ITP Comments:  ITP Comments     Row Name 07/09/22 1024 07/15/22 0946 07/21/22 0953 07/23/22 0954 08/20/22 0825   ITP Comments Virtual orientation call completed today. he has an appointment on Date: 07/15/2022  for EP eval and gym Orientation.  [facility] . Completed 6MWT and gym orientation. Initial ITP created and sent for review to Dr. MEmily Filbert Medical Director. First full day of exercise!  Patient was oriented to gym and equipment including functions, settings, policies, and procedures.  Patient's individual exercise prescription and treatment plan were reviewed.  All starting workloads were established based on the results of the 6 minute walk test done at initial orientation visit.  The plan for exercise progression was also introduced and progression will be customized based on patient's performance and goals. 30 Day review completed. Medical Director ITP review done, changes made as directed, and signed approval by Medical Director.    NEW TO PROGRAM 30 Day review completed. Medical Director ITP review done, changes made as directed, and signed approval by Medical Director.            Comments:

## 2022-08-25 ENCOUNTER — Encounter: Payer: PPO | Attending: Cardiology | Admitting: *Deleted

## 2022-08-25 DIAGNOSIS — Z48812 Encounter for surgical aftercare following surgery on the circulatory system: Secondary | ICD-10-CM | POA: Diagnosis not present

## 2022-08-25 DIAGNOSIS — Z955 Presence of coronary angioplasty implant and graft: Secondary | ICD-10-CM | POA: Diagnosis not present

## 2022-08-25 DIAGNOSIS — Z951 Presence of aortocoronary bypass graft: Secondary | ICD-10-CM | POA: Insufficient documentation

## 2022-08-25 DIAGNOSIS — I493 Ventricular premature depolarization: Secondary | ICD-10-CM | POA: Diagnosis not present

## 2022-08-25 NOTE — Progress Notes (Signed)
Daily Session Note  Patient Details  Name: [name] Goodman MRN: [id number] Date of Birth: 05-13-1941 Referring Provider:   Flowsheet Row Cardiac Rehab from 07/15/2022 in [facility] Cardiac and Pulmonary Rehab  Referring Provider Isaias Cowman MD       Encounter Date: 08/25/2022  Check In:  Session Check In - 08/25/22 1002       Check-In   Supervising physician immediately available to respond to emergencies See telemetry face sheet for immediately available ER MD    Location [facility]-Cardiac & Pulmonary Rehab    Staff Present Darlyne Russian, RN, Doyce Para, BS, ACSM CEP, Exercise Physiologist;Noah Tickle, BS, Exercise Physiologist    Virtual Visit No    Medication changes reported     No    Fall or balance concerns reported    No    Warm-up and Cool-down Performed on first and last piece of equipment    Resistance Training Performed Yes    VAD Patient? No    PAD/SET Patient? No      Pain Assessment   Currently in Pain? No/denies                Social History   Tobacco Use  Smoking Status Former   Packs/day: 1.00   Years: 15.00   Total pack years: 15.00   Types: Cigarettes   Quit date: 07/17/1975   Years since quitting: 47.1  Smokeless Tobacco Never    Goals Met:  Independence with exercise equipment Exercise tolerated well No report of concerns or symptoms today Strength training completed today  Goals Unmet:  Not Applicable  Comments: Pt able to follow exercise prescription today without complaint.  Will continue to monitor for progression.    Dr. Emily Filbert is Medical Director for [facility].  Dr. Ottie Glazier is Medical Director for [facility].

## 2022-08-27 ENCOUNTER — Ambulatory Visit (HOSPITAL_COMMUNITY)
Admission: RE | Admit: 2022-08-27 | Discharge: 2022-08-27 | Disposition: A | Discharge status: Home / Self Care | Payer: PPO | Source: Ambulatory Visit | Attending: Family Medicine | Admitting: Family Medicine

## 2022-08-27 ENCOUNTER — Encounter (HOSPITAL_COMMUNITY): Payer: Self-pay | Admitting: Internal Medicine

## 2022-08-27 ENCOUNTER — Ambulatory Visit (HOSPITAL_BASED_OUTPATIENT_CLINIC_OR_DEPARTMENT_OTHER)
Admission: RE | Admit: 2022-08-27 | Discharge: 2022-08-27 | Disposition: A | Discharge status: Home / Self Care | Payer: PPO | Source: Ambulatory Visit | Attending: Internal Medicine | Admitting: Internal Medicine

## 2022-08-27 VITALS — BP 118/70 | HR 79 | Wt 197.4 lb

## 2022-08-27 DIAGNOSIS — I493 Ventricular premature depolarization: Secondary | ICD-10-CM

## 2022-08-27 DIAGNOSIS — Z955 Presence of coronary angioplasty implant and graft: Secondary | ICD-10-CM | POA: Diagnosis not present

## 2022-08-27 DIAGNOSIS — I130 Hypertensive heart and chronic kidney disease with heart failure and stage 1 through stage 4 chronic kidney disease, or unspecified chronic kidney disease: Secondary | ICD-10-CM | POA: Diagnosis not present

## 2022-08-27 DIAGNOSIS — E785 Hyperlipidemia, unspecified: Secondary | ICD-10-CM | POA: Diagnosis not present

## 2022-08-27 DIAGNOSIS — I083 Combined rheumatic disorders of mitral, aortic and tricuspid valves: Secondary | ICD-10-CM | POA: Diagnosis not present

## 2022-08-27 DIAGNOSIS — Z7984 Long term (current) use of oral hypoglycemic drugs: Secondary | ICD-10-CM | POA: Insufficient documentation

## 2022-08-27 DIAGNOSIS — I4892 Unspecified atrial flutter: Secondary | ICD-10-CM | POA: Insufficient documentation

## 2022-08-27 DIAGNOSIS — I5022 Chronic systolic (congestive) heart failure: Secondary | ICD-10-CM

## 2022-08-27 DIAGNOSIS — Z79899 Other long term (current) drug therapy: Secondary | ICD-10-CM | POA: Insufficient documentation

## 2022-08-27 DIAGNOSIS — Z951 Presence of aortocoronary bypass graft: Secondary | ICD-10-CM | POA: Insufficient documentation

## 2022-08-27 DIAGNOSIS — N183 Chronic kidney disease, stage 3 unspecified: Secondary | ICD-10-CM | POA: Insufficient documentation

## 2022-08-27 DIAGNOSIS — I2584 Coronary atherosclerosis due to calcified coronary lesion: Secondary | ICD-10-CM

## 2022-08-27 DIAGNOSIS — Z86711 Personal history of pulmonary embolism: Secondary | ICD-10-CM | POA: Insufficient documentation

## 2022-08-27 DIAGNOSIS — I340 Nonrheumatic mitral (valve) insufficiency: Secondary | ICD-10-CM | POA: Diagnosis not present

## 2022-08-27 DIAGNOSIS — I482 Chronic atrial fibrillation, unspecified: Secondary | ICD-10-CM | POA: Diagnosis not present

## 2022-08-27 DIAGNOSIS — Z7901 Long term (current) use of anticoagulants: Secondary | ICD-10-CM | POA: Diagnosis not present

## 2022-08-27 DIAGNOSIS — I2510 Atherosclerotic heart disease of native coronary artery without angina pectoris: Secondary | ICD-10-CM | POA: Insufficient documentation

## 2022-08-27 DIAGNOSIS — Z7902 Long term (current) use of antithrombotics/antiplatelets: Secondary | ICD-10-CM | POA: Insufficient documentation

## 2022-08-27 LAB — CBC
HCT: 46.6 % (ref 39.0–52.0)
Hemoglobin: 14.4 g/dL (ref 13.0–17.0)
MCH: 28.1 pg (ref 26.0–34.0)
MCHC: 30.9 g/dL (ref 30.0–36.0)
MCV: 90.8 fL (ref 80.0–100.0)
Platelets: 174 10*3/uL (ref 150–400)
RBC: 5.13 MIL/uL (ref 4.22–5.81)
RDW: 17.2 % — ABNORMAL HIGH (ref 11.5–15.5)
WBC: 5.5 10*3/uL (ref 4.0–10.5)
nRBC: 0 % (ref 0.0–0.2)

## 2022-08-27 LAB — COMPREHENSIVE METABOLIC PANEL
ALT: 26 U/L (ref 0–44)
AST: 16 U/L (ref 15–41)
Albumin: 4 g/dL (ref 3.5–5.0)
Alkaline Phosphatase: 81 U/L (ref 38–126)
Anion gap: 7 (ref 5–15)
BUN: 25 mg/dL — ABNORMAL HIGH (ref 8–23)
CO2: 27 mmol/L (ref 22–32)
Calcium: 9.3 mg/dL (ref 8.9–10.3)
Chloride: 106 mmol/L (ref 98–111)
Creatinine, Ser: 1.78 mg/dL — ABNORMAL HIGH (ref 0.61–1.24)
GFR, Estimated: 38 mL/min — ABNORMAL LOW (ref >60)
Glucose, Bld: 104 mg/dL — ABNORMAL HIGH (ref 70–99)
Potassium: 4.6 mmol/L (ref 3.5–5.1)
Sodium: 140 mmol/L (ref 135–145)
Total Bilirubin: 0.9 mg/dL (ref 0.3–1.2)
Total Protein: 6.9 g/dL (ref 6.5–8.1)

## 2022-08-27 LAB — BRAIN NATRIURETIC PEPTIDE: B Natriuretic Peptide: 1454.9 pg/mL — ABNORMAL HIGH (ref 0.0–100.0)

## 2022-08-27 LAB — ECHOCARDIOGRAM COMPLETE
Area-P 1/2: 4.99 cm2
MV M vel: 4.81 m/s
MV Peak grad: 92.5 mmHg
MV VTI: 1.57 cm2
Radius: 0.5 cm
S' Lateral: 4.8 cm

## 2022-08-27 LAB — T4, FREE: Free T4: 0.92 ng/dL (ref 0.61–1.12)

## 2022-08-27 LAB — TSH: TSH: 3.061 u[IU]/mL (ref 0.350–4.500)

## 2022-08-27 NOTE — Progress Notes (Signed)
  Echocardiogram 2D Echocardiogram has been performed.  [name] Goodman 08/27/2022, 1:38 PM

## 2022-08-27 NOTE — Patient Instructions (Addendum)
It was great to see you today! No medication changes are needed at this time.  Labs today We will only contact you if something comes back abnormal or we need to make some changes. Otherwise no news is good news!  You have been referred to [facility] -they will be in contact with an appointment  Your physician has requested that you have a cardiac MRI. Cardiac MRI uses a computer to create images of your heart as its beating, producing both still and moving pictures of your heart and major blood vessels. For further information please visit [email or web address]. Please follow the instruction sheet given to you today for more information. -once approved with your insurance a scheduler will be in contact with an appointment   Your physician recommends that you schedule a follow-up appointment in: 3 months with Dr Gala Romney  Do the following things EVERYDAY: Weigh yourself in the morning before breakfast. Write it down and keep it in a log. Take your medicines as prescribed Eat low salt foods--Limit salt (sodium) to 2000 mg per day.  Stay as active as you can everyday Limit all fluids for the day to less than 2 liters  At the [facility], you and your health needs are our priority. As part of our continuing mission to provide you with exceptional heart care, we have created designated Provider Care Teams. These Care Teams include your primary Cardiologist (physician) and Advanced Practice Providers (APPs- Physician Assistants and Nurse Practitioners) who all work together to provide you with the care you need, when you need it.   You may see any of the following providers on your designated Care Team at your next follow up: Dr Arvilla Meres Dr Marca Ancona Dr. Marcos Eke, NP Robbie Lis, [place] [facility] [place], [place] Brynda Peon, NP Karle Plumber, PharmD   Please be sure to bring in all your medications bottles to every  appointment.   If you have any questions or concerns before your next appointment please send [place] a message through [place] or call our office at [phone].    TO LEAVE A MESSAGE FOR THE NURSE SELECT OPTION 2, PLEASE LEAVE A MESSAGE INCLUDING: YOUR NAME DATE OF BIRTH CALL BACK NUMBER REASON FOR CALL**this is important as we prioritize the call backs  YOU WILL RECEIVE A CALL BACK THE SAME DAY AS LONG AS YOU CALL BEFORE 4:00 PM       You are scheduled for a TEE/Cardioversion/TEE Cardioversion on 09/02/22 Wednesday with Dr. Gala Romney.  Please arrive at the [facility] (Main Entrance A) at [facility]: [address] [place], [place] [address] at 730 am. (1 hour prior to procedure unless lab work is needed; if lab work is needed arrive 1.5 hours ahead)  DIET: Nothing to eat or drink after midnight except a sip of water with medications (see medication instructions below)  Medication Instructions: Hold lasix 09/03/2022  Continue your anticoagulant: Eliquis You will need to continue your anticoagulant after your procedure until you  are told by your  Provider that it is safe to stop   Labs: pre procedure labs done 08/27/2022  You must have a responsible person to drive you home and stay in the waiting area during your procedure. Failure to do so could result in cancellation.  Bring your insurance cards.  *Special Note: Every effort is made to have your procedure done on time. Occasionally there are emergencies that occur at the hospital that may cause delays. Please be  patient if a delay does occur.

## 2022-08-27 NOTE — H&P (View-Only) (Signed)
ADVANCED HF CLINIC NOTE   Primary Care: [name], MD Primary Cardiologist: Dr. Darrold Junker HF Cardiologist: Dr. Gala Romney  HPI: [name] Goodman is a 81 y.o.male with history of CAD s/p CABG 08/23, HFmEF, atrial fibrillation, HTN, HLD.   Patient underwent CABG X 5 on 05/08/22. Intra-op TEE with LVEF 40-45%. Had postoperative atrial fibrillation and developed bradycardia on amiodarone drip. He was started on cardizem and Eliquis and converted to SR prior to discharge.    He was readmitted on 05/18/22 with segmental and subsegmental PE right middle and upper lobes, CHF, left pleural effusion s/p thoracentesis and recurrent atrial fibrillation. Echo 05/19/22: EF 25-30%, paradoxical septal motion consistent with postop status, mildly reduced RV. He was diuresed with IV lasix and GDMT titrated. Cardiazem stopped d/t reduced EF and transitioned to metoprolol. Converted to SR during admit. PVCs also noted on tele. Had early graft failure with occlusion of SVG to Diagonal and SVG to RCA occluded. LIMA to LAD and radial jump graft to OM and RPLV patent. Underwent successful staged PCI/DES to distal RCA and PCI/DES p Cx into OM1.   Seen in TOC, NYHA II and volume stable. Remained in NSR and spiro 12.5 added and referred to AHF.  Zio 9/23 showed AF/AFL burden 84% with PVC burden of 18.6%. Started on amiodarone.  Follow up 10/23, stable NYHA II-early III and volume OK. Losartan stopped, Entresto 24/26 started, spiro 12.5 restarted and lasix decreased to 20 mg daily. Lasix later increased to 40 mg daily at general cardiology follow up due to increase dyspnea.   Today he returns for HF follow up with his partner. Overall feeling fine. Doing cardiac rehab twice a week. Walking 1-2 miles.Only SOB with hills. No CP, orthopnea, PND or edema. Fatigued at times. Taking lasix daily 40 alternating 20 and 40. Takes extra as needed   Echo today 08/27/22 EF 25-30% moderate to severe MR. Personally  reviewed  Zio 11/23 Continuous AFL (avg 76 bpm) PVC 10.7% (2 morphologies 6.0% & 4.7%)    Cardiac Studies  - Zio 2 week (9/23): mostly AF/AFL (84%) burden, PVCs (18.6% burden), 589 runs of NSVT  - Echo (05/18/22): EF 25-30%, paradoxical septal motion consistent with postop status, mildly reduced RV.   - Intra-op TEE (05/08/22): EF 40-45%  Past Medical History:  Diagnosis Date   Arthritis    History of kidney stones    Hyperlipidemia    Hypertension    Nocturia    S/P CABG (coronary artery bypass graft)    Current Outpatient Medications  Medication Sig Dispense Refill   amiodarone (PACERONE) 200 MG tablet Take 200 mg by mouth daily.     apixaban (ELIQUIS) 5 MG TABS tablet Take 1 tablet (5 mg total) by mouth 2 (two) times daily. 60 tablet 6   atorvastatin (LIPITOR) 80 MG tablet Take 1 tablet (80 mg total) by mouth daily. 30 tablet 7   clopidogrel (PLAVIX) 75 MG tablet Take 1 tablet (75 mg total) by mouth daily. 30 tablet 7   cyanocobalamin (VITAMIN B12) 1000 MCG tablet Take 1,000 mcg by mouth daily.     empagliflozin (JARDIANCE) 10 MG TABS tablet Take 1 tablet (10 mg total) by mouth daily. 30 tablet 7   furosemide (LASIX) 20 MG tablet Take 2 tablets (40 mg total) by mouth every other day AND 1 tablet (20 mg total) every other day. 60 tablet 6   isosorbide mononitrate (IMDUR) 30 MG 24 hr tablet Take 1 tablet (30 mg total) by mouth daily. 30  tablet 6   metoprolol succinate (TOPROL-XL) 100 MG 24 hr tablet Take 1 tablet (100 mg total) by mouth daily. Take with or immediately following a meal. 30 tablet 7   sacubitril-valsartan (ENTRESTO) 24-26 MG Take 1 tablet by mouth 2 (two) times daily. 60 tablet 11   spironolactone (ALDACTONE) 25 MG tablet Take 0.5 tablets (12.5 mg total) by mouth daily. 15 tablet 7   No current facility-administered medications for this encounter.   No Known Allergies  Social History   Socioeconomic History   Marital status: Divorced    Spouse name: Not on  file   Number of children: 3   Years of education: Not on file   Highest education level: Master's degree (e.g., MA, MS, MEng, MEd, MSW, MBA)  Occupational History   Occupation: retired coach at [employer]  Tobacco Use   Smoking status: Former    Packs/day: 1.00    Years: 15.00    Total pack years: 15.00    Types: Cigarettes    Quit date: 07/17/1975    Years since quitting: 47.1   Smokeless tobacco: Never  Vaping Use   Vaping Use: Never used  Substance and Sexual Activity   Alcohol use: No   Drug use: No   Sexual activity: Not on file  Other Topics Concern   Not on file  Social History Narrative   Lives alone   Social Determinants of Health   Financial Resource Strain: Low Risk  (05/20/2022)   Overall Financial Resource Strain (CARDIA)    Difficulty of Paying Living Expenses: Not hard at all  Food Insecurity: No Food Insecurity (05/20/2022)   Hunger Vital Sign    Worried About Running Out of Food in the Last Year: Never true    Ran Out of Food in the Last Year: Never true  Transportation Needs: No Transportation Needs (05/20/2022)   PRAPARE - Transportation    Lack of Transportation (Medical): No    Lack of Transportation (Non-Medical): No  Physical Activity: Not on file  Stress: Not on file  Social Connections: Not on file  Intimate Partner Violence: Not on file   Family History  Problem Relation Age of Onset   Hypertension Father    Cancer Father    BP 118/70   Pulse 79   Wt 89.5 kg (197 lb 6.4 oz)   SpO2 97%   BMI 26.04 kg/m   Wt Readings from Last 3 Encounters:  08/27/22 89.5 kg (197 lb 6.4 oz)  08/19/22 89.4 kg (197 lb)  08/06/22 88.8 kg (195 lb 12.8 oz)   PHYSICAL EXAM: General:  Well appearing. No resp difficulty HEENT: normal Neck: supple. JVP 7 Carotids 2+ bilat; no bruits. No lymphadenopathy or thryomegaly appreciated. Cor: PMI nondisplaced. Irregular rate & rhythm. No rubs, gallops or murmurs. Lungs: clear Abdomen: soft, nontender, nondistended. No  hepatosplenomegaly. No bruits or masses. Good bowel sounds. Extremities: no cyanosis, clubbing, rash, edema Neuro: alert & orientedx3, cranial nerves grossly intact. moves all 4 extremities w/o difficulty. Affect pleasant   ASSESSMENT & PLAN: Chronic systolic CHF - pre-CABG LV gram (7/23): EF 35-40%  - Intraop TEE 05/08/22: EF 40-45% - Limited echo 05/19/22: EF 25-30%, RV mildly reduced - -S/p PCI/DES to RCA and PCI/DES to p Lcx into OM1 9/23 (see below).  - Echo today 08/27/22 EF 25-30% moderate to severe MR. Personally reviewed - suspect primarily iCM. Had early graft failure post CABG. vs PVC  - NYHA II. Volume looks good on exam. Continue lasix - Continue   Entresto 24/26 mg bid.  - Continue spiro 12.5 mg daily.  - Continue Jardiance 10 mg daily. - Continue Toprol XL 100 mg daily. - Continue CR. - Labs today. - will get cMRI to assess scar burden   2. CAD - S/p CABG X 5 (LIMA to LAD, SVG to PDA, SVG to D1, sequential left radial artery to OM and PL) 05/08/22 - Early graft failure with occlusion of SVG to D1 and SVG to RCA. LIMA to LAD and radial graft to OM1 and RPLV patent - S/p PCI/DES to RCA and PCI/DES p Lcx into OM1 05/27/22. - No s/s angina - Continue Plavix and Eliquis. - Continue statin  3. Chronic atrial fibrillation/AFL  - Noted post CABG. - Had recurrence at time of recent admission. Converted to SR spontaneously.  - Zio 2 week (9/23) showed mostly AF/AFL 84% burden, frequent PVCs (18.6%) - Zio 11/23 100% AFL  - Continue amiodarone 200 mg daily. - Continue Toprol XL 100 mg daily. - Continue Eliquis 5 mg bid. Recent CBC stable. - Check amio labs. - Plan DC-CV next week  4. Frequent PVCs - Zio 2 week (9/23): PVCs 18.6% - Previously discussed sleep study, he would like to hold off for now. - Repeat Zio 2 11/23 10.7% PVCs - remains on amio - Refer to EP. ? Add mexilitene   5. HTN - BP stable. - No change.   6. CKD III - Baseline Scr 1.3-1.5 - Labs today    7. Recent PE - Post CABG - Continue Eliquis 5 mg bid.   8. Mitral regurgitation - moderate to severe on echo today - likely ischemic (vs functional) - check cMRI   Total time spent 45 minutes. Over half that time spent discussing above.     Hendy Brindle, MD  3:02 PM  

## 2022-08-27 NOTE — Progress Notes (Signed)
ADVANCED HF CLINIC NOTE   Primary Care: [name], MD Primary Cardiologist: Dr. Darrold Junker HF Cardiologist: Dr. Gala Romney  HPI: [name] Goodman is a 81 y.o.male with history of CAD s/p CABG 08/23, HFmEF, atrial fibrillation, HTN, HLD.   Patient underwent CABG X 5 on 05/08/22. Intra-op TEE with LVEF 40-45%. Had postoperative atrial fibrillation and developed bradycardia on amiodarone drip. He was started on cardizem and Eliquis and converted to SR prior to discharge.    He was readmitted on 05/18/22 with segmental and subsegmental PE right middle and upper lobes, CHF, left pleural effusion s/p thoracentesis and recurrent atrial fibrillation. Echo 05/19/22: EF 25-30%, paradoxical septal motion consistent with postop status, mildly reduced RV. He was diuresed with IV lasix and GDMT titrated. Cardiazem stopped d/t reduced EF and transitioned to metoprolol. Converted to SR during admit. PVCs also noted on tele. Had early graft failure with occlusion of SVG to Diagonal and SVG to RCA occluded. LIMA to LAD and radial jump graft to OM and RPLV patent. Underwent successful staged PCI/DES to distal RCA and PCI/DES p Cx into OM1.   Seen in TOC, NYHA II and volume stable. Remained in NSR and spiro 12.5 added and referred to AHF.  Zio 9/23 showed AF/AFL burden 84% with PVC burden of 18.6%. Started on amiodarone.  Follow up 10/23, stable NYHA II-early III and volume OK. Losartan stopped, Entresto 24/26 started, spiro 12.5 restarted and lasix decreased to 20 mg daily. Lasix later increased to 40 mg daily at general cardiology follow up due to increase dyspnea.   Today he returns for HF follow up with his partner. Overall feeling fine. Doing cardiac rehab twice a week. Walking 1-2 miles.Only SOB with hills. No CP, orthopnea, PND or edema. Fatigued at times. Taking lasix daily 40 alternating 20 and 40. Takes extra as needed   Echo today 08/27/22 EF 25-30% moderate to severe MR. Personally  reviewed  Zio 11/23 Continuous AFL (avg 76 bpm) PVC 10.7% (2 morphologies 6.0% & 4.7%)    Cardiac Studies  - Zio 2 week (9/23): mostly AF/AFL (84%) burden, PVCs (18.6% burden), 589 runs of NSVT  - Echo (05/18/22): EF 25-30%, paradoxical septal motion consistent with postop status, mildly reduced RV.   - Intra-op TEE (05/08/22): EF 40-45%  Past Medical History:  Diagnosis Date   Arthritis    History of kidney stones    Hyperlipidemia    Hypertension    Nocturia    S/P CABG (coronary artery bypass graft)    Current Outpatient Medications  Medication Sig Dispense Refill   amiodarone (PACERONE) 200 MG tablet Take 200 mg by mouth daily.     apixaban (ELIQUIS) 5 MG TABS tablet Take 1 tablet (5 mg total) by mouth 2 (two) times daily. 60 tablet 6   atorvastatin (LIPITOR) 80 MG tablet Take 1 tablet (80 mg total) by mouth daily. 30 tablet 7   clopidogrel (PLAVIX) 75 MG tablet Take 1 tablet (75 mg total) by mouth daily. 30 tablet 7   cyanocobalamin (VITAMIN B12) 1000 MCG tablet Take 1,000 mcg by mouth daily.     empagliflozin (JARDIANCE) 10 MG TABS tablet Take 1 tablet (10 mg total) by mouth daily. 30 tablet 7   furosemide (LASIX) 20 MG tablet Take 2 tablets (40 mg total) by mouth every other day AND 1 tablet (20 mg total) every other day. 60 tablet 6   isosorbide mononitrate (IMDUR) 30 MG 24 hr tablet Take 1 tablet (30 mg total) by mouth daily. 30  tablet 6   metoprolol succinate (TOPROL-XL) 100 MG 24 hr tablet Take 1 tablet (100 mg total) by mouth daily. Take with or immediately following a meal. 30 tablet 7   sacubitril-valsartan (ENTRESTO) 24-26 MG Take 1 tablet by mouth 2 (two) times daily. 60 tablet 11   spironolactone (ALDACTONE) 25 MG tablet Take 0.5 tablets (12.5 mg total) by mouth daily. 15 tablet 7   No current facility-administered medications for this encounter.   No Known Allergies  Social History   Socioeconomic History   Marital status: Divorced    Spouse name: Not on  file   Number of children: 3   Years of education: Not on file   Highest education level: Master's degree (e.g., MA, MS, MEng, MEd, MSW, MBA)  Occupational History   Occupation: retired Psychologist, occupational at [employer]  Tobacco Use   Smoking status: Former    Packs/day: 1.00    Years: 15.00    Total pack years: 15.00    Types: Cigarettes    Quit date: 07/17/1975    Years since quitting: 47.1   Smokeless tobacco: Never  Vaping Use   Vaping Use: Never used  Substance and Sexual Activity   Alcohol use: No   Drug use: No   Sexual activity: Not on file  Other Topics Concern   Not on file  Social History Narrative   Lives alone   Social Determinants of Health   Financial Resource Strain: Low Risk  (05/20/2022)   Overall Financial Resource Strain (CARDIA)    Difficulty of Paying Living Expenses: Not hard at all  Food Insecurity: No Food Insecurity (05/20/2022)   Hunger Vital Sign    Worried About Running Out of Food in the Last Year: Never true    Ran Out of Food in the Last Year: Never true  Transportation Needs: No Transportation Needs (05/20/2022)   PRAPARE - Administrator, Civil Service (Medical): No    Lack of Transportation (Non-Medical): No  Physical Activity: Not on file  Stress: Not on file  Social Connections: Not on file  Intimate Partner Violence: Not on file   Family History  Problem Relation Age of Onset   Hypertension Father    Cancer Father    BP 118/70   Pulse 79   Wt 89.5 kg (197 lb 6.4 oz)   SpO2 97%   BMI 26.04 kg/m   Wt Readings from Last 3 Encounters:  08/27/22 89.5 kg (197 lb 6.4 oz)  08/19/22 89.4 kg (197 lb)  08/06/22 88.8 kg (195 lb 12.8 oz)   PHYSICAL EXAM: General:  Well appearing. No resp difficulty HEENT: normal Neck: supple. JVP 7 Carotids 2+ bilat; no bruits. No lymphadenopathy or thryomegaly appreciated. Cor: PMI nondisplaced. Irregular rate & rhythm. No rubs, gallops or murmurs. Lungs: clear Abdomen: soft, nontender, nondistended. No  hepatosplenomegaly. No bruits or masses. Good bowel sounds. Extremities: no cyanosis, clubbing, rash, edema Neuro: alert & orientedx3, cranial nerves grossly intact. moves all 4 extremities w/o difficulty. Affect pleasant   ASSESSMENT & PLAN: Chronic systolic CHF - pre-CABG LV gram (7/23): EF 35-40%  - Intraop TEE 05/08/22: EF 40-45% - Limited echo 05/19/22: EF 25-30%, RV mildly reduced - -S/p PCI/DES to RCA and PCI/DES to p Lcx into OM1 9/23 (see below).  - Echo today 08/27/22 EF 25-30% moderate to severe MR. Personally reviewed - suspect primarily iCM. Had early graft failure post CABG. vs PVC  - NYHA II. Volume looks good on exam. Continue lasix - Continue  Entresto 24/26 mg bid.  - Continue spiro 12.5 mg daily.  - Continue Jardiance 10 mg daily. - Continue Toprol XL 100 mg daily. - Continue CR. - Labs today. - will get cMRI to assess scar burden   2. CAD - S/p CABG X 5 (LIMA to LAD, SVG to PDA, SVG to D1, sequential left radial artery to OM and PL) 05/08/22 - Early graft failure with occlusion of SVG to D1 and SVG to RCA. LIMA to LAD and radial graft to OM1 and RPLV patent - S/p PCI/DES to RCA and PCI/DES p Lcx into OM1 05/27/22. - No s/s angina - Continue Plavix and Eliquis. - Continue statin  3. Chronic atrial fibrillation/AFL  - Noted post CABG. - Had recurrence at time of recent admission. Converted to SR spontaneously.  - Zio 2 week (9/23) showed mostly AF/AFL 84% burden, frequent PVCs (18.6%) - Zio 11/23 100% AFL  - Continue amiodarone 200 mg daily. - Continue Toprol XL 100 mg daily. - Continue Eliquis 5 mg bid. Recent CBC stable. - Check amio labs. - Plan DC-CV next week  4. Frequent PVCs - Zio 2 week (9/23): PVCs 18.6% - Previously discussed sleep study, he would like to hold off for now. - Repeat Zio 2 11/23 10.7% PVCs - remains on amio - Refer to EP. ? Add mexilitene   5. HTN - BP stable. - No change.   6. CKD III - Baseline Scr 1.3-1.5 - Labs today    7. Recent PE - Post CABG - Continue Eliquis 5 mg bid.   8. Mitral regurgitation - moderate to severe on echo today - likely ischemic (vs functional) - check cMRI   Total time spent 45 minutes. Over half that time spent discussing above.     Arvilla Meres, MD  3:02 PM

## 2022-08-28 LAB — T3, FREE: T3, Free: 2.7 pg/mL (ref 2.0–4.4)

## 2022-09-01 ENCOUNTER — Encounter: Payer: PPO | Admitting: *Deleted

## 2022-09-01 DIAGNOSIS — Z955 Presence of coronary angioplasty implant and graft: Secondary | ICD-10-CM

## 2022-09-01 DIAGNOSIS — Z951 Presence of aortocoronary bypass graft: Secondary | ICD-10-CM | POA: Diagnosis not present

## 2022-09-01 NOTE — Progress Notes (Signed)
Daily Session Note  Patient Details  Name: [name] Goodman MRN: [id number] Date of Birth: 1941-08-16 Referring Provider:   Flowsheet Row Cardiac Rehab from 07/15/2022 in [facility]  Referring Provider Isaias Cowman MD       Encounter Date: 09/01/2022  Check In:  Session Check In - 09/01/22 1042       Check-In   Supervising physician immediately available to respond to emergencies See telemetry face sheet for immediately available ER MD    Location [facility]-Cardiac & Pulmonary Rehab    Staff Present Earlean Shawl, BS, ACSM CEP, Exercise Physiologist;Ricquel Foulk Tamala Julian, RN, Dimple Nanas, BS, Exercise Physiologist    Virtual Visit No    Medication changes reported     No    Fall or balance concerns reported    No    Warm-up and Cool-down Performed on first and last piece of equipment    Resistance Training Performed Yes    VAD Patient? No    PAD/SET Patient? No      Pain Assessment   Currently in Pain? No/denies                Social History   Tobacco Use  Smoking Status Former   Packs/day: 1.00   Years: 15.00   Total pack years: 15.00   Types: Cigarettes   Quit date: 07/17/1975   Years since quitting: 47.1  Smokeless Tobacco Never    Goals Met:  Independence with exercise equipment Exercise tolerated well No report of concerns or symptoms today Strength training completed today  Goals Unmet:  Not Applicable  Comments: Pt able to follow exercise prescription today without complaint.  Will continue to monitor for progression.    Dr. Emily Filbert is Medical Director for [facility].  Dr. Ottie Glazier is Medical Director for [facility].

## 2022-09-02 ENCOUNTER — Ambulatory Visit (HOSPITAL_COMMUNITY): Payer: PPO | Admitting: Certified Registered"

## 2022-09-02 ENCOUNTER — Ambulatory Visit (HOSPITAL_COMMUNITY)
Admission: RE | Admit: 2022-09-02 | Discharge: 2022-09-02 | Disposition: A | Discharge status: Home / Self Care | Payer: PPO | Attending: Internal Medicine | Admitting: Internal Medicine

## 2022-09-02 ENCOUNTER — Other Ambulatory Visit: Payer: Self-pay

## 2022-09-02 ENCOUNTER — Ambulatory Visit (HOSPITAL_BASED_OUTPATIENT_CLINIC_OR_DEPARTMENT_OTHER): Payer: PPO | Admitting: Certified Registered"

## 2022-09-02 ENCOUNTER — Encounter (HOSPITAL_COMMUNITY)
Admission: RE | Disposition: A | Discharge status: Home / Self Care | Payer: Self-pay | Source: Home / Self Care | Attending: Internal Medicine

## 2022-09-02 DIAGNOSIS — I509 Heart failure, unspecified: Secondary | ICD-10-CM | POA: Diagnosis not present

## 2022-09-02 DIAGNOSIS — Z7984 Long term (current) use of oral hypoglycemic drugs: Secondary | ICD-10-CM | POA: Insufficient documentation

## 2022-09-02 DIAGNOSIS — Z7902 Long term (current) use of antithrombotics/antiplatelets: Secondary | ICD-10-CM | POA: Insufficient documentation

## 2022-09-02 DIAGNOSIS — Z86711 Personal history of pulmonary embolism: Secondary | ICD-10-CM | POA: Diagnosis not present

## 2022-09-02 DIAGNOSIS — I130 Hypertensive heart and chronic kidney disease with heart failure and stage 1 through stage 4 chronic kidney disease, or unspecified chronic kidney disease: Secondary | ICD-10-CM | POA: Diagnosis not present

## 2022-09-02 DIAGNOSIS — I110 Hypertensive heart disease with heart failure: Secondary | ICD-10-CM

## 2022-09-02 DIAGNOSIS — Z87891 Personal history of nicotine dependence: Secondary | ICD-10-CM

## 2022-09-02 DIAGNOSIS — Z79899 Other long term (current) drug therapy: Secondary | ICD-10-CM | POA: Diagnosis not present

## 2022-09-02 DIAGNOSIS — N183 Chronic kidney disease, stage 3 unspecified: Secondary | ICD-10-CM | POA: Insufficient documentation

## 2022-09-02 DIAGNOSIS — E785 Hyperlipidemia, unspecified: Secondary | ICD-10-CM | POA: Diagnosis not present

## 2022-09-02 DIAGNOSIS — I482 Chronic atrial fibrillation, unspecified: Secondary | ICD-10-CM | POA: Insufficient documentation

## 2022-09-02 DIAGNOSIS — I4510 Unspecified right bundle-branch block: Secondary | ICD-10-CM | POA: Diagnosis not present

## 2022-09-02 DIAGNOSIS — I340 Nonrheumatic mitral (valve) insufficiency: Secondary | ICD-10-CM | POA: Insufficient documentation

## 2022-09-02 DIAGNOSIS — Z951 Presence of aortocoronary bypass graft: Secondary | ICD-10-CM | POA: Diagnosis not present

## 2022-09-02 DIAGNOSIS — I2510 Atherosclerotic heart disease of native coronary artery without angina pectoris: Secondary | ICD-10-CM | POA: Diagnosis not present

## 2022-09-02 DIAGNOSIS — Z955 Presence of coronary angioplasty implant and graft: Secondary | ICD-10-CM | POA: Insufficient documentation

## 2022-09-02 DIAGNOSIS — I5022 Chronic systolic (congestive) heart failure: Secondary | ICD-10-CM | POA: Insufficient documentation

## 2022-09-02 DIAGNOSIS — I4891 Unspecified atrial fibrillation: Secondary | ICD-10-CM

## 2022-09-02 DIAGNOSIS — Z7901 Long term (current) use of anticoagulants: Secondary | ICD-10-CM | POA: Diagnosis not present

## 2022-09-02 HISTORY — PX: CARDIOVERSION: SHX1299

## 2022-09-02 SURGERY — CARDIOVERSION
Anesthesia: General

## 2022-09-02 MED ORDER — APIXABAN 5 MG PO TABS
5.0000 mg | ORAL_TABLET | Freq: Once | ORAL | Status: AC
  Administered 2022-09-02: 5 mg via ORAL
  Filled 2022-09-02: qty 1

## 2022-09-02 MED ORDER — CLOPIDOGREL BISULFATE 75 MG PO TABS
75.0000 mg | ORAL_TABLET | Freq: Once | ORAL | Status: AC
  Administered 2022-09-02: 75 mg via ORAL
  Filled 2022-09-02: qty 1

## 2022-09-02 MED ORDER — SODIUM CHLORIDE 0.9 % IV SOLN: INTRAVENOUS | Status: DC

## 2022-09-02 MED ORDER — LIDOCAINE 2% (20 MG/ML) 5 ML SYRINGE
INTRAMUSCULAR | Status: DC | PRN
  Administered 2022-09-02: 40 mg via INTRAVENOUS

## 2022-09-02 MED ORDER — PROPOFOL 10 MG/ML IV BOLUS
INTRAVENOUS | Status: DC | PRN
  Administered 2022-09-02: 70 mg via INTRAVENOUS

## 2022-09-02 NOTE — Transfer of Care (Signed)
Immediate Anesthesia Transfer of Care Note  Patient: [name] Goodman  Procedure(s) Performed: CARDIOVERSION  Patient Location: Endoscopy Unit  Anesthesia Type:General  Level of Consciousness: drowsy  Airway & Oxygen Therapy: Patient Spontanous Breathing  Post-op Assessment: Report given to RN  Post vital signs: Reviewed and stable  Last Vitals:  Vitals Value Taken Time  BP 105/65   Temp    Pulse 54   Resp 18   SpO2 97     Last Pain:  Vitals:   09/02/22 0731  TempSrc: Temporal  PainSc: 0-No pain         Complications: No notable events documented.

## 2022-09-02 NOTE — Anesthesia Postprocedure Evaluation (Signed)
Anesthesia Post Note  Patient: [name] Goodman  Procedure(s) Performed: CARDIOVERSION     Patient location during evaluation: PACU Anesthesia Type: General Level of consciousness: awake Pain management: pain level controlled Vital Signs Assessment: post-procedure vital signs reviewed and stable Respiratory status: spontaneous breathing Cardiovascular status: stable Postop Assessment: adequate PO intake Anesthetic complications: no   No notable events documented.  Last Vitals:  Vitals:   09/02/22 0910 09/02/22 0920  BP: (!) 96/52 111/64  Pulse: (!) 50 (!) 52  Resp: 15   Temp:    SpO2: 98% 99%    Last Pain:  Vitals:   09/02/22 0900  TempSrc:   PainSc: 0-No pain                 Jamonica Schoff

## 2022-09-02 NOTE — Anesthesia Preprocedure Evaluation (Addendum)
Anesthesia Evaluation  Patient identified by MRN, date of birth, ID band Patient awake    Reviewed: Allergy & Precautions, NPO status , Patient's Chart, lab work & pertinent test results  Airway Mallampati: II       Dental   Pulmonary former smoker   breath sounds clear to auscultation       Cardiovascular hypertension, + CAD and +CHF   Rhythm:Regular Rate:Normal     Neuro/Psych    GI/Hepatic negative GI ROS, Neg liver ROS,,,  Endo/Other    Renal/GU Renal disease     Musculoskeletal  (+) Arthritis ,    Abdominal   Peds  Hematology   Anesthesia Other Findings   Reproductive/Obstetrics                             Anesthesia Physical Anesthesia Plan  ASA: 3  Anesthesia Plan:    Post-op Pain Management:    Induction: Intravenous  PONV Risk Score and Plan: 2 and Propofol infusion  Airway Management Planned: Nasal Cannula and Simple Face Mask  Additional Equipment:   Intra-op Plan:   Post-operative Plan:   Informed Consent: I have reviewed the patients History and Physical, chart, labs and discussed the procedure including the risks, benefits and alternatives for the proposed anesthesia with the patient or authorized representative who has indicated his/her understanding and acceptance.     Dental advisory given  Plan Discussed with: CRNA and Anesthesiologist  Anesthesia Plan Comments:        Anesthesia Quick Evaluation

## 2022-09-02 NOTE — Discharge Instructions (Signed)

## 2022-09-02 NOTE — CV Procedure (Signed)
    DIRECT CURRENT CARDIOVERSION  NAME:  [name] Goodman   MRN: [id number] DOB:  1940/11/06   ADMIT DATE: 09/02/2022   INDICATIONS: Atrial fibrillation    PROCEDURE:   Informed consent was obtained prior to the procedure. The risks, benefits and alternatives for the procedure were discussed and the patient comprehended these risks. Once an appropriate time out was taken, the patient had the defibrillator pads placed in the anterior and posterior position. The patient then underwent sedation by the anesthesia service. Once an appropriate level of sedation was achieved, the patient received a single biphasic, synchronized 200J shock with prompt conversion to sinus rhythm. No apparent complications.  Arvilla Meres, MD  8:53 AM

## 2022-09-02 NOTE — Interval H&P Note (Signed)
History and Physical Interval Note:  09/02/2022 8:35 AM  [name] Goodman  has presented today for surgery, with the diagnosis of afib.  The various methods of treatment have been discussed with the patient and family. After consideration of risks, benefits and other options for treatment, the patient has consented to  Procedure(s): CARDIOVERSION (N/A) as a surgical intervention.  The patient's history has been reviewed, patient examined, no change in status, stable for surgery.  I have reviewed the patient's chart and labs.  Questions were answered to the patient's satisfaction.     Kandace Elrod

## 2022-09-03 ENCOUNTER — Encounter: Payer: PPO | Admitting: *Deleted

## 2022-09-03 DIAGNOSIS — Z951 Presence of aortocoronary bypass graft: Secondary | ICD-10-CM | POA: Diagnosis not present

## 2022-09-03 DIAGNOSIS — Z955 Presence of coronary angioplasty implant and graft: Secondary | ICD-10-CM

## 2022-09-03 NOTE — Progress Notes (Signed)
Daily Session Note  Patient Details  Name: [name] Goodman MRN: [id number] Date of Birth: 10-16-1940 Referring Provider:   Flowsheet Row Cardiac Rehab from 07/15/2022 in [facility] Cardiac and Pulmonary Rehab  Referring Provider Isaias Cowman MD       Encounter Date: 09/03/2022  Check In:  Session Check In - 09/03/22 1037       Check-In   Supervising physician immediately available to respond to emergencies See telemetry face sheet for immediately available ER MD    Location [facility]-Cardiac & Pulmonary Rehab    Staff Present Darlyne Russian, RN, Dimple Nanas, BS, Exercise Physiologist;Joseph Tessie Fass, [place]    Virtual Visit No    Medication changes reported     No    Fall or balance concerns reported    No    Warm-up and Cool-down Performed on first and last piece of equipment    Resistance Training Performed Yes    VAD Patient? No    PAD/SET Patient? No      Pain Assessment   Currently in Pain? No/denies                Social History   Tobacco Use  Smoking Status Former   Packs/day: 1.00   Years: 15.00   Total pack years: 15.00   Types: Cigarettes   Quit date: 07/17/1975   Years since quitting: 47.1  Smokeless Tobacco Never    Goals Met:  Independence with exercise equipment Exercise tolerated well No report of concerns or symptoms today Strength training completed today  Goals Unmet:  Not Applicable  Comments: Pt able to follow exercise prescription today without complaint.  Will continue to monitor for progression.    Dr. Emily Filbert is Medical Director for [facility].  Dr. Ottie Glazier is Medical Director for [facility] Pulmonary [facility].

## 2022-09-07 ENCOUNTER — Encounter (HOSPITAL_COMMUNITY): Payer: Self-pay | Admitting: Internal Medicine

## 2022-09-17 ENCOUNTER — Encounter: Payer: Self-pay | Admitting: *Deleted

## 2022-09-17 DIAGNOSIS — Z951 Presence of aortocoronary bypass graft: Secondary | ICD-10-CM

## 2022-09-17 DIAGNOSIS — Z955 Presence of coronary angioplasty implant and graft: Secondary | ICD-10-CM

## 2022-09-17 NOTE — Progress Notes (Signed)
Cardiac Individual Treatment Plan  Patient Details  Name: [name] Goodman MRN: [id number] Date of Birth: 10-15-1940 Referring Provider:   Flowsheet Row Cardiac Rehab from 07/15/2022 in [facility] Cardiac and Pulmonary Rehab  Referring Provider Isaias Cowman MD       Initial Encounter Date:  Flowsheet Row Cardiac Rehab from 07/15/2022 in [facility] Cardiac and Pulmonary Rehab  Date 07/15/22       Visit Diagnosis: S/P CABG x 5  Status post coronary artery stent placement  Patient's Home Medications on Admission:  Current Outpatient Medications:    amiodarone (PACERONE) 200 MG tablet, Take 200 mg by mouth daily., Disp: , Rfl:    apixaban (ELIQUIS) 5 MG TABS tablet, Take 1 tablet (5 mg total) by mouth 2 (two) times daily., Disp: 60 tablet, Rfl: 6   atorvastatin (LIPITOR) 80 MG tablet, Take 1 tablet (80 mg total) by mouth daily., Disp: 30 tablet, Rfl: 7   clopidogrel (PLAVIX) 75 MG tablet, Take 1 tablet (75 mg total) by mouth daily., Disp: 30 tablet, Rfl: 7   cyanocobalamin (VITAMIN B12) 1000 MCG tablet, Take 1,000 mcg by mouth daily., Disp: , Rfl:    empagliflozin (JARDIANCE) 10 MG TABS tablet, Take 1 tablet (10 mg total) by mouth daily., Disp: 30 tablet, Rfl: 7   furosemide (LASIX) 20 MG tablet, Take 2 tablets (40 mg total) by mouth every other day AND 1 tablet (20 mg total) every other day., Disp: 60 tablet, Rfl: 6   isosorbide mononitrate (IMDUR) 30 MG 24 hr tablet, Take 1 tablet (30 mg total) by mouth daily., Disp: 30 tablet, Rfl: 6   metoprolol succinate (TOPROL-XL) 100 MG 24 hr tablet, Take 1 tablet (100 mg total) by mouth daily. Take with or immediately following a meal., Disp: 30 tablet, Rfl: 7   sacubitril-valsartan (ENTRESTO) 24-26 MG, Take 1 tablet by mouth 2 (two) times daily., Disp: 60 tablet, Rfl: 11   spironolactone (ALDACTONE) 25 MG tablet, Take 0.5 tablets (12.5 mg total) by mouth daily., Disp: 15 tablet, Rfl: 7  Past Medical History: Past Medical History:   Diagnosis Date   Arthritis    History of kidney stones    Hyperlipidemia    Hypertension    Nocturia    S/P CABG (coronary artery bypass graft)     Tobacco Use: Social History   Tobacco Use  Smoking Status Former   Packs/day: 1.00   Years: 15.00   Total pack years: 15.00   Types: Cigarettes   Quit date: 07/17/1975   Years since quitting: 47.2  Smokeless Tobacco Never    Labs: Review Flowsheet       Latest Ref Rng & Units 05/07/2022 05/08/2022 05/09/2022 05/23/2022  Labs for ITP Cardiac and Pulmonary Rehab  Hemoglobin A1c 4.8 - 5.6 % 6.3  - - -  PH, Arterial 7.35 - 7.45 - 7.388  7.297  7.361  7.388  7.395  7.408  7.463  7.450  7.365  7.401  7.362  7.431   PCO2 arterial 32 - 48 mmHg - 36.6  39.9  38.6  41.4  42.8  38.2  37.3  36.4  50.5  42.7  40.1  42.5   Bicarbonate 20.0 - 28.0 mmol/L - 22.1  19.5  22.1  24.9  26.2  24.1  26.8  25.3  26.8  28.9  26.5  22.8  28.2  29.2  29.5   TCO2 22 - 32 mmol/L - _0 25  _0 Acid-base deficit 0.0 - 2.0 mmol/L - 3.0  7.0  3.0  2.0  -  O2 Saturation % - 97  98  98  100  100  100  100  100  85  100  100  97  96  64  67      Exercise Target Goals: Exercise Program Goal: Individual exercise prescription set using results from initial 6 min walk test and THRR while considering  patient's activity barriers and safety.   Exercise Prescription Goal: Initial exercise prescription builds to 30-45 minutes a day of aerobic activity, 2-3 days per week.  Home exercise guidelines will be given to patient during program as part of exercise prescription that the participant will acknowledge.   Education: Aerobic Exercise: - Group verbal and visual presentation on the components of exercise prescription. Introduces F.I.T.T principle from ACSM for exercise prescriptions.  Reviews F.I.T.T. principles of aerobic exercise including progression. Written material given at graduation. Flowsheet  Row Cardiac Rehab from 07/15/2022 in [facility] Cardiac and Pulmonary Rehab  Education need identified 07/15/22       Education: Resistance Exercise: - Group verbal and visual presentation on the components of exercise prescription. Introduces F.I.T.T principle from ACSM for exercise prescriptions  Reviews F.I.T.T. principles of resistance exercise including progression. Written material given at graduation.    Education: Exercise & Equipment Safety: - Individual verbal instruction and demonstration of equipment use and safety with use of the equipment. Flowsheet Row Cardiac Rehab from 07/15/2022 in [facility] Cardiac and Pulmonary Rehab  Date 07/15/22  Educator NT  Instruction Review Code 1- Verbalizes Understanding       Education: Exercise Physiology & General Exercise Guidelines: - Group verbal and written instruction with models to review the exercise physiology of the cardiovascular system and associated critical values. Provides general exercise guidelines with specific guidelines to those with Goodman or lung disease.    Education: Flexibility, Balance, Mind/Body Relaxation: - Group verbal and visual presentation with interactive activity on the components of exercise prescription. Introduces F.I.T.T principle from ACSM for exercise prescriptions. Reviews F.I.T.T. principles of flexibility and balance exercise training including progression. Also discusses the mind body connection.  Reviews various relaxation techniques to help reduce and manage stress (i.e. Deep breathing, progressive muscle relaxation, and visualization). Balance handout provided to take home. Written material given at graduation.   Activity Barriers & Risk Stratification:  Activity Barriers & Cardiac Risk Stratification - 07/15/22 1005       Activity Barriers & Cardiac Risk Stratification   Activity Barriers Joint Problems;Left Knee Replacement;Right Knee Replacement;Shortness of Breath    Cardiac Risk Stratification  High             6 Minute Walk:  6 Minute Walk     Row Name 07/15/22 1003         6 Minute Walk   Phase Initial     Distance 1260 feet     Walk Time 6 minutes     # of Rest Breaks 0     MPH 2.39     METS 2.39     RPE 7     Perceived Dyspnea  1     VO2 Peak 8.35     Symptoms Yes (comment)     Comments Slight SOB     Resting HR 73 bpm  Resting BP 112/60     Resting Oxygen Saturation  99 %     Exercise Oxygen Saturation  during 6 min walk 95 %     Max Ex. HR 106 bpm     Max Ex. BP 120/72     2 Minute Post BP 114/70              Oxygen Initial Assessment:   Oxygen Re-Evaluation:   Oxygen Discharge (Final Oxygen Re-Evaluation):   Initial Exercise Prescription:  Initial Exercise Prescription - 07/15/22 1000       Date of Initial Exercise RX and Referring Provider   Date 07/15/22    Referring Provider Isaias Cowman MD      Oxygen   Maintain Oxygen Saturation 88% or higher      Treadmill   MPH 2    Grade 0.5    Minutes 15    METs 2.67      Recumbant Bike   Level 1    RPM 50    Watts 12    Minutes 15    METs 2.39      NuStep   Level 2    SPM 80    Minutes 15    METs 2.39      Recumbant Elliptical   Level 1.5    RPM 50    Minutes 15    METs 2.39      REL-XR   Level 1    Speed 50    Minutes 15    METs 2.39      Prescription Details   Frequency (times per week) 3    Duration Progress to 30 minutes of continuous aerobic without signs/symptoms of physical distress      Intensity   THRR 40-80% of Max Heartrate 99-125    Ratings of Perceived Exertion 11-13    Perceived Dyspnea 0-4      Progression   Progression Continue to progress workloads to maintain intensity without signs/symptoms of physical distress.      Resistance Training   Training Prescription Yes    Weight 4 lb    Reps 10-15             Perform Capillary Blood Glucose checks as needed.  Exercise Prescription Changes:   Exercise Prescription  Changes     Row Name 07/15/22 1000 08/04/22 1500 08/19/22 1500 09/01/22 1000 09/02/22 1500     Response to Exercise   Blood Pressure (Admit) 112/60 102/62 112/58 -- 118/64   Blood Pressure (Exercise) 120/72 118/60 146/74 -- 128/68   Blood Pressure (Exit) 114/70 102/80 98/56 -- 118/60   Goodman Rate (Admit) 73 bpm 82 bpm 86 bpm -- 78 bpm   Goodman Rate (Exercise) 106 bpm 103 bpm 105 bpm -- 110 bpm   Goodman Rate (Exit) 87 bpm 83 bpm 99 bpm -- 88 bpm   Oxygen Saturation (Admit) 99 % -- -- -- --   Oxygen Saturation (Exercise) 95 % -- -- -- --   Rating of Perceived Exertion (Exercise) _0 -- 12   Perceived Dyspnea (Exercise) 1 -- -- -- --   Symptoms Slight SOB none none -- none   Comments 6MWT Results 2nd full week of exercise -- -- --   Duration -- Continue with 30 min of aerobic exercise without signs/symptoms of physical distress. Continue with 30 min of aerobic exercise without signs/symptoms of physical distress. -- Continue with 30 min of aerobic exercise without signs/symptoms of physical distress.   Intensity --  THRR unchanged THRR unchanged -- THRR unchanged     Progression   Progression -- Continue to progress workloads to maintain intensity without signs/symptoms of physical distress. Continue to progress workloads to maintain intensity without signs/symptoms of physical distress. -- Continue to progress workloads to maintain intensity without signs/symptoms of physical distress.   Average METs -- 2.72 3.04 -- 3.61     Resistance Training   Training Prescription -- Yes Yes -- Yes   Weight -- 4 lb 4 lb -- 4 lb   Reps -- 10-15 10-15 -- 10-15     Interval Training   Interval Training -- No No -- No     Treadmill   MPH -- 2.3 2.5 -- 2.5   Grade -- 0.5 1 -- 1.5   Minutes -- 15 15 -- 15   METs -- 2.92 3.26 -- 3.43     Recumbant Bike   Level -- 3 -- -- --   Watts -- 20 -- -- --   Minutes -- 15 -- -- --     NuStep   Level -- 4 2 -- 3   Minutes -- 15 15 -- 15   METs -- 4.4  3.9 -- 3.8     Recumbant Elliptical   Level -- 1 1 -- --   Minutes -- 15 15 -- --   METs -- 1.7 1.7 -- --     REL-XR   Level -- -- 3 -- --   Minutes -- -- 15 -- --     Home Exercise Plan   Plans to continue exercise at -- -- -- Home (comment)  walking outdoors; walking at mall for indoors Home (comment)  walking outdoors; walking at mall for indoors   Frequency -- -- -- Add 2 additional days to program exercise sessions. Add 2 additional days to program exercise sessions.   Initial Home Exercises Provided -- -- -- 09/01/22 09/01/22     Oxygen   Maintain Oxygen Saturation -- -- 88% or higher 88% or higher 88% or higher            Exercise Comments:   Exercise Comments     Row Name 07/21/22 0953           Exercise Comments First full day of exercise!  Patient was oriented to gym and equipment including functions, settings, policies, and procedures.  Patient's individual exercise prescription and treatment plan were reviewed.  All starting workloads were established based on the results of the 6 minute walk test done at initial orientation visit.  The plan for exercise progression was also introduced and progression will be customized based on patient's performance and goals.                Exercise Goals and Review:   Exercise Goals     Row Name 07/15/22 1022             Exercise Goals   Increase Physical Activity Yes       Intervention Develop an individualized exercise prescription for aerobic and resistive training based on initial evaluation findings, risk stratification, comorbidities and participant's personal goals.;Provide advice, education, support and counseling about physical activity/exercise needs.       Expected Outcomes Long Term: Exercising regularly at least 3-5 days a week.;Long Term: Add in home exercise to make exercise part of routine and to increase amount of physical activity.;Short Term: Attend rehab on a regular basis to increase amount of  physical activity.  Increase Strength and Stamina Yes       Intervention Develop an individualized exercise prescription for aerobic and resistive training based on initial evaluation findings, risk stratification, comorbidities and participant's personal goals.;Provide advice, education, support and counseling about physical activity/exercise needs.       Expected Outcomes Long Term: Improve cardiorespiratory fitness, muscular endurance and strength as measured by increased METs and functional capacity (6MWT);Short Term: Increase workloads from initial exercise prescription for resistance, speed, and METs.;Short Term: Perform resistance training exercises routinely during rehab and add in resistance training at home       Able to understand and use rate of perceived exertion (RPE) scale Yes       Intervention Provide education and explanation on how to use RPE scale       Expected Outcomes Short Term: Able to use RPE daily in rehab to express subjective intensity level;Long Term:  Able to use RPE to guide intensity level when exercising independently       Able to understand and use Dyspnea scale Yes       Intervention Provide education and explanation on how to use Dyspnea scale       Expected Outcomes Long Term: Able to use Dyspnea scale to guide intensity level when exercising independently;Short Term: Able to use Dyspnea scale daily in rehab to express subjective sense of shortness of breath during exertion       Knowledge and understanding of Target Goodman Rate Range (THRR) Yes       Intervention Provide education and explanation of THRR including how the numbers were predicted and where they are located for reference       Expected Outcomes Short Term: Able to state/look up THRR;Long Term: Able to use THRR to govern intensity when exercising independently;Short Term: Able to use daily as guideline for intensity in rehab       Able to check pulse independently Yes       Intervention Provide  education and demonstration on how to check pulse in carotid and radial arteries.;Review the importance of being able to check your own pulse for safety during independent exercise       Expected Outcomes Short Term: Able to explain why pulse checking is important during independent exercise;Long Term: Able to check pulse independently and accurately       Understanding of Exercise Prescription Yes       Intervention Provide education, explanation, and written materials on patient's individual exercise prescription       Expected Outcomes Short Term: Able to explain program exercise prescription;Long Term: Able to explain home exercise prescription to exercise independently                Exercise Goals Re-Evaluation :  Exercise Goals Re-Evaluation     Row Name 07/21/22 7035 08/04/22 1554 08/05/22 1047 08/19/22 1526 09/01/22 1039     Exercise Goal Re-Evaluation   Exercise Goals Review Able to understand and use rate of perceived exertion (RPE) scale;Able to understand and use Dyspnea scale;Knowledge and understanding of Target Goodman Rate Range (THRR);Understanding of Exercise Prescription Increase Physical Activity;Increase Strength and Stamina;Understanding of Exercise Prescription Increase Physical Activity;Increase Strength and Stamina;Understanding of Exercise Prescription Increase Physical Activity;Increase Strength and Stamina;Understanding of Exercise Prescription Increase Physical Activity;Increase Strength and Stamina;Understanding of Exercise Prescription   Comments Reviewed RPE scale, THR and program prescription with pt today.  Pt voiced understanding and was given a copy of goals to take home. [name] is doing well for the first couple  of sessions he has been here.  He has already increased to level 4 on the T4 Nustep and added in a 0.5% incline on the treadmill. We will encourage patient to increase to level 1.5 on the REL as that was his starting initial exercise load. He is hitting  his THR all session so far. We will continue to monitor. [name] is off to a good start in rehab.  He is already walking some on his off days and feeling good.  We will review home exercise guidelines with him soon. [name] continues to do well in rehab. He recently increased his overall average MET level to 3.04 METs. He also was able to increase his workload n the treadmill to a speed of 2.5 mph and an incline of 1%. He improved to level 3 on the XR as well. We will continue to monitor his progress in the program. Reviewed home exercise with pt today.  Pt plans to walk for exercise.  Patient walks for 2 miles every day. If the weather does not permit, he will walk indoors in the mall. He was encouraged to incorporate more weight training into his exercise routine. He has 10 lb handweights at his home. Reviewed THR, pulse, RPE, sign and symptoms, pulse oximetery and when to call 911 or MD.  Also discussed weather considerations and indoor options.  Pt voiced understanding.   Expected Outcomes Short: Use RPE daily to regulate intensity.  Long: Follow program prescription in THR. Short: Increase REL to 1.5 level Long: Continue to increase overall MET level Short: Continue to attend rehab regularly Long: Conitnue to improve stamina Short: Continue to increase workloads as tolerated. Long: Conitnue to increase strength and stamina. Short: Check HR during walking Long: Continue to exercise independently at home    [place] Name 09/02/22 1504             Exercise Goal Re-Evaluation   Exercise Goals Review Increase Physical Activity;Increase Strength and Stamina;Understanding of Exercise Prescription       Comments [name] has been a little sporadic with his exercise sessions as he has been [place] a [place] to the [place]. He is traveling again next week and then is out for the holidays. Reiterated attendance and hope to see a more stable schedule after the holidays. With 1 session to review, patient did increase to a 1.5%  incline on the treadmill and almost worked tp 4 METS on the T4 Nustep. Will continue to monitor.       Expected Outcomes Short: Maintain good attendance Long: Graduate from the [facility] program                Discharge Exercise Prescription (Final Exercise Prescription Changes):  Exercise Prescription Changes - 09/02/22 1500       Response to Exercise   Blood Pressure (Admit) 118/64    Blood Pressure (Exercise) 128/68    Blood Pressure (Exit) 118/60    Goodman Rate (Admit) 78 bpm    Goodman Rate (Exercise) 110 bpm    Goodman Rate (Exit) 88 bpm    Rating of Perceived Exertion (Exercise) 12    Symptoms none    Duration Continue with 30 min of aerobic exercise without signs/symptoms of physical distress.    Intensity THRR unchanged      Progression   Progression Continue to progress workloads to maintain intensity without signs/symptoms of physical distress.    Average METs 3.61      Resistance Training   Training Prescription Yes  Weight 4 lb    Reps 10-15      Interval Training   Interval Training No      Treadmill   MPH 2.5    Grade 1.5    Minutes 15    METs 3.43      NuStep   Level 3    Minutes 15    METs 3.8      Home Exercise Plan   Plans to continue exercise at Home (comment)   walking outdoors; walking at mall for indoors   Frequency Add 2 additional days to program exercise sessions.    Initial Home Exercises Provided 09/01/22      Oxygen   Maintain Oxygen Saturation 88% or higher             Nutrition:  Target Goals: Understanding of nutrition guidelines, daily intake of sodium <1573m, cholesterol <2070m calories 30% from fat and 7% or less from saturated fats, daily to have 5 or more servings of fruits and vegetables.  Education: All About Nutrition: -Group instruction provided by verbal, written material, interactive activities, discussions, models, and posters to present general guidelines for Goodman healthy nutrition including fat, fiber,  MyPlate, the role of sodium in Goodman healthy nutrition, utilization of the nutrition label, and utilization of this knowledge for meal planning. Follow up email sent as well. Written material given at graduation. Flowsheet [facility]  Education need identified 07/15/22       Biometrics:  Pre Biometrics - 07/15/22 1023       Pre Biometrics   Height 6' 1.5" (1.867 m)    Weight 201 lb 12.8 oz (91.5 kg)    Waist Circumference 39 inches    Hip Circumference 41.5 inches    Waist to Hip Ratio 0.94 %    BMI (Calculated) 26.26    Single Leg Stand 7.1 seconds   L             Nutrition Therapy Plan and Nutrition Goals:  Nutrition Therapy & Goals - 07/15/22 0926       Nutrition Therapy   Diet Goodman healthy, low Na    Drug/Food Interactions Statins/Certain Fruits    Protein (specify units) 90-100g    Fiber 28 grams    Whole Grain Foods 3 servings    Saturated Fats 16 max. grams    Fruits and Vegetables 8 servings/day   8 ideal, but he eats smaller amounts of food during the day.   Sodium 1.5 grams      Personal Nutrition Goals   Nutrition Goal ST: consistenly eat at least 1 serving of non-starcy vegetables at dinner time, continue to read food labels for sodium, include at least 1-2 additional sources of protein for the day (easier examples inlcudes beans/lentils, peanut butter, greek yogurt, boiled eggs, or nutritional shake like boost or ensure) LT: prevent fluid retention by keeping Na <1.5-2g/day, meet protein and calorie needs, include at least 1 fruit/ non-starchy vegetable each meal    Comments [age].o. M admitted to cardiac rehab s/p CABG x5. PMHx includes HTN, HLD, arthritis, BPH, nephrolithiasis, CHF, CAD, TKR. Relevant medications includes lipitor, vit B12, jardiance, furosemide, MVI with minerals, tramadol. BMP 07/14/22 showed GFR 4054min, serum creatinine 1.69 mg/dL, and BUN 26 mg/dL.Bill reports no longer using the  salt shaker or using many canned foods. Bill reports his eating patterns vary. B: rasin bran with toast and coffee or biscuit from [employer] L: nothing usually but  will sometimes eat jello or fruit. Sometimes he will eat 1/2 sandwich. D: he reports cooking most meals: steak, hamburger (96% lean), pork chops, fish, chicken with vegetables (including mixed vegetables, salads, and broccoli) and potatoes. He feels he should eat more vegetables as he doesn't always like cooking for one person - suggested frozen vegetables as an option. He also reports liking pasta and he looks at the sodium for the sauce. S:jello with fruit or cups of fruit. Drinks: coffee in am and water as well as cranberry juice (a couple of glasses). He also likes to snack on unsalted nuts during the day. He reports his weight has been stable 212 lbs before this CABG x5 and now he is 190-195 lbs; he feels some of this is fluid as he felt that they pulled a lot of fluid off of him in the hospital. He reports his strength is about the same, but his energy has been lower - he walks 2 miles every morning and now will have to sit down and rest afterwards, previously he did not have to. He reports his significant other is invested in his health, but she was not able to come today. Discussed Goodman healthy and modest changes to diet including increasing protein as his dinner is his main source during the day. Encouraged easier ways to include protein such as beans/lentils, peanut butter, greek yogurt, boiled eggs, or nutritional shake like boost or ensure as he eats smaller amounts of food.      Intervention Plan   Intervention Nutrition handout(s) given to patient.;Prescribe, educate and counsel regarding individualized specific dietary modifications aiming towards targeted core components such as weight, hypertension, lipid management, diabetes, Goodman failure and other comorbidities.    Expected Outcomes Short Term Goal: Understand basic principles  of dietary content, such as calories, fat, sodium, cholesterol and nutrients.;Short Term Goal: A plan has been developed with personal nutrition goals set during dietitian appointment.;Long Term Goal: Adherence to prescribed nutrition plan.             Nutrition Assessments:  MEDIFICTS Score Key: ?70 Need to make dietary changes  40-70 Goodman Healthy Diet ? 40 Therapeutic Level Cholesterol Diet  Flowsheet Row Cardiac Rehab from 07/15/2022 in [facility] Cardiac and Pulmonary Rehab  Picture Your Plate Total Score on Admission 68      Picture Your Plate Scores: <44 Unhealthy dietary pattern with much room for improvement. 41-50 Dietary pattern unlikely to meet recommendations for good health and room for improvement. 51-60 More healthful dietary pattern, with some room for improvement.  >60 Healthy dietary pattern, although there may be some specific behaviors that could be improved.    Nutrition Goals Re-Evaluation:  Nutrition Goals Re-Evaluation     [place] Name 08/05/22 1049 09/01/22 1053           Goals   Nutrition Goal ST: consistenly eat at least 1 serving of non-starcy vegetables at dinner time, continue to read food labels for sodium, include at least 1-2 additional sources of protein for the day (easier examples inlcudes beans/lentils, peanut butter, greek yogurt, boiled eggs, or nutritional shake like boost or ensure) LT: prevent fluid retention by keeping Na <1.5-2g/day, meet protein and calorie needs, include at least 1 fruit/ non-starchy vegetable each meal ST: consistenly eat at least 1 serving of non-starcy vegetables at dinner time, continue to read food labels for sodium, include at least 1-2 additional sources of protein for the day (easier examples inlcudes beans/lentils, peanut butter, greek yogurt, boiled eggs, or  nutritional shake like boost or ensure) LT: prevent fluid retention by keeping Na <1.5-2g/day, meet protein and calorie needs, include at least 1 fruit/  non-starchy vegetable each meal      Comment [name] is doing well about watching his salt intake.  He is trying to eat more salads to increase fiber and non starchy vegetables.  He is a big fan of pinto beans and cabbage.  He is getting more protein and trying some alternatives as well. [name] states the biggest change he is primarily focused on right now is reducing his salt intake. 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 Has not really focused on the intake of non-starchy begetables but has been eating more beans and nuts for protein but is not sure how much of protein he is intaking beyond that. Encouraged to incorporate the examples that the RD recommended. He travels a lot to see his partner and tried to make him aware of protein sources he can take with him on the go (shakes, nuts, etc).      Expected Outcome Short: Conitnue to limit salt intake Long: Continue to add in more protein Short: continue to watch labels for sodium levels Long: Continue to eat Goodman healthy diet               Nutrition Goals Discharge (Final Nutrition Goals Re-Evaluation):  Nutrition Goals Re-Evaluation - 09/01/22 1053       Goals   Nutrition Goal ST: consistenly eat at least 1 serving of non-starcy vegetables at dinner time, continue to read food labels for sodium, include at least 1-2 additional sources of protein for the day (easier examples inlcudes beans/lentils, peanut butter, greek yogurt, boiled eggs, or nutritional shake like boost or ensure) LT: prevent fluid retention by keeping Na <1.5-2g/day, meet protein and calorie needs, include at least 1 fruit/ non-starchy vegetable each meal    Comment Bill states the biggest change he is primarily focused on right now is reducing his salt intake. 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 Has not really  focused on the intake of non-starchy begetables but has been eating more beans and nuts for protein but is not sure how much of protein he is intaking beyond that. Encouraged to incorporate the examples that the RD recommended. He travels a lot to see his partner and tried to make him aware of protein sources he can take with him on the go (shakes, nuts, etc).    Expected Outcome Short: continue to watch labels for sodium levels Long: Continue to eat Goodman healthy diet             Psychosocial: Target Goals: Acknowledge presence or absence of significant depression and/or stress, maximize coping skills, provide positive support system. Participant is able to verbalize types and ability to use techniques and skills needed for reducing stress and depression.   Education: Stress, Anxiety, and Depression - Group verbal and visual presentation to define topics covered.  Reviews how body is impacted by stress, anxiety, and depression.  Also discusses healthy ways to reduce stress and to treat/manage anxiety and depression.  Written material given at graduation.   Education: Sleep Hygiene -Provides group verbal and written instruction about how sleep can affect your health.  Define sleep hygiene, discuss sleep cycles and impact of sleep habits. Review good sleep  hygiene tips.    Initial Review & Psychosocial Screening:  Initial Psych Review & Screening - 07/09/22 1017       Family Dynamics   Good Support System? --   has 3 children.  2 live in area, one in [place]            Quality of Life Scores:   Quality of Life - 07/15/22 0953       Quality of Life   Select Quality of Life      Quality of Life Scores   Health/Function Pre 26 %    Socioeconomic Pre 30 %    Psych/Spiritual Pre 30 %    Family Pre 26.4 %    GLOBAL Pre 27.64 %            Scores of 19 and below usually indicate a poorer quality of life in these areas.  A difference of  2-3 points is a clinically meaningful  difference.  A difference of 2-3 points in the total score of the Quality of Life Index has been associated with significant improvement in overall quality of life, self-image, physical symptoms, and general health in studies assessing change in quality of life.  PHQ-9: Review Flowsheet       07/15/2022  Depression screen PHQ 2/9  Decreased Interest 0  Down, Depressed, Hopeless 0  PHQ - 2 Score 0  Altered sleeping 0  Tired, decreased energy 1  Change in appetite 0  Feeling bad or failure about yourself  0  Trouble concentrating 0  Moving slowly or fidgety/restless 0  Suicidal thoughts 0  PHQ-9 Score 1  Difficult doing work/chores Not difficult at all   Interpretation of Total Score  Total Score Depression Severity:  1-4 = Minimal depression, 5-9 = Mild depression, 10-14 = Moderate depression, 15-19 = Moderately severe depression, 20-27 = Severe depression   Psychosocial Evaluation and Intervention:  Psychosocial Evaluation - 07/09/22 1018       Psychosocial Evaluation & Interventions   Interventions Encouraged to exercise with the program and follow exercise prescription    Comments [name] has no barriers to attending the program. He lives alone. He does have a significant other , Alice, that has been with his 29 years. He also has 3 children;2 live in the area and one lives in [place]. He also has many friends for support. He wants to get back on the golf course.    Expected Outcomes Bill attends all scheduled sessions, he is able to progress with his exercise beyond the 2 miles he is already walking. He will return to playing golf. LTG [name] continues his exercise regimen and playing golf as he wishes    Continue Psychosocial Services  Follow up required by staff             Psychosocial Re-Evaluation:  Psychosocial Re-Evaluation     [place] Name 08/05/22 1048 09/01/22 1042           Psychosocial Re-Evaluation   Current issues with Current Stress Concerns Current  Stress Concerns      Comments [name] is doing well in rehab.  He is feeling good mentally.  His health is his biggest stressor, but he does his best to try not to let it get to him.  He is a good sleeper for most part. [name] continues to do well mentally. He is getting a cardioversion tomorrow and is anxious to see how it takes. After he is cleared, he plans to go to [facility]  [place] the [place] to visit his fiance who he has been engaged to for 30 years. She is a good support for him. He denies any other problems with sleep or mental health at this time.      Expected Outcomes Short: COnitnue to exercise for mental boost Long:Continue to stay positive Short: Enjoy time in [place]: Continue to maintain positive attitude      Interventions Encouraged to attend Cardiac Rehabilitation for the exercise Encouraged to attend Cardiac Rehabilitation for the exercise      Continue Psychosocial Services  Follow up required by staff Follow up required by staff               Psychosocial Discharge (Final Psychosocial Re-Evaluation):  Psychosocial Re-Evaluation - 09/01/22 1042       Psychosocial Re-Evaluation   Current issues with Current Stress Concerns    Comments [name] continues to do well mentally. He is getting a cardioversion tomorrow and is anxious to see how it takes. After he is cleared, he plans to go to [place] for the mountains to visit his fiance who he has been engaged to for 30 years. She is a good support for him. He denies any other problems with sleep or mental health at this time.    Expected Outcomes Short: Enjoy time in [place]: Continue to maintain positive attitude    Interventions Encouraged to attend Cardiac Rehabilitation for the exercise    Continue Psychosocial Services  Follow up required by staff             Vocational Rehabilitation: Provide vocational rehab assistance to qualifying candidates.   Vocational Rehab Evaluation & Intervention:  Vocational  Rehab - 07/09/22 1024       Initial Vocational Rehab Evaluation & Intervention   Assessment shows need for Vocational Rehabilitation No      Vocational Rehab Re-Evaulation   Comments RETIRED             Education: Education Goals: Education classes will be provided on a variety of topics geared toward better understanding of Goodman health and risk factor modification. Participant will state understanding/return demonstration of topics presented as noted by education test scores.  Learning Barriers/Preferences:  Learning Barriers/Preferences - 07/09/22 1012       Learning Barriers/Preferences   Learning Barriers None    Learning Preferences None             General Cardiac Education Topics:  AED/CPR: - Group verbal and written instruction with the use of models to demonstrate the basic use of the AED with the basic ABC's of resuscitation.   Anatomy and Cardiac Procedures: - Group verbal and visual presentation and models provide information about basic cardiac anatomy and function. Reviews the testing methods done to diagnose Goodman disease and the outcomes of the test results. Describes the treatment choices: Medical Management, Angioplasty, or Coronary Bypass Surgery for treating various Goodman conditions including Myocardial Infarction, Angina, Valve Disease, and Cardiac Arrhythmias.  Written material given at graduation. Flowsheet Row Cardiac Rehab from 07/15/2022 in [facility] Cardiac and Pulmonary Rehab  Education need identified 07/15/22       Medication Safety: - Group verbal and visual instruction to review commonly prescribed medications for Goodman and lung disease. Reviews the medication, class of the drug, and side effects. Includes the steps to properly store meds and maintain the prescription regimen.  Written material given at graduation.   Intimacy: - Group verbal instruction through game format to discuss how Goodman and lung  disease can affect sexual intimacy.  Written material given at graduation..   Know Your Numbers and Goodman Failure: - Group verbal and visual instruction to discuss disease risk factors for cardiac and pulmonary disease and treatment options.  Reviews associated critical values for Overweight/Obesity, Hypertension, Cholesterol, and Diabetes.  Discusses basics of Goodman failure: signs/symptoms and treatments.  Introduces Goodman Failure Zone chart for action plan for Goodman failure.  Written material given at graduation.   Infection Prevention: - Provides verbal and written material to individual with discussion of infection control including proper hand washing and proper equipment cleaning during exercise session. Flowsheet Row Cardiac Rehab from 07/15/2022 in [facility] Cardiac and Pulmonary Rehab  Date 07/15/22  Educator NT  Instruction Review Code 1- Verbalizes Understanding       Falls Prevention: - Provides verbal and written material to individual with discussion of falls prevention and safety. Flowsheet Row Cardiac Rehab from 07/15/2022 in [facility] Cardiac and Pulmonary Rehab  Date 07/09/22  Educator SB  Instruction Review Code 1- Verbalizes Understanding       Other: -Provides group and verbal instruction on various topics (see comments)   Knowledge Questionnaire Score:  Knowledge Questionnaire Score - 07/15/22 0956       Knowledge Questionnaire Score   Pre Score 21/26             Core Components/Risk Factors/Patient Goals at Admission:  Personal Goals and Risk Factors at Admission - 07/15/22 0956       Core Components/Risk Factors/Patient Goals on Admission    Weight Management Yes    Intervention Weight Management: Develop a combined nutrition and exercise program designed to reach desired caloric intake, while maintaining appropriate intake of nutrient and fiber, sodium and fats, and appropriate energy expenditure required for the weight goal.;Weight Management/Obesity: Establish reasonable short term and  long term weight goals.;Weight Management: Provide education and appropriate resources to help participant work on and attain dietary goals.    Admit Weight 201 lb 12.8 oz (91.5 kg)    Goal Weight: Short Term 195 lb (88.5 kg)    Goal Weight: Long Term 190 lb (86.2 kg)    Expected Outcomes Short Term: Continue to assess and modify interventions until short term weight is achieved;Long Term: Adherence to nutrition and physical activity/exercise program aimed toward attainment of established weight goal;Weight Maintenance: Understanding of the daily nutrition guidelines, which includes 25-35% calories from fat, 7% or less cal from saturated fats, less than 23m cholesterol, less than 1.5gm of sodium, & 5 or more servings of fruits and vegetables daily    Goodman Failure Yes    Intervention Provide a combined exercise and nutrition program that is supplemented with education, support and counseling about Goodman failure. Directed toward relieving symptoms such as shortness of breath, decreased exercise tolerance, and extremity edema.    Expected Outcomes Improve functional capacity of life;Short term: Attendance in program 2-3 days a week with increased exercise capacity. Reported lower sodium intake. Reported increased fruit and vegetable intake. Reports medication compliance.;Short term: Daily weights obtained and reported for increase. Utilizing diuretic protocols set by physician.;Long term: Adoption of self-care skills and reduction of barriers for early signs and symptoms recognition and intervention leading to self-care maintenance.    Hypertension Yes    Intervention Provide education on lifestyle modifcations including regular physical activity/exercise, weight management, moderate sodium restriction and increased consumption of fresh fruit, vegetables, and low fat dairy, alcohol moderation, and smoking cessation.;Monitor prescription use compliance.    Expected Outcomes Short Term:  Continued assessment  and intervention until BP is < 140/54m HG in hypertensive participants. < 130/837mHG in hypertensive participants with diabetes, Goodman failure or chronic kidney disease.;Long Term: Maintenance of blood pressure at goal levels.    Lipids Yes    Intervention Provide education and support for participant on nutrition & aerobic/resistive exercise along with prescribed medications to achieve LDL <7073mHDL >49m37m  Expected Outcomes Short Term: Participant states understanding of desired cholesterol values and is compliant with medications prescribed. Participant is following exercise prescription and nutrition guidelines.;Long Term: Cholesterol controlled with medications as prescribed, with individualized exercise RX and with personalized nutrition plan. Value goals: LDL < 70mg7mL > 40 mg.             Education:Diabetes - Individual verbal and written instruction to review signs/symptoms of diabetes, desired ranges of glucose level fasting, after meals and with exercise. Acknowledge that pre and post exercise glucose checks will be done for 3 sessions at entry of program.   Core Components/Risk Factors/Patient Goals Review:   Goals and Risk Factor Review     Row Name 08/05/22 1052 09/01/22 1049           Core Components/Risk Factors/Patient Goals Review   Personal Goals Review Weight Management/Obesity;Hypertension;Goodman Failure Weight Management/Obesity;Hypertension;Goodman Failure      Review Bill Rush Landmarkoing well in rehab. His weight is holding steady and he checks it routinely.  He has not had any Goodman failure symptoms other than fatigue.  His pressures are doing well and he is checking it daily.  He is doing well on his meds overall. Bill Rush Landmarkes he has been checking his weight and has maintained between 192-196 lb. He knows to look for a sudden gain which could possibly be fluid. He denies Goodman failure symptoms and is diligent on his low salt intake. He is taking all of his medications  as precribed. His  BP at home ranges 110-120/ 60s. Sometimes he gets low at rehab but drinks water and recovers well. He is always asymptomatic. He is getting a cardioversion tomorrow and will need clearance to return to rehab. Patient is aware.      Expected Outcomes Short: Continue to monitor Goodman failure symptoms Long: Conitnue to monitor risk factors Short: Get through cardioversion, contacr rehab on his return date Long: Continue to manage lifestyle risk factors               Core Components/Risk Factors/Patient Goals at Discharge (Final Review):   Goals and Risk Factor Review - 09/01/22 1049       Core Components/Risk Factors/Patient Goals Review   Personal Goals Review Weight Management/Obesity;Hypertension;Goodman Failure    Review Bill Rush Landmarkes he has been checking his weight and has maintained between 192-196 lb. He knows to look for a sudden gain which could possibly be fluid. He denies Goodman failure symptoms and is diligent on his low salt intake. He is taking all of his medications as precribed. His  BP at home ranges 110-120/ 60s. Sometimes he gets low at rehab but drinks water and recovers well. He is always asymptomatic. He is getting a cardioversion tomorrow and will need clearance to return to rehab. Patient is aware.    Expected Outcomes Short: Get through cardioversion, contacr rehab on his return date Long: Continue to manage lifestyle risk factors             ITP Comments:  ITP Comments     Row Name 07/09/22 1024 07/15/22 0946 07/21/22  5790 07/23/22 0954 08/20/22 0825   ITP Comments Virtual orientation call completed today. he has an appointment on Date: 07/15/2022  for EP eval and gym Orientation.  Documentation of diagnosis can be found in [facility] 05/18/2022 and 05/08/2022 . Completed 6MWT and gym orientation. Initial ITP created and sent for review to Dr. Emily Filbert, Medical Director. First full day of exercise!  Patient was oriented to gym and equipment including  functions, settings, policies, and procedures.  Patient's individual exercise prescription and treatment plan were reviewed.  All starting workloads were established based on the results of the 6 minute walk test done at initial orientation visit.  The plan for exercise progression was also introduced and progression will be customized based on patient's performance and goals. 30 Day review completed. Medical Director ITP review done, changes made as directed, and signed approval by Medical Director.    NEW TO PROGRAM 30 Day review completed. Medical Director ITP review done, changes made as directed, and signed approval by Medical Director.    [place] Name 09/17/22 1055           ITP Comments 30 Day review completed. Medical Director ITP review done, changes made as directed, and signed approval by Medical Director.                Comments:

## 2022-09-24 ENCOUNTER — Encounter: Payer: PPO | Attending: Cardiology | Admitting: *Deleted

## 2022-09-24 DIAGNOSIS — Z955 Presence of coronary angioplasty implant and graft: Secondary | ICD-10-CM | POA: Insufficient documentation

## 2022-09-24 DIAGNOSIS — Z951 Presence of aortocoronary bypass graft: Secondary | ICD-10-CM | POA: Insufficient documentation

## 2022-09-24 NOTE — Progress Notes (Signed)
Daily Session Note  Patient Details  Name: RAYMONE PEMBROKE MRN: [id number] Date of Birth: 1940/11/24 Referring Provider:   Flowsheet Row Cardiac Rehab from 07/15/2022 in [facility] Cardiac and Pulmonary Rehab  Referring Provider Isaias Cowman MD       Encounter Date: 09/24/2022  Check In:  Session Check In - 09/24/22 1005       Check-In   Supervising physician immediately available to respond to emergencies See telemetry face sheet for immediately available ER MD    Location [facility]-Cardiac & Pulmonary Rehab    Staff Present Darlyne Russian, RN, Dimple Nanas, BS, Exercise Physiologist;Meredith Sherryll Burger, RN BSN    Virtual Visit No    Medication changes reported     No    Fall or balance concerns reported    No    Warm-up and Cool-down Performed on first and last piece of equipment    Resistance Training Performed Yes    VAD Patient? No    PAD/SET Patient? No      Pain Assessment   Currently in Pain? No/denies                Social History   Tobacco Use  Smoking Status Former   Packs/day: 1.00   Years: 15.00   Total pack years: 15.00   Types: Cigarettes   Quit date: 07/17/1975   Years since quitting: 47.2  Smokeless Tobacco Never    Goals Met:  Independence with exercise equipment Exercise tolerated well No report of concerns or symptoms today Strength training completed today  Goals Unmet:  Not Applicable  Comments: Pt able to follow exercise prescription today without complaint.  Will continue to monitor for progression.    Dr. Emily Filbert is Medical Director for [facility].  Dr. Ottie Glazier is Medical Director for [facility] Pulmonary Rehabilitation.

## 2022-09-29 ENCOUNTER — Encounter: Payer: PPO | Admitting: *Deleted

## 2022-09-29 DIAGNOSIS — Z951 Presence of aortocoronary bypass graft: Secondary | ICD-10-CM

## 2022-09-29 DIAGNOSIS — Z955 Presence of coronary angioplasty implant and graft: Secondary | ICD-10-CM

## 2022-09-29 NOTE — Progress Notes (Signed)
Daily Session Note  Patient Details  Name: [name] Goodman MRN: [id number] Date of Birth: Mar 22, 1941 Referring Provider:   Flowsheet Row Cardiac Rehab from 07/15/2022 in [facility] Cardiac and Pulmonary Rehab  Referring Provider Isaias Cowman MD       Encounter Date: 09/29/2022  Check In:  Session Check In - 09/29/22 1011       Check-In   Supervising physician immediately available to respond to emergencies See telemetry face sheet for immediately available ER MD    Location [facility]-Cardiac & Pulmonary Rehab    Staff Present Darlyne Russian, RN, Doyce Para, BS, ACSM CEP, Exercise Physiologist;Noah Tickle, BS, Exercise Physiologist    Virtual Visit No    Medication changes reported     No    Fall or balance concerns reported    No    Warm-up and Cool-down Performed on first and last piece of equipment    Resistance Training Performed Yes    VAD Patient? No    PAD/SET Patient? No      Pain Assessment   Currently in Pain? No/denies                Social History   Tobacco Use  Smoking Status Former   Packs/day: 1.00   Years: 15.00   Total pack years: 15.00   Types: Cigarettes   Quit date: 07/17/1975   Years since quitting: 47.2  Smokeless Tobacco Never    Goals Met:  Independence with exercise equipment Exercise tolerated well No report of concerns or symptoms today Strength training completed today  Goals Unmet:  Not Applicable  Comments: Pt able to follow exercise prescription today without complaint.  Will continue to monitor for progression.    Dr. Emily Goodman is Medical Director for [place] [facility].  Dr. Ottie Goodman is Medical Director for [facility] Pulmonary Rehabilitation.

## 2022-10-01 ENCOUNTER — Encounter: Payer: PPO | Admitting: *Deleted

## 2022-10-01 DIAGNOSIS — Z951 Presence of aortocoronary bypass graft: Secondary | ICD-10-CM

## 2022-10-01 DIAGNOSIS — Z955 Presence of coronary angioplasty implant and graft: Secondary | ICD-10-CM

## 2022-10-01 NOTE — Progress Notes (Signed)
Daily Session Note  Patient Details  Name: [name] Goodman MRN: [id number] Date of Birth: 1941/06/08 Referring Provider:   Flowsheet Row Cardiac Rehab from 07/15/2022 in [facility] Cardiac and Pulmonary Rehab  Referring Provider [name] Cowman MD       Encounter Date: 10/01/2022  Check In:  Session Check In - 10/01/22 1016       Check-In   Supervising physician immediately available to respond to emergencies See telemetry face sheet for immediately available ER MD    Location [facility]-Cardiac & Pulmonary Rehab    Staff Present [name] Russian, RN, ADN;[name] Goodman, RCP,RRT,BSRT;[name] Goodman, BS, Exercise Physiologist    Virtual Visit No    Medication changes reported     No    Fall or balance concerns reported    No    Warm-up and Cool-down Performed on first and last piece of equipment    Resistance Training Performed Yes    VAD Patient? No    PAD/SET Patient? No      Pain Assessment   Currently in Pain? No/denies                Social History   Tobacco Use  Smoking Status Former   Packs/day: 1.00   Years: 15.00   Total pack years: 15.00   Types: Cigarettes   Quit date: 07/17/1975   Years since quitting: 47.2  Smokeless Tobacco Never    Goals Met:  Independence with exercise equipment Exercise tolerated well No report of concerns or symptoms today Strength training completed today  Goals Unmet:  Not Applicable  Comments: Pt able to follow exercise prescription today without complaint.  Will continue to monitor for progression.    Dr. Emily Goodman is Medical Director for [name].  Dr. Ottie Goodman is Medical Director for [name] Pulmonary Rehabilitation.

## 2022-10-06 ENCOUNTER — Ambulatory Visit: Payer: PPO | Attending: Cardiovascular Disease | Admitting: Cardiovascular Disease

## 2022-10-06 ENCOUNTER — Encounter: Payer: Self-pay | Admitting: Cardiovascular Disease

## 2022-10-06 VITALS — BP 124/76 | HR 59 | Ht 73.0 in | Wt 200.8 lb

## 2022-10-06 DIAGNOSIS — I493 Ventricular premature depolarization: Secondary | ICD-10-CM | POA: Diagnosis not present

## 2022-10-06 DIAGNOSIS — I482 Chronic atrial fibrillation, unspecified: Secondary | ICD-10-CM

## 2022-10-06 DIAGNOSIS — Z01812 Encounter for preprocedural laboratory examination: Secondary | ICD-10-CM | POA: Diagnosis not present

## 2022-10-06 NOTE — Patient Instructions (Addendum)
Medication Instructions:  Your physician recommends that you continue on your current medications as directed. Please refer to the Current Medication list given to you today.   *If you need a refill on your cardiac medications before your next appointment, please call your pharmacy*   Lab Work: On December 29, 2022: CBC, BMET (you do not need to be fasting) You may come to the lab any time between 7:30am and 4:30pm. If you have labs (blood work) drawn today and your tests are completely normal, you will receive your results only by: [place] (if you have MyChart) OR A paper copy in the mail If you have any lab test that is abnormal or we need to change your treatment, we will call you to review the results.   Testing/Procedures: Your physician has requested that you have cardiac CT. Cardiac computed tomography (CT) is a painless test that uses an x-ray machine to take clear, detailed pictures of your heart. For further information please visit [email or web address]. Please follow instruction sheet as given.   Your physician has recommended that you have an ablation. Catheter ablation is a medical procedure used to treat some cardiac arrhythmias (irregular heartbeats). During catheter ablation, a long, thin, flexible tube is put into a blood vessel in your groin (upper thigh), or neck. This tube is called an ablation catheter. It is then guided to your heart through the blood vessel. Radio frequency waves destroy small areas of heart tissue where abnormal heartbeats may cause an arrhythmia to start.    Your ablation procedure is scheduled for Monday January 12, 2023 with Dr. Doralee Albino at 07:30 am. Dennis Bast will arrive at [facility]. [facility] (main entrance A) at 05:30 am.     Follow-Up: At [facility], you and your health needs are our priority.  As part of our continuing mission to provide you with exceptional heart care, we have created designated Provider Care Teams.  These Care  Teams include your primary Cardiologist (physician) and Advanced Practice Providers (APPs -  Physician Assistants and Nurse Practitioners) who all work together to provide you with the care you need, when you need it.   Your next appointment:   4 week(s) after your ablation   Provider:   You will follow up in the [facility] located at [facility]. Your provider will be: Roderic Palau, NP or Clint R. Fenton, PA-C

## 2022-10-06 NOTE — Addendum Note (Signed)
Addended by: Molli Barrows on: 10/06/2022 11:59 AM   Modules accepted: Orders

## 2022-10-06 NOTE — Progress Notes (Signed)
Electrophysiology Office Note:    Date:  10/06/2022   ID:  [name] Goodman, DOB January 13, 1941, MRN [id number]  PCP:  [place] [facility], [place] Prov[facility] Cardiologist:  Early Osmond, MD Electrophysiologist:  Melida Quitter, MD     Referring MD: Jolaine Artist, MD   History of Present Illness:    [name] Goodman is a 82 y.o. male with a hx listed below, significant for hypertension, hyperlipidemia, coronary disease, ischemic cardiomyopathy, PE, postop atrial fibrillation status post 5 vessel by past grafting in August 2023, referred for arrhythmia management.  Postoperative atrial fibrillation in August 20 5:23 vessel bypass graft surgery.  He was converted to sinus rhythm with amiodarone.  Drug-eluting replaced in the distant RCA and OM1 due to to occlusion of vein graft.  He has recurrence of AF and underwent DC cardioversion in December, 2023. He is maintaining sinus rhythm on amiodarone. He has shortness of breath and fatigue with CHF, but he hasn't noticed a significant change since his cardioversion.  Past Medical History:  Diagnosis Date   Arthritis    History of kidney stones    Hyperlipidemia    Hypertension    Nocturia    S/P CABG (coronary artery bypass graft)     Past Surgical History:  Procedure Laterality Date   APPENDECTOMY     CARDIOVERSION N/A 09/02/2022   Procedure: CARDIOVERSION;  Surgeon: Jolaine Artist, MD;  Location: [place];  Service: Cardiovascular;  Laterality: N/A;   CATARACT EXTRACTION W/PHACO Right 11/27/2020   Procedure: CATARACT EXTRACTION PHACO AND INTRAOCULAR LENS PLACEMENT (IOC) RIGHT VIVITY LENS toric 6.31 00.41.8;  Surgeon: Birder Robson, MD;  Location: [place];  Service: Ophthalmology;  Laterality: Right;   CORONARY ARTERY BYPASS GRAFT N/A 05/08/2022   Procedure: CORONARY ARTERY BYPASS GRAFTING (CABG) TIMES FIVE USING ENDOSCOPICALLY HARVESTED RIGHT GREATER SAPHENOUS VEIN AND LEFT OPEN  RADIAL HARVEST VEIN AND LEFT INTERNALLY MAMMARY ARTERY.;  Surgeon: Melrose Nakayama, MD;  Location: [place];  Service: Open Heart Surgery;  Laterality: N/A;   CORONARY STENT INTERVENTION N/A 05/27/2022   Procedure: CORONARY STENT INTERVENTION;  Surgeon: Belva Crome, MD;  Location: [place] CV [facility];  Service: Cardiovascular;  Laterality: N/A;   INTRAVASCULAR PRESSURE WIRE/FFR STUDY N/A 05/27/2022   Procedure: INTRAVASCULAR PRESSURE WIRE/FFR STUDY;  Surgeon: Belva Crome, MD;  Location: [place] [facility];  Service: Cardiovascular;  Laterality: N/A;   IR THORACENTESIS ASP PLEURAL SPACE W/IMG GUIDE  05/19/2022   JOINT REPLACEMENT     KNEE ARTHROSCOPY Left    KNEE ARTHROSCOPY WITH LATERAL MENISECTOMY Right 11/13/2020   Procedure: Right knee arthroscopy, partial medial and lateral meniscectomy;  Surgeon: Hessie Knows, MD;  Location: [facility];  Service: Orthopedics;  Laterality: Right;   LEFT HEART CATH AND CORONARY ANGIOGRAPHY N/A 04/16/2022   Procedure: LEFT HEART CATH AND CORONARY ANGIOGRAPHY;  Surgeon: Isaias Cowman, MD;  Location: [place] CV [facility];  Service: Cardiovascular;  Laterality: N/A;   POLYPECTOMY     WITH COLONOSCOPY   RADIAL ARTERY HARVEST Left 05/08/2022   Procedure: RADIAL ARTERY HARVEST;  Surgeon: Melrose Nakayama, MD;  Location: [place];  Service: Open Heart Surgery;  Laterality: Left;   RIGHT/LEFT HEART CATH AND CORONARY/GRAFT ANGIOGRAPHY N/A 05/23/2022   Procedure: RIGHT/LEFT HEART CATH AND CORONARY/GRAFT ANGIOGRAPHY;  Surgeon: Belva Crome, MD;  Location: [place] CV [facility];  Service: Cardiovascular;  Laterality: N/A;   TEE WITHOUT CARDIOVERSION N/A 05/08/2022   Procedure: TRANSESOPHAGEAL ECHOCARDIOGRAM (TEE);  Surgeon: Melrose Nakayama, MD;  Location: [place];  Service: Open Heart Surgery;  Laterality: N/A;   TOTAL KNEE ARTHROPLASTY Left 07/23/2015   Procedure: LEFT TOTAL KNEE ARTHROPLASTY;  Surgeon: Gaynelle Arabian, MD;  Location: [facility];  Service:  Orthopedics;  Laterality: Left;   TOTAL KNEE ARTHROPLASTY Right 06/11/2021   Procedure: TOTAL KNEE ARTHROPLASTY;  Surgeon: Hessie Knows, MD;  Location: [facility];  Service: Orthopedics;  Laterality: Right;   TRANSURETHRAL RESECTION OF PROSTATE  ?2015   URETEROSCOPY WITH HOLMIUM LASER LITHOTRIPSY      Current Medications: Current Meds  Medication Sig   amiodarone (PACERONE) 200 MG tablet Take 200 mg by mouth daily.   apixaban (ELIQUIS) 5 MG TABS tablet Take 1 tablet (5 mg total) by mouth 2 (two) times daily.   atorvastatin (LIPITOR) 80 MG tablet Take 1 tablet (80 mg total) by mouth daily.   clopidogrel (PLAVIX) 75 MG tablet Take 1 tablet (75 mg total) by mouth daily.   cyanocobalamin (VITAMIN B12) 1000 MCG tablet Take 1,000 mcg by mouth daily.   empagliflozin (JARDIANCE) 10 MG TABS tablet Take 1 tablet (10 mg total) by mouth daily.   furosemide (LASIX) 20 MG tablet Take 2 tablets (40 mg total) by mouth every other day AND 1 tablet (20 mg total) every other day.   isosorbide mononitrate (IMDUR) 30 MG 24 hr tablet Take 1 tablet (30 mg total) by mouth daily.   metoprolol succinate (TOPROL-XL) 100 MG 24 hr tablet Take 1 tablet (100 mg total) by mouth daily. Take with or immediately following a meal.   sacubitril-valsartan (ENTRESTO) 24-26 MG Take 1 tablet by mouth 2 (two) times daily.   spironolactone (ALDACTONE) 25 MG tablet Take 0.5 tablets (12.5 mg total) by mouth daily.     Allergies:   Patient has no known allergies.   Social History   Socioeconomic History   Marital status: Divorced    Spouse name: Not on file   Number of children: 3   Years of education: Not on file   Highest education level: Master's degree (e.g., MA, MS, MEng, MEd, MSW, MBA)  Occupational History   Occupation: retired Leisure centre manager at [employer]   Smoking status: Former    Packs/day: 1.00    Years: 15.00    Total pack years: 15.00    Types: Cigarettes    Quit date: 07/17/1975    Years since quitting: 47.2    Smokeless tobacco: Never  Vaping Use   Vaping Use: Never used  Substance and Sexual Activity   Alcohol use: No   Drug use: No   Sexual activity: Not on file  Other Topics Concern   Not on file  Social History Narrative   Lives alone   Social Determinants of Health   Financial Resource Strain: Low Risk  (05/20/2022)   Overall Financial Resource Strain (CARDIA)    Difficulty of Paying Living Expenses: Not hard at all  Food Insecurity: No Food Insecurity (05/20/2022)   Hunger Vital Sign    Worried About Running Out of Food in the Last Year: Never true    [place] in the Last Year: Never true  Transportation Needs: No Transportation Needs (05/20/2022)   PRAPARE - Hydrologist (Medical): No    Lack of Transportation (Non-Medical): No  Physical Activity: Not on file  Stress: Not on file  Social Connections: Not on file     Family History: The patient's family history includes Cancer  in his father; Hypertension in his father.  ROS:   Please see the history of present illness.    All other systems reviewed and are negative.  EKGs/Labs/Other Studies Reviewed Today:     TTE: December 11, 2023EF 25-30%. Left and right atria severely dilated  Monitor: 07/2022 100% atrial fibrillation, HR 50-126, avg 76 bpm  EKG:  Last EKG results: today - sinus rhythm with PACs and PVC   Recent Labs: 05/27/2022: Magnesium 2.1 September 01, 2022: ALT 26; B Natriuretic Peptide 1,454.9; BUN 25; Creatinine, Ser 1.78; Hemoglobin 14.4; Platelets 174; Potassium 4.6; Sodium 140; TSH 3.061     Physical Exam:    VS:  BP 124/76   Pulse (!) 59   Ht 6\' 1"  (1.854 m)   Wt 200 lb 12.8 oz (91.1 kg)   SpO2 99%   BMI 26.49 kg/m     Wt Readings from Last 3 Encounters:  10/06/22 200 lb 12.8 oz (91.1 kg)  09-01-2022 197 lb 6.4 oz (89.5 kg)  08/19/22 197 lb (89.4 kg)     GEN:  Well nourished, well developed in no acute distress CARDIAC: RRR, no murmurs, rubs, gallops RESPIRATORY:   Normal work of breathing MUSCULOSKELETAL: no edema    ASSESSMENT & PLAN:    Atrial fibrillation: persistent. EF 25-30%. Maintained on amiodarone. I think ablation is reasonable to DC amiodarone and maintain sinus rhythm in the setting of CHF. We discussed the indication, rationale, logistics, anticipated benefits, and potential risks of the ablation procedure including but not limited to -- bleed at the groin access site, chest pain, damage to nearby organs such as the diaphragm, lungs, or esophagus, need for a drainage tube, or prolonged hospitalization. I explained that the risk for stroke, heart attack, need for open chest surgery, or even death is very low but not zero. he  expressed understanding and wishes to proceed. He will see Dr. 08/21/22 in clinic prior to the ablation and will get a second opinion about the procedure at that time. If there are any concerns, we can discuss alternative therapies. Frequent PVCs: will monitor for now. Recent burden was about 10%.  CHFrEF: following in CHF clinic        Medication Adjustments/Labs and Tests Ordered: Current medicines are reviewed at length with the patient today.  Concerns regarding medicines are outlined above.  No orders of the defined types were placed in this encounter.  No orders of the defined types were placed in this encounter.    Signed, Gala Romney, MD  10/06/2022 11:08 AM    [facility]

## 2022-10-10 ENCOUNTER — Encounter: Payer: Self-pay | Admitting: Cardiovascular Disease

## 2022-10-13 ENCOUNTER — Encounter: Payer: Self-pay | Admitting: *Deleted

## 2022-10-13 ENCOUNTER — Encounter: Payer: PPO | Admitting: *Deleted

## 2022-10-13 DIAGNOSIS — Z951 Presence of aortocoronary bypass graft: Secondary | ICD-10-CM

## 2022-10-13 DIAGNOSIS — Z955 Presence of coronary angioplasty implant and graft: Secondary | ICD-10-CM

## 2022-10-13 NOTE — Progress Notes (Signed)
Cardiac Individual Treatment Plan  Patient Details  Name: [name] Goodman MRN: [id number] Date of Birth: 10-15-1940 Referring Provider:   Flowsheet Row Cardiac Rehab from 07/15/2022 in [facility] Cardiac and Pulmonary Rehab  Referring Provider Isaias Cowman MD       Initial Encounter Date:  Flowsheet Row Cardiac Rehab from 07/15/2022 in [facility] Cardiac and Pulmonary Rehab  Date 07/15/22       Visit Diagnosis: S/P CABG x 5  Status post coronary artery stent placement  Patient's Home Medications on Admission:  Current Outpatient Medications:    amiodarone (PACERONE) 200 MG tablet, Take 200 mg by mouth daily., Disp: , Rfl:    apixaban (ELIQUIS) 5 MG TABS tablet, Take 1 tablet (5 mg total) by mouth 2 (two) times daily., Disp: 60 tablet, Rfl: 6   atorvastatin (LIPITOR) 80 MG tablet, Take 1 tablet (80 mg total) by mouth daily., Disp: 30 tablet, Rfl: 7   clopidogrel (PLAVIX) 75 MG tablet, Take 1 tablet (75 mg total) by mouth daily., Disp: 30 tablet, Rfl: 7   cyanocobalamin (VITAMIN B12) 1000 MCG tablet, Take 1,000 mcg by mouth daily., Disp: , Rfl:    empagliflozin (JARDIANCE) 10 MG TABS tablet, Take 1 tablet (10 mg total) by mouth daily., Disp: 30 tablet, Rfl: 7   furosemide (LASIX) 20 MG tablet, Take 2 tablets (40 mg total) by mouth every other day AND 1 tablet (20 mg total) every other day., Disp: 60 tablet, Rfl: 6   isosorbide mononitrate (IMDUR) 30 MG 24 hr tablet, Take 1 tablet (30 mg total) by mouth daily., Disp: 30 tablet, Rfl: 6   metoprolol succinate (TOPROL-XL) 100 MG 24 hr tablet, Take 1 tablet (100 mg total) by mouth daily. Take with or immediately following a meal., Disp: 30 tablet, Rfl: 7   sacubitril-valsartan (ENTRESTO) 24-26 MG, Take 1 tablet by mouth 2 (two) times daily., Disp: 60 tablet, Rfl: 11   spironolactone (ALDACTONE) 25 MG tablet, Take 0.5 tablets (12.5 mg total) by mouth daily., Disp: 15 tablet, Rfl: 7  Past Medical History: Past Medical History:   Diagnosis Date   Arthritis    History of kidney stones    Hyperlipidemia    Hypertension    Nocturia    S/P CABG (coronary artery bypass graft)     Tobacco Use: Social History   Tobacco Use  Smoking Status Former   Packs/day: 1.00   Years: 15.00   Total pack years: 15.00   Types: Cigarettes   Quit date: 07/17/1975   Years since quitting: 47.2  Smokeless Tobacco Never    Labs: Review Flowsheet       Latest Ref Rng & Units 05/07/2022 05/08/2022 05/09/2022 05/23/2022  Labs for ITP Cardiac and Pulmonary Rehab  Hemoglobin A1c 4.8 - 5.6 % 6.3  - - -  PH, Arterial 7.35 - 7.45 - 7.388  7.297  7.361  7.388  7.395  7.408  7.463  7.450  7.365  7.401  7.362  7.431   PCO2 arterial 32 - 48 mmHg - 36.6  39.9  38.6  41.4  42.8  38.2  37.3  36.4  50.5  42.7  40.1  42.5   Bicarbonate 20.0 - 28.0 mmol/L - 22.1  19.5  22.1  24.9  26.2  24.1  26.8  25.3  26.8  28.9  26.5  22.8  28.2  29.2  29.5   TCO2 22 - 32 mmol/L - _0 25  _0 Acid-base deficit 0.0 - 2.0 mmol/L - 3.0  7.0  3.0  2.0  -  O2 Saturation % - 97  98  98  100  100  100  100  100  85  100  100  97  96  64  67      Exercise Target Goals: Exercise Program Goal: Individual exercise prescription set using results from initial 6 min walk test and THRR while considering  patient's activity barriers and safety.   Exercise Prescription Goal: Initial exercise prescription builds to 30-45 minutes a day of aerobic activity, 2-3 days per week.  Home exercise guidelines will be given to patient during program as part of exercise prescription that the participant will acknowledge.   Education: Aerobic Exercise: - Group verbal and visual presentation on the components of exercise prescription. Introduces F.I.T.T principle from ACSM for exercise prescriptions.  Reviews F.I.T.T. principles of aerobic exercise including progression. Written material given at graduation. Flowsheet  Row Cardiac Rehab from 07/15/2022 in [facility] Cardiac and Pulmonary Rehab  Education need identified 07/15/22       Education: Resistance Exercise: - Group verbal and visual presentation on the components of exercise prescription. Introduces F.I.T.T principle from ACSM for exercise prescriptions  Reviews F.I.T.T. principles of resistance exercise including progression. Written material given at graduation.    Education: Exercise & Equipment Safety: - Individual verbal instruction and demonstration of equipment use and safety with use of the equipment. Flowsheet Row Cardiac Rehab from 07/15/2022 in [facility] Cardiac and Pulmonary Rehab  Date 07/15/22  Educator NT  Instruction Review Code 1- Verbalizes Understanding       Education: Exercise Physiology & General Exercise Guidelines: - Group verbal and written instruction with models to review the exercise physiology of the cardiovascular system and associated critical values. Provides general exercise guidelines with specific guidelines to those with Goodman or lung disease.    Education: Flexibility, Balance, Mind/Body Relaxation: - Group verbal and visual presentation with interactive activity on the components of exercise prescription. Introduces F.I.T.T principle from ACSM for exercise prescriptions. Reviews F.I.T.T. principles of flexibility and balance exercise training including progression. Also discusses the mind body connection.  Reviews various relaxation techniques to help reduce and manage stress (i.e. Deep breathing, progressive muscle relaxation, and visualization). Balance handout provided to take home. Written material given at graduation.   Activity Barriers & Risk Stratification:  Activity Barriers & Cardiac Risk Stratification - 07/15/22 1005       Activity Barriers & Cardiac Risk Stratification   Activity Barriers Joint Problems;Left Knee Replacement;Right Knee Replacement;Shortness of Breath    Cardiac Risk Stratification  High             6 Minute Walk:  6 Minute Walk     Row Name 07/15/22 1003         6 Minute Walk   Phase Initial     Distance 1260 feet     Walk Time 6 minutes     # of Rest Breaks 0     MPH 2.39     METS 2.39     RPE 7     Perceived Dyspnea  1     VO2 Peak 8.35     Symptoms Yes (comment)     Comments Slight SOB     Resting HR 73 bpm  Resting BP 112/60     Resting Oxygen Saturation  99 %     Exercise Oxygen Saturation  during 6 min walk 95 %     Max Ex. HR 106 bpm     Max Ex. BP 120/72     2 Minute Post BP 114/70              Oxygen Initial Assessment:   Oxygen Re-Evaluation:   Oxygen Discharge (Final Oxygen Re-Evaluation):   Initial Exercise Prescription:  Initial Exercise Prescription - 07/15/22 1000       Date of Initial Exercise RX and Referring Provider   Date 07/15/22    Referring Provider Isaias Cowman MD      Oxygen   Maintain Oxygen Saturation 88% or higher      Treadmill   MPH 2    Grade 0.5    Minutes 15    METs 2.67      Recumbant Bike   Level 1    RPM 50    Watts 12    Minutes 15    METs 2.39      NuStep   Level 2    SPM 80    Minutes 15    METs 2.39      Recumbant Elliptical   Level 1.5    RPM 50    Minutes 15    METs 2.39      REL-XR   Level 1    Speed 50    Minutes 15    METs 2.39      Prescription Details   Frequency (times per week) 3    Duration Progress to 30 minutes of continuous aerobic without signs/symptoms of physical distress      Intensity   THRR 40-80% of Max Heartrate 99-125    Ratings of Perceived Exertion 11-13    Perceived Dyspnea 0-4      Progression   Progression Continue to progress workloads to maintain intensity without signs/symptoms of physical distress.      Resistance Training   Training Prescription Yes    Weight 4 lb    Reps 10-15             Perform Capillary Blood Glucose checks as needed.  Exercise Prescription Changes:   Exercise Prescription  Changes     Row Name 07/15/22 1000 08/04/22 1500 08/19/22 1500 09/01/22 1000 09/02/22 1500     Response to Exercise   Blood Pressure (Admit) 112/60 102/62 112/58 -- 118/64   Blood Pressure (Exercise) 120/72 118/60 146/74 -- 128/68   Blood Pressure (Exit) 114/70 102/80 98/56 -- 118/60   Goodman Rate (Admit) 73 bpm 82 bpm 86 bpm -- 78 bpm   Goodman Rate (Exercise) 106 bpm 103 bpm 105 bpm -- 110 bpm   Goodman Rate (Exit) 87 bpm 83 bpm 99 bpm -- 88 bpm   Oxygen Saturation (Admit) 99 % -- -- -- --   Oxygen Saturation (Exercise) 95 % -- -- -- --   Rating of Perceived Exertion (Exercise) _0 -- 12   Perceived Dyspnea (Exercise) 1 -- -- -- --   Symptoms Slight SOB none none -- none   Comments 6MWT Results 2nd full week of exercise -- -- --   Duration -- Continue with 30 min of aerobic exercise without signs/symptoms of physical distress. Continue with 30 min of aerobic exercise without signs/symptoms of physical distress. -- Continue with 30 min of aerobic exercise without signs/symptoms of physical distress.   Intensity --  THRR unchanged THRR unchanged -- THRR unchanged     Progression   Progression -- Continue to progress workloads to maintain intensity without signs/symptoms of physical distress. Continue to progress workloads to maintain intensity without signs/symptoms of physical distress. -- Continue to progress workloads to maintain intensity without signs/symptoms of physical distress.   Average METs -- 2.72 3.04 -- 3.61     Resistance Training   Training Prescription -- Yes Yes -- Yes   Weight -- 4 lb 4 lb -- 4 lb   Reps -- 10-15 10-15 -- 10-15     Interval Training   Interval Training -- No No -- No     Treadmill   MPH -- 2.3 2.5 -- 2.5   Grade -- 0.5 1 -- 1.5   Minutes -- 15 15 -- 15   METs -- 2.92 3.26 -- 3.43     Recumbant Bike   Level -- 3 -- -- --   Watts -- 20 -- -- --   Minutes -- 15 -- -- --     NuStep   Level -- 4 2 -- 3   Minutes -- 15 15 -- 15   METs -- 4.4  3.9 -- 3.8     Recumbant Elliptical   Level -- 1 1 -- --   Minutes -- 15 15 -- --   METs -- 1.7 1.7 -- --     REL-XR   Level -- -- 3 -- --   Minutes -- -- 15 -- --     Home Exercise Plan   Plans to continue exercise at -- -- -- Home (comment)  walking outdoors; walking at mall for indoors Home (comment)  walking outdoors; walking at mall for indoors   Frequency -- -- -- Add 2 additional days to program exercise sessions. Add 2 additional days to program exercise sessions.   Initial Home Exercises Provided -- -- -- 09/01/22 09/01/22     Oxygen   Maintain Oxygen Saturation -- -- 88% or higher 88% or higher 88% or higher    Row Name 09/17/22 1100 09/30/22 1300           Response to Exercise   Blood Pressure (Admit) 124/66 122/62      Blood Pressure (Exercise) 128/58 128/64      Blood Pressure (Exit) 98/56 112/60      Goodman Rate (Admit) 66 bpm 66 bpm      Goodman Rate (Exercise) 104 bpm 91 bpm      Goodman Rate (Exit) 69 bpm 65 bpm      Rating of Perceived Exertion (Exercise) 14 12      Symptoms none none      Duration Continue with 30 min of aerobic exercise without signs/symptoms of physical distress. Continue with 30 min of aerobic exercise without signs/symptoms of physical distress.      Intensity THRR unchanged THRR unchanged        Progression   Progression Continue to progress workloads to maintain intensity without signs/symptoms of physical distress. Continue to progress workloads to maintain intensity without signs/symptoms of physical distress.      Average METs 2.93 2.77        Resistance Training   Training Prescription Yes Yes      Weight 4 lb 4 lb      Reps 10-15 10-15        Interval Training   Interval Training No No        Treadmill   MPH 2.5 2.5  Grade 1 0      Minutes 15 15      METs 3.26 2.91        Recumbant Bike   Level -- 2      Watts -- 18      Minutes -- 15      METs -- 2.63        NuStep   Level 3 --      Minutes 15 --      METs  3.6 --        Recumbant Elliptical   Level 1 --      Minutes 15 --      METs 1.7 --        Home Exercise Plan   Plans to continue exercise at Home (comment)  walking outdoors; walking at mall for indoors Home (comment)  walking outdoors; walking at mall for indoors      Frequency Add 2 additional days to program exercise sessions. Add 2 additional days to program exercise sessions.      Initial Home Exercises Provided 09/01/22 09/01/22        Oxygen   Maintain Oxygen Saturation 88% or higher 88% or higher               Exercise Comments:   Exercise Comments     Row Name 07/21/22 0953           Exercise Comments First full day of exercise!  Patient was oriented to gym and equipment including functions, settings, policies, and procedures.  Patient's individual exercise prescription and treatment plan were reviewed.  All starting workloads were established based on the results of the 6 minute walk test done at initial orientation visit.  The plan for exercise progression was also introduced and progression will be customized based on patient's performance and goals.                Exercise Goals and Review:   Exercise Goals     Row Name 07/15/22 1022             Exercise Goals   Increase Physical Activity Yes       Intervention Develop an individualized exercise prescription for aerobic and resistive training based on initial evaluation findings, risk stratification, comorbidities and participant's personal goals.;Provide advice, education, support and counseling about physical activity/exercise needs.       Expected Outcomes Long Term: Exercising regularly at least 3-5 days a week.;Long Term: Add in home exercise to make exercise part of routine and to increase amount of physical activity.;Short Term: Attend rehab on a regular basis to increase amount of physical activity.       Increase Strength and Stamina Yes       Intervention Develop an individualized exercise  prescription for aerobic and resistive training based on initial evaluation findings, risk stratification, comorbidities and participant's personal goals.;Provide advice, education, support and counseling about physical activity/exercise needs.       Expected Outcomes Long Term: Improve cardiorespiratory fitness, muscular endurance and strength as measured by increased METs and functional capacity (6MWT);Short Term: Increase workloads from initial exercise prescription for resistance, speed, and METs.;Short Term: Perform resistance training exercises routinely during rehab and add in resistance training at home       Able to understand and use rate of perceived exertion (RPE) scale Yes       Intervention Provide education and explanation on how to use RPE scale       Expected Outcomes  Short Term: Able to use RPE daily in rehab to express subjective intensity level;Long Term:  Able to use RPE to guide intensity level when exercising independently       Able to understand and use Dyspnea scale Yes       Intervention Provide education and explanation on how to use Dyspnea scale       Expected Outcomes Long Term: Able to use Dyspnea scale to guide intensity level when exercising independently;Short Term: Able to use Dyspnea scale daily in rehab to express subjective sense of shortness of breath during exertion       Knowledge and understanding of Target Goodman Rate Range (THRR) Yes       Intervention Provide education and explanation of THRR including how the numbers were predicted and where they are located for reference       Expected Outcomes Short Term: Able to state/look up THRR;Long Term: Able to use THRR to govern intensity when exercising independently;Short Term: Able to use daily as guideline for intensity in rehab       Able to check pulse independently Yes       Intervention Provide education and demonstration on how to check pulse in carotid and radial arteries.;Review the importance of being  able to check your own pulse for safety during independent exercise       Expected Outcomes Short Term: Able to explain why pulse checking is important during independent exercise;Long Term: Able to check pulse independently and accurately       Understanding of Exercise Prescription Yes       Intervention Provide education, explanation, and written materials on patient's individual exercise prescription       Expected Outcomes Short Term: Able to explain program exercise prescription;Long Term: Able to explain home exercise prescription to exercise independently                Exercise Goals Re-Evaluation :  Exercise Goals Re-Evaluation     Row Name 07/21/22 7564 08/04/22 1554 08/05/22 1047 08/19/22 1526 09/01/22 1039     Exercise Goal Re-Evaluation   Exercise Goals Review Able to understand and use rate of perceived exertion (RPE) scale;Able to understand and use Dyspnea scale;Knowledge and understanding of Target Goodman Rate Range (THRR);Understanding of Exercise Prescription Increase Physical Activity;Increase Strength and Stamina;Understanding of Exercise Prescription Increase Physical Activity;Increase Strength and Stamina;Understanding of Exercise Prescription Increase Physical Activity;Increase Strength and Stamina;Understanding of Exercise Prescription Increase Physical Activity;Increase Strength and Stamina;Understanding of Exercise Prescription   Comments Reviewed RPE scale, THR and program prescription with pt today.  Pt voiced understanding and was given a copy of goals to take home. [name] Goodman is doing well for the first couple of sessions he has been here.  He has already increased to level 4 on the T4 Nustep and added in a 0.5% incline on the treadmill. We will encourage patient to increase to level 1.5 on the REL as that was his starting initial exercise load. He is hitting his THR all session so far. We will continue to monitor. [name] Goodman is off to a good start in rehab.  He is already  walking some on his off days and feeling good.  We will review home exercise guidelines with him soon. [name] Goodman continues to do well in rehab. He recently increased his overall average MET level to 3.04 METs. He also was able to increase his workload n the treadmill to a speed of 2.5 mph and an incline of 1%. He improved to level 3  on the XR as well. We will continue to monitor his progress in the program. Reviewed home exercise with pt today.  Pt plans to walk for exercise.  Patient walks for 2 miles every day. If the weather does not permit, he will walk indoors in the mall. He was encouraged to incorporate more weight training into his exercise routine. He has 10 lb handweights at his home. Reviewed THR, pulse, RPE, sign and symptoms, pulse oximetery and when to call 911 or MD.  Also discussed weather considerations and indoor options.  Pt voiced understanding.   Expected Outcomes Short: Use RPE daily to regulate intensity.  Long: Follow program prescription in THR. Short: Increase REL to 1.5 level Long: Continue to increase overall MET level Short: Continue to attend rehab regularly Long: Conitnue to improve stamina Short: Continue to increase workloads as tolerated. Long: Conitnue to increase strength and stamina. Short: Check HR during walking Long: Continue to exercise independently at home    Row Name 09/02/22 1504 09/17/22 1108 09/30/22 1344 10/01/22 0958       Exercise Goal Re-Evaluation   Exercise Goals Review Increase Physical Activity;Increase Strength and Stamina;Understanding of Exercise Prescription Increase Physical Activity;Increase Strength and Stamina;Understanding of Exercise Prescription Increase Physical Activity;Increase Strength and Stamina;Understanding of Exercise Prescription Increase Physical Activity;Increase Strength and Stamina;Understanding of Exercise Prescription    Comments [name] Goodman has been a little sporadic with his exercise sessions as [place]. [place] week and then is out for the holidays. Reiterated attendance and hope to see a more Goodman schedule after the holidays. With 1 session to review, patient did increase to a 1.5% incline on the treadmill and almost worked tp 4 METS on the T4 Nustep. Will continue to monitor. [name] Goodman is doing well in rehab. He has continued to consistently work at a MET level above 3 METs. He also has been consistent with his workload on the treadmill at a speed of 2.5 mph and a 1% incline. He also has continued to tolerate level 1 on the REL, but he could benefit from increasing the level. We will continue to monitor his progress in the program. Patient had been out of rehab for several weeks as he has been traveling. Yesterday was his first day back after being out for some time. He took it lighter and eased back into exercise. He did try the recumbent bike that he hasn't worked with in a while and was able to work up to 18 watts. We will continue to follow patient and encourage consistent attendance. [name] Goodman is doing well in rehab. He is walking 2-3 miles each day.  When the weather is bad, he will go to the mall to walk.  He has not noticed too much difference in his stamina as it never really changed too much.    Expected Outcomes Short: Maintain good attendance Long: Graduate from the [employer] program Short: Try increasing workload on the REL. Long: Continue to improve strength and stamina. Short: Maintain consistent exercise and get back into the routine Long: Continue to increase overall MET level short; conitnue to exercise on off days Long: Continue to improve stamina             Discharge Exercise Prescription (Final Exercise Prescription Changes):  Exercise Prescription Changes - 09/30/22 1300       Response to Exercise   Blood Pressure (Admit) 122/62    Blood Pressure (Exercise) 128/64    Blood  Pressure (Exit) 112/60    Goodman Rate (Admit) 66 bpm    Goodman Rate (Exercise)  91 bpm    Goodman Rate (Exit) 65 bpm    Rating of Perceived Exertion (Exercise) 12    Symptoms none    Duration Continue with 30 min of aerobic exercise without signs/symptoms of physical distress.    Intensity THRR unchanged      Progression   Progression Continue to progress workloads to maintain intensity without signs/symptoms of physical distress.    Average METs 2.77      Resistance Training   Training Prescription Yes    Weight 4 lb    Reps 10-15      Interval Training   Interval Training No      Treadmill   MPH 2.5    Grade 0    Minutes 15    METs 2.91      Recumbant Bike   Level 2    Watts 18    Minutes 15    METs 2.63      Home Exercise Plan   Plans to continue exercise at Home (comment)   walking outdoors; walking at mall for indoors   Frequency Add 2 additional days to program exercise sessions.    Initial Home Exercises Provided 09/01/22      Oxygen   Maintain Oxygen Saturation 88% or higher             Nutrition:  Target Goals: Understanding of nutrition guidelines, daily intake of sodium 1500mg , cholesterol 200mg , calories 30% from fat and 7% or less from saturated fats, daily to have 5 or more servings of fruits and vegetables.  Education: All About Nutrition: -Group instruction provided by verbal, written material, interactive activities, discussions, models, and posters to present general guidelines for Goodman healthy nutrition including fat, fiber, MyPlate, the role of sodium in Goodman healthy nutrition, utilization of the nutrition label, and utilization of this knowledge for meal planning. Follow up email sent as well. Written material given at graduation. Flowsheet Row [facility]  Education need identified 07/15/22       Biometrics:  Pre Biometrics - 07/15/22 1023       Pre Biometrics   Height 6' 1.5" (1.867 m)    Weight 201 lb 12.8 oz (91.5 kg)    Waist Circumference 39 inches     Hip Circumference 41.5 inches    Waist to Hip Ratio 0.94 %    BMI (Calculated) 26.26    Single Leg Stand 7.1 seconds   L             Nutrition Therapy Plan and Nutrition Goals:  Nutrition Therapy & Goals - 07/15/22 0926       Nutrition Therapy   Diet Goodman healthy, low Na    Drug/Food Interactions Statins/Certain Fruits    Protein (specify units) 90-100g    Fiber 28 grams    Whole Grain Foods 3 servings    Saturated Fats 16 max. grams    Fruits and Vegetables 8 servings/day   8 ideal, but he eats smaller amounts of food during the day.   Sodium 1.5 grams      Personal Nutrition Goals   Nutrition Goal ST: consistenly eat at least 1 serving of non-starcy vegetables at dinner time, continue to read food labels for sodium, include at least 1-2 additional sources of protein for the day (easier examples inlcudes beans/lentils, peanut butter, greek yogurt, boiled eggs,  or nutritional shake like boost or ensure) LT: prevent fluid retention by keeping Na <1.5-2g/day, meet protein and calorie needs, include at least 1 fruit/ non-starchy vegetable each meal    Comments 82 y.o. M admitted to cardiac rehab s/p CABG x5. PMHx includes HTN, HLD, arthritis, BPH, nephrolithiasis, CHF, CAD, TKR. Relevant medications includes lipitor, vit B12, jardiance, furosemide, MVI with minerals, tramadol. BMP 07/14/22 showed GFR 61mL/min, serum creatinine 1.69 mg/dL, and BUN 26 mg/dL.[name] Goodman reports no longer using the salt shaker or using many canned foods. [name] Goodman reports his eating patterns vary. B: rasin bran with toast and coffee or biscuit from [employer] L: nothing usually but will sometimes eat jello or fruit. Sometimes he will eat 1/2 sandwich. D: he reports cooking most meals: steak, hamburger (96% lean), pork chops, fish, chicken with vegetables (including mixed vegetables, salads, and broccoli) and potatoes. He feels he should eat more vegetables as he doesn't always like cooking for one person - suggested  frozen vegetables as an option. He also reports liking pasta and he looks at the sodium for the sauce. S:jello with fruit or cups of fruit. Drinks: coffee in am and water as well as cranberry juice (a couple of glasses). He also likes to snack on unsalted nuts during the day. 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 He reports his strength is about the same, but his energy has been lower - he walks 2 miles every morning and now will have to sit down and rest afterwards, previously he did not have to. He reports his significant other is invested in his health, but she was not able to come today. Discussed Goodman healthy and modest changes to diet including increasing protein as his dinner is his main source during the day. Encouraged easier ways to include protein such as beans/lentils, peanut butter, greek yogurt, boiled eggs, or nutritional shake like boost or ensure as he eats smaller amounts of food.      Intervention Plan   Intervention Nutrition handout(s) given to patient.;Prescribe, educate and counsel regarding individualized specific dietary modifications aiming towards targeted core components such as weight, hypertension, lipid management, diabetes, Goodman failure and other comorbidities.    Expected Outcomes Short Term Goal: Understand basic principles of dietary content, such as calories, fat, sodium, cholesterol and nutrients.;Short Term Goal: A plan has been developed with personal nutrition goals set during dietitian appointment.;Long Term Goal: Adherence to prescribed nutrition plan.             Nutrition Assessments:  MEDIFICTS Score Key: ?70 Need to make dietary changes  40-70 Goodman Healthy Diet ? 40 Therapeutic Level Cholesterol Diet  Flowsheet Row Cardiac Rehab from 07/15/2022 in [facility]iac [facility]  Picture Your  Plate Total Score on Admission 68      Picture Your Plate Scores: <16 Unhealthy dietary pattern with much room for improvement. 41-50 Dietary pattern unlikely to meet recommendations for good health and room for improvement. 51-60 More healthful dietary pattern, with some room for improvement.  >60 Healthy dietary pattern, although there may be some specific behaviors that could be improved.    Nutrition Goals Re-Evaluation:  Nutrition Goals Re-Evaluation     Row Name 08/05/22 1049 09/01/22 1053 10/01/22 1002         Goals   Nutrition Goal  ST: consistenly eat at least 1 serving of non-starcy vegetables at dinner time, continue to read food labels for sodium, include at least 1-2 additional sources of protein for the day (easier examples inlcudes beans/lentils, peanut butter, greek yogurt, boiled eggs, or nutritional shake like boost or ensure) LT: prevent fluid retention by keeping Na <1.5-2g/day, meet protein and calorie needs, include at least 1 fruit/ non-starchy vegetable each meal ST: consistenly eat at least 1 serving of non-starcy vegetables at dinner time, continue to read food labels for sodium, include at least 1-2 additional sources of protein for the day (easier examples inlcudes beans/lentils, peanut butter, greek yogurt, boiled eggs, or nutritional shake like boost or ensure) LT: prevent fluid retention by keeping Na <1.5-2g/day, meet protein and calorie needs, include at least 1 fruit/ non-starchy vegetable each meal Short: continue to watch labels for sodium levels Long: Continue to eat Goodman healthy diet     Comment [name] StableBill is doing well about watching his salt intake.  He is trying to eat more salads to increase fiber and non starchy vegetables.  He is a big fan of pinto beans and cabbage.  He is getting more protein and trying some alternatives as well. [name] StableBill states the biggest change he is primarily focused on right now is reducing his salt intake. 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 Has not really focused on the intake of non-starchy begetables but has been eating more beans and nuts for protein but is not sure how much of protein he is intaking beyond that. Encouraged to incorporate the examples that the RD recommended. He travels a lot to see his partner and tried to make him aware of protein sources he can take with him on the go (shakes, nuts, etc). [name] StableBill continues to work on his diet.  He is working on cutting back salt, but has been eating a lot of salt today.  He is trying to get in enough protein to fuel and build muscle.  He is not where he should be with his variety of fruits and vegetable as he really only eating bananas and oranges.     Expected Outcome Short: Conitnue to limit salt intake Long: Continue to add in more protein Short: continue to watch labels for sodium levels Long: Continue to eat Goodman healthy diet Short: continue to work on reducing sodium Long: conitnue to follow Goodman healthy diet              Nutrition Goals Discharge (Final Nutrition Goals Re-Evaluation):  Nutrition Goals Re-Evaluation - 10/01/22 1002       Goals   Nutrition Goal Short: continue to watch labels for sodium levels Long: Continue to eat Goodman healthy diet    Comment [name] StableBill continues to work on his diet.  He is working on cutting back salt, but has been eating a lot of salt today.  He is trying to get in enough protein to fuel and build muscle.  He is not where he should be with his variety of fruits and vegetable as he really only eating bananas and oranges.    Expected Outcome Short: continue to work on reducing sodium Long: conitnue to follow Goodman healthy diet             Psychosocial: Target Goals: Acknowledge presence or absence of significant depression and/or stress, maximize coping skills, provide positive support system. Participant is able to verbalize types  and ability to  use techniques and skills needed for reducing stress and depression.   Education: Stress, Anxiety, and Depression - Group verbal and visual presentation to define topics covered.  Reviews how body is impacted by stress, anxiety, and depression.  Also discusses healthy ways to reduce stress and to treat/manage anxiety and depression.  Written material given at graduation.   Education: Sleep Hygiene -Provides group verbal and written instruction about how sleep can affect your health.  Define sleep hygiene, discuss sleep cycles and impact of sleep habits. Review good sleep hygiene tips.    Initial Review & Psychosocial Screening:  Initial Psych Review & Screening - 07/09/22 1017       Family Dynamics   Good Support System? --   has 3 children.  2 live in area, one in [place]            Quality of Life Scores:   Quality of Life - 07/15/22 0953       Quality of Life   Select Quality of Life      Quality of Life Scores   Health/Function Pre 26 %    Socioeconomic Pre 30 %    Psych/Spiritual Pre 30 %    Family Pre 26.4 %    GLOBAL Pre 27.64 %            Scores of 19 and below usually indicate a poorer quality of life in these areas.  A difference of  2-3 points is a clinically meaningful difference.  A difference of 2-3 points in the total score of the Quality of Life Index has been associated with significant improvement in overall quality of life, self-image, physical symptoms, and general health in studies assessing change in quality of life.  PHQ-9: Review Flowsheet       07/15/2022  Depression screen PHQ 2/9  Decreased Interest 0  Down, Depressed, Hopeless 0  PHQ - 2 Score 0  Altered sleeping 0  Tired, decreased energy 1  Change in appetite 0  Feeling bad or failure about yourself  0  Trouble concentrating 0  Moving slowly or fidgety/restless 0  Suicidal thoughts 0  PHQ-9 Score 1  Difficult doing work/chores Not difficult at all   Interpretation of Total Score   Total Score Depression Severity:  1-4 = Minimal depression, 5-9 = Mild depression, 10-14 = Moderate depression, 15-19 = Moderately severe depression, 20-27 = Severe depression   Psychosocial Evaluation and Intervention:  Psychosocial Evaluation - 07/09/22 1018       Psychosocial Evaluation & Interventions   Interventions Encouraged to exercise with the program and follow exercise prescription    Comments [name] Goodman has no barriers to attending the program. He lives alone. He does have a significant other , Alice, that has been with his 29 years. He also has 3 children;2 live in the area and one lives in [place]. He also has many friends for support. He wants to get back on the golf course.    Expected Outcomes [name] Goodman attends all scheduled sessions, he is able to progress with his exercise beyond the 2 miles he is already walking. He will return to playing golf. LTG [name] Goodman continues his exercise regimen and playing golf as he wishes    Continue Psychosocial Services  Follow up required by staff             Psychosocial Re-Evaluation:  Psychosocial Re-Evaluation     [place] Name 08/05/22 1048 09/01/22 1042 10/01/22 1000  Psychosocial Re-Evaluation   Current issues with Current Stress Concerns Current Stress Concerns Current Stress Concerns     Comments [name] Goodman is doing well in rehab.  He is feeling good mentally.  His health is his biggest stressor, but he does his best to try not to let it get to him.  He is a good sleeper for most part. [name] Goodman continues to do well mentally. He is getting a cardioversion tomorrow and is anxious to see how it takes. After he is cleared, he plans to go to [place] for the mountains to visit his fiance who he has been engaged to for 30 years. She is a good support for him. He denies any other problems with sleep or mental health at this time. [name] Goodman is doing well in rehab.  He is feeling good mentally.  He has another electrophysiologist appt on Monday to see what is  going on.  He is hoping he is staying in rhythm.  He is sleeping well for most part.  Other wise he denies any other stressors.     Expected Outcomes Short: COnitnue to exercise for mental boost Long:Continue to stay positive Short: Enjoy time in [place] Long: Continue to maintain positive attitude Short: Talk to doctor about Goodman Long: continue to stay positive     Interventions Encouraged to attend Cardiac Rehabilitation for the exercise Encouraged to attend Cardiac Rehabilitation for the exercise Encouraged to attend Cardiac Rehabilitation for the exercise     Continue Psychosocial Services  Follow up required by staff Follow up required by staff Follow up required by staff              Psychosocial Discharge (Final Psychosocial Re-Evaluation):  Psychosocial Re-Evaluation - 10/01/22 1000       Psychosocial Re-Evaluation   Current issues with Current Stress Concerns    Comments [name] Goodman is doing well in rehab.  He is feeling good mentally.  He has another electrophysiologist appt on Monday to see what is going on.  He is hoping he is staying in rhythm.  He is sleeping well for most part.  Other wise he denies any other stressors.    Expected Outcomes Short: Talk to doctor about Goodman Long: continue to stay positive    Interventions Encouraged to attend Cardiac Rehabilitation for the exercise    Continue Psychosocial Services  Follow up required by staff             Vocational Rehabilitation: Provide vocational rehab assistance to qualifying candidates.   Vocational Rehab Evaluation & Intervention:  Vocational Rehab - 07/09/22 1024       Initial Vocational Rehab Evaluation & Intervention   Assessment shows need for Vocational Rehabilitation No      Vocational Rehab Re-Evaulation   Comments RETIRED             Education: Education Goals: Education classes will be provided on a variety of topics geared toward better understanding of Goodman health and risk factor  modification. Participant will state understanding/return demonstration of topics presented as noted by education test scores.  Learning Barriers/Preferences:  Learning Barriers/Preferences - 07/09/22 1012       Learning Barriers/Preferences   Learning Barriers None    Learning Preferences None             General Cardiac Education Topics:  AED/CPR: - Group verbal and written instruction with the use of models to demonstrate the basic use of the AED with the basic ABC's of resuscitation.   Anatomy and  Cardiac Procedures: - Group verbal and visual presentation and models provide information about basic cardiac anatomy and function. Reviews the testing methods done to diagnose Goodman disease and the outcomes of the test results. Describes the treatment choices: Medical Management, Angioplasty, or Coronary Bypass Surgery for treating various Goodman conditions including Myocardial Infarction, Angina, Valve Disease, and Cardiac Arrhythmias.  Written material given at graduation. Flowsheet Row Cardiac Rehab from 07/15/2022 in [facility] Cardiac and Pul[facility] Rehab  Education need identified 07/15/22       Medication Safety: - Group verbal and visual instruction to review commonly prescribed medications for Goodman and lung disease. Reviews the medication, class of the drug, and side effects. Includes the steps to properly store meds and maintain the prescription regimen.  Written material given at graduation.   Intimacy: - Group verbal instruction through game format to discuss how Goodman and lung disease can affect sexual intimacy. Written material given at graduation..   Know Your Numbers and Goodman Failure: - Group verbal and visual instruction to discuss disease risk factors for cardiac and pulmonary disease and treatment options.  Reviews associated critical values for Overweight/Obesity, Hypertension, Cholesterol, and Diabetes.  Discusses basics of Goodman failure: signs/symptoms and  treatments.  Introduces Goodman Failure Zone chart for action plan for Goodman failure.  Written material given at graduation.   Infection Prevention: - Provides verbal and written material to individual with discussion of infection control including proper hand washing and proper equipment cleaning during exercise session. Flowsheet Row Cardiac Rehab from 07/15/2022 in [facility] Cardiac and Pulmonary Rehab  Date 07/15/22  Educator NT  Instruction Review Code 1- Verbalizes Understanding       Falls Prevention: - Provides verbal and written material to individual with discussion of falls prevention and safety. Flowsheet Row Cardiac Rehab from 07/15/2022 in [facility] Cardiac and Pulmonary Rehab  Date 07/09/22  Educator SB  Instruction Review Code 1- Verbalizes Understanding       Other: -Provides group and verbal instruction on various topics (see comments)   Knowledge Questionnaire Score:  Knowledge Questionnaire Score - 07/15/22 0956       Knowledge Questionnaire Score   Pre Score 21/26             Core Components/Risk Factors/Patient Goals at Admission:  Personal Goals and Risk Factors at Admission - 07/15/22 0956       Core Components/Risk Factors/Patient Goals on Admission    Weight Management Yes    Intervention Weight Management: Develop a combined nutrition and exercise program designed to reach desired caloric intake, while maintaining appropriate intake of nutrient and fiber, sodium and fats, and appropriate energy expenditure required for the weight goal.;Weight Management/Obesity: Establish reasonable short term and long term weight goals.;Weight Management: Provide education and appropriate resources to help participant work on and attain dietary goals.    Admit Weight 201 lb 12.8 oz (91.5 kg)    Goal Weight: Short Term 195 lb (88.5 kg)    Goal Weight: Long Term 190 lb (86.2 kg)    Expected Outcomes Short Term: Continue to assess and modify interventions until short  term weight is achieved;Long Term: Adherence to nutrition and physical activity/exercise program aimed toward attainment of established weight goal;Weight Maintenance: Understanding of the daily nutrition guidelines, which includes 25-35% calories from fat, 7% or less cal from saturated fats, less than 200mg  cholesterol, less than 1.5gm of sodium, & 5 or more servings of fruits and vegetables daily    Goodman Failure Yes    Intervention Provide a  combined exercise and nutrition program that is supplemented with education, support and counseling about Goodman failure. Directed toward relieving symptoms such as shortness of breath, decreased exercise tolerance, and extremity edema.    Expected Outcomes Improve functional capacity of life;Short term: Attendance in program 2-3 days a week with increased exercise capacity. Reported lower sodium intake. Reported increased fruit and vegetable intake. Reports medication compliance.;Short term: Daily weights obtained and reported for increase. Utilizing diuretic protocols set by physician.;Long term: Adoption of self-care skills and reduction of barriers for early signs and symptoms recognition and intervention leading to self-care maintenance.    Hypertension Yes    Intervention Provide education on lifestyle modifcations including regular physical activity/exercise, weight management, moderate sodium restriction and increased consumption of fresh fruit, vegetables, and low fat dairy, alcohol moderation, and smoking cessation.;Monitor prescription use compliance.    Expected Outcomes Short Term: Continued assessment and intervention until BP is < 140/44mm HG in hypertensive participants. < 130/62mm HG in hypertensive participants with diabetes, Goodman failure or chronic kidney disease.;Long Term: Maintenance of blood pressure at goal levels.    Lipids Yes    Intervention Provide education and support for participant on nutrition & aerobic/resistive exercise along with  prescribed medications to achieve LDL 70mg , HDL >40mg .    Expected Outcomes Short Term: Participant states understanding of desired cholesterol values and is compliant with medications prescribed. Participant is following exercise prescription and nutrition guidelines.;Long Term: Cholesterol controlled with medications as prescribed, with individualized exercise RX and with personalized nutrition plan. Value goals: LDL < 70mg , HDL > 40 mg.             Education:Diabetes - Individual verbal and written instruction to review signs/symptoms of diabetes, desired ranges of glucose level fasting, after meals and with exercise. Acknowledge that pre and post exercise glucose checks will be done for 3 sessions at entry of program.   Core Components/Risk Factors/Patient Goals Review:   Goals and Risk Factor Review     Row Name 08/05/22 1052 09/01/22 1049 10/01/22 1004         Core Components/Risk Factors/Patient Goals Review   Personal Goals Review Weight Management/Obesity;Hypertension;Goodman Failure Weight Management/Obesity;Hypertension;Goodman Failure Weight Management/Obesity;Hypertension;Goodman Failure     Review 11/30/22 is doing well in rehab. His weight is holding steady and he checks it routinely.  He has not had any Goodman failure symptoms other than fatigue.  His pressures are doing well and he is checking it daily.  He is doing well on his meds overall. [name] Goodman states he has been checking his weight and has maintained between 192-196 lb. He knows to look for a sudden gain which could possibly be fluid. He denies Goodman failure symptoms and is diligent on his low salt intake. He is taking all of his medications as precribed. His  BP at home ranges 110-120/ 60s. Sometimes he gets low at rehab but drinks water and recovers well. He is always asymptomatic. He is getting a cardioversion tomorrow and will need clearance to return to rehab. Patient is aware. [name] Goodman is doing well in rehab.  His weight has been  steady for most part, but it was up some today.  He thinks it should go back down thourghout week.  His pressures are doing well overall.  He denies any Goodman failure symptoms.  His biggest complaint is soreness from cardioversion and dizziness with standing too quickly.  He has appt next week with phsyiologist about his Goodman.     Expected Outcomes Short: Continue to monitor  Goodman failure symptoms Long: Conitnue to monitor risk factors Short: Get through cardioversion, contacr rehab on his return date Long: Continue to manage lifestyle risk factors Short: Talk to doctor about Goodman at appt next Long: conitnue to monitor risk factors              Core Components/Risk Factors/Patient Goals at Discharge (Final Review):   Goals and Risk Factor Review - 10/01/22 1004       Core Components/Risk Factors/Patient Goals Review   Personal Goals Review Weight Management/Obesity;Hypertension;Goodman Failure    Review [name] Goodman is doing well in rehab.  His weight has been steady for most part, but it was up some today.  He thinks it should go back down thourghout week.  His pressures are doing well overall.  He denies any Goodman failure symptoms.  His biggest complaint is soreness from cardioversion and dizziness with standing too quickly.  He has appt next week with phsyiologist about his Goodman.    Expected Outcomes Short: Talk to doctor about Goodman at appt next Long: conitnue to monitor risk factors             ITP Comments:  ITP Comments     Row Name 07/09/22 1024 07/15/22 0946 07/21/22 0953 07/23/22 0954 08/20/22 0825   ITP Comments Virtual orientation call completed today. he has an appointment on Date: 07/15/2022  for EP eval and gym Orientation.  Documentation of diagnosis can be found in [facility] 05/18/2022 and 05/08/2022 . Completed 6MWT and gym orientation. Initial ITP created and sent for review to Dr. Emily Filbert, Medical Director. First full day of exercise!  Patient was oriented to gym and equipment  including functions, settings, policies, and procedures.  Patient's individual exercise prescription and treatment plan were reviewed.  All starting workloads were established based on the results of the 6 minute walk test done at initial orientation visit.  The plan for exercise progression was also introduced and progression will be customized based on patient's performance and goals. 30 Day review completed. Medical Director ITP review done, changes made as directed, and signed approval by Medical Director.    NEW TO PROGRAM 30 Day review completed. Medical Director ITP review done, changes made as directed, and signed approval by Medical Director.    [place] Name 09/17/22 1055 10/13/22 1004         ITP Comments 30 Day review completed. Medical Director ITP review done, changes made as directed, and signed approval by Medical Director. [name] Goodman decided to graduate the program early with 16/36 sessions. He stated it was a for a personal reason. Home exercise had been completed.               Comments: Discharge ITP

## 2022-10-30 ENCOUNTER — Telehealth (HOSPITAL_COMMUNITY): Payer: Self-pay

## 2022-10-30 NOTE — Telephone Encounter (Addendum)
Patient reports that he has been passing blood clots in this urine for the last 4-5 days but didn't have any this morning or yesterday.he reports that the clots are about the size of a finger nail Patient wants to know what he should do. Please advise.

## 2022-10-30 NOTE — Telephone Encounter (Signed)
He has afib and is scheduled for an upcomming afib ablation procedure and should continue on Eliquis to reduce risk of stroke. If his bleeding returns then he should follow-up w/ his PCP ASAP to check hgb and may need referral to a urologist.   I will also route this to Dr. Myles Gip for notification. If he continues to have issues w/ bleeding and unable to tolerate long term use of blood thinners, this may change treatment strategy for afib.

## 2022-10-30 NOTE — Telephone Encounter (Signed)
Patients wife advised in person. She reports the bleeding did not stop. I advised her that he needs to contact his pcp asap.

## 2022-11-03 DIAGNOSIS — I482 Chronic atrial fibrillation, unspecified: Secondary | ICD-10-CM | POA: Diagnosis not present

## 2022-11-03 DIAGNOSIS — Z7901 Long term (current) use of anticoagulants: Secondary | ICD-10-CM | POA: Diagnosis not present

## 2022-11-03 DIAGNOSIS — Z87442 Personal history of urinary calculi: Secondary | ICD-10-CM | POA: Diagnosis not present

## 2022-11-03 DIAGNOSIS — I5022 Chronic systolic (congestive) heart failure: Secondary | ICD-10-CM | POA: Diagnosis not present

## 2022-11-03 DIAGNOSIS — R7309 Other abnormal glucose: Secondary | ICD-10-CM | POA: Diagnosis not present

## 2022-11-03 DIAGNOSIS — R310 Gross hematuria: Secondary | ICD-10-CM | POA: Diagnosis not present

## 2022-11-17 DIAGNOSIS — I480 Paroxysmal atrial fibrillation: Secondary | ICD-10-CM | POA: Diagnosis not present

## 2022-11-17 DIAGNOSIS — E7800 Pure hypercholesterolemia, unspecified: Secondary | ICD-10-CM | POA: Diagnosis not present

## 2022-11-17 DIAGNOSIS — Z955 Presence of coronary angioplasty implant and graft: Secondary | ICD-10-CM | POA: Diagnosis not present

## 2022-11-17 DIAGNOSIS — I5022 Chronic systolic (congestive) heart failure: Secondary | ICD-10-CM | POA: Diagnosis not present

## 2022-11-17 DIAGNOSIS — R001 Bradycardia, unspecified: Secondary | ICD-10-CM | POA: Diagnosis not present

## 2022-11-17 DIAGNOSIS — Z951 Presence of aortocoronary bypass graft: Secondary | ICD-10-CM | POA: Diagnosis not present

## 2022-11-17 DIAGNOSIS — I10 Essential (primary) hypertension: Secondary | ICD-10-CM | POA: Diagnosis not present

## 2022-11-17 DIAGNOSIS — Z23 Encounter for immunization: Secondary | ICD-10-CM | POA: Diagnosis not present

## 2022-11-18 DIAGNOSIS — E875 Hyperkalemia: Secondary | ICD-10-CM | POA: Diagnosis not present

## 2022-11-26 ENCOUNTER — Ambulatory Visit: Payer: PPO | Admitting: Urology

## 2022-11-28 ENCOUNTER — Ambulatory Visit (HOSPITAL_COMMUNITY)
Admission: RE | Admit: 2022-11-28 | Discharge: 2022-11-28 | Disposition: A | Discharge status: Home / Self Care | Payer: PPO | Source: Ambulatory Visit | Attending: Internal Medicine | Admitting: Internal Medicine

## 2022-11-28 ENCOUNTER — Encounter (HOSPITAL_COMMUNITY): Payer: Self-pay | Admitting: Internal Medicine

## 2022-11-28 VITALS — BP 130/80 | HR 65 | Wt 200.2 lb

## 2022-11-28 DIAGNOSIS — Z7902 Long term (current) use of antithrombotics/antiplatelets: Secondary | ICD-10-CM | POA: Insufficient documentation

## 2022-11-28 DIAGNOSIS — Z951 Presence of aortocoronary bypass graft: Secondary | ICD-10-CM | POA: Diagnosis not present

## 2022-11-28 DIAGNOSIS — I5022 Chronic systolic (congestive) heart failure: Secondary | ICD-10-CM | POA: Diagnosis not present

## 2022-11-28 DIAGNOSIS — Z7901 Long term (current) use of anticoagulants: Secondary | ICD-10-CM | POA: Insufficient documentation

## 2022-11-28 DIAGNOSIS — I482 Chronic atrial fibrillation, unspecified: Secondary | ICD-10-CM

## 2022-11-28 DIAGNOSIS — I4819 Other persistent atrial fibrillation: Secondary | ICD-10-CM | POA: Diagnosis not present

## 2022-11-28 DIAGNOSIS — I4510 Unspecified right bundle-branch block: Secondary | ICD-10-CM | POA: Diagnosis not present

## 2022-11-28 DIAGNOSIS — Z7984 Long term (current) use of oral hypoglycemic drugs: Secondary | ICD-10-CM | POA: Diagnosis not present

## 2022-11-28 DIAGNOSIS — I340 Nonrheumatic mitral (valve) insufficiency: Secondary | ICD-10-CM

## 2022-11-28 DIAGNOSIS — Z955 Presence of coronary angioplasty implant and graft: Secondary | ICD-10-CM | POA: Diagnosis not present

## 2022-11-28 DIAGNOSIS — I130 Hypertensive heart and chronic kidney disease with heart failure and stage 1 through stage 4 chronic kidney disease, or unspecified chronic kidney disease: Secondary | ICD-10-CM | POA: Insufficient documentation

## 2022-11-28 DIAGNOSIS — I2510 Atherosclerotic heart disease of native coronary artery without angina pectoris: Secondary | ICD-10-CM | POA: Diagnosis not present

## 2022-11-28 DIAGNOSIS — Z86711 Personal history of pulmonary embolism: Secondary | ICD-10-CM | POA: Insufficient documentation

## 2022-11-28 DIAGNOSIS — Z79899 Other long term (current) drug therapy: Secondary | ICD-10-CM | POA: Diagnosis not present

## 2022-11-28 DIAGNOSIS — N179 Acute kidney failure, unspecified: Secondary | ICD-10-CM | POA: Diagnosis not present

## 2022-11-28 DIAGNOSIS — E875 Hyperkalemia: Secondary | ICD-10-CM | POA: Insufficient documentation

## 2022-11-28 DIAGNOSIS — I493 Ventricular premature depolarization: Secondary | ICD-10-CM | POA: Diagnosis not present

## 2022-11-28 DIAGNOSIS — N1831 Chronic kidney disease, stage 3a: Secondary | ICD-10-CM | POA: Insufficient documentation

## 2022-11-28 DIAGNOSIS — E785 Hyperlipidemia, unspecified: Secondary | ICD-10-CM | POA: Insufficient documentation

## 2022-11-28 DIAGNOSIS — Z87891 Personal history of nicotine dependence: Secondary | ICD-10-CM | POA: Diagnosis not present

## 2022-11-28 LAB — CBC
HCT: 49.2 % (ref 39.0–52.0)
Hemoglobin: 16 g/dL (ref 13.0–17.0)
MCH: 30.1 pg (ref 26.0–34.0)
MCHC: 32.5 g/dL (ref 30.0–36.0)
MCV: 92.7 fL (ref 80.0–100.0)
Platelets: 173 10*3/uL (ref 150–400)
RBC: 5.31 MIL/uL (ref 4.22–5.81)
RDW: 17.6 % — ABNORMAL HIGH (ref 11.5–15.5)
WBC: 5.6 10*3/uL (ref 4.0–10.5)
nRBC: 0 % (ref 0.0–0.2)

## 2022-11-28 LAB — COMPREHENSIVE METABOLIC PANEL
ALT: 24 U/L (ref 0–44)
AST: 20 U/L (ref 15–41)
Albumin: 4.5 g/dL (ref 3.5–5.0)
Alkaline Phosphatase: 63 U/L (ref 38–126)
Anion gap: 8 (ref 5–15)
BUN: 37 mg/dL — ABNORMAL HIGH (ref 8–23)
CO2: 27 mmol/L (ref 22–32)
Calcium: 9.4 mg/dL (ref 8.9–10.3)
Chloride: 104 mmol/L (ref 98–111)
Creatinine, Ser: 2.28 mg/dL — ABNORMAL HIGH (ref 0.61–1.24)
GFR, Estimated: 28 mL/min — ABNORMAL LOW (ref >60)
Glucose, Bld: 132 mg/dL — ABNORMAL HIGH (ref 70–99)
Potassium: 5.3 mmol/L — ABNORMAL HIGH (ref 3.5–5.1)
Sodium: 139 mmol/L (ref 135–145)
Total Bilirubin: 1.2 mg/dL (ref 0.3–1.2)
Total Protein: 7.5 g/dL (ref 6.5–8.1)

## 2022-11-28 LAB — TSH: TSH: 2.587 u[IU]/mL (ref 0.350–4.500)

## 2022-11-28 LAB — T4, FREE: Free T4: 0.86 ng/dL (ref 0.61–1.12)

## 2022-11-28 MED ORDER — METOPROLOL SUCCINATE ER 50 MG PO TB24: 50.0000 mg | ORAL_TABLET | Freq: Every day | ORAL | 11 refills | Status: DC

## 2022-11-28 NOTE — Progress Notes (Signed)
ADVANCED HF CLINIC NOTE   Primary Care: Maryland Pink, MD Primary Cardiologist: Dr. Saralyn Pilar HF Cardiologist: Dr. Haroldine Laws  HPI: [name] Goodman is a 82 y.o.male with history of CAD s/p CABG 08/23, HFmEF, atrial fibrillation, HTN, HLD.   Patient underwent CABG X 5 on 05/08/22. Intra-op TEE with LVEF 40-45%. Had postoperative atrial fibrillation and developed bradycardia on amiodarone drip. He was started on cardizem and Eliquis and converted to SR prior to discharge.    He was readmitted on 05/18/22 with segmental and subsegmental PE right middle and upper lobes, CHF, left pleural effusion s/p thoracentesis and recurrent atrial fibrillation. Echo 05/19/22: EF 25-30%, paradoxical septal motion consistent with postop status, mildly reduced RV. He was diuresed with IV lasix and GDMT titrated. Cardiazem stopped d/t reduced EF and transitioned to metoprolol. Converted to SR during admit. PVCs also noted on tele. Had early graft failure with occlusion of SVG to Diagonal and SVG to RCA occluded. LIMA to LAD and radial jump graft to OM and RPLV patent. Underwent successful staged PCI/DES to distal RCA and PCI/DES p Cx into OM1.   Seen in TOC, NYHA II and volume stable. Remained in NSR and spiro 12.5 added and referred to AHF.  Zio 9/23 showed AF/AFL burden 84% with PVC burden of 18.6%. Started on amiodarone.  Follow up 10/23, stable NYHA II-early III and volume OK. Losartan stopped, Entresto 24/26 started, spiro 12.5 restarted and lasix decreased to 20 mg daily. Lasix later increased to 40 mg daily at general cardiology follow up due to increase dyspnea.   Zio 11/23 Continuous AFL (avg 76 bpm) PVC 10.7% (2 morphologies 6.0% & 4.7%)   Echo 08/27/22 EF 25-30% moderate to severe MR. Personally reviewed  Had DC-CV 09/02/22   Today He returns for HF follow up. Overall feeling fine. Recently seen by PCP for hematuria which last about a week and resolved. Has f/u Urology with next week. Recent labs  show AKI and hyperkalemia with K 5.5 and Scr 2.0 (up from 1.4). Denies CP or SOB. Walking 2-3 miles per day. No edema, orthopnea or PND. Taking amio 200 daily. Taking lasix 20 daily. Gets dizzy when standing  Cardiac Studies  - Zio 2 week (9/23): mostly AF/AFL (84%) burden, PVCs (18.6% burden), 589 runs of NSVT  - Echo (05/18/22): EF 25-30%, paradoxical septal motion consistent with postop status, mildly reduced RV.   - Intra-op TEE (05/08/22): EF 40-45%  Past Medical History:  Diagnosis Date   Arthritis    History of kidney stones    Hyperlipidemia    Hypertension    Nocturia    S/P CABG (coronary artery bypass graft)    Current Outpatient Medications  Medication Sig Dispense Refill   amiodarone (PACERONE) 200 MG tablet Take 200 mg by mouth daily.     apixaban (ELIQUIS) 5 MG TABS tablet Take 1 tablet (5 mg total) by mouth 2 (two) times daily. 60 tablet 6   atorvastatin (LIPITOR) 80 MG tablet Take 1 tablet (80 mg total) by mouth daily. 30 tablet 7   clopidogrel (PLAVIX) 75 MG tablet Take 1 tablet (75 mg total) by mouth daily. 30 tablet 7   empagliflozin (JARDIANCE) 10 MG TABS tablet Take 1 tablet (10 mg total) by mouth daily. 30 tablet 7   furosemide (LASIX) 20 MG tablet Take 20 mg by mouth daily.     isosorbide mononitrate (IMDUR) 30 MG 24 hr tablet Take 1 tablet (30 mg total) by mouth daily. 30 tablet 6   sacubitril-valsartan (  ENTRESTO) 24-26 MG Take 1 tablet by mouth 2 (two) times daily. 60 tablet 11   spironolactone (ALDACTONE) 25 MG tablet Take 0.5 tablets (12.5 mg total) by mouth daily. 15 tablet 7   cyanocobalamin (VITAMIN B12) 1000 MCG tablet Take 1,000 mcg by mouth daily. (Patient not taking: Reported on 11/28/2022)     metoprolol succinate (TOPROL-XL) 100 MG 24 hr tablet Take 1 tablet (100 mg total) by mouth daily. Take with or immediately following a meal. (Patient not taking: Reported on 11/28/2022) 30 tablet 7   No current facility-administered medications for this encounter.    No Known Allergies  Social History   Socioeconomic History   Marital status: Divorced    Spouse name: Not on file   Number of children: 3   Years of education: Not on file   Highest education level: Master's degree (e.g., MA, MS, MEng, MEd, MSW, MBA)  Occupational History   Occupation: retired Leisure centre manager at [employer] Use   Smoking status: Former    Packs/day: 1.00    Years: 15.00    Total pack years: 15.00    Types: Cigarettes    Quit date: 07/17/1975    Years since quitting: 47.4   Smokeless tobacco: Never  Vaping Use   Vaping Use: Never used  Substance and Sexual Activity   Alcohol use: No   Drug use: No   Sexual activity: Not on file  Other Topics Concern   Not on file  Social History Narrative   Lives alone   Social Determinants of Health   Financial Resource Strain: Low Risk  (05/20/2022)   Overall Financial Resource Strain (CARDIA)    Difficulty of Paying Living Expenses: Not hard at all  Food Insecurity: No Food Insecurity (05/20/2022)   Hunger Vital Sign    Worried About Running Out of Food in the Last Year: Never true    [place] in the Last Year: Never true  Transportation Needs: No Transportation Needs (05/20/2022)   PRAPARE - Hydrologist (Medical): No    Lack of Transportation (Non-Medical): No  Physical Activity: Not on file  Stress: Not on file  Social Connections: Not on file  Intimate Partner Violence: Not on file   Family History  Problem Relation Age of Onset   Hypertension Father    Cancer Father    BP 130/80   Pulse 65   Wt 90.8 kg (200 lb 3.2 oz)   SpO2 96%   BMI 26.41 kg/m   Wt Readings from Last 3 Encounters:  11/28/22 90.8 kg (200 lb 3.2 oz)  10/06/22 91.1 kg (200 lb 12.8 oz)  08/27/22 89.5 kg (197 lb 6.4 oz)   PHYSICAL EXAM: General:  Well appearing. No resp difficulty HEENT: normal Neck: supple. no JVD. Carotids 2+ bilat; no bruits. No lymphadenopathy or thryomegaly appreciated. Cor:  PMI nondisplaced. Regular rate & rhythm. No rubs, gallops or murmurs. Lungs: clear Abdomen: soft, nontender, nondistended. No hepatosplenomegaly. No bruits or masses. Good bowel sounds. Extremities: no cyanosis, clubbing, rash, edema Neuro: alert & orientedx3, cranial nerves grossly intact. moves all 4 extremities w/o difficulty. Affect pleasant  ECG: NSR 70 Personally reviewed   ASSESSMENT & PLAN: Chronic systolic CHF - pre-CABG LV gram (7/23): EF 35-40%  - Intraop TEE 05/08/22: EF 40-45% - Limited echo 05/19/22: EF 25-30%, RV mildly reduced - -S/p PCI/DES to RCA and PCI/DES to p Lcx into OM1 9/23 (see below).  - Echo 08/27/22 EF 25-30% moderate  to severe MR. Personally reviewed - suspect primarily iCM. Had early graft failure post CABG. vs PVC  - Doing well. NYHA II. Volume looks ok on exam. Perhaps a bit low. - Continue Entresto 24/26 mg bid.  - Stop spiro due to AKI and hyperkalemia - Continue Jardiance 10 mg daily. - Decrease Toprol XL to 50 mg daily. - Change lasix to prn on - Labs today. - Pending CMRI next week to re-assess EF and determine scar burden  2. CAD - S/p CABG X 5 (LIMA to LAD, SVG to PDA, SVG to D1, sequential left radial artery to OM and PL) 05/08/22 - Early graft failure with occlusion of SVG to D1 and SVG to RCA. LIMA to LAD and radial graft to OM1 and RPLV patent - S/p PCI/DES to RCA and PCI/DES p Lcx into OM1 05/27/22. - No s/s angina - Continue Plavix and Eliquis. - Continue statin  3. Persistent atrial fibrillation/AFL  - Noted post CABG. - Had recurrence at time of recent admission. Converted to SR spontaneously.  - Zio 2 week (9/23) showed mostly AF/AFL 84% burden, frequent PVCs (18.6%) - Zio 11/23 100% AFL  - s/p DC-CV 12/23 - Has seen Dr. Myles Gip and discussed possible ablation in 4/24 - Continue amiodarone 200 mg daily. - In NSR today - Continue Eliquis 5 mg bid. - Check labs today  4. Frequent PVCs - Zio 2 week (9/23): PVCs 18.6% -  Previously discussed sleep study, he was not interested - Repeat Zio 2 11/23 10.7% PVCs - remains on amio - If EF not improving will need Zio at next visit to reassess PVC burden.   5. HTN - Blood pressure well controlled. Continue current regimen.   6. CKD IIIa - Baseline Scr 1.3-1.5 - Scr up recently.  - Stop spiro and make lasix prn.  - Labs today   7. Recent PE - Post CABG - Continue Eliquis 5 mg bid.   8. Mitral regurgitation - moderate to severe on previosus echo - likely ischemic (vs functional) - check cMRI   Total time spent 40 minutes. Over half that time spent discussing above.     Glori Bickers, MD  3:40 PM

## 2022-11-28 NOTE — Patient Instructions (Addendum)
STOP Spironolactone and Imdur  DECREASE Toprol to 50 mg daily.  CHANGE Lasix to as needed only for weight gain of 3lb  in 24 hours 5lbs in a week, shortness of breath or edema.  Your physician recommends that you schedule a follow-up appointment in: 2 months at the [facility] office  If you have any questions or concerns before your next appointment please send [place] a message through [place] or call our office at [phone].    TO LEAVE A MESSAGE FOR THE NURSE SELECT OPTION 2, PLEASE LEAVE A MESSAGE INCLUDING: YOUR NAME DATE OF BIRTH CALL BACK NUMBER REASON FOR CALL**this is important as we prioritize the call backs  YOU WILL RECEIVE A CALL BACK THE SAME DAY AS LONG AS YOU CALL BEFORE 4:00 PM  At the [facility], you and your health needs are our priority. As part of our continuing mission to provide you with exceptional heart care, we have created designated Provider Care Teams. These Care Teams include your primary Cardiologist (physician) and Advanced Practice Providers (APPs- Physician Assistants and Nurse Practitioners) who all work together to provide you with the care you need, when you need it.   You may see any of the following providers on your designated Care Team at your next follow up: Dr Glori Bickers Dr Loralie Champagne Dr. Roxana Hires, NP Lyda Jester, [facility] [place], [place] Forestine Na, NP Audry Riles, PharmD   Please be sure to bring in all your medications bottles to every appointment.    Thank you for choosing [facility]

## 2022-11-30 LAB — T3, FREE: T3, Free: 2.6 pg/mL (ref 2.0–4.4)

## 2022-12-02 ENCOUNTER — Telehealth (HOSPITAL_COMMUNITY): Payer: Self-pay | Admitting: *Deleted

## 2022-12-02 ENCOUNTER — Other Ambulatory Visit: Payer: Self-pay | Admitting: *Deleted

## 2022-12-02 DIAGNOSIS — N200 Calculus of kidney: Secondary | ICD-10-CM

## 2022-12-02 NOTE — Telephone Encounter (Signed)
Reaching out to patient to offer assistance regarding upcoming cardiac imaging study; pt verbalizes understanding of appt date/time, parking situation and where to check in, and verified current allergies; name and call back number provided for further questions should they arise  Nyair Depaulo RN Navigator Cardiac Imaging [facility] [phone] office [phone] cell  Patient reports having an MRI in the past without incident. 

## 2022-12-03 ENCOUNTER — Ambulatory Visit: Payer: PPO | Admitting: Urology

## 2022-12-03 ENCOUNTER — Other Ambulatory Visit (HOSPITAL_COMMUNITY): Payer: Self-pay | Admitting: Internal Medicine

## 2022-12-03 ENCOUNTER — Ambulatory Visit (HOSPITAL_COMMUNITY)
Admission: RE | Admit: 2022-12-03 | Discharge: 2022-12-03 | Disposition: A | Discharge status: Home / Self Care | Payer: PPO | Source: Ambulatory Visit | Attending: Internal Medicine | Admitting: Internal Medicine

## 2022-12-03 ENCOUNTER — Telehealth: Payer: Self-pay | Admitting: Cardiovascular Disease

## 2022-12-03 DIAGNOSIS — I5022 Chronic systolic (congestive) heart failure: Secondary | ICD-10-CM

## 2022-12-03 MED ORDER — GADOBUTROL 1 MMOL/ML IV SOLN
10.0000 mL | Freq: Once | INTRAVENOUS | Status: AC | PRN
  Administered 2022-12-03: 10 mL via INTRAVENOUS

## 2022-12-03 NOTE — Telephone Encounter (Signed)
Called and spoke with patient.  Patient states he has been in sinus rhythm with no reoccurrence of a-fib. Patient states he has discussed this with Dr. Haroldine Laws and his cardiologist in [place], and all feel that he does not need to proceed with ablation at this time.  Patient would like to cancel his ablation procedure scheduled for 01/12/23. Advised patient to contact our office if any new symptoms or reoccurrence of a-fib. Patient verbalized understanding and expressed appreciation for follow-up.

## 2022-12-03 NOTE — Telephone Encounter (Signed)
Pt states his afib is gone away and he met with his cardiologist. Pt wants to cancel his ablation.

## 2022-12-04 ENCOUNTER — Encounter: Payer: Self-pay | Admitting: Urology

## 2022-12-04 ENCOUNTER — Ambulatory Visit
Admission: RE | Admit: 2022-12-04 | Discharge: 2022-12-04 | Disposition: A | Discharge status: Home / Self Care | Payer: PPO | Source: Ambulatory Visit | Attending: Urology | Admitting: Urology

## 2022-12-04 ENCOUNTER — Ambulatory Visit: Payer: PPO | Admitting: Urology

## 2022-12-04 ENCOUNTER — Ambulatory Visit
Admission: RE | Admit: 2022-12-04 | Discharge: 2022-12-04 | Disposition: A | Discharge status: Home / Self Care | Payer: PPO | Attending: Urology | Admitting: Urology

## 2022-12-04 VITALS — BP 146/83 | HR 52 | Ht 73.0 in | Wt 200.0 lb

## 2022-12-04 DIAGNOSIS — N200 Calculus of kidney: Secondary | ICD-10-CM | POA: Diagnosis not present

## 2022-12-04 DIAGNOSIS — N401 Enlarged prostate with lower urinary tract symptoms: Secondary | ICD-10-CM | POA: Diagnosis not present

## 2022-12-04 NOTE — Progress Notes (Signed)
12/04/2022 9:16 AM   [name] Goodman [birth year] [id number]  Referring provider: Maryland Pink, MD [address] [facility] [place],  [place] [address]  Chief Complaint  Patient presents with   Follow-up   Nephrolithiasis    1 year follow-up      Urologic history: 1.  BPH with urinary retention             -Status post TURP 02/2014   2.  Left nephrolithiasis             -Nonobstructing lower pole renal calculi             -Prior ureteroscopy/lithotripsy  HPI: 82 y.o. male presents for annual follow-up.  Since his last office visit he was hospitalized August 2023 for a CABG x 5; readmitted early September with occluded bypass grafts and underwent PCI/stent placement No bothersome LUTS Denies flank, abdominal or pelvic pain  PMH: Past Medical History:  Diagnosis Date   Arthritis    History of kidney stones    Hyperlipidemia    Hypertension    Nocturia    S/P CABG (coronary artery bypass graft)     Surgical History: Past Surgical History:  Procedure Laterality Date   APPENDECTOMY     CARDIOVERSION N/A 09/02/2022   Procedure: CARDIOVERSION;  Surgeon: Jolaine Artist, MD;  Location: [place];  Service: Cardiovascular;  Laterality: N/A;   CATARACT EXTRACTION W/PHACO Right 11/27/2020   Procedure: CATARACT EXTRACTION PHACO AND INTRAOCULAR LENS PLACEMENT (IOC) RIGHT VIVITY LENS toric 6.31 00.41.8;  Surgeon: Birder Robson, MD;  Location: [place];  Service: Ophthalmology;  Laterality: Right;   CORONARY ARTERY BYPASS GRAFT N/A 05/08/2022   Procedure: CORONARY ARTERY BYPASS GRAFTING (CABG) TIMES FIVE USING ENDOSCOPICALLY HARVESTED RIGHT GREATER SAPHENOUS VEIN AND LEFT OPEN RADIAL HARVEST VEIN AND LEFT INTERNALLY MAMMARY ARTERY.;  Surgeon: Melrose Nakayama, MD;  Location: [place];  Service: Open Heart Surgery;  Laterality: N/A;   CORONARY STENT INTERVENTION N/A 05/27/2022   Procedure: CORONARY STENT INTERVENTION;  Surgeon: Belva Crome,  MD;  Location: [place] [facility];  Service: Cardiovascular;  Laterality: N/A;   INTRAVASCULAR PRESSURE WIRE/FFR STUDY N/A 05/27/2022   Procedure: INTRAVASCULAR PRESSURE WIRE/FFR STUDY;  Surgeon: Belva Crome, MD;  Location: [facility];  Service: Cardiovascular;  Laterality: N/A;   IR THORACENTESIS ASP PLEURAL SPACE W/IMG GUIDE  05/19/2022   JOINT REPLACEMENT     KNEE ARTHROSCOPY Left    KNEE ARTHROSCOPY WITH LATERAL MENISECTOMY Right 11/13/2020   Procedure: Right knee arthroscopy, partial medial and lateral meniscectomy;  Surgeon: Hessie Knows, MD;  Location: [facility];  Service: Orthopedics;  Laterality: Right;   LEFT HEART CATH AND CORONARY ANGIOGRAPHY N/A 04/16/2022   Procedure: LEFT HEART CATH AND CORONARY ANGIOGRAPHY;  Surgeon: Isaias Cowman, MD;  Location: [facility];  Service: Cardiovascular;  Laterality: N/A;   POLYPECTOMY     WITH COLONOSCOPY   RADIAL ARTERY HARVEST Left 05/08/2022   Procedure: RADIAL ARTERY HARVEST;  Surgeon: Melrose Nakayama, MD;  Location: [place];  Service: Open Heart Surgery;  Laterality: Left;   RIGHT/LEFT HEART CATH AND CORONARY/GRAFT ANGIOGRAPHY N/A 05/23/2022   Procedure: RIGHT/LEFT HEART CATH AND CORONARY/GRAFT ANGIOGRAPHY;  Surgeon: Belva Crome, MD;  Location: [place] [facility];  Service: Cardiovascular;  Laterality: N/A;   TEE WITHOUT CARDIOVERSION N/A 05/08/2022   Procedure: TRANSESOPHAGEAL ECHOCARDIOGRAM (TEE);  Surgeon: Melrose Nakayama, MD;  Location: [place];  Service: Open Heart Surgery;  Laterality:  N/A;   TOTAL KNEE ARTHROPLASTY Left 07/23/2015   Procedure: LEFT TOTAL KNEE ARTHROPLASTY;  Surgeon: Gaynelle Arabian, MD;  Location: [facility];  Service: Orthopedics;  Laterality: Left;   TOTAL KNEE ARTHROPLASTY Right 06/11/2021   Procedure: TOTAL KNEE ARTHROPLASTY;  Surgeon: Hessie Knows, MD;  Location: [facility];  Service: Orthopedics;  Laterality: Right;   TRANSURETHRAL RESECTION OF PROSTATE  ?2015   URETEROSCOPY WITH HOLMIUM  LASER LITHOTRIPSY      Home Medications:  Allergies as of 12/04/2022   No Known Allergies      Medication List        Accurate as of December 04, 2022  9:16 AM. If you have any questions, ask your nurse or doctor.          amiodarone 200 MG tablet Commonly known as: PACERONE Take 200 mg by mouth daily.   apixaban 5 MG Tabs tablet Commonly known as: ELIQUIS Take 1 tablet (5 mg total) by mouth 2 (two) times daily.   atorvastatin 80 MG tablet Commonly known as: LIPITOR Take 1 tablet (80 mg total) by mouth daily.   clopidogrel 75 MG tablet Commonly known as: PLAVIX Take 1 tablet (75 mg total) by mouth daily.   cyanocobalamin 1000 MCG tablet Commonly known as: VITAMIN B12 Take 1,000 mcg by mouth daily.   empagliflozin 10 MG Tabs tablet Commonly known as: JARDIANCE Take 1 tablet (10 mg total) by mouth daily.   Entresto 24-26 MG Generic drug: sacubitril-valsartan Take 1 tablet by mouth 2 (two) times daily.   furosemide 20 MG tablet Commonly known as: LASIX Take 20 mg by mouth as needed for edema or fluid.   metoprolol succinate 50 MG 24 hr tablet Commonly known as: TOPROL-XL Take 1 tablet (50 mg total) by mouth daily. Take with or immediately following a meal.        Allergies: No Known Allergies  Family History: Family History  Problem Relation Age of Onset   Hypertension Father    Cancer Father     Social History:  reports that he quit smoking about 47 years ago. His smoking use included cigarettes. He has a 15.00 pack-year smoking history. He has been exposed to tobacco smoke. He has never used smokeless tobacco. He reports that he does not drink alcohol and does not use drugs.   Physical Exam: BP (!) 146/83   Pulse (!) 52   Ht '6\' 1"'$  (1.854 m)   Wt 200 lb (90.7 kg)   BMI 26.39 kg/m   Constitutional:  Alert and oriented, No acute distress. HEENT: [place] AT Respiratory: Normal respiratory effort, no increased work of breathing.   Pertinent  Imaging: Images of a KUB performed this morning were personally viewed and interpreted.  There is a new calcification overlying the left renal outline however based on his KUB of last year this may have migrated from the previously noted lower pole calculi   Assessment & Plan:    1.  BPH with LUTS Mild voiding symptoms which are stable  2.  Left nephrolithiasis Stable left lower pole calculi 1 year follow-up with [place], MD  [facility] [address], [place], [place] [address] [phone]

## 2022-12-08 ENCOUNTER — Telehealth (HOSPITAL_COMMUNITY): Payer: Self-pay

## 2022-12-08 ENCOUNTER — Other Ambulatory Visit (HOSPITAL_COMMUNITY): Payer: Self-pay

## 2022-12-08 DIAGNOSIS — I5022 Chronic systolic (congestive) heart failure: Secondary | ICD-10-CM

## 2022-12-08 NOTE — Telephone Encounter (Signed)
Patient aware of labs scheduled follow up labs.

## 2022-12-10 ENCOUNTER — Ambulatory Visit (HOSPITAL_COMMUNITY)
Admission: RE | Admit: 2022-12-10 | Discharge: 2022-12-10 | Disposition: A | Discharge status: Home / Self Care | Payer: PPO | Source: Ambulatory Visit | Attending: Cardiology | Admitting: Cardiology

## 2022-12-10 DIAGNOSIS — I5022 Chronic systolic (congestive) heart failure: Secondary | ICD-10-CM | POA: Insufficient documentation

## 2022-12-10 LAB — BASIC METABOLIC PANEL
Anion gap: 10 (ref 5–15)
BUN: 22 mg/dL (ref 8–23)
CO2: 23 mmol/L (ref 22–32)
Calcium: 9 mg/dL (ref 8.9–10.3)
Chloride: 105 mmol/L (ref 98–111)
Creatinine, Ser: 1.57 mg/dL — ABNORMAL HIGH (ref 0.61–1.24)
GFR, Estimated: 44 mL/min — ABNORMAL LOW (ref >60)
Glucose, Bld: 83 mg/dL (ref 70–99)
Potassium: 4.6 mmol/L (ref 3.5–5.1)
Sodium: 138 mmol/L (ref 135–145)

## 2022-12-24 ENCOUNTER — Telehealth: Payer: Self-pay | Admitting: Cardiovascular Disease

## 2022-12-24 NOTE — Telephone Encounter (Signed)
Pt is calling because he had an appt on 03/13 for a MR CARD MORPHOLOGY WO/W CM, but he is scheduled on 04/15 for a CT CARDIAC MORPH/PULM VEIN W/C. Pt is confused on the two scans and would like to understand if there is a difference between the two scans. Pt would like to know if he needs to have this appt or if he should cancel it. Please advise

## 2022-12-24 NOTE — Telephone Encounter (Signed)
I spoke w Drue Dun, RN who placed order for the CT.  This is not needed due to the ablation being cancelled.  I called the patient and let him know that is not needed and I cancelled the appointment.  Pt appreciative for assistance.

## 2022-12-29 ENCOUNTER — Ambulatory Visit: Payer: PPO

## 2023-01-05 ENCOUNTER — Ambulatory Visit (HOSPITAL_BASED_OUTPATIENT_CLINIC_OR_DEPARTMENT_OTHER): Payer: PPO

## 2023-01-12 ENCOUNTER — Encounter (HOSPITAL_COMMUNITY): Payer: Self-pay

## 2023-01-12 ENCOUNTER — Ambulatory Visit (HOSPITAL_COMMUNITY): Admit: 2023-01-12 | Payer: PPO | Admitting: Cardiovascular Disease

## 2023-01-12 SURGERY — ATRIAL FIBRILLATION ABLATION
Anesthesia: General

## 2023-02-01 NOTE — Progress Notes (Unsigned)
ADVANCED HF CLINIC NOTE   Primary Care: [name], MD Primary Cardiologist: Dr. Darrold Junker HF Cardiologist: Dr. Gala Romney  HPI: [name] Goodman is an 82 y.o.male with CAD s/p CABG 08/23, HFmEF, atrial fibrillation, HTN.    Underwent CABG X 5 in 8/23. Intra-op TEE with LVEF 40-45%. Had postoperative AF and developed bradycardia on amiodarone drip. Converted to SR.    Readmitted on 05/18/22 with segmental and subsegmental PE RML/RUL, CHF, left pleural effusion s/p thoracentesis and recurrent atrial fibrillation. Echo EF 25-30%, mildly RV HK. Converted to SR during admit. PVCs also noted on tele. Had early graft failure with occlusion of SVG to Diag and SVG to RCA occluded. LIMA to LAD and radial jump graft to OM and RPLV patent. Underwent successful staged PCI/DES to distal RCA and PCI/DES p Cx into OM1.   Zio 9/23 showed AF/AFL burden 84% with PVC burden of 18.6%. Started on amiodarone.  F/u 10/23, stable NYHA II-early III and volume OK.  Zio 11/23 Continuous AFL (avg 76 bpm) PVC 10.7% (2 morphologies 6.0% & 4.7%)   Echo 08/27/22 EF 25-30% mod-sev MR.  Had DC-CV 09/02/22   cMRI 3/24: EF 43% RVEF 40% Mod MR. Coronary pattern LGE in the mid inferoseptal wall (>50% wall thickness, unlikely to be viable) and the basal inferolateral wall (<50% wall thickness, likely viable).  Today he returns for HF follow up. Doing great. Walking 4 miles per day and playing golf. Denies CP, SOB, orthopnea or PND. Complaint with meds.    Past Medical History:  Diagnosis Date   Arthritis    History of kidney stones    Hyperlipidemia    Hypertension    Nocturia    S/P CABG (coronary artery bypass graft)     Current Outpatient Medications  Medication Sig Dispense Refill   amiodarone (PACERONE) 200 MG tablet Take 200 mg by mouth daily.     apixaban (ELIQUIS) 5 MG TABS tablet Take 1 tablet (5 mg total) by mouth 2 (two) times daily. 60 tablet 6   atorvastatin (LIPITOR) 80 MG tablet Take 1  tablet (80 mg total) by mouth daily. 30 tablet 7   clopidogrel (PLAVIX) 75 MG tablet Take 1 tablet (75 mg total) by mouth daily. 30 tablet 7   cyanocobalamin (VITAMIN B12) 1000 MCG tablet Take 1,000 mcg by mouth daily.     empagliflozin (JARDIANCE) 10 MG TABS tablet Take 1 tablet (10 mg total) by mouth daily. 30 tablet 7   furosemide (LASIX) 20 MG tablet Take 20 mg by mouth as needed for edema or fluid.     sacubitril-valsartan (ENTRESTO) 24-26 MG Take 1 tablet by mouth 2 (two) times daily. 60 tablet 11   metoprolol succinate (TOPROL-XL) 50 MG 24 hr tablet Take 1 tablet (50 mg total) by mouth daily. Take with or immediately following a meal. (Patient not taking: Reported on 02/02/2023) 30 tablet 11   No current facility-administered medications for this visit.   No Known Allergies  Social History   Socioeconomic History   Marital status: Divorced    Spouse name: Not on file   Number of children: 3   Years of education: Not on file   Highest education level: Master's degree (e.g., MA, MS, MEng, MEd, MSW, MBA)  Occupational History   Occupation: retired Psychologist, occupational at [employer]  Tobacco Use   Smoking status: Former    Packs/day: 1.00    Years: 15.00    Additional pack years: 0.00    Total pack years: 15.00  Types: Cigarettes    Quit date: 07/17/1975    Years since quitting: 47.5    Passive exposure: Past   Smokeless tobacco: Never  Vaping Use   Vaping Use: Never used  Substance and Sexual Activity   Alcohol use: No   Drug use: No   Sexual activity: Not on file  Other Topics Concern   Not on file  Social History Narrative   Lives alone   Social Determinants of Health   Financial Resource Strain: Low Risk  (05/20/2022)   Overall Financial Resource Strain (CARDIA)    Difficulty of Paying Living Expenses: Not hard at all  Food Insecurity: No Food Insecurity (05/20/2022)   Hunger Vital Sign    Worried About Running Out of Food in the Last Year: Never true    Ran Out of Food in the  Last Year: Never true  Transportation Needs: No Transportation Needs (05/20/2022)   PRAPARE - Administrator, Civil Service (Medical): No    Lack of Transportation (Non-Medical): No  Physical Activity: Not on file  Stress: Not on file  Social Connections: Not on file  Intimate Partner Violence: Not on file   Family History  Problem Relation Age of Onset   Hypertension Father    Cancer Father    BP (!) 142/86   Pulse 68   Wt 207 lb 3.2 oz (94 kg)   SpO2 100%   BMI 27.34 kg/m   Wt Readings from Last 3 Encounters:  02/02/23 207 lb 3.2 oz (94 kg)  12/04/22 200 lb (90.7 kg)  11/28/22 200 lb 3.2 oz (90.8 kg)   PHYSICAL EXAM: General:  Well appearing. No resp difficulty HEENT: normal Neck: supple. no JVD. Carotids 2+ bilat; no bruits. No lymphadenopathy or thryomegaly appreciated. Cor: PMI nondisplaced. Regular rate & rhythm. No rubs, gallops or murmurs. Lungs: clear Abdomen: soft, nontender, nondistended. No hepatosplenomegaly. No bruits or masses. Good bowel sounds. Extremities: no cyanosis, clubbing, rash, edema Severe varicose veins Neuro: alert & orientedx3, cranial nerves grossly intact. moves all 4 extremities w/o difficulty. Affect pleasant   ASSESSMENT & PLAN:  Chronic systolic CHF - pre-CABG LV gram (7/23): EF 35-40%  - Intraop TEE 05/08/22: EF 40-45% - Limited echo 05/19/22: EF 25-30%, RV mildly reduced - -S/p PCI/DES to RCA and PCI/DES to p Lcx into OM1 9/23 (see below).  - Echo 08/27/22 EF 25-30% moderate to severe MR. - cMRI 3/24: EF 43% RVEF 40% Mod MR. Coronary pattern LGE in the mid inferoseptal wall (>50% wall thickness, unlikely to be viable) and the basal inferolateral wall (<50% wall thickness, likely viable). - suspect primarily iCM. Had early graft failure post CABG. vs PVC  - Doing well. NYHA I. Volume looks ok on exam. Continue lasix prn - Continue Entresto 24/26 mg bid.  - Off spiro due to AKI and hyperkalemia - Continue Jardiance 10 mg  daily. - Restart Toprol 50 (he stopped accidentally at last visit)  2. CAD - S/p CABG X 5 (LIMA to LAD, SVG to PDA, SVG to D1, sequential left radial artery to OM and PL) 05/08/22 - Early graft failure with occlusion of SVG to D1 and SVG to RCA. LIMA to LAD and radial graft to OM1 and RPLV patent - S/p PCI/DES to RCA and PCI/DES p Lcx into OM1 05/27/22. - No s/s angina - Continue Plavix and Eliquis. - Continue statin  3. Persistent atrial fibrillation/AFL  - Noted post CABG. - Had recurrence at time of recent admission. Converted  to SR spontaneously.  - Zio 2 week (9/23) showed mostly AF/AFL 84% burden, frequent PVCs (18.6%) - Zio 11/23 100% AFL  - s/p DC-CV 12/23 - Has seen Dr. Nelly Laurence and discussed possible ablation in 4/24. Has cancelled.  - Will decrease amio to 100mg  daily for now - In NSR today - Continue Eliquis 5 mg bid. - Check labs today  4. Frequent PVCs - Zio 2 week (9/23): PVCs 18.6% - Previously discussed sleep study, he was not interested - Repeat Zio 2 11/23 10.7% PVCs - remains on amio. Will decrease amio to 100mg  daily - Repeat zio to reassess AF and PVC burden - Amio labs today   5. HTN - Blood pressure well controlled. Continue current regimen.   6. CKD IIIa - Baseline Scr 1.3-1.5 - Labs today   7. Recent PE - Post CABG - Continue Eliquis 5 mg bid. (On for AF)   8. Mitral regurgitation - moderate to severe on previosus echo - likely ischemic (vs functional) - cMRI 3/24 moderate MR - Continue to follow   Arvilla Meres, MD  2:00 PM

## 2023-02-02 ENCOUNTER — Other Ambulatory Visit
Admission: RE | Admit: 2023-02-02 | Discharge: 2023-02-02 | Disposition: A | Discharge status: Home / Self Care | Payer: PPO | Source: Ambulatory Visit | Attending: Internal Medicine | Admitting: Internal Medicine

## 2023-02-02 ENCOUNTER — Encounter: Payer: Self-pay | Admitting: Internal Medicine

## 2023-02-02 ENCOUNTER — Inpatient Hospital Stay (HOSPITAL_COMMUNITY)
Admission: RE | Admit: 2023-02-02 | Discharge: 2023-02-02 | Disposition: A | Discharge status: Home / Self Care | Payer: PPO | Source: Ambulatory Visit

## 2023-02-02 ENCOUNTER — Other Ambulatory Visit (HOSPITAL_COMMUNITY): Payer: Self-pay | Admitting: Internal Medicine

## 2023-02-02 ENCOUNTER — Ambulatory Visit (HOSPITAL_BASED_OUTPATIENT_CLINIC_OR_DEPARTMENT_OTHER): Payer: PPO | Admitting: Internal Medicine

## 2023-02-02 VITALS — BP 142/86 | HR 68 | Wt 207.2 lb

## 2023-02-02 DIAGNOSIS — I480 Paroxysmal atrial fibrillation: Secondary | ICD-10-CM | POA: Diagnosis not present

## 2023-02-02 DIAGNOSIS — I2510 Atherosclerotic heart disease of native coronary artery without angina pectoris: Secondary | ICD-10-CM

## 2023-02-02 DIAGNOSIS — I493 Ventricular premature depolarization: Secondary | ICD-10-CM

## 2023-02-02 DIAGNOSIS — I5022 Chronic systolic (congestive) heart failure: Secondary | ICD-10-CM | POA: Diagnosis not present

## 2023-02-02 LAB — COMPREHENSIVE METABOLIC PANEL
ALT: 19 U/L (ref 0–44)
AST: 16 U/L (ref 15–41)
Albumin: 4.6 g/dL (ref 3.5–5.0)
Alkaline Phosphatase: 78 U/L (ref 38–126)
Anion gap: 9 (ref 5–15)
BUN: 22 mg/dL (ref 8–23)
CO2: 26 mmol/L (ref 22–32)
Calcium: 9.4 mg/dL (ref 8.9–10.3)
Chloride: 106 mmol/L (ref 98–111)
Creatinine, Ser: 1.4 mg/dL — ABNORMAL HIGH (ref 0.61–1.24)
GFR, Estimated: 50 mL/min — ABNORMAL LOW (ref >60)
Glucose, Bld: 104 mg/dL — ABNORMAL HIGH (ref 70–99)
Potassium: 4.2 mmol/L (ref 3.5–5.1)
Sodium: 141 mmol/L (ref 135–145)
Total Bilirubin: 1.5 mg/dL — ABNORMAL HIGH (ref 0.3–1.2)
Total Protein: 7.3 g/dL (ref 6.5–8.1)

## 2023-02-02 LAB — CBC
HCT: 52.7 % — ABNORMAL HIGH (ref 39.0–52.0)
Hemoglobin: 16.7 g/dL (ref 13.0–17.0)
MCH: 30.1 pg (ref 26.0–34.0)
MCHC: 31.7 g/dL (ref 30.0–36.0)
MCV: 95 fL (ref 80.0–100.0)
Platelets: 162 10*3/uL (ref 150–400)
RBC: 5.55 MIL/uL (ref 4.22–5.81)
RDW: 13.9 % (ref 11.5–15.5)
WBC: 5.2 10*3/uL (ref 4.0–10.5)
nRBC: 0 % (ref 0.0–0.2)

## 2023-02-02 LAB — TSH: TSH: 2.58 u[IU]/mL (ref 0.350–4.500)

## 2023-02-02 LAB — T4, FREE: Free T4: 0.83 ng/dL (ref 0.61–1.12)

## 2023-02-02 MED ORDER — AMIODARONE HCL 100 MG PO TABS: 100.0000 mg | ORAL_TABLET | Freq: Every day | ORAL | 3 refills | Status: DC

## 2023-02-02 MED ORDER — METOPROLOL SUCCINATE ER 50 MG PO TB24: 50.0000 mg | ORAL_TABLET | Freq: Every day | ORAL | 6 refills | Status: DC

## 2023-02-02 NOTE — Patient Instructions (Signed)
Medication Changes:  Decrease Amiodarone to 100 mg (1 tablet)  daily.  Restart Toprol 50 mg (1 tablet)  daily.  Lab Work:  Labs done today, your results will be available in MyChart, we will contact you for abnormal readings.   Testing/Procedures:  Your provider has recommended that  you wear a Zio Patch for 7 days.  This monitor will record your heart rhythm for our review.  IF you have any symptoms while wearing the monitor please press the button.  If you have any issues with the patch or you notice a red or orange light on it please call the company at [phone].  Once you remove the patch please mail it back to the company as soon as possible so we can get the results.  Zio patch placed onto patient.  All instructions and information reviewed with patient, they verbalize understanding with no questions.     Special Instructions // Education:  Do the following things EVERYDAY: Weigh yourself in the morning before breakfast. Write it down and keep it in a log. Take your medicines as prescribed Eat low salt foods--Limit salt (sodium) to 2000 mg per day.  Stay as active as you can everyday Limit all fluids for the day to less than 2 liters   Follow-Up in: follow up in 2 months with Dr. Gala Romney.    If you have any questions or concerns before your next appointment please send [place] a message through Enterprise or call our office at [phone] Monday-Friday 8 am-5 pm.   If you have an urgent need after hours on the weekend please call your Primary Cardiologist or the [facility] in [place] at [phone].

## 2023-02-03 LAB — T3, FREE: T3, Free: 2.8 pg/mL (ref 2.0–4.4)

## 2023-02-09 ENCOUNTER — Ambulatory Visit (HOSPITAL_COMMUNITY): Payer: PPO | Admitting: Physician Assistant

## 2023-02-12 DIAGNOSIS — I493 Ventricular premature depolarization: Secondary | ICD-10-CM | POA: Diagnosis not present

## 2023-02-24 DIAGNOSIS — E119 Type 2 diabetes mellitus without complications: Secondary | ICD-10-CM | POA: Diagnosis not present

## 2023-02-24 DIAGNOSIS — Z0000 Encounter for general adult medical examination without abnormal findings: Secondary | ICD-10-CM | POA: Diagnosis not present

## 2023-02-24 DIAGNOSIS — N1832 Chronic kidney disease, stage 3b: Secondary | ICD-10-CM | POA: Diagnosis not present

## 2023-02-24 DIAGNOSIS — I5022 Chronic systolic (congestive) heart failure: Secondary | ICD-10-CM | POA: Diagnosis not present

## 2023-02-24 DIAGNOSIS — I25810 Atherosclerosis of coronary artery bypass graft(s) without angina pectoris: Secondary | ICD-10-CM | POA: Diagnosis not present

## 2023-02-24 DIAGNOSIS — I480 Paroxysmal atrial fibrillation: Secondary | ICD-10-CM | POA: Diagnosis not present

## 2023-02-24 DIAGNOSIS — I10 Essential (primary) hypertension: Secondary | ICD-10-CM | POA: Diagnosis not present

## 2023-02-24 DIAGNOSIS — I2699 Other pulmonary embolism without acute cor pulmonale: Secondary | ICD-10-CM | POA: Diagnosis not present

## 2023-02-24 DIAGNOSIS — E7800 Pure hypercholesterolemia, unspecified: Secondary | ICD-10-CM | POA: Diagnosis not present

## 2023-03-16 ENCOUNTER — Observation Stay (HOSPITAL_COMMUNITY)
Admission: EM | Admit: 2023-03-16 | Discharge: 2023-03-17 | Disposition: A | Discharge status: Home / Self Care | Payer: PPO | Attending: Internal Medicine | Admitting: Internal Medicine

## 2023-03-16 ENCOUNTER — Other Ambulatory Visit: Payer: Self-pay

## 2023-03-16 ENCOUNTER — Encounter (HOSPITAL_COMMUNITY): Payer: Self-pay | Admitting: Internal Medicine

## 2023-03-16 ENCOUNTER — Emergency Department (HOSPITAL_COMMUNITY): Payer: PPO

## 2023-03-16 DIAGNOSIS — E785 Hyperlipidemia, unspecified: Secondary | ICD-10-CM | POA: Diagnosis present

## 2023-03-16 DIAGNOSIS — Z7901 Long term (current) use of anticoagulants: Secondary | ICD-10-CM | POA: Diagnosis not present

## 2023-03-16 DIAGNOSIS — I110 Hypertensive heart disease with heart failure: Secondary | ICD-10-CM | POA: Diagnosis not present

## 2023-03-16 DIAGNOSIS — R7303 Prediabetes: Secondary | ICD-10-CM | POA: Diagnosis present

## 2023-03-16 DIAGNOSIS — I509 Heart failure, unspecified: Secondary | ICD-10-CM

## 2023-03-16 DIAGNOSIS — Z87891 Personal history of nicotine dependence: Secondary | ICD-10-CM | POA: Insufficient documentation

## 2023-03-16 DIAGNOSIS — I2510 Atherosclerotic heart disease of native coronary artery without angina pectoris: Secondary | ICD-10-CM | POA: Diagnosis present

## 2023-03-16 DIAGNOSIS — J9811 Atelectasis: Secondary | ICD-10-CM | POA: Diagnosis not present

## 2023-03-16 DIAGNOSIS — I5023 Acute on chronic systolic (congestive) heart failure: Secondary | ICD-10-CM | POA: Diagnosis not present

## 2023-03-16 DIAGNOSIS — I482 Chronic atrial fibrillation, unspecified: Secondary | ICD-10-CM | POA: Diagnosis present

## 2023-03-16 DIAGNOSIS — I4891 Unspecified atrial fibrillation: Secondary | ICD-10-CM | POA: Diagnosis not present

## 2023-03-16 DIAGNOSIS — N1831 Chronic kidney disease, stage 3a: Secondary | ICD-10-CM | POA: Insufficient documentation

## 2023-03-16 DIAGNOSIS — I129 Hypertensive chronic kidney disease with stage 1 through stage 4 chronic kidney disease, or unspecified chronic kidney disease: Secondary | ICD-10-CM | POA: Diagnosis not present

## 2023-03-16 DIAGNOSIS — Z951 Presence of aortocoronary bypass graft: Secondary | ICD-10-CM | POA: Diagnosis not present

## 2023-03-16 DIAGNOSIS — R0602 Shortness of breath: Secondary | ICD-10-CM | POA: Diagnosis not present

## 2023-03-16 DIAGNOSIS — Z1152 Encounter for screening for COVID-19: Secondary | ICD-10-CM | POA: Insufficient documentation

## 2023-03-16 DIAGNOSIS — I10 Essential (primary) hypertension: Secondary | ICD-10-CM | POA: Diagnosis present

## 2023-03-16 DIAGNOSIS — I5022 Chronic systolic (congestive) heart failure: Secondary | ICD-10-CM

## 2023-03-16 HISTORY — DX: Unspecified atrial fibrillation: I48.91

## 2023-03-16 HISTORY — DX: Chronic systolic (congestive) heart failure: I50.22

## 2023-03-16 LAB — RESP PANEL BY RT-PCR (RSV, FLU A&B, COVID)  RVPGX2
Influenza A by PCR: NEGATIVE
Influenza B by PCR: NEGATIVE
Resp Syncytial Virus by PCR: NEGATIVE
SARS Coronavirus 2 by RT PCR: NEGATIVE

## 2023-03-16 LAB — BASIC METABOLIC PANEL
Anion gap: 14 (ref 5–15)
BUN: 19 mg/dL (ref 8–23)
CO2: 24 mmol/L (ref 22–32)
Calcium: 9.4 mg/dL (ref 8.9–10.3)
Chloride: 105 mmol/L (ref 98–111)
Creatinine, Ser: 1.6 mg/dL — ABNORMAL HIGH (ref 0.61–1.24)
GFR, Estimated: 43 mL/min — ABNORMAL LOW (ref >60)
Glucose, Bld: 160 mg/dL — ABNORMAL HIGH (ref 70–99)
Potassium: 4.4 mmol/L (ref 3.5–5.1)
Sodium: 143 mmol/L (ref 135–145)

## 2023-03-16 LAB — CBC
HCT: 49.7 % (ref 39.0–52.0)
Hemoglobin: 15.6 g/dL (ref 13.0–17.0)
MCH: 30.2 pg (ref 26.0–34.0)
MCHC: 31.4 g/dL (ref 30.0–36.0)
MCV: 96.3 fL (ref 80.0–100.0)
Platelets: 166 10*3/uL (ref 150–400)
RBC: 5.16 MIL/uL (ref 4.22–5.81)
RDW: 14.4 % (ref 11.5–15.5)
WBC: 5.4 10*3/uL (ref 4.0–10.5)
nRBC: 0 % (ref 0.0–0.2)

## 2023-03-16 LAB — HEMOGLOBIN A1C
Hgb A1c MFr Bld: 6.3 % — ABNORMAL HIGH (ref 4.8–5.6)
Mean Plasma Glucose: 134.11 mg/dL

## 2023-03-16 LAB — CBG MONITORING, ED
Glucose-Capillary: 95 mg/dL (ref 70–99)
Glucose-Capillary: 103 mg/dL — ABNORMAL HIGH (ref 70–99)

## 2023-03-16 LAB — TROPONIN I (HIGH SENSITIVITY)
Troponin I (High Sensitivity): 10 ng/L (ref <18)
Troponin I (High Sensitivity): 9 ng/L (ref <18)

## 2023-03-16 LAB — BRAIN NATRIURETIC PEPTIDE: B Natriuretic Peptide: 1998.7 pg/mL — ABNORMAL HIGH (ref 0.0–100.0)

## 2023-03-16 MED ORDER — BISACODYL 5 MG PO TBEC: 5.0000 mg | DELAYED_RELEASE_TABLET | Freq: Every day | ORAL | Status: DC | PRN

## 2023-03-16 MED ORDER — FUROSEMIDE 10 MG/ML IJ SOLN: 40.0000 mg | Freq: Two times a day (BID) | INTRAMUSCULAR | Status: DC

## 2023-03-16 MED ORDER — FUROSEMIDE 10 MG/ML IJ SOLN: 20.0000 mg | Freq: Two times a day (BID) | INTRAMUSCULAR | Status: DC

## 2023-03-16 MED ORDER — AMIODARONE HCL 200 MG PO TABS: 100.0000 mg | ORAL_TABLET | Freq: Every day | ORAL | Status: DC

## 2023-03-16 MED ORDER — FUROSEMIDE 10 MG/ML IJ SOLN
40.0000 mg | Freq: Two times a day (BID) | INTRAMUSCULAR | Status: DC
  Administered 2023-03-16 – 2023-03-17 (×2): 40 mg via INTRAVENOUS
  Filled 2023-03-16 (×2): qty 4

## 2023-03-16 MED ORDER — ONDANSETRON HCL 4 MG PO TABS: 4.0000 mg | ORAL_TABLET | Freq: Four times a day (QID) | ORAL | Status: DC | PRN

## 2023-03-16 MED ORDER — METOPROLOL SUCCINATE ER 25 MG PO TB24
25.0000 mg | ORAL_TABLET | Freq: Every day | ORAL | Status: DC
  Administered 2023-03-17: 25 mg via ORAL
  Filled 2023-03-16 (×2): qty 1

## 2023-03-16 MED ORDER — ATORVASTATIN CALCIUM 80 MG PO TABS
80.0000 mg | ORAL_TABLET | Freq: Every day | ORAL | Status: DC
  Administered 2023-03-17: 80 mg via ORAL
  Filled 2023-03-16: qty 1

## 2023-03-16 MED ORDER — ACETAMINOPHEN 325 MG PO TABS: 650.0000 mg | ORAL_TABLET | Freq: Four times a day (QID) | ORAL | Status: DC | PRN

## 2023-03-16 MED ORDER — INSULIN ASPART 100 UNIT/ML IJ SOLN: 0.0000 [IU] | Freq: Three times a day (TID) | INTRAMUSCULAR | Status: DC

## 2023-03-16 MED ORDER — INSULIN ASPART 100 UNIT/ML IJ SOLN: 0.0000 [IU] | Freq: Every day | INTRAMUSCULAR | Status: DC

## 2023-03-16 MED ORDER — ENOXAPARIN SODIUM 40 MG/0.4ML IJ SOSY: 40.0000 mg | PREFILLED_SYRINGE | INTRAMUSCULAR | Status: DC

## 2023-03-16 MED ORDER — HYDRALAZINE HCL 20 MG/ML IJ SOLN: 5.0000 mg | INTRAMUSCULAR | Status: DC | PRN

## 2023-03-16 MED ORDER — FUROSEMIDE 10 MG/ML IJ SOLN
20.0000 mg | Freq: Once | INTRAMUSCULAR | Status: AC
  Administered 2023-03-16: 20 mg via INTRAVENOUS
  Filled 2023-03-16: qty 2

## 2023-03-16 MED ORDER — AMIODARONE HCL 200 MG PO TABS
200.0000 mg | ORAL_TABLET | Freq: Two times a day (BID) | ORAL | Status: DC
  Administered 2023-03-16 – 2023-03-17 (×2): 200 mg via ORAL
  Filled 2023-03-16 (×2): qty 1

## 2023-03-16 MED ORDER — APIXABAN 5 MG PO TABS
5.0000 mg | ORAL_TABLET | Freq: Two times a day (BID) | ORAL | Status: DC
  Administered 2023-03-16 – 2023-03-17 (×2): 5 mg via ORAL
  Filled 2023-03-16 (×2): qty 1

## 2023-03-16 MED ORDER — TRAZODONE HCL 50 MG PO TABS: 25.0000 mg | ORAL_TABLET | Freq: Every evening | ORAL | Status: DC | PRN

## 2023-03-16 MED ORDER — SODIUM CHLORIDE 0.9% FLUSH
3.0000 mL | Freq: Two times a day (BID) | INTRAVENOUS | Status: DC
  Administered 2023-03-16 – 2023-03-17 (×2): 3 mL via INTRAVENOUS

## 2023-03-16 MED ORDER — METOPROLOL SUCCINATE ER 25 MG PO TB24: 50.0000 mg | ORAL_TABLET | Freq: Every day | ORAL | Status: DC

## 2023-03-16 MED ORDER — EMPAGLIFLOZIN 10 MG PO TABS
10.0000 mg | ORAL_TABLET | Freq: Every day | ORAL | Status: DC
  Administered 2023-03-17: 10 mg via ORAL
  Filled 2023-03-16: qty 1

## 2023-03-16 MED ORDER — CLOPIDOGREL BISULFATE 75 MG PO TABS
75.0000 mg | ORAL_TABLET | Freq: Every day | ORAL | Status: DC
  Administered 2023-03-17: 75 mg via ORAL
  Filled 2023-03-16: qty 1

## 2023-03-16 MED ORDER — SACUBITRIL-VALSARTAN 24-26 MG PO TABS
1.0000 | ORAL_TABLET | Freq: Two times a day (BID) | ORAL | Status: DC
  Administered 2023-03-16 – 2023-03-17 (×2): 1 via ORAL
  Filled 2023-03-16 (×2): qty 1

## 2023-03-16 MED ORDER — DOCUSATE SODIUM 100 MG PO CAPS
100.0000 mg | ORAL_CAPSULE | Freq: Two times a day (BID) | ORAL | Status: DC
  Administered 2023-03-17: 100 mg via ORAL
  Filled 2023-03-16: qty 1

## 2023-03-16 MED ORDER — ACETAMINOPHEN 650 MG RE SUPP: 650.0000 mg | Freq: Four times a day (QID) | RECTAL | Status: DC | PRN

## 2023-03-16 MED ORDER — ONDANSETRON HCL 4 MG/2ML IJ SOLN: 4.0000 mg | Freq: Four times a day (QID) | INTRAMUSCULAR | Status: DC | PRN

## 2023-03-16 MED ORDER — POLYETHYLENE GLYCOL 3350 17 G PO PACK: 17.0000 g | PACK | Freq: Every day | ORAL | Status: DC | PRN

## 2023-03-16 NOTE — ED Notes (Signed)
ED TO INPATIENT HANDOFF REPORT  ED Nurse Name and Phone #: [phone] Yvonne Petite  S Name/Age/Gender [name] Goodman 82 y.o. male Room/Bed: 038C/038C  Code Status   Code Status: Full Code  Home/SNF/Other Home Patient oriented to: self, place, time, and situation Is this baseline? Yes   Triage Complete: Triage complete  Chief Complaint Acute on chronic systolic (congestive) heart failure (HCC) [I50.23]  Triage Note Pt. Stated, Lavenia Atlas been SOB for a week. Denies any other symptoms. I had this when I had my open heart surgery.    Allergies No Known Allergies  Level of Care/Admitting Diagnosis ED Disposition     ED Disposition  Admit   Condition  --   Comment  Hospital Area: [facility] [[id number]]  Level of Care: Telemetry Cardiac [103]  May place patient in observation at [facility] or [facility] if equivalent level of care is available:: No  Covid Evaluation: Confirmed COVID Negative  Diagnosis: Acute on chronic systolic (congestive) heart failure [facility]) [[id number]]  Admitting Physician: Jonah Blue [[id number]]  Attending Physician: Jonah Blue [[id number]]          B Medical/Surgery History Past Medical History:  Diagnosis Date   Arthritis    Atrial fibrillation (HCC)    Chronic systolic (congestive) heart failure (HCC)    History of kidney stones    Hyperlipidemia    Hypertension    Nocturia    S/P CABG (coronary artery bypass graft)    Past Surgical History:  Procedure Laterality Date   APPENDECTOMY     CARDIOVERSION N/A 09/02/2022   Procedure: CARDIOVERSION;  Surgeon: Dolores Patty, MD;  Location: [facility] ENDOSCOPY;  Service: Cardiovascular;  Laterality: N/A;   CATARACT EXTRACTION W/PHACO Right 11/27/2020   Procedure: CATARACT EXTRACTION PHACO AND INTRAOCULAR LENS PLACEMENT (IOC) RIGHT VIVITY LENS toric 6.31 00.41.8;  Surgeon: Galen Manila, MD;  Location: [facility] SURGERY CNTR;  Service: Ophthalmology;  Laterality: Right;   CORONARY  ARTERY BYPASS GRAFT N/A 05/08/2022   Procedure: CORONARY ARTERY BYPASS GRAFTING (CABG) TIMES FIVE USING ENDOSCOPICALLY HARVESTED RIGHT GREATER SAPHENOUS VEIN AND LEFT OPEN RADIAL HARVEST VEIN AND LEFT INTERNALLY MAMMARY ARTERY.;  Surgeon: Loreli Slot, MD;  Location: [facility] OR;  Service: Open Heart Surgery;  Laterality: N/A;   CORONARY PRESSURE/FFR STUDY N/A 05/27/2022   Procedure: INTRAVASCULAR PRESSURE WIRE/FFR STUDY;  Surgeon: Lyn Records, MD;  Location: [facility] INVASIVE CV LAB;  Service: Cardiovascular;  Laterality: N/A;   CORONARY STENT INTERVENTION N/A 05/27/2022   Procedure: CORONARY STENT INTERVENTION;  Surgeon: Lyn Records, MD;  Location: [facility] INVASIVE CV LAB;  Service: Cardiovascular;  Laterality: N/A;   IR THORACENTESIS ASP PLEURAL SPACE W/IMG GUIDE  05/19/2022   JOINT REPLACEMENT     KNEE ARTHROSCOPY Left    KNEE ARTHROSCOPY WITH LATERAL MENISECTOMY Right 11/13/2020   Procedure: Right knee arthroscopy, partial medial and lateral meniscectomy;  Surgeon: Kennedy Bucker, MD;  Location: [facility] ORS;  Service: Orthopedics;  Laterality: Right;   LEFT HEART CATH AND CORONARY ANGIOGRAPHY N/A 04/16/2022   Procedure: LEFT HEART CATH AND CORONARY ANGIOGRAPHY;  Surgeon: Marcina Millard, MD;  Location: [facility] INVASIVE CV LAB;  Service: Cardiovascular;  Laterality: N/A;   POLYPECTOMY     WITH COLONOSCOPY   RADIAL ARTERY HARVEST Left 05/08/2022   Procedure: RADIAL ARTERY HARVEST;  Surgeon: Loreli Slot, MD;  Location: [facility] OR;  Service: Open Heart Surgery;  Laterality: Left;   RIGHT/LEFT HEART CATH AND CORONARY/GRAFT ANGIOGRAPHY N/A 05/23/2022   Procedure: RIGHT/LEFT HEART CATH AND CORONARY/GRAFT  ANGIOGRAPHY;  Surgeon: Lyn Records, MD;  Location: [facility] INVASIVE CV LAB;  Service: Cardiovascular;  Laterality: N/A;   TEE WITHOUT CARDIOVERSION N/A 05/08/2022   Procedure: TRANSESOPHAGEAL ECHOCARDIOGRAM (TEE);  Surgeon: Loreli Slot, MD;  Location: [facility] OR;  Service: Open Heart Surgery;   Laterality: N/A;   TOTAL KNEE ARTHROPLASTY Left 07/23/2015   Procedure: LEFT TOTAL KNEE ARTHROPLASTY;  Surgeon: Ollen Gross, MD;  Location: [facility] ORS;  Service: Orthopedics;  Laterality: Left;   TOTAL KNEE ARTHROPLASTY Right 06/11/2021   Procedure: TOTAL KNEE ARTHROPLASTY;  Surgeon: Kennedy Bucker, MD;  Location: [facility] ORS;  Service: Orthopedics;  Laterality: Right;   TRANSURETHRAL RESECTION OF PROSTATE  ?2015   URETEROSCOPY WITH HOLMIUM LASER LITHOTRIPSY       A IV Location/Drains/Wounds Patient Lines/Drains/Airways Status     Active Line/Drains/Airways     Name Placement date Placement time Site Days   Peripheral IV 03/16/23 20 G Right Antecubital 03/16/23  1444  Antecubital  less than 1   Negative Pressure Wound Therapy Knee Anterior;Right 06/11/21  0913  --  643   Wound / Incision (Open or Dehisced) 05/23/22 (MARSI) Medical Adhesive-Related Skin Injury Back Right 05/23/22  0900  Back  297            Intake/Output Last 24 hours No intake or output data in the 24 hours ending 03/16/23 1938  Labs/Imaging Results for orders placed or performed during the hospital encounter of 03/16/23 (from the past 48 hour(s))  Basic metabolic panel     Status: Abnormal   Collection Time: 03/16/23  8:42 AM  Result Value Ref Range   Sodium 143 135 - 145 mmol/L   Potassium 4.4 3.5 - 5.1 mmol/L   Chloride 105 98 - 111 mmol/L   CO2 24 22 - 32 mmol/L   Glucose, Bld 160 (H) 70 - 99 mg/dL    Comment: Glucose reference range applies only to samples taken after fasting for at least 8 hours.   BUN 19 8 - 23 mg/dL   Creatinine, Ser 1.61 (H) 0.61 - 1.24 mg/dL   Calcium 9.4 8.9 - 09.6 mg/dL   GFR, Estimated 43 (L) >60 mL/min    Comment: (NOTE) Calculated using the CKD-EPI Creatinine Equation (2021)    Anion gap 14 5 - 15    Comment: Performed at [facility] Lab, [address], [place], [place] [address]  CBC     Status: None   Collection Time: 03/16/23  8:42 AM  Result Value Ref Range   WBC  5.4 4.0 - 10.5 K/uL   RBC 5.16 4.22 - 5.81 MIL/uL   Hemoglobin 15.6 13.0 - 17.0 g/dL   HCT 98.1 19.1 - 47.8 %   MCV 96.3 80.0 - 100.0 fL   MCH 30.2 26.0 - 34.0 pg   MCHC 31.4 30.0 - 36.0 g/dL   RDW 29.5 62.1 - 30.8 %   Platelets 166 150 - 400 K/uL   nRBC 0.0 0.0 - 0.2 %    Comment: Performed at [facility], [address] [place], [place] [address]  Brain natriuretic peptide     Status: Abnormal   Collection Time: 03/16/23  8:42 AM  Result Value Ref Range   B Natriuretic Peptide 1,998.7 (H) 0.0 - 100.0 pg/mL    Comment: Performed at [facility] Lab, [address] [place], [place] [address]  Troponin I (High Sensitivity)     Status: None   Collection Time: 03/16/23  8:42 AM  Result Value Ref Range   Troponin I (High Sensitivity) 10 <18 ng/L    Comment: (NOTE) Elevated high sensitivity troponin I (hsTnI) values and significant  changes across serial measurements may suggest ACS but many other  chronic and acute conditions are known to elevate hsTnI results.  Refer to the "Links" section for chest pain algorithms and additional  guidance. Performed at [facility], [address], [place], [place] [address]   Resp panel by RT-PCR (RSV, Flu A&B, Covid) Anterior Nasal Swab     Status: None   Collection Time: 03/16/23 12:19 PM   Specimen: Anterior Nasal Swab  Result Value Ref Range   SARS Coronavirus 2 by RT PCR NEGATIVE NEGATIVE   Influenza A by PCR NEGATIVE NEGATIVE   Influenza B by PCR NEGATIVE NEGATIVE    Comment: (NOTE) The Xpert Xpress SARS-CoV-2/FLU/RSV plus assay is intended as an aid in the diagnosis of influenza from Nasopharyngeal swab specimens and should not be used as a sole basis for treatment. Nasal washings and aspirates are unacceptable for Xpert Xpress SARS-CoV-2/FLU/RSV testing.  Fact Sheet for Patients: BloggerCourse.com  Fact Sheet for Healthcare Providers: SeriousBroker.it  This test  is not yet approved or cleared by the [place] FDA and has been authorized for detection and/or diagnosis of SARS-CoV-2 by FDA under an Emergency Use Authorization (EUA). This EUA will remain in effect (meaning this test can be used) for the duration of the COVID-19 declaration under Section 564(b)(1) of the Act, 21 U.S.C. section 360bbb-3(b)(1), unless the authorization is terminated or revoked.     Resp Syncytial Virus by PCR NEGATIVE NEGATIVE    Comment: (NOTE) Fact Sheet for Patients: BloggerCourse.com  Fact Sheet for Healthcare Providers: SeriousBroker.it  This test is not yet approved or cleared by the [place] FDA and has been authorized for detection and/or diagnosis of SARS-CoV-2 by FDA under an Emergency Use Authorization (EUA). This EUA will remain in effect (meaning this test can be used) for the duration of the COVID-19 declaration under Section 564(b)(1) of the Act, 21 U.S.C. section 360bbb-3(b)(1), unless the authorization is terminated or revoked.  Performed at [facility], [address] [place], [place] [address]   Troponin I (High Sensitivity)     Status: None   Collection Time: 03/16/23  4:25 PM  Result Value Ref Range   Troponin I (High Sensitivity) 9 <18 ng/L    Comment: (NOTE) Elevated high sensitivity troponin I (hsTnI) values and significant  changes across serial measurements may suggest ACS but many other  chronic and acute conditions are known to elevate hsTnI results.  Refer to the "Links" section for chest pain algorithms and additional  guidance. Performed at [facility], [address] [place], [place] [address]   Hemoglobin A1c     Status: Abnormal   Collection Time: 03/16/23  4:25 PM  Result Value Ref Range   Hgb A1c MFr Bld 6.3 (H) 4.8 - 5.6 %    Comment: (NOTE) Pre diabetes:          5.7%-6.4%  Diabetes:              >6.4%  Glycemic control for    <7.0% adults with diabetes    Mean Plasma Glucose 134.11 mg/dL    Comment: Performed at [facility], [address] [place], [place] [address]  CBG monitoring, ED     Status: None   Collection Time: 03/16/23  5:12 PM  Result Value  Ref Range   Glucose-Capillary 95 70 - 99 mg/dL    Comment: Glucose reference range applies only to samples taken after fasting for at least 8 hours.   DG Chest 2 View  Result Date: 03/16/2023 CLINICAL DATA:  Shortness of breath EXAM: CHEST - 2 VIEW COMPARISON:  06/17/2022 FINDINGS: Heart size within normal limits. Prior sternotomy and CABG. Increased perihilar and bibasilar interstitial markings. Bibasilar atelectasis. No pleural effusion or pneumothorax. IMPRESSION: Increased perihilar and bibasilar interstitial markings, which may reflect edema versus atypical/viral infection. Electronically Signed   By: Duanne Guess D.O.   On: 03/16/2023 09:09    Pending Labs Unresulted Labs (From admission, onward)     Start     Ordered   03/17/23 0500  Basic metabolic panel  Tomorrow morning,   R        03/16/23 1553   03/17/23 0500  CBC  Tomorrow morning,   R        03/16/23 1553            Vitals/Pain Today's Vitals   03/16/23 1630 03/16/23 1700 03/16/23 1710 03/16/23 1730  BP:   (!) 149/72 (!) 156/67  Pulse: (!) 36 (!) 36 (!) 59 60  Resp: 17 (!) 22 14 (!) 21  Temp:      TempSrc:      SpO2: 98% 99% 100% 96%  Weight:      Height:      PainSc:        Isolation Precautions No active isolations  Medications Medications  insulin aspart (novoLOG) injection 0-15 Units ( Subcutaneous Not Given 03/16/23 1716)  insulin aspart (novoLOG) injection 0-5 Units (has no administration in time range)  sodium chloride flush (NS) 0.9 % injection 3 mL (has no administration in time range)  acetaminophen (TYLENOL) tablet 650 mg (has no administration in time range)    Or  acetaminophen (TYLENOL) suppository 650 mg (has no administration in time range)   traZODone (DESYREL) tablet 25 mg (has no administration in time range)  docusate sodium (COLACE) capsule 100 mg (has no administration in time range)  polyethylene glycol (MIRALAX / GLYCOLAX) packet 17 g (has no administration in time range)  bisacodyl (DULCOLAX) EC tablet 5 mg (has no administration in time range)  hydrALAZINE (APRESOLINE) injection 5 mg (has no administration in time range)  apixaban (ELIQUIS) tablet 5 mg (has no administration in time range)  atorvastatin (LIPITOR) tablet 80 mg (has no administration in time range)  clopidogrel (PLAVIX) tablet 75 mg (has no administration in time range)  sacubitril-valsartan (ENTRESTO) 24-26 mg per tablet (has no administration in time range)  amiodarone (PACERONE) tablet 200 mg (has no administration in time range)  furosemide (LASIX) injection 40 mg (has no administration in time range)  metoprolol succinate (TOPROL-XL) 24 hr tablet 25 mg (has no administration in time range)  empagliflozin (JARDIANCE) tablet 10 mg (has no administration in time range)  furosemide (LASIX) injection 20 mg (20 mg Intravenous Given 03/16/23 1511)    Mobility walks     Focused Assessments Cardiac Assessment Handoff:    Lab Results  Component Value Date   CKTOTAL 120 02/22/2017   TROPONINI <0.03 02/22/2017   No results found for: "DDIMER" Does the Patient currently have chest pain? No   , Pulmonary Assessment Handoff:  Lung sounds:   O2 Device: Room Air      R Recommendations: See Admitting Provider Note  Report given to:   Additional Notes: Patient here with chf exacerbation elevated  BNP diabetic a/o x 4 vs wnl ambulatory

## 2023-03-16 NOTE — ED Notes (Signed)
Got patient into a gown on the monitor patient is resting with call bell in reach and family at bedside ?

## 2023-03-16 NOTE — ED Provider Notes (Signed)
[place] EMERGENCY DEPARTMENT AT [facility] Provider Note   CSN: [id number] Arrival date & time: 03/16/23  7425     History  Chief Complaint  Patient presents with   Shortness of Breath    [name] Goodman is a 82 y.o. male with medical history of arthritis, kidney stones, hypertension, CHF, chronic A-fib anticoagulated, SP CABG x 5, acute PE.  Patient presents to ED for evaluation of shortness of breath.  Patient reports over the last 1 week he has had increased shortness of breath beyond his baseline.  He states he has been sleeping in a recliner secondary to shortness of breath, states that it is worse at night when he tries to lean back.  Patient reports he has been taking 20 to 40 mg of Lasix for the last 3 days secondary to feeling volume overloaded.  He is currently a patient of Dr. Gala Romney.  Recent ejection fraction shown to be 25 to 30%.  He denies any chest pain, leg swelling, lightheadedness, dizziness, nausea, vomiting, fevers.  He does endorse cough but states that it is not abnormal from his typical cough.  States he is compliant on medications to include anticoagulation and blood pressure medication.   Shortness of Breath Associated symptoms: no chest pain and no fever        Home Medications Prior to Admission medications   Medication Sig Start Date End Date Taking? Authorizing Provider  amiodarone (PACERONE) 100 MG tablet Take 1 tablet (100 mg total) by mouth daily. 02/02/23   Bensimhon, Bevelyn Buckles, MD  apixaban (ELIQUIS) 5 MG TABS tablet Take 1 tablet (5 mg total) by mouth 2 (two) times daily. 07/07/22   Milford, Anderson Malta, FNP  atorvastatin (LIPITOR) 80 MG tablet Take 1 tablet (80 mg total) by mouth daily. 07/07/22 03/04/23  Jacklynn Ganong, FNP  clopidogrel (PLAVIX) 75 MG tablet Take 1 tablet (75 mg total) by mouth daily. 07/07/22   Milford, Anderson Malta, FNP  cyanocobalamin (VITAMIN B12) 1000 MCG tablet Take 1,000 mcg by mouth daily.    [provider]  empagliflozin (JARDIANCE) 10 MG TABS tablet Take 1 tablet (10 mg total) by mouth daily. 07/07/22   Milford, Anderson Malta, FNP  furosemide (LASIX) 20 MG tablet Take 20 mg by mouth as needed for edema or fluid.    [provider]  metoprolol succinate (TOPROL-XL) 50 MG 24 hr tablet Take 1 tablet (50 mg total) by mouth daily. Take with or immediately following a meal. 02/02/23   Bensimhon, Bevelyn Buckles, MD  sacubitril-valsartan (ENTRESTO) 24-26 MG Take 1 tablet by mouth 2 (two) times daily. 07/08/22   Jacklynn Ganong, FNP      Allergies    Patient has no known allergies.    Review of Systems   Review of Systems  Constitutional:  Negative for fever.  Respiratory:  Positive for shortness of breath.   Cardiovascular:  Positive for leg swelling. Negative for chest pain.  All other systems reviewed and are negative.   Physical Exam Updated Vital Signs BP (!) 141/103   Pulse 62   Temp (!) 97.4 F (36.3 C) (Oral)   Resp (!) 21   Ht 6\' 1"  (1.854 m)   Wt 93.9 kg   SpO2 99%   BMI 27.31 kg/m  Physical Exam Vitals and nursing note reviewed.  Constitutional:      General: He is not in acute distress.    Appearance: Normal appearance. He is not ill-appearing, toxic-appearing or diaphoretic.  HENT:     Head: Normocephalic and atraumatic.     Nose: Nose normal.     Mouth/Throat:     Mouth: Mucous membranes are moist.     Pharynx: Oropharynx is clear.  Eyes:     Extraocular Movements: Extraocular movements intact.     Conjunctiva/sclera: Conjunctivae normal.     Pupils: Pupils are equal, round, and reactive to light.  Cardiovascular:     Rate and Rhythm: Normal rate and regular rhythm.  Pulmonary:     Effort: Pulmonary effort is normal.     Breath sounds: Normal breath sounds. No wheezing.  Abdominal:     General: Abdomen is flat. Bowel sounds are normal.     Palpations: Abdomen is soft.     Tenderness: There is no abdominal tenderness.  Musculoskeletal:      Cervical back: Normal range of motion and neck supple. No tenderness.  Skin:    General: Skin is warm and dry.     Capillary Refill: Capillary refill takes less than 2 seconds.  Neurological:     Mental Status: He is alert and oriented to person, place, and time.     ED Results / Procedures / Treatments   Labs (all labs ordered are listed, but only abnormal results are displayed) Labs Reviewed  BASIC METABOLIC PANEL - Abnormal; Notable for the following components:      Result Value   Glucose, Bld 160 (*)    Creatinine, Ser 1.60 (*)    GFR, Estimated 43 (*)    All other components within normal limits  BRAIN NATRIURETIC PEPTIDE - Abnormal; Notable for the following components:   B Natriuretic Peptide 1,998.7 (*)    All other components within normal limits  RESP PANEL BY RT-PCR (RSV, FLU A&B, COVID)  [id number]  CBC  TROPONIN I (HIGH SENSITIVITY)  TROPONIN I (HIGH SENSITIVITY)    EKG   Radiology DG Chest 2 View  Result Date: 03/16/2023 CLINICAL DATA:  Shortness of breath EXAM: CHEST - 2 VIEW COMPARISON:  06/17/2022 FINDINGS: Heart size within normal limits. Prior sternotomy and CABG. Increased perihilar and bibasilar interstitial markings. Bibasilar atelectasis. No pleural effusion or pneumothorax. IMPRESSION: Increased perihilar and bibasilar interstitial markings, which may reflect edema versus atypical/viral infection. Electronically Signed   By: Duanne Guess D.O.   On: 03/16/2023 09:09    Procedures Procedures   Medications Ordered in ED Medications  furosemide (LASIX) injection 20 mg (20 mg Intravenous Given 03/16/23 1511)    ED Course/ Medical Decision Making/ A&P  Medical Decision Making Amount and/or Complexity of Data Reviewed Labs: ordered. Radiology: ordered.  Risk Prescription drug management. Decision regarding hospitalization.   83 year old presents to the ED for evaluation.  Please see HPI for further details.  On examination patient afebrile,  nontachycardic.  Lung sounds are clear bilaterally, not hypoxic.  Abdomen soft and compressible throughout.  Minimal edema to bilateral lower extremities.  Overall nontoxic in appearance.  CBC with leukocytosis or anemia.  BMP with creatinine baseline, no anion gap elevation.  No electrolyte derangement.  Viral panel negative for all.  Troponin 10, delta troponin pending.  Chest x-ray shows pulmonary edema.  BNP elevated 1998  At this time the patient will need admission for IV diuresis.  Patient case discussed with Dr. Kevan Ny who has agreed to admit patient.  Patient amenable to plan.  Stable at time of admission.   Final Clinical Impression(s) / ED Diagnoses Final diagnoses:  Acute on chronic congestive heart failure, unspecified heart  failure type [facility])    Rx / DC Orders ED Discharge Orders     None         Al Decant, PA-C 03/16/23 1513    Virgina Norfolk, DO 03/16/23 1554

## 2023-03-16 NOTE — ED Notes (Signed)
cardiology provider at bedside

## 2023-03-16 NOTE — ED Notes (Signed)
ED Provider at bedside. 

## 2023-03-16 NOTE — ED Notes (Signed)
Lab notified of add-on Trop and BNP again.

## 2023-03-16 NOTE — H&P (Signed)
History and Physical    Patient: [name] Goodman ZOX:[id number] DOB: October 05, 1940 DOA: 03/16/2023 DOS: the patient was seen and examined on 03/16/2023 PCP: [name], MD  Patient coming from: Home - lives alone, girlfriend visits periodically and children live nearby; [place]: Girlfriend, [phone]   Chief Complaint: SOB  HPI: [name] Goodman is a 82 y.o. male with medical history significant of HTN, HLD, afib on AC, chronic systolic CHF, and CAD s/p CABG presenting with SOB. He reports that he thought it was similar to his heart bypass, it got so he couldn't breathe.  SOB is mild at rest.  He generally walks 2 miles BID, was having to stop in the midst of the walk to be able to breathe.  Bending over to feed the cats would also make him SOB.  +orthopnea, PND x 1 week.  Rare cough.    ER Course:  Takes Lasix prn.  1 week of orthopnea, unable to walk his usual exercise regimen.  Not frankly volume overloaded.  BNP elevated.  Negative troponin.  CXR with edema. Not hypoxic.  Needs observation for CHF exacerbation.     Review of Systems: As mentioned in the history of present illness. All other systems reviewed and are negative. Past Medical History:  Diagnosis Date   Arthritis    Atrial fibrillation (HCC)    Chronic systolic (congestive) heart failure (HCC)    History of kidney stones    Hyperlipidemia    Hypertension    Nocturia    S/P CABG (coronary artery bypass graft)    Past Surgical History:  Procedure Laterality Date   APPENDECTOMY     CARDIOVERSION N/A 09/02/2022   Procedure: CARDIOVERSION;  Surgeon: Dolores Patty, MD;  Location: [facility] ENDOSCOPY;  Service: Cardiovascular;  Laterality: N/A;   CATARACT EXTRACTION W/PHACO Right 11/27/2020   Procedure: CATARACT EXTRACTION PHACO AND INTRAOCULAR LENS PLACEMENT (IOC) RIGHT VIVITY LENS toric 6.31 00.41.8;  Surgeon: Galen Manila, MD;  Location: [facility]GERY [facility];  Service: Ophthalmology;  Laterality: Right;    CORONARY ARTERY BYPASS GRAFT N/A 05/08/2022   Procedure: CORONARY ARTERY BYPASS GRAFTING (CABG) TIMES FIVE USING ENDOSCOPICALLY HARVESTED RIGHT GREATER SAPHENOUS VEIN AND LEFT OPEN RADIAL HARVEST VEIN AND LEFT INTERNALLY MAMMARY ARTERY.;  Surgeon: Loreli Slot, MD;  Location: [facility] OR;  Service: Open Heart Surgery;  Laterality: N/A;   CORONARY PRESSURE/FFR STUDY N/A 05/27/2022   Procedure: INTRAVASCULAR PRESSURE WIRE/FFR STUDY;  Surgeon: Lyn Records, MD;  Location: [facility] INVASIVE CV LAB;  Service: Cardiovascular;  Laterality: N/A;   CORONARY STENT INTERVENTION N/A 05/27/2022   Procedure: CORONARY STENT INTERVENTION;  Surgeon: Lyn Records, MD;  Location: [facility] INVASIVE CV LAB;  Service: Cardiovascular;  Laterality: N/A;   IR THORACENTESIS ASP PLEURAL SPACE W/IMG GUIDE  05/19/2022   JOINT REPLACEMENT     KNEE ARTHROSCOPY Left    KNEE ARTHROSCOPY WITH LATERAL MENISECTOMY Right 11/13/2020   Procedure: Right knee arthroscopy, partial medial and lateral meniscectomy;  Surgeon: Kennedy Bucker, MD;  Location: [facility] ORS;  Service: Orthopedics;  Laterality: Right;   LEFT HEART CATH AND CORONARY ANGIOGRAPHY N/A 04/16/2022   Procedure: LEFT HEART CATH AND CORONARY ANGIOGRAPHY;  Surgeon: Marcina Millard, MD;  Location: [facility] INVASIVE CV LAB;  Service: Cardiovascular;  Laterality: N/A;   POLYPECTOMY     WITH COLONOSCOPY   RADIAL ARTERY HARVEST Left 05/08/2022   Procedure: RADIAL ARTERY HARVEST;  Surgeon: Loreli Slot, MD;  Location: [facility] OR;  Service: Open Heart Surgery;  Laterality: Left;  RIGHT/LEFT HEART CATH AND CORONARY/GRAFT ANGIOGRAPHY N/A 05/23/2022   Procedure: RIGHT/LEFT HEART CATH AND CORONARY/GRAFT ANGIOGRAPHY;  Surgeon: Lyn Records, MD;  Location: [facility] INVASIVE CV LAB;  Service: Cardiovascular;  Laterality: N/A;   TEE WITHOUT CARDIOVERSION N/A 05/08/2022   Procedure: TRANSESOPHAGEAL ECHOCARDIOGRAM (TEE);  Surgeon: Loreli Slot, MD;  Location: [facility] OR;  Service: Open Heart Surgery;   Laterality: N/A;   TOTAL KNEE ARTHROPLASTY Left 07/23/2015   Procedure: LEFT TOTAL KNEE ARTHROPLASTY;  Surgeon: Ollen Gross, MD;  Location: [facility] ORS;  Service: Orthopedics;  Laterality: Left;   TOTAL KNEE ARTHROPLASTY Right 06/11/2021   Procedure: TOTAL KNEE ARTHROPLASTY;  Surgeon: Kennedy Bucker, MD;  Location: [facility] ORS;  Service: Orthopedics;  Laterality: Right;   TRANSURETHRAL RESECTION OF PROSTATE  ?2015   URETEROSCOPY WITH HOLMIUM LASER LITHOTRIPSY     Social History:  reports that he quit smoking about 47 years ago. His smoking use included cigarettes. He has a 15.00 pack-year smoking history. He has been exposed to tobacco smoke. He has never used smokeless tobacco. He reports that he does not drink alcohol and does not use drugs.  No Known Allergies  Family History  Problem Relation Age of Onset   Hypertension Father    Cancer Father     Prior to Admission medications   Medication Sig Start Date End Date Taking? Authorizing Provider  amiodarone (PACERONE) 100 MG tablet Take 1 tablet (100 mg total) by mouth daily. 02/02/23   Bensimhon, Bevelyn Buckles, MD  apixaban (ELIQUIS) 5 MG TABS tablet Take 1 tablet (5 mg total) by mouth 2 (two) times daily. 07/07/22   Milford, Anderson Malta, FNP  atorvastatin (LIPITOR) 80 MG tablet Take 1 tablet (80 mg total) by mouth daily. 07/07/22 03/04/23  Jacklynn Ganong, FNP  clopidogrel (PLAVIX) 75 MG tablet Take 1 tablet (75 mg total) by mouth daily. 07/07/22   Milford, Anderson Malta, FNP  cyanocobalamin (VITAMIN B12) 1000 MCG tablet Take 1,000 mcg by mouth daily.    [provider]  empagliflozin (JARDIANCE) 10 MG TABS tablet Take 1 tablet (10 mg total) by mouth daily. 07/07/22   Milford, Anderson Malta, FNP  furosemide (LASIX) 20 MG tablet Take 20 mg by mouth as needed for edema or fluid.    [provider]  metoprolol succinate (TOPROL-XL) 50 MG 24 hr tablet Take 1 tablet (50 mg total) by mouth daily. Take with or immediately following a meal.  02/02/23   Bensimhon, Bevelyn Buckles, MD  sacubitril-valsartan (ENTRESTO) 24-26 MG Take 1 tablet by mouth 2 (two) times daily. 07/08/22   Jacklynn Ganong, FNP    Physical Exam: Vitals:   03/16/23 1330 03/16/23 1400 03/16/23 1430 03/16/23 1500  BP: (!) 143/85 (!) 155/82 (!) 141/103   Pulse: (!) 56 (!) 40 (!) 59 62  Resp: 18 20 14  (!) 21  Temp:      TempSrc:      SpO2: 100% 100% 100% 99%  Weight:      Height:       General:  Appears calm and comfortable and is in NAD, on RA Eyes:  PERRL, EOMI, normal lids, iris ENT:  grossly normal hearing, lips & tongue, mmm Neck:  no LAD, masses or thyromegaly Cardiovascular:  RRR, no m/r/g. 1-2+ LE edema.  Respiratory:   CTA bilaterally with no wheezes/rales/rhonchi.  Normal respiratory effort. Abdomen:  soft, NT, ND Skin:  no rash or induration seen on limited exam Musculoskeletal:  grossly normal tone BUE/BLE, good ROM, no bony abnormality  Psychiatric:  grossly normal mood and affect, speech fluent and appropriate, AOx3 Neurologic:  CN 2-12 grossly intact, moves all extremities in coordinated fashion   Radiological Exams on Admission: Independently reviewed - see discussion in A/P where applicable  DG Chest 2 View  Result Date: 03/16/2023 CLINICAL DATA:  Shortness of breath EXAM: CHEST - 2 VIEW COMPARISON:  06/17/2022 FINDINGS: Heart size within normal limits. Prior sternotomy and CABG. Increased perihilar and bibasilar interstitial markings. Bibasilar atelectasis. No pleural effusion or pneumothorax. IMPRESSION: Increased perihilar and bibasilar interstitial markings, which may reflect edema versus atypical/viral infection. Electronically Signed   By: Duanne Guess D.O.   On: 03/16/2023 09:09    EKG: Independently reviewed.  NSR with rate 63; prolonged QTc 532; RBBB, bifascicular block; nonspecific ST changes with no evidence of acute ischemia   Labs on Admission: I have personally reviewed the available labs and imaging studies at the time  of the admission.  Pertinent labs:    Glucose 160 BUN 19/Creatinine 1.6/GFR 43 - stable BNP 1998.7; 1454.9 on 08/27/22 HS troponin 10 Normal CBC Normal TSH on 5/13 COVID/flu/RSV negative   Assessment and Plan: Principal Problem:   Acute on chronic systolic (congestive) heart failure (HCC) Active Problems:   Atrial fibrillation, chronic (HCC)   Dyslipidemia   Essential hypertension   S/P CABG x 5   Coronary artery disease involving native coronary artery of native heart   Pre-diabetes    Acute on chronic systolic CHF -Patient with known h/o chronic systolic CHF (08/2022 echo with EF 25-30%) presenting with worsening SOB and orthopnea, mild edema -CXR consistent with mild pulmonary edema -Elevated BNP compared to prior -With elevated BNP and abnl CXR, acute decompensated CHF seems probable as diagnosis -Will place in observation status with telemetry, as there are no current findings necessitating admission (hemodynamic instability, severe electrolyte abnormalities, cardiac arrhythmias, ACS, severe pulmonary edema requiring new O2 therapy, AMS) -Will request echocardiogram -CHF order set utilized -Cardiology consulted -Was given Lasix 20 mg x 1 in ER and will repeat with 20 mg IV BID -Continue D[place] O2 for now -Continue Entresto, Plavix  HTN -Continue Toprol XL -Will also add prn hydralazine  HLD -Continue Lipitor  Pre-DM -Last A1c was 6.3 in 2023; will repeat -There is no indication to start medication at this time -Will cover with SSI for now  CAD -s/p CABG -No current chest pain -Continue Plavix  Afib -Rate controlled with Toprol XL -Continue Eliquis     Advance Care Planning:   Code Status: Full Code - Code status was discussed with the patient and/or family at the time of admission.  The patient would want to receive full resuscitative measures at this time.   Consults: Cardiology; CHF navigator; TOC team; nutrition  DVT Prophylaxis: Eliquis  Family  Communication: Girlfriend was present throughout evaluation  Severity of Illness: The appropriate patient status for this patient is OBSERVATION. Observation status is judged to be reasonable and necessary in order to provide the required intensity of service to ensure the patient's safety. The patient's presenting symptoms, physical exam findings, and initial radiographic and laboratory data in the context of their medical condition is felt to place them at decreased risk for further clinical deterioration. Furthermore, it is anticipated that the patient will be medically stable for discharge from the hospital within 2 midnights of admission.   Author: Jonah Blue, MD 03/16/2023 4:02 PM  For on call review [email or web address].

## 2023-03-16 NOTE — ED Notes (Signed)
Lab made aware of add-on labs

## 2023-03-16 NOTE — Consult Note (Addendum)
Advanced Heart Failure Team Consult Note   Primary Physician: [name], MD PCP-Cardiologist:  Orbie Pyo, MD  Reason for Consultation: acute on chronic systolic heart failure  HPI:    [name] Goodman is seen today for evaluation of acute on chronic systolic heart failure at the request of Dr. Ophelia Charter, emergency medicine.   [name] Goodman is an 82 y.o.male with CAD s/p CABG 08/23, HFmEF, atrial fibrillation, HTN.    Underwent CABG X 5 in 8/23. Intra-op TEE with LVEF 40-45%. Had postoperative AF and developed bradycardia on amiodarone drip. Converted to SR.    Readmitted on 05/18/22 with segmental and subsegmental PE RML/RUL, CHF, left pleural effusion s/p thoracentesis and recurrent atrial fibrillation. Echo EF 25-30%, mildly RV HK. Converted to SR during admit. PVCs also noted on tele. Had early graft failure with occlusion of SVG to Diag and SVG to RCA occluded. LIMA to LAD and radial jump graft to OM and RPLV patent. Underwent successful staged PCI/DES to distal RCA and PCI/DES p Cx into OM1.   Presented to the ED today with SOB with activity, bendopnea and orthopnea. Reports that over the last 2 weeks he has been getting progressively more SOB. He usually walks 2 miles in the morning and 2 miles in the evenings without issues but recently around the 1 mile mark he has to take a break and catch his breath. He has been able to play golf on the weekends with breaks. Appetite fine, reports no significant weight gain noticed. Did start taking 20-40mg  PRN lasix over the last 3 days with no relief. Reports compliance with all meds. Labs reviewed: SCr 1.6 (baseline 1.3-1.5), BNP 2K, K 4.4, HsTrop 10, resp panel (-). CXR with increased perihilar and bibasilar interstitial markings, bibasilar atelectasis. Has started diuresing with 20 IV lasix.   Patient resting comfortably in stretcher. In no acute distress.   Cardiac studies reviewed:  Zio 9/23 showed AF/AFL burden 84% with PVC  burden of 18.6%. Started on amiodarone.  Zio 11/23 Continuous AFL (avg 76 bpm) PVC 10.7% (2 morphologies 6.0% & 4.7%)  Echo 08/27/22 EF 25-30% mod-sev MR. Had DC-CV 09/02/22  cMRI 3/24: EF 43% RVEF 40% Mod MR. Coronary pattern LGE in the mid inferoseptal wall (>50% wall thickness, unlikely to be viable) and the basal inferolateral wall (<50% wall thickness, likely viable).  Home Medications Prior to Admission medications   Medication Sig Start Date End Date Taking? Authorizing Provider  amiodarone (PACERONE) 100 MG tablet Take 1 tablet (100 mg total) by mouth daily. 02/02/23   Marqus Macphee, Bevelyn Buckles, MD  apixaban (ELIQUIS) 5 MG TABS tablet Take 1 tablet (5 mg total) by mouth 2 (two) times daily. 07/07/22   Milford, Anderson Malta, FNP  atorvastatin (LIPITOR) 80 MG tablet Take 1 tablet (80 mg total) by mouth daily. 07/07/22 03/04/23  Jacklynn Ganong, FNP  clopidogrel (PLAVIX) 75 MG tablet Take 1 tablet (75 mg total) by mouth daily. 07/07/22   Milford, Anderson Malta, FNP  cyanocobalamin (VITAMIN B12) 1000 MCG tablet Take 1,000 mcg by mouth daily.    [provider]  empagliflozin (JARDIANCE) 10 MG TABS tablet Take 1 tablet (10 mg total) by mouth daily. 07/07/22   Milford, Anderson Malta, FNP  furosemide (LASIX) 20 MG tablet Take 20 mg by mouth as needed for edema or fluid.    [provider]  metoprolol succinate (TOPROL-XL) 50 MG 24 hr tablet Take 1 tablet (50 mg total) by mouth daily. Take with or immediately  following a meal. 02/02/23   Lynzi Meulemans, Bevelyn Buckles, MD  sacubitril-valsartan (ENTRESTO) 24-26 MG Take 1 tablet by mouth 2 (two) times daily. 07/08/22   Jacklynn Ganong, FNP    Past Medical History: Past Medical History:  Diagnosis Date   Arthritis    Atrial fibrillation (HCC)    Chronic systolic (congestive) heart failure (HCC)    History of kidney stones    Hyperlipidemia    Hypertension    Nocturia    S/P CABG (coronary artery bypass graft)     Past Surgical History: Past  Surgical History:  Procedure Laterality Date   APPENDECTOMY     CARDIOVERSION N/A 09/02/2022   Procedure: CARDIOVERSION;  Surgeon: Dolores Patty, MD;  Location: [facility] ENDOSCOPY;  Service: Cardiovascular;  Laterality: N/A;   CATARACT EXTRACTION W/PHACO Right 11/27/2020   Procedure: CATARACT EXTRACTION PHACO AND INTRAOCULAR LENS PLACEMENT (IOC) RIGHT VIVITY LENS toric 6.31 00.41.8;  Surgeon: Galen Manila, MD;  Location: [facility];  Service: Ophthalmology;  Laterality: Right;   CORONARY ARTERY BYPASS GRAFT N/A 05/08/2022   Procedure: CORONARY ARTERY BYPASS GRAFTING (CABG) TIMES FIVE USING ENDOSCOPICALLY HARVESTED RIGHT GREATER SAPHENOUS VEIN AND LEFT OPEN RADIAL HARVEST VEIN AND LEFT INTERNALLY MAMMARY ARTERY.;  Surgeon: Loreli Slot, MD;  Location: [facility] OR;  Service: Open Heart Surgery;  Laterality: N/A;   CORONARY PRESSURE/FFR STUDY N/A 05/27/2022   Procedure: INTRAVASCULAR PRESSURE WIRE/FFR STUDY;  Surgeon: Lyn Records, MD;  Location: [facility] INVASIVE CV LAB;  Service: Cardiovascular;  Laterality: N/A;   CORONARY STENT INTERVENTION N/A 05/27/2022   Procedure: CORONARY STENT INTERVENTION;  Surgeon: Lyn Records, MD;  Location: [facility] INVASIVE CV LAB;  Service: Cardiovascular;  Laterality: N/A;   IR THORACENTESIS ASP PLEURAL SPACE W/IMG GUIDE  05/19/2022   JOINT REPLACEMENT     KNEE ARTHROSCOPY Left    KNEE ARTHROSCOPY WITH LATERAL MENISECTOMY Right 11/13/2020   Procedure: Right knee arthroscopy, partial medial and lateral meniscectomy;  Surgeon: Kennedy Bucker, MD;  Location: [facility] ORS;  Service: Orthopedics;  Laterality: Right;   LEFT HEART CATH AND CORONARY ANGIOGRAPHY N/A 04/16/2022   Procedure: LEFT HEART CATH AND CORONARY ANGIOGRAPHY;  Surgeon: Marcina Millard, MD;  Location: [facility] INVASIVE CV LAB;  Service: Cardiovascular;  Laterality: N/A;   POLYPECTOMY     WITH COLONOSCOPY   RADIAL ARTERY HARVEST Left 05/08/2022   Procedure: RADIAL ARTERY HARVEST;  Surgeon: Loreli Slot, MD;  Location[facility];  Service: Open Heart Surgery;  Laterality: Left;   RIGHT/LEFT HEART CATH AND CORONARY/GRAFT ANGIOGRAPHY N/A 05/23/2022   Procedure: RIGHT/LEFT HEART CATH AND CORONARY/GRAFT ANGIOGRAPHY;  Surgeon: Lyn Records, MD;  Location: [facility] INVASIVE CV LAB;  Service: Cardiovascular;  Laterality: N/A;   TEE WITHOUT CARDIOVERSION N/A 05/08/2022   Procedure: TRANSESOPHAGEAL ECHOCARDIOGRAM (TEE);  Surgeon: Loreli Slot, MD;  Location: [facility] OR;  Service: Open Heart Surgery;  Laterality: N/A;   TOTAL KNEE ARTHROPLASTY Left 07/23/2015   Procedure: LEFT TOTAL KNEE ARTHROPLASTY;  Surgeon: Ollen Gross, MD;  Location: [facility] ORS;  Service: Orthopedics;  Laterality: Left;   TOTAL KNEE ARTHROPLASTY Right 06/11/2021   Procedure: TOTAL KNEE ARTHROPLASTY;  Surgeon: Kennedy Bucker, MD;  Location: [facility] ORS;  Service: Orthopedics;  Laterality: Right;   TRANSURETHRAL RESECTION OF PROSTATE  ?2015   URETEROSCOPY WITH HOLMIUM LASER LITHOTRIPSY     Family History: Family History  Problem Relation Age of Onset   Hypertension Father    Cancer Father    Social History: Social History   Socioeconomic History  Marital status: Divorced    Spouse name: Not on file   Number of children: 3   Years of education: Not on file   Highest education level: Master's degree (e.g., MA, MS, MEng, MEd, MSW, MBA)  Occupational History   Occupation: retired Psychologist, occupational at [employer]  Tobacco Use   Smoking status: Former    Packs/day: 1.00    Years: 15.00    Additional pack years: 0.00    Total pack years: 15.00    Types: Cigarettes    Quit date: 07/17/1975    Years since quitting: 47.6    Passive exposure: Past   Smokeless tobacco: Never  Vaping Use   Vaping Use: Never used  Substance and Sexual Activity   Alcohol use: No   Drug use: No   Sexual activity: Not on file  Other Topics Concern   Not on file  Social History Narrative   Lives alone   Social Determinants of Health   Financial Resource  Strain: Low Risk  (05/20/2022)   Overall Financial Resource Strain (CARDIA)    Difficulty of Paying Living Expenses: Not hard at all  Food Insecurity: No Food Insecurity (05/20/2022)   Hunger Vital Sign    Worried About Running Out of Food in the Last Year: Never true    Ran Out of Food in the Last Year: Never true  Transportation Needs: No Transportation Needs (05/20/2022)   PRAPARE - Administrator, Civil Service (Medical): No    Lack of Transportation (Non-Medical): No  Physical Activity: Not on file  Stress: Not on file  Social Connections: Not on file   Allergies:  No Known Allergies  Objective:    Vital Signs:   Temp:  [97.4 F (36.3 C)-98.2 F (36.8 C)] 97.4 F (36.3 C) (06/24 1307) Pulse Rate:  [40-68] 62 (06/24 1500) Resp:  [14-21] 21 (06/24 1500) BP: (141-180)/(63-103) 141/103 (06/24 1430) SpO2:  [97 %-100 %] 99 % (06/24 1500) Weight:  [93.9 kg] 93.9 kg (06/24 0834)    Weight change: Filed Weights   03/16/23 0834  Weight: 93.9 kg    Intake/Output:  No intake or output data in the 24 hours ending 03/16/23 1613   Physical Exam  General:  elderly appearing.  No respiratory difficulty HEENT: normal Neck: supple. JVD ~12 cm. Carotids 2+ bilat; no bruits. No lymphadenopathy or thyromegaly appreciated. Cor: PMI nondisplaced. Regular rate & rhythm. No rubs, gallops. 2/6 MR murmur. Lungs: clear Abdomen: soft, nontender, nondistended. No hepatosplenomegaly. No bruits or masses. Good bowel sounds. Extremities: no cyanosis, clubbing, rash, +1 BLE edema  Neuro: alert & oriented x 3, cranial nerves grossly intact. moves all 4 extremities w/o difficulty. Affect pleasant.   Telemetry   Ventricular bigeminy / NSR 60s (Personally reviewed)    EKG    NSR with 1st degree AVB 60s RBBB     Labs   Basic Metabolic Panel: Recent Labs  Lab 03/16/23 0842  NA 143  K 4.4  CL 105  CO2 24  GLUCOSE 160*  BUN 19  CREATININE 1.60*  CALCIUM 9.4    Liver  Function Tests: No results for input(s): "AST", "ALT", "ALKPHOS", "BILITOT", "PROT", "ALBUMIN" in the last 168 hours. No results for input(s): "LIPASE", "AMYLASE" in the last 168 hours. No results for input(s): "AMMONIA" in the last 168 hours.  CBC: Recent Labs  Lab 03/16/23 0842  WBC 5.4  HGB 15.6  HCT 49.7  MCV 96.3  PLT 166    Cardiac Enzymes: No results  for input(s): "CKTOTAL", "CKMB", "CKMBINDEX", "TROPONINI" in the last 168 hours.  BNP: BNP (last 3 results) Recent Labs    07/07/22 1131 08/27/22 1528 03/16/23 0842  BNP 1,338.2* 1,454.9* 1,998.7*    ProBNP (last 3 results) No results for input(s): "PROBNP" in the last 8760 hours.   CBG: No results for input(s): "GLUCAP" in the last 168 hours.  Coagulation Studies: No results for input(s): "LABPROT", "INR" in the last 72 hours.   Imaging   DG Chest 2 View  Result Date: 03/16/2023 CLINICAL DATA:  Shortness of breath EXAM: CHEST - 2 VIEW COMPARISON:  06/17/2022 FINDINGS: Heart size within normal limits. Prior sternotomy and CABG. Increased perihilar and bibasilar interstitial markings. Bibasilar atelectasis. No pleural effusion or pneumothorax. IMPRESSION: Increased perihilar and bibasilar interstitial markings, which may reflect edema versus atypical/viral infection. Electronically Signed   By: Duanne Guess D.O.   On: 03/16/2023 09:09   Medications:     Current Medications:  [START ON 03/17/2023] amiodarone  100 mg Oral Daily   apixaban  5 mg Oral BID   [START ON 03/17/2023] atorvastatin  80 mg Oral Daily   [START ON 03/17/2023] clopidogrel  75 mg Oral Daily   docusate sodium  100 mg Oral BID   furosemide  20 mg Intravenous BID   insulin aspart  0-15 Units Subcutaneous TID WC   insulin aspart  0-5 Units Subcutaneous QHS   [START ON 03/17/2023] metoprolol succinate  50 mg Oral Daily   sacubitril-valsartan  1 tablet Oral BID   sodium chloride flush  3 mL Intravenous Q12H    Infusions:   Patient Profile   [name] Goodman is an 82 y.o.male with CAD s/p CABG 08/23, HFmEF, atrial fibrillation, HTN. AHF team to see for acute on chronic systolic heart failure Assessment/Plan  Chronic systolic CHF - pre-CABG LV gram (7/23): EF 35-40%  - Intraop TEE 05/08/22: EF 40-45% - Limited echo 05/19/22: EF 25-30%, RV mildly reduced - S/p PCI/DES to RCA and PCI/DES to p Lcx into OM1 9/23 (see below).  - Echo 08/27/22 EF 25-30% moderate to severe MR. - cMRI 3/24: EF 43% RVEF 40% Mod MR. Coronary pattern LGE in the mid inferoseptal wall (>50% wall thickness, unlikely to be viable) and the basal inferolateral wall (<50% wall thickness, likely viable). - suspect primarily iCM. Had early graft failure post CABG. vs PVC  - NYHA IIIa, volume slightly elevated on exam. Will increase lasix to 40mg  BID.  - Continue Entresto 24/26 mg bid.  - Off spiro due to AKI and hyperkalemia - Continue Jardiance 10 mg daily. Hgb A1c 6.3 today - Decrease Toprol 50>25 mg daily with bradycardia, acute exacerbation and increased amio - update echo - strict I&O, daily weights    2. CAD - S/p CABG X 5 (LIMA to LAD, SVG to PDA, SVG to D1, sequential left radial artery to OM and PL) 05/08/22 - Early graft failure with occlusion of SVG to D1 and SVG to RCA. LIMA to LAD and radial graft to OM1 and RPLV patent - S/p PCI/DES to RCA and PCI/DES p Lcx into OM1 05/27/22. - No s/s angina - Continue Plavix and Eliquis. - Continue statin   3. Persistent atrial fibrillation/AFL  - Noted post CABG. - Had recurrence at time of recent admission. Converted to SR spontaneously.  - Zio 2 week (9/23) showed mostly AF/AFL 84% burden, frequent PVCs (18.6%) - Zio 11/23 100% AFL  - s/p DC-CV 12/23 - Zio 5/24: NSR with 1st degree AVB, rare  PACs and PVCs - Has seen Dr. Nelly Laurence and discussed possible ablation in 4/24. Has cancelled.  - In ventricular bigeminy / NSR today. Increase 100 mg daily amiodarone to 200 mg BID while IP.  - Continue Eliquis 5 mg  bid.   4. Frequent PVCs - Zio 2 week (9/23): PVCs 18.6% - Previously discussed sleep study, he was not interested - Repeat Zio 2 11/23 10.7% PVCs - Zio 5/24: NSR with 1st degree AVB, rare PACs and PVCs - remains on amio.    5. HTN - Blood pressure elevated and meds have been held. Restart Entresto.  - PRN hydral   6. CKD IIIa - Baseline Scr 1.3-1.5 - SCr 1.6 - monitor with diuresis, avoid hypotension   7. Recent PE - Post CABG - Continue Eliquis 5 mg bid. (On for AF)   8. Mitral regurgitation - moderate to severe on previosus echo - likely ischemic (vs functional) - cMRI 3/24 moderate MR - Continue to follow  Length of Stay: 0  Alen Bleacher, NP  03/16/2023, 4:13 PM  Advanced Heart Failure Team Pager [phone] (M-F; 7a - 5p)  Please contact [facility] for night-coverage after hours (4p -7a ) and weekends on [email or web address]   Patient seen and examined with the above-signed Advanced Practice Provider and/or Housestaff. I personally reviewed laboratory data, imaging studies and relevant notes. I independently examined the patient and formulated the important aspects of the plan. I have edited the note to reflect any of my changes or salient points. I have personally discussed the plan with the patient and/or family.  82 y/o male with systolic HF due to iCM. Most recent EF 43% with mod MR. Now presents with 1 weeks of increasing HF symptoms. Also having frequent PVCs.  General:  Well appearing. No resp difficulty HEENT: normal Neck: supple. JVP to jaw . Carotids 2+ bilat; no bruits. No lymphadenopathy or thryomegaly appreciated. Cor: PMI nondisplaced. Regular rate & rhythm. + PVCs No rubs, gallops or murmurs. Lungs: mild basilar crackles Abdomen: soft, nontender, nondistended. No hepatosplenomegaly. No bruits or masses. Good bowel sounds. Extremities: no cyanosis, clubbing, rash, 1+ edema Neuro: alert & orientedx3, cranial nerves grossly intact. moves all 4 extremities w/o  difficulty. Affect pleasant  Continue IV diuresis. Watch electrolytes. Update echo to reassess EF and MR. Increase amio to 200 bid. Follow PVC burden on tele.   Arvilla Meres, MD  6:35 PM

## 2023-03-16 NOTE — ED Triage Notes (Signed)
Pt. Stated, [name] Goodman been SOB for a week. Denies any other symptoms. I had this when I had my open heart surgery.

## 2023-03-16 NOTE — ED Notes (Signed)
Assumed care of patient pending admit for chf exacerbation. Patient states he has had increased sob x  1 week. Patient ambulatory a/o x 4 respirations even and non labored vs wnl. Patient has elevated BNP per lab work

## 2023-03-17 ENCOUNTER — Other Ambulatory Visit (HOSPITAL_COMMUNITY): Payer: Self-pay

## 2023-03-17 ENCOUNTER — Observation Stay (HOSPITAL_BASED_OUTPATIENT_CLINIC_OR_DEPARTMENT_OTHER): Payer: PPO

## 2023-03-17 DIAGNOSIS — I5023 Acute on chronic systolic (congestive) heart failure: Secondary | ICD-10-CM | POA: Diagnosis not present

## 2023-03-17 LAB — BASIC METABOLIC PANEL
Anion gap: 13 (ref 5–15)
BUN: 21 mg/dL (ref 8–23)
CO2: 27 mmol/L (ref 22–32)
Calcium: 9.3 mg/dL (ref 8.9–10.3)
Chloride: 103 mmol/L (ref 98–111)
Creatinine, Ser: 1.75 mg/dL — ABNORMAL HIGH (ref 0.61–1.24)
GFR, Estimated: 38 mL/min — ABNORMAL LOW (ref >60)
Glucose, Bld: 105 mg/dL — ABNORMAL HIGH (ref 70–99)
Potassium: 4.3 mmol/L (ref 3.5–5.1)
Sodium: 143 mmol/L (ref 135–145)

## 2023-03-17 LAB — CBC
HCT: 50.2 % (ref 39.0–52.0)
Hemoglobin: 16.1 g/dL (ref 13.0–17.0)
MCH: 29.8 pg (ref 26.0–34.0)
MCHC: 32.1 g/dL (ref 30.0–36.0)
MCV: 93 fL (ref 80.0–100.0)
Platelets: 155 10*3/uL (ref 150–400)
RBC: 5.4 MIL/uL (ref 4.22–5.81)
RDW: 14.3 % (ref 11.5–15.5)
WBC: 5.8 10*3/uL (ref 4.0–10.5)
nRBC: 0 % (ref 0.0–0.2)

## 2023-03-17 LAB — ECHOCARDIOGRAM COMPLETE
Calc EF: 38.6 %
Height: 73 in
MV M vel: 3.77 m/s
MV Peak grad: 56.9 mmHg
S' Lateral: 4.5 cm
Single Plane A2C EF: 41.3 %
Single Plane A4C EF: 36 %
Weight: 3312 oz

## 2023-03-17 LAB — CBG MONITORING, ED: Glucose-Capillary: 135 mg/dL — ABNORMAL HIGH (ref 70–99)

## 2023-03-17 MED ORDER — AMIODARONE HCL 200 MG PO TABS: 200.0000 mg | ORAL_TABLET | Freq: Every day | ORAL | Status: DC

## 2023-03-17 MED ORDER — FUROSEMIDE 20 MG PO TABS
ORAL_TABLET | ORAL | 0 refills | Status: DC
  Filled 2023-03-17: qty 30, fill #0

## 2023-03-17 MED ORDER — AMIODARONE HCL 200 MG PO TABS
200.0000 mg | ORAL_TABLET | Freq: Every day | ORAL | 1 refills | Status: DC
  Filled 2023-03-17: qty 30, 30d supply, fill #0

## 2023-03-17 MED ORDER — AMIODARONE HCL 200 MG PO TABS
200.0000 mg | ORAL_TABLET | Freq: Every day | ORAL | 1 refills | Status: DC
Start: 2023-03-17 — End: 2023-06-10

## 2023-03-17 MED ORDER — FUROSEMIDE 40 MG PO TABS: ORAL_TABLET | ORAL | 1 refills | Status: DC

## 2023-03-17 NOTE — Discharge Summary (Signed)
Physician Discharge Summary  [name] Goodman JYN:[id number] DOB: 05-14-1941 DOA: 03/16/2023  PCP: [name], MD  Admit date: 03/16/2023 Discharge date: 03/17/2023  Time spent: 35 minutes  Recommendations for Outpatient Follow-up:  [facility] in 1 to 2 weeks, please follow-up echo BMP in 1 week   Discharge Diagnoses:  Principal Problem:   Acute on chronic systolic (congestive) heart failure (HCC) Mitral regurgitation   Atrial fibrillation, chronic (HCC)   Dyslipidemia   Essential hypertension   S/P CABG x 5   Coronary artery disease involving native coronary artery of native heart   Pre-diabetes   Discharge Condition: Improved  Diet recommendation: Low-sodium heart healthy  Filed Weights   03/16/23 0834  Weight: 93.9 kg    History of present illness:  82 y.o. male with medical history significant of HTN, HLD, afib on AC, chronic systolic CHF, and CAD s/p CABG presenting with SOB. He reports that he thought it was similar to his heart bypass, it got so he couldn't breathe.  SOB is mild at rest.  He generally walks 2 miles BID, was having to stop in the midst of the walk to be able to breathe.  Bending over to feed the cats would also make him SOB.  +orthopnea, PND x 1 week.  Rare cough.  Hospital Course:   Acute/Chronic systolic CHF Moderate mitral regurg -Last echo 12/23 with a EF 25-30% with moderate to severe MR  -Ischemic cardiomyopathy -Admitted with volume overload, improved after few doses of IV Lasix yesterday -Heart failure team consulting, transition to oral Lasix 40 Mg on Monday Wednesday Friday, he was continued on Entresto and Jardiance -Repeat echo to be done prior to discharge home today, -He will follow-up follow-up with cardiology and review echo report, mitral regurg  CAD - S/p CABG X 5 in 8/23 -Prior PCI to RCA and left circumflex in 9/23 -Continue Plavix, statin and Eliquis   Persistent atrial fibrillation/AFL  -In sinus rhythm,  amiodarone changed to 200 Mg daily, continue Eliquis  CKD IIIa - Baseline Scr 1.3-1.5 -Creatinine slightly higher, check BMP in 1 week, continue current dose of Entresto   History of PE post CABG -Continue Eliquis for this as well as A-fib  Prediabetes -A1c was 6.3 in 2023  Discharge Exam: Vitals:   03/17/23 0900 03/17/23 1000  BP: 139/76 (!) 143/80  Pulse: (!) 35 65  Resp: 13 19  Temp:    SpO2: 97% 96%    Gen: Awake, Alert, Oriented X 3,  HEENT: no JVD Lungs: Good air movement bilaterally, CTAB CVS: S1S2/RRR Abd: soft, Non tender, non distended, BS present Extremities: trace edema Skin: no new rashes on exposed skin   Discharge Instructions   Discharge Instructions     Diet - low sodium heart healthy   Complete by: As directed    Increase activity slowly   Complete by: As directed       Allergies as of 03/17/2023   No Known Allergies      Medication List     TAKE these medications    amiodarone 200 MG tablet Commonly known as: Pacerone Take 1 tablet (200 mg total) by mouth daily. What changed:  medication strength how much to take   apixaban 5 MG Tabs tablet Commonly known as: ELIQUIS Take 1 tablet (5 mg total) by mouth 2 (two) times daily.   atorvastatin 80 MG tablet Commonly known as: LIPITOR Take 1 tablet (80 mg total) by mouth daily.   clopidogrel 75 MG tablet  Commonly known as: PLAVIX Take 1 tablet (75 mg total) by mouth daily.   cyanocobalamin 1000 MCG tablet Commonly known as: VITAMIN B12 Take 1,000 mcg by mouth daily.   empagliflozin 10 MG Tabs tablet Commonly known as: JARDIANCE Take 1 tablet (10 mg total) by mouth daily.   Entresto 24-26 MG Generic drug: sacubitril-valsartan Take 1 tablet by mouth 2 (two) times daily.   furosemide 20 MG tablet Commonly known as: LASIX Take 2 tablets i.e. 40 mg on Mondays, Wednesdays and Friday What changed:  how much to take how to take this when to take this reasons to take  this additional instructions   metoprolol succinate 50 MG 24 hr tablet Commonly known as: TOPROL-XL Take 1 tablet (50 mg total) by mouth daily. Take with or immediately following a meal.       No Known Allergies    The results of significant diagnostics from this hospitalization (including imaging, microbiology, ancillary and laboratory) are listed below for reference.    Significant Diagnostic Studies: DG Chest 2 View  Result Date: 03/16/2023 CLINICAL DATA:  Shortness of breath EXAM: CHEST - 2 VIEW COMPARISON:  06/17/2022 FINDINGS: Heart size within normal limits. Prior sternotomy and CABG. Increased perihilar and bibasilar interstitial markings. Bibasilar atelectasis. No pleural effusion or pneumothorax. IMPRESSION: Increased perihilar and bibasilar interstitial markings, which may reflect edema versus atypical/viral infection. Electronically Signed   By: Duanne Guess D.O.   On: 03/16/2023 09:09    Microbiology: Recent Results (from the past 240 hour(s))  Resp panel by RT-PCR (RSV, Flu A&B, Covid) Anterior Nasal Swab     Status: None   Collection Time: 03/16/23 12:19 PM   Specimen: Anterior Nasal Swab  Result Value Ref Range Status   SARS Coronavirus 2 by RT PCR NEGATIVE NEGATIVE Final   Influenza A by PCR NEGATIVE NEGATIVE Final   Influenza B by PCR NEGATIVE NEGATIVE Final    Comment: (NOTE) The Xpert Xpress SARS-CoV-2/FLU/RSV plus assay is intended as an aid in the diagnosis of influenza from Nasopharyngeal swab specimens and should not be used as a sole basis for treatment. Nasal washings and aspirates are unacceptable for Xpert Xpress SARS-CoV-2/FLU/RSV testing.  Fact Sheet for Patients: BloggerCourse.com  Fact Sheet for Healthcare Providers: SeriousBroker.it  This test is not yet approved or cleared by the [place] FDA and has been authorized for detection and/or diagnosis of SARS-CoV-2 by FDA under an  Emergency Use Authorization (EUA). This EUA will remain in effect (meaning this test can be used) for the duration of the COVID-19 declaration under Section 564(b)(1) of the Act, 21 U.S.C. section 360bbb-3(b)(1), unless the authorization is terminated or revoked.     Resp Syncytial Virus by PCR NEGATIVE NEGATIVE Final    Comment: (NOTE) Fact Sheet for Patients: BloggerCourse.com  Fact Sheet for Healthcare Providers: SeriousBroker.it  This test is not yet approved or cleared by the [place] FDA and has been authorized for detection and/or diagnosis of SARS-CoV-2 by FDA under an Emergency Use Authorization (EUA). This EUA will remain in effect (meaning this test can be used) for the duration of the COVID-19 declaration under Section 564(b)(1) of the Act, 21 U.S.C. section 360bbb-3(b)(1), unless the authorization is terminated or revoked.  Performed at [facility], [address], [place], [place] [address]      Labs: Basic Metabolic Panel: Recent Labs  Lab 03/16/23 0842 03/17/23 0314  NA 143 143  K 4.4 4.3  CL 105 103  CO2 24 27  GLUCOSE 160* 105*  BUN 19 21  CREATININE 1.60* 1.75*  CALCIUM 9.4 9.3   Liver Function Tests: No results for input(s): "AST", "ALT", "ALKPHOS", "BILITOT", "PROT", "ALBUMIN" in the last 168 hours. No results for input(s): "LIPASE", "AMYLASE" in the last 168 hours. No results for input(s): "AMMONIA" in the last 168 hours. CBC: Recent Labs  Lab 03/16/23 0842 03/17/23 0314  WBC 5.4 5.8  HGB 15.6 16.1  HCT 49.7 50.2  MCV 96.3 93.0  PLT 166 155   Cardiac Enzymes: No results for input(s): "CKTOTAL", "CKMB", "CKMBINDEX", "TROPONINI" in the last 168 hours. BNP: BNP (last 3 results) Recent Labs    07/07/22 1131 08/27/22 1528 03/16/23 0842  BNP 1,338.2* 1,454.9* 1,998.7*    ProBNP (last 3 results) No results for input(s): "PROBNP" in the last 8760 hours.  CBG: Recent  Labs  Lab 03/16/23 1712 03/16/23 2204 03/17/23 0816  GLUCAP 95 103* 135*       Signed:  Zannie Cove MD.  [facility] 03/17/2023, 11:12 AM

## 2023-03-17 NOTE — Progress Notes (Signed)
Heart Failure Navigator Progress Note  Assessed for Heart & Vascular TOC clinic readiness.  Patient does not meet criteria due to Advanced Heart Failure Team patient of Dr. Gala Romney at [facility]. .   Navigator will sign off at this time.   Rhae Hammock, BSN, Scientist, clinical (histocompatibility and immunogenetics) Only

## 2023-03-17 NOTE — Progress Notes (Addendum)
Advanced Heart Failure Rounding Note  PCP-Cardiologist: Orbie Pyo, MD   Subjective:   Yesterday diuresed with IV lasix. Brisk diuresis. Negative 2.5 liters. High PVC burden/bradycardia. Amio increased to 200 mg twice a day and Toprol XL cut back to 25 mg daily.   Feels better. Denies SOB.    Objective:   Weight Range: 93.9 kg Body mass index is 27.31 kg/m.   Vital Signs:   Temp:  [97.4 F (36.3 C)-97.9 F (36.6 C)] 97.9 F (36.6 C) (06/25 0737) Pulse Rate:  [34-68] 56 (06/25 0600) Resp:  [12-25] 12 (06/25 0600) BP: (130-180)/(67-103) 136/97 (06/25 0600) SpO2:  [94 %-100 %] 96 % (06/25 0600)    Weight change: Filed Weights   03/16/23 0834  Weight: 93.9 kg    Intake/Output:   Intake/Output Summary (Last 24 hours) at 03/17/2023 0920 Last data filed at 03/17/2023 0610 Gross per 24 hour  Intake --  Output 2550 ml  Net -2550 ml      Physical Exam    General: In bed. No resp difficulty HEENT: Normal Neck: Supple. JVP 9-10 . Carotids 2+ bilat; no bruits. No lymphadenopathy or thyromegaly appreciated. Cor: PMI nondisplaced. Regular rate & rhythm. No rubs, gallops or murmurs. Lungs: Clear Abdomen: Soft, nontender, nondistended. No hepatosplenomegaly. No bruits or masses. Good bowel sounds. Extremities: No cyanosis, clubbing, rash, edema Neuro: Alert & orientedx3, cranial nerves grossly intact. moves all 4 extremities w/o difficulty. Affect pleasant   Telemetry    SR  with PVCs   EKG    N/A   Labs    CBC Recent Labs    03/16/23 0842 03/17/23 0314  WBC 5.4 5.8  HGB 15.6 16.1  HCT 49.7 50.2  MCV 96.3 93.0  PLT 166 155   Basic Metabolic Panel Recent Labs    40/98/11 0842 03/17/23 0314  NA 143 143  K 4.4 4.3  CL 105 103  CO2 24 27  GLUCOSE 160* 105*  BUN 19 21  CREATININE 1.60* 1.75*  CALCIUM 9.4 9.3   Liver Function Tests No results for input(s): "AST", "ALT", "ALKPHOS", "BILITOT", "PROT", "ALBUMIN" in the last 72 hours. No  results for input(s): "LIPASE", "AMYLASE" in the last 72 hours. Cardiac Enzymes No results for input(s): "CKTOTAL", "CKMB", "CKMBINDEX", "TROPONINI" in the last 72 hours.  BNP: BNP (last 3 results) Recent Labs    07/07/22 1131 08/27/22 1528 03/16/23 0842  BNP 1,338.2* 1,454.9* 1,998.7*    ProBNP (last 3 results) No results for input(s): "PROBNP" in the last 8760 hours.   D-Dimer No results for input(s): "DDIMER" in the last 72 hours. Hemoglobin A1C Recent Labs    03/16/23 1625  HGBA1C 6.3*   Fasting Lipid Panel No results for input(s): "CHOL", "HDL", "LDLCALC", "TRIG", "CHOLHDL", "LDLDIRECT" in the last 72 hours. Thyroid Function Tests No results for input(s): "TSH", "T4TOTAL", "T3FREE", "THYROIDAB" in the last 72 hours.  Invalid input(s): "FREET3"  Other results:   Imaging    No results found.   Medications:     Scheduled Medications:  amiodarone  200 mg Oral BID   apixaban  5 mg Oral BID   atorvastatin  80 mg Oral Daily   clopidogrel  75 mg Oral Daily   docusate sodium  100 mg Oral BID   empagliflozin  10 mg Oral Daily   furosemide  40 mg Intravenous BID   insulin aspart  0-15 Units Subcutaneous TID WC   insulin aspart  0-5 Units Subcutaneous QHS   metoprolol succinate  25 mg Oral Daily   sacubitril-valsartan  1 tablet Oral BID   sodium chloride flush  3 mL Intravenous Q12H    Infusions:   PRN Medications: acetaminophen **OR** acetaminophen, bisacodyl, hydrALAZINE, polyethylene glycol, traZODone    Patient Profile   [name] Goodman is an 82 y.o.male with CAD s/p CABG 08/23, HFmEF, atrial fibrillation, HTN. AHF team to see for acute on chronic systolic heart failure   Assessment/Plan   Acute/Chronic systolic CHF - pre-CABG LV gram (7/23): EF 35-40%  - Intraop TEE 05/08/22: EF 40-45% - Limited echo 05/19/22: EF 25-30%, RV mildly reduced - S/p PCI/DES to RCA and PCI/DES to p Lcx into OM1 9/23 (see below).  - Echo 08/27/22 EF 25-30% moderate  to severe MR. - cMRI 3/24: EF 43% RVEF 40% Mod MR. Coronary pattern LGE in the mid inferoseptal wall (>50% wall thickness, unlikely to be viable) and the basal inferolateral wall (<50% wall thickness, likely viable). - suspect primarily iCM. Had early graft failure post CABG. vs PVC  - NYHA IIIa. Volume status improving. Needs one more dose of IV lasix. Check REDs Clip  - Stop IV lasix after next dose. Tomorrow he will start lasix 40 mg  M-W-F    - Continue Entresto 24/26 mg bid.  - Off spiro due to AKI and hyperkalemia - Continue Jardiance 10 mg daily.  - Tomorrow restart Toprol XL 50 mg daily .  - Repeat ECHO pending.  - strict I&O, daily weights    2. CAD - S/p CABG X 5 (LIMA to LAD, SVG to PDA, SVG to D1, sequential left radial artery to OM and PL) 05/08/22 - Early graft failure with occlusion of SVG to D1 and SVG to RCA. LIMA to LAD and radial graft to OM1 and RPLV patent - S/p PCI/DES to RCA and PCI/DES p Lcx into OM1 05/27/22. - No chest pain.  - Continue Plavix and Eliquis. - Continue statin   3. Persistent atrial fibrillation/AFL  - Noted post CABG. - Had recurrence at time of recent admission. Converted to SR spontaneously.  - Zio 2 week (9/23) showed mostly AF/AFL 84% burden, frequent PVCs (18.6%) - Zio 11/23 100% AFL  - s/p DC-CV 12/23 - Zio 5/24: NSR with 1st degree AVB, rare PACs and PVCs - Has seen Dr. Nelly Laurence and discussed possible ablation in 4/24. Has cancelled.  - In SR today. Cut back amio to 200mg  daily .  - Continue Eliquis 5 mg bid.   4. Frequent PVCs - Zio 2 week (9/23): PVCs 18.6% - Previously discussed sleep study, he was not interested - Repeat Zio 2 11/23 10.7% PVCs - Zio 5/24: NSR with 1st degree AVB, rare PACs and PVCs - remains on amio as noted above.    5. HTN - Stable.    6. CKD IIIa - Baseline Scr 1.3-1.5 - SCr 1.6->1.75  - monitor with diuresis, avoid hypotension   7. Recent PE - Post CABG - Continue Eliquis 5 mg bid. (On for AF)    8. Mitral regurgitation - moderate to severe on previosus echo - likely ischemic (vs functional) - cMRI 3/24 moderate MR - Continue to follow   Reds Clip 37% . Echo pending.    Can go home after ECHO    Heart failure team will sign off as of 03/17/23  HF Team Medication Recommendations for Home: Lasix 40 mg M-W-F Jardiance 10 mg daily.  Amio 200 mg daily Toprol  XL 50 mg daily  Eliquis 5 mg twice a  day  Plavix 75 mg daily  Atorvastatin 80 mg daily  Entresto 24-26 mg twice a day   Follow up as an outpatient: F/U has been set up.   Length of Stay: 0  Tonye Becket, NP  03/17/2023, 9:20 AM  Advanced Heart Failure Team Pager [phone] (M-F; 7a - 5p)  Please contact [facility] for night-coverage after hours (5p -7a ) and weekends on [email or web address]  Patient seen and examined with the above-signed Advanced Practice Provider and/or Housestaff. I personally reviewed laboratory data, imaging studies and relevant notes. I independently examined the patient and formulated the important aspects of the plan. I have edited the note to reflect any of my changes or salient points. I have personally discussed the plan with the patient and/or family.  Volume status much improved. ReDS 37% Denies SOB, orthopnea or PND. Anxious to go home  General:  Well appearing. No resp difficulty HEENT: normal Neck: supple. JVP 6 Carotids 2+ bilat; no bruits. No lymphadenopathy or thryomegaly appreciated. Cor: PMI nondisplaced. Regular rate & rhythm. No rubs, gallops or murmurs. Lungs: clear Abdomen: soft, nontender, nondistended. No hepatosplenomegaly. No bruits or masses. Good bowel sounds. Extremities: no cyanosis, clubbing, rash, edema Neuro: alert & orientedx3, cranial nerves grossly intact. moves all 4 extremities w/o difficulty. Affect pleasant  Much improved with IV diuresis. Repeat echo pending. Will add lasix 40 mg MWF + PRN,  Discussed salt restriction with him and his wife. We have arranged f/u  for him and also reviewed the process of reaching out to the HF Clinic.   Arvilla Meres, MD  11:50 AM

## 2023-03-17 NOTE — Progress Notes (Signed)
ReDS Vest / Clip - 03/17/23 1029       ReDS Vest / Clip   Station Marker C    Ruler Value 29    ReDS Value Range Moderate volume overload    ReDS Actual Value 37              Sache Sane, BSN, Scientist, clinical (histocompatibility and immunogenetics) Only

## 2023-03-17 NOTE — Progress Notes (Signed)
  Echocardiogram 2D Echocardiogram has been performed.  Milda Smart 03/17/2023, 11:04 AM

## 2023-04-06 ENCOUNTER — Other Ambulatory Visit
Admission: RE | Admit: 2023-04-06 | Discharge: 2023-04-06 | Disposition: A | Discharge status: Home / Self Care | Payer: PPO | Source: Ambulatory Visit | Attending: Internal Medicine | Admitting: Internal Medicine

## 2023-04-06 ENCOUNTER — Encounter: Payer: Self-pay | Admitting: Internal Medicine

## 2023-04-06 ENCOUNTER — Ambulatory Visit: Payer: PPO | Admitting: Internal Medicine

## 2023-04-06 VITALS — BP 128/76 | HR 56 | Wt 207.0 lb

## 2023-04-06 DIAGNOSIS — I480 Paroxysmal atrial fibrillation: Secondary | ICD-10-CM

## 2023-04-06 DIAGNOSIS — I2510 Atherosclerotic heart disease of native coronary artery without angina pectoris: Secondary | ICD-10-CM | POA: Diagnosis not present

## 2023-04-06 DIAGNOSIS — I5022 Chronic systolic (congestive) heart failure: Secondary | ICD-10-CM

## 2023-04-06 DIAGNOSIS — I340 Nonrheumatic mitral (valve) insufficiency: Secondary | ICD-10-CM

## 2023-04-06 LAB — BASIC METABOLIC PANEL
Anion gap: 7 (ref 5–15)
BUN: 26 mg/dL — ABNORMAL HIGH (ref 8–23)
CO2: 28 mmol/L (ref 22–32)
Calcium: 9.4 mg/dL (ref 8.9–10.3)
Chloride: 109 mmol/L (ref 98–111)
Creatinine, Ser: 1.5 mg/dL — ABNORMAL HIGH (ref 0.61–1.24)
GFR, Estimated: 46 mL/min — ABNORMAL LOW (ref >60)
Glucose, Bld: 96 mg/dL (ref 70–99)
Potassium: 5 mmol/L (ref 3.5–5.1)
Sodium: 144 mmol/L (ref 135–145)

## 2023-04-06 LAB — BRAIN NATRIURETIC PEPTIDE: B Natriuretic Peptide: 1089.5 pg/mL — ABNORMAL HIGH (ref 0.0–100.0)

## 2023-04-06 NOTE — Progress Notes (Signed)
ADVANCED HF CLINIC NOTE   Primary Care: [name], MD Primary Cardiologist: Dr. Darrold Junker HF Cardiologist: Dr. Gala Romney  HPI: [name] Goodman is an 82 y.o.male with CAD s/p CABG 08/23, HFmEF, atrial fibrillation, HTN.    Underwent CABG X 5 in 8/23. Intra-op TEE with LVEF 40-45%. Had postoperative AF and developed bradycardia on amiodarone drip. Converted to SR.    Readmitted on 05/18/22 with segmental and subsegmental PE RML/RUL, CHF, left pleural effusion s/p thoracentesis and recurrent atrial fibrillation. Echo EF 25-30%, mildly RV HK. Converted to SR during admit. PVCs also noted on tele. Had early graft failure with occlusion of SVG to Diag and SVG to RCA occluded. LIMA to LAD and radial jump graft to OM and RPLV patent. Underwent successful staged PCI/DES to distal RCA and PCI/DES p Cx into OM1.   Zio 9/23 showed AF/AFL burden 84% with PVC burden of 18.6%. Started on amiodarone.  F/u 10/23, stable NYHA II-early III and volume OK.  Zio 11/23 Continuous AFL (avg 76 bpm) PVC 10.7% (2 morphologies 6.0% & 4.7%)   Echo 08/27/22 EF 25-30% mod-sev MR.  Had DC-CV 09/02/22   cMRI 3/24: EF 43% RVEF 40% Mod MR. Coronary pattern LGE in the mid inferoseptal wall (>50% wall thickness, unlikely to be viable) and the basal inferolateral wall (<50% wall thickness, likely viable).  Admitted 03/16/23 with volume overload. Diuresed with IV lasix. Lasix started MWF  Today he returns for HF follow up. Doing great. Walking 2 miles per day and sometimes 2 in the afternoon. Not playing golf due to the heat. Denies CP, SOB, orthopnea or PND. Following weights.     Past Medical History:  Diagnosis Date   Arthritis    Atrial fibrillation (HCC)    Chronic systolic (congestive) heart failure (HCC)    History of kidney stones    Hyperlipidemia    Hypertension    Nocturia    S/P CABG (coronary artery bypass graft)     Current Outpatient Medications  Medication Sig Dispense Refill    apixaban (ELIQUIS) 5 MG TABS tablet Take 1 tablet (5 mg total) by mouth 2 (two) times daily. 60 tablet 6   atorvastatin (LIPITOR) 80 MG tablet Take 1 tablet (80 mg total) by mouth daily. 30 tablet 7   clopidogrel (PLAVIX) 75 MG tablet Take 1 tablet (75 mg total) by mouth daily. 30 tablet 7   empagliflozin (JARDIANCE) 10 MG TABS tablet Take 1 tablet (10 mg total) by mouth daily. 30 tablet 7   furosemide (LASIX) 40 MG tablet Take  40 mg daily on Mondays, Wednesdays and Friday 30 tablet 1   metoprolol succinate (TOPROL-XL) 50 MG 24 hr tablet Take 1 tablet (50 mg total) by mouth daily. Take with or immediately following a meal. 30 tablet 6   sacubitril-valsartan (ENTRESTO) 24-26 MG Take 1 tablet by mouth 2 (two) times daily. 60 tablet 11   amiodarone (PACERONE) 200 MG tablet Take 1 tablet (200 mg total) by mouth daily. (Patient taking differently: Take 200 mg by mouth daily.) 30 tablet 1   cyanocobalamin (VITAMIN B12) 1000 MCG tablet Take 1,000 mcg by mouth daily. (Patient not taking: Reported on 04/06/2023)     No current facility-administered medications for this visit.   No Known Allergies  Social History   Socioeconomic History   Marital status: Divorced    Spouse name: Not on file   Number of children: 3   Years of education: Not on file   Highest education level: Master's degree (  e.g., MA, MS, MEng, MEd, MSW, MBA)  Occupational History   Occupation: retired Psychologist, occupational at [employer]  Tobacco Use   Smoking status: Former    Current packs/day: 0.00    Average packs/day: 1 pack/day for 15.0 years (15.0 ttl pk-yrs)    Types: Cigarettes    Start date: 07/16/1960    Quit date: 07/17/1975    Years since quitting: 47.7    Passive exposure: Past   Smokeless tobacco: Never  Vaping Use   Vaping status: Never Used  Substance and Sexual Activity   Alcohol use: No   Drug use: No   Sexual activity: Not on file  Other Topics Concern   Not on file  Social History Narrative   Lives alone   Social  Determinants of Health   Financial Resource Strain: Low Risk  (05/20/2022)   Overall Financial Resource Strain (CARDIA)    Difficulty of Paying Living Expenses: Not hard at all  Food Insecurity: No Food Insecurity (05/20/2022)   Hunger Vital Sign    Worried About Running Out of Food in the Last Year: Never true    Ran Out of Food in the Last Year: Never true  Transportation Needs: No Transportation Needs (05/20/2022)   PRAPARE - Administrator, Civil Service (Medical): No    Lack of Transportation (Non-Medical): No  Physical Activity: Not on file  Stress: Not on file  Social Connections: Not on file  Intimate Partner Violence: Not on file   Family History  Problem Relation Age of Onset   Hypertension Father    Cancer Father    BP 128/76 (BP Location: Right Arm, Patient Position: Sitting)   Pulse (!) 56   Wt 207 lb (93.9 kg)   SpO2 98%   BMI 27.31 kg/m   Wt Readings from Last 3 Encounters:  04/06/23 207 lb (93.9 kg)  03/16/23 207 lb (93.9 kg)  02/02/23 207 lb 3.2 oz (94 kg)   PHYSICAL EXAM: General:  Well appearing. No resp difficulty HEENT: normal Neck: supple. no JVD. Carotids 2+ bilat; no bruits. No lymphadenopathy or thryomegaly appreciated. Cor: PMI nondisplaced. Regular rate & rhythm. No rubs, gallops or murmurs. Lungs: clear Abdomen: soft, nontender, nondistended. No hepatosplenomegaly. No bruits or masses. Good bowel sounds. Extremities: no cyanosis, clubbing, rash, edema Neuro: alert & orientedx3, cranial nerves grossly intact. moves all 4 extremities w/o difficulty. Affect pleasant   ASSESSMENT & PLAN:  Chronic systolic CHF - pre-CABG LV gram (7/23): EF 35-40%  - Intraop TEE 05/08/22: EF 40-45% - Limited echo 05/19/22: EF 25-30%, RV mildly reduced - S/p PCI/DES to RCA and PCI/DES to p Lcx into OM1 9/23 (see below).  - Echo 08/27/22 EF 25-30% moderate to severe MR. - cMRI 3/24: EF 43% RVEF 40% Mod MR. Coronary pattern LGE in the mid inferoseptal wall  (>50% wall thickness, unlikely to be viable) and the basal inferolateral wall (<50% wall thickness, likely viable). - suspect primarily iCM. Had early graft failure post CABG. vs PVC  - Echo 03/16/23: EF 35-40%  - Doing well. NYHA I. Volume ok. Continue lasix 40mg  MWF - Continue Entresto 24/26 mg bid. BP too low to titrate - Off spiro due to AKI and hyperkalemia - Continue Jardiance 10 mg daily. - Continue Toprol 50   2. CAD - S/p CABG X 5 (LIMA to LAD, SVG to PDA, SVG to D1, sequential left radial artery to OM and PL) 05/08/22 - Early graft failure with occlusion of SVG to D1 and SVG  to RCA. LIMA to LAD and radial graft to OM1 and RPLV patent - S/p PCI/DES to RCA and PCI/DES p Lcx into OM1 05/27/22. - No s/s angina - Continue Plavix and Eliquis. - Continue statin  3. Persistent atrial fibrillation/AFL  - Noted post CABG. - Had recurrence at time of recent admission. Converted to SR spontaneously.  - Zio 2 week (9/23) showed mostly AF/AFL 84% burden, frequent PVCs (18.6%) - Zio 11/23 100% AFL  - s/p DC-CV 12/23 - Has seen Dr. Nelly Laurence and discussed possible ablation in 4/24. Has cancelled.  - Continue 100mg  daily for now - In NSR today - Continue Eliquis 5 mg bid.  4. Frequent PVCs - Zio 2 week (9/23): PVCs 18.6% - Previously discussed sleep study, he was not interested - Repeat Zio 2 11/23 10.7% PVCs - remains on amio.Continue amio 100mg  daily - Repeat zio to reassess AF and PVC burden - Amio labs today   5. HTN - Blood pressure well controlled. Continue current regimen.   6. CKD IIIa - Baseline Scr 1.3-1.5 - Labs today   7. H/o PE - Post CABG - Continue Eliquis 5 mg bid. (On for AF)   8. Mitral regurgitation - moderate to severe on previosus echo - likely ischemic (vs functional) - cMRI 3/24 moderate MR - echo 6/24 mild to moderate MR  - Continue to follow   Arvilla Meres, MD  2:10 PM

## 2023-04-06 NOTE — Patient Instructions (Signed)
   Lab Work:  Labs done today, your results will be available in MyChart, we will contact you for abnormal readings.    Follow-Up in: please follow up in 3 months. Call our clinic in September to schedule your October appointment with Dr. Gala Romney.     If you have any questions or concerns before your next appointment please send [place] a message through [place] or call our office at [phone] Monday-Friday 8 am-5 pm.   If you have an urgent need after hours on the weekend please call your Primary Cardiologist or the Advanced Heart Failure Clinic in [place] at [phone].

## 2023-04-13 ENCOUNTER — Encounter: Payer: Self-pay | Admitting: Cardiovascular Disease

## 2023-04-13 ENCOUNTER — Ambulatory Visit: Payer: PPO | Attending: Cardiovascular Disease | Admitting: Cardiovascular Disease

## 2023-04-20 ENCOUNTER — Telehealth: Payer: Self-pay | Admitting: Cardiovascular Disease

## 2023-04-20 NOTE — Telephone Encounter (Signed)
Patient received a notice that he missed 7/22 3 month afib ablation follow up with Dr. Nelly Laurence, but he would like to know why the appointment was not cancelled. He states he cancelled 4/22 ablation and all other appointments should have been cancelled at that time. Please advise.

## 2023-04-21 NOTE — Telephone Encounter (Signed)
Attempted to contact patient, left message to call office back. Apologized for the appointment confusion and that the patient would not be charged for a No Show visit as that missed appt should have been canceled. Verified on patients account, no charge for the no show on 04/13/23

## 2023-06-06 ENCOUNTER — Other Ambulatory Visit (HOSPITAL_COMMUNITY): Payer: Self-pay | Admitting: Family Medicine

## 2023-06-09 ENCOUNTER — Other Ambulatory Visit (HOSPITAL_COMMUNITY): Payer: Self-pay | Admitting: Family Medicine

## 2023-06-09 DIAGNOSIS — I5022 Chronic systolic (congestive) heart failure: Secondary | ICD-10-CM

## 2023-07-03 ENCOUNTER — Ambulatory Visit: Payer: PPO | Attending: Internal Medicine | Admitting: Internal Medicine

## 2023-07-03 VITALS — BP 148/79 | HR 51 | Wt 216.0 lb

## 2023-07-03 DIAGNOSIS — I493 Ventricular premature depolarization: Secondary | ICD-10-CM

## 2023-07-03 DIAGNOSIS — I5022 Chronic systolic (congestive) heart failure: Secondary | ICD-10-CM | POA: Diagnosis not present

## 2023-07-03 DIAGNOSIS — I2510 Atherosclerotic heart disease of native coronary artery without angina pectoris: Secondary | ICD-10-CM | POA: Diagnosis not present

## 2023-07-03 DIAGNOSIS — I480 Paroxysmal atrial fibrillation: Secondary | ICD-10-CM

## 2023-07-03 DIAGNOSIS — M1000 Idiopathic gout, unspecified site: Secondary | ICD-10-CM | POA: Diagnosis not present

## 2023-07-03 MED ORDER — ALLOPURINOL 100 MG PO TABS: 200.0000 mg | ORAL_TABLET | Freq: Every day | ORAL | 6 refills | Status: DC

## 2023-07-03 NOTE — Patient Instructions (Addendum)
Medication Changes:  Take Allopurinol 100 mg (1 tablet) daily for 1 week. THEN  on October 18: Start taking Allopurinol 200 mg (2 tablets) daily.   Lab Work:  Labs done today, your results will be available in MyChart, we will contact you for abnormal readings.    Special Instructions // Education:  Do the following things EVERYDAY: Weigh yourself in the morning before breakfast. Write it down and keep it in a log. Take your medicines as prescribed Eat low salt foods--Limit salt (sodium) to 2000 mg per day.  Stay as active as you can everyday Limit all fluids for the day to less than 2 liters   Follow-Up in: Please call in December to schedule your January appointment.     If you have any questions or concerns before your next appointment please send [place] a message through [place] or call our office at [phone] Monday-Friday 8 am-5 pm.   If you have an urgent need after hours on the weekend please call your Primary Cardiologist or the Advanced Heart Failure Clinic in [place] at [phone].   At the Advanced Heart Failure Clinic, you and your health needs are our priority. We have a designated team specialized in the treatment of Heart Failure. This Care Team includes your primary Heart Failure Specialized Cardiologist (physician), Advanced Practice Providers (APPs- Physician Assistants and Nurse Practitioners), and Pharmacist who all work together to provide you with the care you need, when you need it.   You may see any of the following providers on your designated Care Team at your next follow up:  Dr. Arvilla Meres Dr. Marca Ancona Dr. Dorthula Nettles Dr. Theresia Bough Tonye Becket, NP Robbie Lis, [place] [address] [place], [place] Brynda Peon, NP [place] Lee, NP Clarisa Kindred, NP Enos Fling, PharmD

## 2023-07-03 NOTE — Progress Notes (Signed)
ADVANCED HF CLINIC NOTE   Primary Care: [name], MD Primary Cardiologist: Dr. Darrold Junker HF Cardiologist: Dr. Gala Romney  HPI: Lanell Dubie is an 82 y.o.male with CAD s/p CABG 08/23, HFmEF, atrial fibrillation, HTN.    Underwent CABG X 5 in 8/23. Intra-op TEE with LVEF 40-45%. Had postoperative AF and developed bradycardia on amiodarone drip. Converted to SR.    Readmitted on 05/18/22 with segmental and subsegmental PE RML/RUL, CHF, left pleural effusion s/p thoracentesis and recurrent atrial fibrillation. Echo EF 25-30%, mildly RV HK. Converted to SR during admit. PVCs also noted on tele. Had early graft failure with occlusion of SVG to Diag and SVG to RCA occluded. LIMA to LAD and radial jump graft to OM and RPLV patent. Underwent successful staged PCI/DES to distal RCA and PCI/DES p Cx into OM1.   Zio 9/23 showed AF/AFL burden 84% with PVC burden of 18.6%. Started on amiodarone.  F/u 10/23, stable NYHA II-early III and volume OK.  Zio 11/23 Continuous AFL (avg 76 bpm) PVC 10.7% (2 morphologies 6.0% & 4.7%)   Echo 08/27/22 EF 25-30% mod-sev MR.  Had DC-CV 09/02/22   cMRI 3/24: EF 43% RVEF 40% Mod MR. Coronary pattern LGE in the mid inferoseptal wall (>50% wall thickness, unlikely to be viable) and the basal inferolateral wall (<50% wall thickness, likely viable).  Admitted 03/16/23 with volume overload. Diuresed with IV lasix. Lasix started MWF  Today he returns for HF follow up with his wife. Taking lasix 40 mg about 2-3x/week depending on his golf schedule. (Supposed to be MWF). Denies CP, SOB, edema or PND. Has gout 3x in past few months. BP at home 120/60s   Past Medical History:  Diagnosis Date   Arthritis    Atrial fibrillation (HCC)    Chronic systolic (congestive) heart failure (HCC)    History of kidney stones    Hyperlipidemia    Hypertension    Nocturia    S/P CABG (coronary artery bypass graft)     Current Outpatient Medications  Medication Sig  Dispense Refill   amiodarone (PACERONE) 200 MG tablet TAKE ONE TABLET BY MOUTH ONE TIME DAILY (Patient taking differently: Take 100 mg by mouth daily.) 30 tablet 1   apixaban (ELIQUIS) 5 MG TABS tablet Take 1 tablet (5 mg total) by mouth 2 (two) times daily. 60 tablet 6   atorvastatin (LIPITOR) 80 MG tablet TAKE ONE TABLET BY MOUTH ONE TIME DAILY 30 tablet 7   clopidogrel (PLAVIX) 75 MG tablet TAKE ONE TABLET BY MOUTH ONE TIME DAILY 30 tablet 7   cyanocobalamin (VITAMIN B12) 1000 MCG tablet Take 1,000 mcg by mouth daily.     empagliflozin (JARDIANCE) 10 MG TABS tablet Take 1 tablet (10 mg total) by mouth daily. 30 tablet 7   furosemide (LASIX) 40 MG tablet Take  40 mg daily on Mondays, Wednesdays and Friday 30 tablet 1   metoprolol succinate (TOPROL-XL) 50 MG 24 hr tablet Take 1 tablet (50 mg total) by mouth daily. Take with or immediately following a meal. 30 tablet 6   sacubitril-valsartan (ENTRESTO) 24-26 MG Take 1 tablet by mouth 2 (two) times daily. 60 tablet 11   No current facility-administered medications for this visit.   No Known Allergies  Social History   Socioeconomic History   Marital status: Divorced    Spouse name: Not on file   Number of children: 3   Years of education: Not on file   Highest education level: Master's degree (e.g., MA, MS, MEng,  MEd, MSW, MBA)  Occupational History   Occupation: retired Psychologist, occupational at [employer]  Tobacco Use   Smoking status: Former    Current packs/day: 0.00    Average packs/day: 1 pack/day for 15.0 years (15.0 ttl pk-yrs)    Types: Cigarettes    Start date: 07/16/1960    Quit date: 07/17/1975    Years since quitting: 47.9    Passive exposure: Past   Smokeless tobacco: Never  Vaping Use   Vaping status: Never Used  Substance and Sexual Activity   Alcohol use: No   Drug use: No   Sexual activity: Not on file  Other Topics Concern   Not on file  Social History Narrative   Lives alone   Social Determinants of Health   Financial  Resource Strain: Low Risk  (05/20/2022)   Overall Financial Resource Strain (CARDIA)    Difficulty of Paying Living Expenses: Not hard at all  Food Insecurity: No Food Insecurity (05/20/2022)   Hunger Vital Sign    Worried About Running Out of Food in the Last Year: Never true    Ran Out of Food in the Last Year: Never true  Transportation Needs: No Transportation Needs (05/20/2022)   PRAPARE - Administrator, Civil Service (Medical): No    Lack of Transportation (Non-Medical): No  Physical Activity: Not on file  Stress: Not on file  Social Connections: Not on file  Intimate Partner Violence: Not on file   Family History  Problem Relation Age of Onset   Hypertension Father    Cancer Father    BP (!) 148/79   Pulse (!) 51   Wt 216 lb (98 kg)   SpO2 100%   BMI 28.50 kg/m   Wt Readings from Last 3 Encounters:  07/03/23 216 lb (98 kg)  04/06/23 207 lb (93.9 kg)  03/16/23 207 lb (93.9 kg)   PHYSICAL EXAM: General:  Well appearing. No resp difficulty HEENT: normal Neck: supple. no JVD. Carotids 2+ bilat; no bruits. No lymphadenopathy or thryomegaly appreciated. Cor: PMI nondisplaced. Regular rate & rhythm. No rubs, gallops or murmurs. Lungs: clear Abdomen: soft, nontender, nondistended. No hepatosplenomegaly. No bruits or masses. Good bowel sounds. Extremities: no cyanosis, clubbing, rash, edema Neuro: alert & orientedx3, cranial nerves grossly intact. moves all 4 extremities w/o difficulty. Affect pleasant   ASSESSMENT & PLAN:  Chronic systolic CHF - pre-CABG LV gram (7/23): EF 35-40%  - Intraop TEE 05/08/22: EF 40-45% - Limited echo 05/19/22: EF 25-30%, RV mildly reduced - S/p PCI/DES to RCA and PCI/DES to p Lcx into OM1 9/23 (see below).  - Echo 08/27/22 EF 25-30% moderate to severe MR. - cMRI 3/24: EF 43% RVEF 40% Mod MR. Coronary pattern LGE in the mid inferoseptal wall (>50% wall thickness, unlikely to be viable) and the basal inferolateral wall (<50% wall  thickness, likely viable). - suspect primarily iCM. Had early graft failure post CABG. vs PVC  - Echo 03/16/23: EF 35-40%  - Doing well. NYHA I-II Volume ok Encouraged him to continue lasix 40mg  at least 3x/week - Continue Entresto 24/26 mg bid. Have not titrated due to low BP in past. Can follow and titrate as toelrted.  - Off spiro due to AKI and hyperkalemia - Continue Jardiance 10 mg daily. - Continue Toprol 50  - Labs today  2. CAD - S/p CABG X 5 (LIMA to LAD, SVG to PDA, SVG to D1, sequential left radial artery to OM and PL) 05/08/22 - Early graft failure with  occlusion of SVG to D1 and SVG to RCA. LIMA to LAD and radial graft to OM1 and RPLV patent - S/p PCI/DES to RCA and PCI/DES p Lcx into OM1 05/27/22. - No s/s angina - Continue Plavix and Eliquis. - Continue statin  3. Persistent atrial fibrillation/AFL  - Noted post CABG. - Had recurrence at time of recent admission. Converted to SR spontaneously.  - Zio 2 week (9/23) showed mostly AF/AFL 84% burden, frequent PVCs (18.6%) - Zio 11/23 100% AFL  - s/p DC-CV 12/23 - Zio 5/24 No AF Rare PVCs - Has seen Dr. Nelly Laurence and discussed possible ablation in 4/24. Has cancelled.  - Continue 100mg  daily for now - In NSR today on exam - Continue Eliquis 5 mg bid. No bleeding  4. Frequent PVCs - Zio 2 week (9/23): PVCs 18.6% - Previously discussed sleep study, he was not interested - Repeat Zio 2 11/23 10.7% PVCs - remains on amio.Continue amio 100mg  daily - Zio 5/24 No AF Rare PVCs   5. HTN - Blood pressure well controlled. Continue current regimen.  6. CKD IIIa - Baseline Scr 1.3-1.5 - Labs today   7. Mitral regurgitation - moderate to severe on previosus echo - likely ischemic (vs functional) - cMRI 3/24 moderate MR - echo 6/24 mild to moderate MR  - Continue to follow  8. Gout - start allopurinol 100 daily - can double as needed. Dosing discussed with PharmD   Arvilla Meres, MD  10:57 AM

## 2023-07-04 LAB — CBC
Hematocrit: 50.5 % (ref 37.5–51.0)
Hemoglobin: 16 g/dL (ref 13.0–17.7)
MCH: 30 pg (ref 26.6–33.0)
MCHC: 31.7 g/dL (ref 31.5–35.7)
MCV: 95 fL (ref 79–97)
Platelets: 187 10*3/uL (ref 150–450)
RBC: 5.33 x10E6/uL (ref 4.14–5.80)
RDW: 14.2 % (ref 11.6–15.4)
WBC: 5.1 10*3/uL (ref 3.4–10.8)

## 2023-07-04 LAB — BASIC METABOLIC PANEL
BUN/Creatinine Ratio: 14 (ref 10–24)
BUN: 21 mg/dL (ref 8–27)
CO2: 23 mmol/L (ref 20–29)
Calcium: 9.7 mg/dL (ref 8.6–10.2)
Chloride: 105 mmol/L (ref 96–106)
Creatinine, Ser: 1.47 mg/dL — ABNORMAL HIGH (ref 0.76–1.27)
Glucose: 90 mg/dL (ref 70–99)
Potassium: 4.6 mmol/L (ref 3.5–5.2)
Sodium: 143 mmol/L (ref 134–144)
eGFR: 47 mL/min/{1.73_m2} — ABNORMAL LOW (ref >59)

## 2023-07-04 LAB — BRAIN NATRIURETIC PEPTIDE: BNP: 752.4 pg/mL — ABNORMAL HIGH (ref 0.0–100.0)

## 2023-07-06 ENCOUNTER — Other Ambulatory Visit (HOSPITAL_COMMUNITY): Payer: Self-pay | Admitting: Family Medicine

## 2023-07-29 ENCOUNTER — Other Ambulatory Visit (HOSPITAL_COMMUNITY): Payer: Self-pay | Admitting: Family Medicine

## 2023-08-25 ENCOUNTER — Ambulatory Visit: Payer: PPO | Attending: Internal Medicine | Admitting: Internal Medicine

## 2023-08-25 VITALS — BP 140/78 | HR 63 | Wt 216.5 lb

## 2023-08-25 DIAGNOSIS — I493 Ventricular premature depolarization: Secondary | ICD-10-CM

## 2023-08-25 DIAGNOSIS — R195 Other fecal abnormalities: Secondary | ICD-10-CM | POA: Diagnosis not present

## 2023-08-25 DIAGNOSIS — M1000 Idiopathic gout, unspecified site: Secondary | ICD-10-CM | POA: Diagnosis not present

## 2023-08-25 DIAGNOSIS — I2510 Atherosclerotic heart disease of native coronary artery without angina pectoris: Secondary | ICD-10-CM | POA: Diagnosis not present

## 2023-08-25 DIAGNOSIS — I5022 Chronic systolic (congestive) heart failure: Secondary | ICD-10-CM

## 2023-08-25 DIAGNOSIS — I10 Essential (primary) hypertension: Secondary | ICD-10-CM | POA: Diagnosis not present

## 2023-08-25 DIAGNOSIS — I480 Paroxysmal atrial fibrillation: Secondary | ICD-10-CM

## 2023-08-25 NOTE — Patient Instructions (Signed)
Medication Changes:  STOP Plavix  Lab Work:  Labs done today, your results will be available in MyChart, we will contact you for abnormal readings.   Testing/Procedures:  Your physician has requested that you have an echocardiogram. Echocardiography is a painless test that uses sound waves to create images of your heart. It provides your doctor with information about the size and shape of your heart and how well your heart's chambers and valves are working. This procedure takes approximately one hour. There are no restrictions for this procedure. Please do NOT wear cologne, perfume, aftershave, or lotions (deodorant is allowed). Please arrive 15 minutes prior to your appointment time. IN 3 MONTHS  Please note: We ask at that you not bring children with you during ultrasound (echo/ vascular) testing. Due to room size and safety concerns, children are not allowed in the ultrasound rooms during exams. Our front office staff cannot provide observation of children in our lobby area while testing is being conducted. An adult accompanying a patient to their appointment will only be allowed in the ultrasound room at the discretion of the ultrasound technician under special circumstances. We apologize for any inconvenience.   Referrals:  none  Special Instructions // Education:  STOP Raisins  Do the following things EVERYDAY: Weigh yourself in the morning before breakfast. Write it down and keep it in a log. Take your medicines as prescribed Eat low salt foods--Limit salt (sodium) to 2000 mg per day.  Stay as active as you can everyday Limit all fluids for the day to less than 2 liters   Follow-Up in: 3 months with an echocardiogram (March 2024), WE WILL CALL YOU CLOSER TO THEN TO SCHEDULE    If you have any questions or concerns before your next appointment please send [place] a message through mychart or call our office at [phone] Monday-Friday 8 am-5 pm.   If you have an urgent  need after hours on the weekend please call your Primary Cardiologist or the [facility] in [place] at [phone].   At the [facility], you and your health needs are our priority. We have a designated team specialized in the treatment of Heart Failure. This Care Team includes your primary Heart Failure Specialized Cardiologist (physician), Advanced Practice Providers (APPs- Physician Assistants and Nurse Practitioners), and Pharmacist who all work together to provide you with the care you need, when you need it.   You may see any of the following providers on your designated Care Team at your next follow up:  Dr. Arvilla Meres Dr. Marca Ancona Dr. Dorthula Nettles Dr. Theresia Bough Tonye Becket, NP Robbie Lis, [place] [address] [place], [place] Brynda Peon, NP [place] Lee, NP Clarisa Kindred, NP Enos Fling, PharmD

## 2023-08-25 NOTE — Progress Notes (Signed)
ADVANCED HF CLINIC NOTE   Primary Care: [name], MD Primary Cardiologist: Dr. Darrold Junker HF Cardiologist: Dr. Gala Romney  HPI: Derrion Warren is an 82 y.o.male with CAD s/p CABG 08/23, HFmEF, atrial fibrillation, HTN.    Underwent CABG X 5 in 8/23. Intra-op TEE with LVEF 40-45%. Had postoperative AF and developed bradycardia on amiodarone drip. Converted to SR.    Readmitted on 05/18/22 with segmental and subsegmental PE RML/RUL, CHF, left pleural effusion s/p thoracentesis and recurrent atrial fibrillation. Echo EF 25-30%, mildly RV HK. Converted to SR during admit. PVCs also noted on tele. Had early graft failure with occlusion of SVG to Diag and SVG to RCA occluded. LIMA to LAD and radial jump graft to OM and RPLV patent. Underwent successful staged PCI/DES to distal RCA and PCI/DES p Cx into OM1.   Zio 9/23 showed AF/AFL burden 84% with PVC burden of 18.6%. Started on amiodarone.  F/u 10/23, stable NYHA II-early III and volume OK.  Zio 11/23 Continuous AFL (avg 76 bpm) PVC 10.7% (2 morphologies 6.0% & 4.7%)   Echo 08/27/22 EF 25-30% mod-sev MR.  Had DC-CV 09/02/22   cMRI 3/24: EF 43% RVEF 40% Mod MR. Coronary pattern LGE in the mid inferoseptal wall (>50% wall thickness, unlikely to be viable) and the basal inferolateral wall (<50% wall thickness, likely viable).  Admitted 03/16/23 with volume overload. Diuresed with IV lasix. Lasix started MWF  Today he returns for unscheduled HF follow up. He is concerned because he is having very soft stools which started 1-2 months ago. Also having a bit of stool incontinence/urgency. Worried it may be related to his lasix. Not taking stool softeners. Has some hemorrhoidal bleeding but no melena. Breathing well. No edema, orthopnea or PND. Unable to play golf because he is afraid to have an accident. Still walking 3 miles every day without a problem. Not lactose intolerant. Recently started eating oatmeal with lots of raisins      Past Medical History:  Diagnosis Date   Arthritis    Atrial fibrillation (HCC)    Chronic systolic (congestive) heart failure (HCC)    History of kidney stones    Hyperlipidemia    Hypertension    Nocturia    S/P CABG (coronary artery bypass graft)     Current Outpatient Medications  Medication Sig Dispense Refill   allopurinol (ZYLOPRIM) 100 MG tablet Take 2 tablets (200 mg total) by mouth daily. 60 tablet 6   amiodarone (PACERONE) 200 MG tablet TAKE ONE TABLET BY MOUTH ONE TIME DAILY (Patient taking differently: Take 100 mg by mouth daily.) 30 tablet 1   atorvastatin (LIPITOR) 80 MG tablet TAKE ONE TABLET BY MOUTH ONE TIME DAILY 30 tablet 7   clopidogrel (PLAVIX) 75 MG tablet TAKE ONE TABLET BY MOUTH ONE TIME DAILY 30 tablet 7   cyanocobalamin (VITAMIN B12) 1000 MCG tablet Take 1,000 mcg by mouth daily.     ELIQUIS 5 MG TABS tablet TAKE ONE TABLET BY MOUTH TWICE A DAY 60 tablet 6   empagliflozin (JARDIANCE) 10 MG TABS tablet Take 1 tablet (10 mg total) by mouth daily. 30 tablet 7   ENTRESTO 24-26 MG TAKE ONE TABLET BY MOUTH TWICE A DAY 60 tablet 11   furosemide (LASIX) 40 MG tablet Take  40 mg daily on Mondays, Wednesdays and Friday 30 tablet 1   metoprolol succinate (TOPROL-XL) 50 MG 24 hr tablet Take 1 tablet (50 mg total) by mouth daily. Take with or immediately following a meal. 30 tablet  6   No current facility-administered medications for this visit.   No Known Allergies  Social History   Socioeconomic History   Marital status: Divorced    Spouse name: Not on file   Number of children: 3   Years of education: Not on file   Highest education level: Master's degree (e.g., MA, MS, MEng, MEd, MSW, MBA)  Occupational History   Occupation: retired Psychologist, occupational at [employer]  Tobacco Use   Smoking status: Former    Current packs/day: 0.00    Average packs/day: 1 pack/day for 15.0 years (15.0 ttl pk-yrs)    Types: Cigarettes    Start date: 07/16/1960    Quit date: 07/17/1975     Years since quitting: 48.1    Passive exposure: Past   Smokeless tobacco: Never  Vaping Use   Vaping status: Never Used  Substance and Sexual Activity   Alcohol use: No   Drug use: No   Sexual activity: Not on file  Other Topics Concern   Not on file  Social History Narrative   Lives alone   Social Determinants of Health   Financial Resource Strain: Low Risk  (05/20/2022)   Overall Financial Resource Strain (CARDIA)    Difficulty of Paying Living Expenses: Not hard at all  Food Insecurity: No Food Insecurity (05/20/2022)   Hunger Vital Sign    Worried About Running Out of Food in the Last Year: Never true    Ran Out of Food in the Last Year: Never true  Transportation Needs: No Transportation Needs (05/20/2022)   PRAPARE - Administrator, Civil Service (Medical): No    Lack of Transportation (Non-Medical): No  Physical Activity: Not on file  Stress: Not on file  Social Connections: Not on file  Intimate Partner Violence: Not on file   Family History  Problem Relation Age of Onset   Hypertension Father    Cancer Father    BP (!) 140/78   Pulse 63   Wt 216 lb 8 oz (98.2 kg)   SpO2 100%   BMI 28.56 kg/m   Wt Readings from Last 3 Encounters:  08/25/23 216 lb 8 oz (98.2 kg)  07/03/23 216 lb (98 kg)  04/06/23 207 lb (93.9 kg)   PHYSICAL EXAM: General:  Well appearing. No resp difficulty HEENT: normal Neck: supple. no JVD. Carotids 2+ bilat; no bruits. No lymphadenopathy or thryomegaly appreciated. Cor: PMI nondisplaced. Regular rate & rhythm. No rubs, gallops or murmurs. Lungs: clear Abdomen: soft, nontender, nondistended. No hepatosplenomegaly. No bruits or masses. Good bowel sounds. Extremities: no cyanosis, clubbing, rash, edema Neuro: alert & orientedx3, cranial nerves grossly intact. moves all 4 extremities w/o difficulty. Affect pleasant   ASSESSMENT & PLAN:  Chronic systolic CHF - pre-CABG LV gram (7/23): EF 35-40%  - Intraop TEE 05/08/22:  EF 40-45% - Limited echo 05/19/22: EF 25-30%, RV mildly reduced - S/p PCI/DES to RCA and PCI/DES to p Lcx into OM1 9/23 (see below).  - Echo 08/27/22 EF 25-30% moderate to severe MR. - cMRI 3/24: EF 43% RVEF 40% Mod MR. Coronary pattern LGE in the mid inferoseptal wall (>50% wall thickness, unlikely to be viable) and the basal inferolateral wall (<50% wall thickness, likely viable). - suspect primarily iCM. Had early graft failure post CABG. vs PVC  - Echo 03/16/23: EF 35-40%  - Stable NYHA I-II  Volume status ok. Continue lasix 40mg  at least 3x/week - Continue Entresto 24/26 mg bid. Have not titrated due to low BP in  past but if BP remains elevated can increase at next visit - Off spiro due to AKI and hyperkalemia - Continue Jardiance 10 mg daily. - Continue Toprol 50  - Labs today  2. CAD - S/p CABG X 5 (LIMA to LAD, SVG to PDA, SVG to D1, sequential left radial artery to OM and PL) 05/08/22 - Early graft failure with occlusion of SVG to D1 and SVG to RCA. LIMA to LAD and radial graft to OM1 and RPLV patent - S/p PCI/DES to RCA and PCI/DES p Lcx into OM1 05/27/22. - No s/s angina - Now > 1 yr out from PCI. Can stop Plavix - Continue Eliquis. - Continue statin  3. Persistent atrial fibrillation/AFL  - Noted post CABG. - Had recurrence at time of recent admission. Converted to SR spontaneously.  - Zio 2 week (9/23) showed mostly AF/AFL 84% burden, frequent PVCs (18.6%) - Zio 11/23 100% AFL  - s/p DC-CV 12/23 - Zio 5/24 No AF Rare PVCs - Has seen Dr. Nelly Laurence and discussed possible ablation in 4/24. Has cancelled.  - Continue amio 100mg  daily for now - In NSR. No recent AF recurrence - Continue Eliquis 5 mg bid. Stop Plavix as above. No bleeding  4. Frequent PVCs - Zio 2 week (9/23): PVCs 18.6% - Previously discussed sleep study, he was not interested - Repeat Zio 2 11/23 10.7% PVCs - remains on amio.Continue amio 100mg  daily - Zio 5/24 No AF Rare PVCs   5. HTN - Blood pressure  mildly elevated. Plan as above  6. CKD IIIa - Baseline Scr 1.3-1.5 - labs today   7. Mitral regurgitation - moderate to severe on previosus echo - likely ischemic (vs functional) - cMRI 3/24 moderate MR - echo 6/24 mild to moderate MR  - Continue to follow - repeat echo at next visit  8. Gout - No recent flares after starting allopurinol - Continue allopurinol 100 daily  9. Diarrhea/loose stools - likely related to raisin intake - will cut out. - increase soluble fiber - if persists will refer to GI   Arvilla Meres, MD  3:16 PM

## 2023-08-26 LAB — COMPREHENSIVE METABOLIC PANEL
ALT: 18 [IU]/L (ref 0–44)
AST: 19 [IU]/L (ref 0–40)
Albumin: 4.7 g/dL (ref 3.7–4.7)
Alkaline Phosphatase: 96 [IU]/L (ref 44–121)
BUN/Creatinine Ratio: 15 (ref 10–24)
BUN: 22 mg/dL (ref 8–27)
Bilirubin Total: 0.8 mg/dL (ref 0.0–1.2)
CO2: 24 mmol/L (ref 20–29)
Calcium: 9.6 mg/dL (ref 8.6–10.2)
Chloride: 105 mmol/L (ref 96–106)
Creatinine, Ser: 1.42 mg/dL — ABNORMAL HIGH (ref 0.76–1.27)
Globulin, Total: 2.1 g/dL (ref 1.5–4.5)
Glucose: 121 mg/dL — ABNORMAL HIGH (ref 70–99)
Potassium: 5.2 mmol/L (ref 3.5–5.2)
Sodium: 144 mmol/L (ref 134–144)
Total Protein: 6.8 g/dL (ref 6.0–8.5)
eGFR: 49 mL/min/{1.73_m2} — ABNORMAL LOW (ref >59)

## 2023-08-26 LAB — CBC
Hematocrit: 50.1 % (ref 37.5–51.0)
Hemoglobin: 16.5 g/dL (ref 13.0–17.7)
MCH: 30.6 pg (ref 26.6–33.0)
MCHC: 32.9 g/dL (ref 31.5–35.7)
MCV: 93 fL (ref 79–97)
Platelets: 165 10*3/uL (ref 150–450)
RBC: 5.39 x10E6/uL (ref 4.14–5.80)
RDW: 13.4 % (ref 11.6–15.4)
WBC: 5.9 10*3/uL (ref 3.4–10.8)

## 2023-08-26 LAB — T4, FREE: Free T4: 1.32 ng/dL (ref 0.82–1.77)

## 2023-08-26 LAB — TSH: TSH: 2.56 u[IU]/mL (ref 0.450–4.500)

## 2023-08-26 LAB — BRAIN NATRIURETIC PEPTIDE: BNP: 669.8 pg/mL — ABNORMAL HIGH (ref 0.0–100.0)

## 2023-08-27 ENCOUNTER — Other Ambulatory Visit (HOSPITAL_COMMUNITY): Payer: Self-pay

## 2023-08-31 DIAGNOSIS — E785 Hyperlipidemia, unspecified: Secondary | ICD-10-CM | POA: Diagnosis not present

## 2023-08-31 DIAGNOSIS — I10 Essential (primary) hypertension: Secondary | ICD-10-CM | POA: Diagnosis not present

## 2023-08-31 DIAGNOSIS — Z23 Encounter for immunization: Secondary | ICD-10-CM | POA: Diagnosis not present

## 2023-08-31 DIAGNOSIS — E119 Type 2 diabetes mellitus without complications: Secondary | ICD-10-CM | POA: Diagnosis not present

## 2023-08-31 DIAGNOSIS — I2510 Atherosclerotic heart disease of native coronary artery without angina pectoris: Secondary | ICD-10-CM | POA: Diagnosis not present

## 2023-08-31 DIAGNOSIS — I480 Paroxysmal atrial fibrillation: Secondary | ICD-10-CM | POA: Diagnosis not present

## 2023-08-31 DIAGNOSIS — I5022 Chronic systolic (congestive) heart failure: Secondary | ICD-10-CM | POA: Diagnosis not present

## 2023-11-17 ENCOUNTER — Telehealth: Payer: Self-pay | Admitting: Internal Medicine

## 2023-11-17 NOTE — Telephone Encounter (Signed)
 Pt confirmed appt for 11/18/23

## 2023-11-18 ENCOUNTER — Ambulatory Visit: Payer: PPO | Attending: Internal Medicine | Admitting: Internal Medicine

## 2023-11-18 ENCOUNTER — Encounter: Payer: Self-pay | Admitting: Internal Medicine

## 2023-11-18 VITALS — BP 165/86 | HR 54 | Wt 218.8 lb

## 2023-11-18 DIAGNOSIS — I5022 Chronic systolic (congestive) heart failure: Secondary | ICD-10-CM | POA: Insufficient documentation

## 2023-11-18 DIAGNOSIS — N183 Chronic kidney disease, stage 3 unspecified: Secondary | ICD-10-CM | POA: Diagnosis not present

## 2023-11-18 DIAGNOSIS — I2510 Atherosclerotic heart disease of native coronary artery without angina pectoris: Secondary | ICD-10-CM | POA: Diagnosis not present

## 2023-11-18 DIAGNOSIS — I480 Paroxysmal atrial fibrillation: Secondary | ICD-10-CM | POA: Insufficient documentation

## 2023-11-18 MED ORDER — SACUBITRIL-VALSARTAN 97-103 MG PO TABS: 1.0000 | ORAL_TABLET | Freq: Two times a day (BID) | ORAL | 6 refills | Status: AC

## 2023-11-18 MED ORDER — FUROSEMIDE 40 MG PO TABS: ORAL_TABLET | ORAL | 6 refills | Status: AC

## 2023-11-18 NOTE — Patient Instructions (Addendum)
 Medication Changes:  INCREASE Entresto 97/103 (1 tab) two times daily  REFILLS sent for Furosemide  Lab Work:  Go DOWN to LOWER LEVEL (LL) to have your blood work completed inside of [employer] office.  We will only call you if the results are abnormal or if the provider would like to make medication changes.   Testing/Procedures:  Please have your echo completed. You will check in for this at the [facility]. You have to arrive 15 MINS EARLY for preparation, otherwise you will have to reschedule.   Special Instructions // Education:  Do the following things EVERYDAY: Weigh yourself in the morning before breakfast. Write it down and keep it in a log. Take your medicines as prescribed Eat low salt foods--Limit salt (sodium) to 2000 mg per day.  Stay as active as you can everyday Limit all fluids for the day to less than 2 liters   Follow-Up in: Please follow up with the [facility] in 4 months. Our Doctors' schedules are NOT open yet for 4 months. We will place you on our recall list. Once they are available, we will call you to schedule your follow up appointment.   At the [facility], you and your health needs are our priority. We have a designated team specialized in the treatment of Heart Failure. This Care Team includes your primary Heart Failure Specialized Cardiologist (physician), Advanced Practice Providers (APPs- Physician Assistants and Nurse Practitioners), and Pharmacist who all work together to provide you with the care you need, when you need it.   You may see any of the following providers on your designated Care Team at your next follow up:  Dr. Arvilla Meres Dr. Marca Ancona Dr. Dorthula Nettles Dr. Theresia Bough Tonye Becket, NP Robbie Lis, [place] [facility] [place], [place] Brynda Peon, NP [place] Lee, NP Karle Plumber, PharmD   Please be sure to bring in all your medications bottles to every appointment.    Need to Contact us:  If you have any questions or concerns before your next appointment please send [place] a message through [place] or call our office at [phone].    TO LEAVE A MESSAGE FOR THE NURSE SELECT OPTION 2, PLEASE LEAVE A MESSAGE INCLUDING: YOUR NAME DATE OF BIRTH CALL BACK NUMBER REASON FOR CALL**this is important as we prioritize the call backs  YOU WILL RECEIVE A CALL BACK THE SAME DAY AS LONG AS YOU CALL BEFORE 4:00 PM

## 2023-11-18 NOTE — Progress Notes (Signed)
 [facility] CLINIC NOTE   Primary Care: [name], MD Primary Cardiologist: Dr. Darrold Junker HF Cardiologist: Dr. Gala Romney  Reason for Visit/CC: Follow Up for Chronic Systolic Heart Failure   HPI: [name] Goodman is an 83 y.o. male with CAD s/p CABG 08/23, HFmEF, atrial fibrillation, HTN.    Underwent CABG X 5 in 8/23. Intra-op TEE with LVEF 40-45%. Had postoperative AF and developed bradycardia on amiodarone drip. Converted to SR.    Readmitted on 05/18/22 with segmental and subsegmental PE RML/RUL, CHF, left pleural effusion s/p thoracentesis and recurrent atrial fibrillation. Echo EF 25-30%, mildly RV HK. Converted to SR during admit. PVCs also noted on tele. Had early graft failure with occlusion of SVG to Diag and SVG to RCA occluded. LIMA to LAD and radial jump graft to OM and RPLV patent. Underwent successful staged PCI/DES to distal RCA and PCI/DES p Cx into OM1.   Zio 9/23 showed AF/AFL burden 84% with PVC burden of 18.6%. Started on amiodarone.  F/u 10/23, stable NYHA II-early III and volume OK.  Zio 11/23 Continuous AFL (avg 76 bpm) PVC 10.7% (2 morphologies 6.0% & 4.7%)   Echo 08/27/22 EF 25-30% mod-sev MR.  Had DC-CV 09/02/22   cMRI 3/24: EF 43% RVEF 40% Mod MR. Coronary pattern LGE in the mid inferoseptal wall (>50% wall thickness, unlikely to be viable) and the basal inferolateral wall (<50% wall thickness, likely viable).  Admitted 03/16/23 with volume overload. Diuresed with IV lasix. Lasix started MWF  He presents today for routine f/u. Doing well. Denies CP. No dyspnea w/ ADLs. NYHA class II. No dyspnea walking on flat surfaces, only SOB walking up steep inclines.  EKG shows sinus brady 49 bpm. Asymptomatic w/ bradycardia, denies dizziness, syncope/ near syncope. Denies symptoms of breakthrough afib. Compliant w/ all meds. On Eliquis. Reports some occasional hemorrhoidal bleeding but no other bleeding.   BP elevated today at 160/88, checked x 2.    Past  Medical History:  Diagnosis Date   Arthritis    Atrial fibrillation (HCC)    Chronic systolic (congestive) heart failure (HCC)    History of kidney stones    Hyperlipidemia    Hypertension    Nocturia    S/P CABG (coronary artery bypass graft)     Current Outpatient Medications  Medication Sig Dispense Refill   allopurinol (ZYLOPRIM) 100 MG tablet Take 2 tablets (200 mg total) by mouth daily. 60 tablet 6   amiodarone (PACERONE) 200 MG tablet TAKE ONE TABLET BY MOUTH ONE TIME DAILY (Patient taking differently: Take 100 mg by mouth daily.) 30 tablet 1   atorvastatin (LIPITOR) 80 MG tablet TAKE ONE TABLET BY MOUTH ONE TIME DAILY 30 tablet 7   cyanocobalamin (VITAMIN B12) 1000 MCG tablet Take 1,000 mcg by mouth daily.     ELIQUIS 5 MG TABS tablet TAKE ONE TABLET BY MOUTH TWICE A DAY 60 tablet 6   empagliflozin (JARDIANCE) 10 MG TABS tablet Take 1 tablet (10 mg total) by mouth daily. 30 tablet 7   ENTRESTO 24-26 MG TAKE ONE TABLET BY MOUTH TWICE A DAY 60 tablet 11   furosemide (LASIX) 40 MG tablet Take  40 mg daily on Mondays, Wednesdays and Friday 30 tablet 1   metoprolol succinate (TOPROL-XL) 50 MG 24 hr tablet Take 1 tablet (50 mg total) by mouth daily. Take with or immediately following a meal. 30 tablet 6   No current facility-administered medications for this visit.   No Known Allergies  Social History  Socioeconomic History   Marital status: Divorced    Spouse name: Not on file   Number of children: 3   Years of education: Not on file   Highest education level: Master's degree (e.g., MA, MS, MEng, MEd, MSW, MBA)  Occupational History   Occupation: retired Psychologist, occupational at [employer]  Tobacco Use   Smoking status: Former    Current packs/day: 0.00    Average packs/day: 1 pack/day for 15.0 years (15.0 ttl pk-yrs)    Types: Cigarettes    Start date: 07/16/1960    Quit date: 07/17/1975    Years since quitting: 48.3    Passive exposure: Past   Smokeless tobacco: Never  Vaping Use    Vaping status: Never Used  Substance and Sexual Activity   Alcohol use: No   Drug use: No   Sexual activity: Not on file  Other Topics Concern   Not on file  Social History Narrative   Lives alone   Social Drivers of Health   Financial Resource Strain: Low Risk  (05/20/2022)   Overall Financial Resource Strain (CARDIA)    Difficulty of Paying Living Expenses: Not hard at all  Food Insecurity: No Food Insecurity (05/20/2022)   Hunger Vital Sign    Worried About Running Out of Food in the Last Year: Never true    Ran Out of Food in the Last Year: Never true  Transportation Needs: No Transportation Needs (05/20/2022)   PRAPARE - Administrator, Civil Service (Medical): No    Lack of Transportation (Non-Medical): No  Physical Activity: Not on file  Stress: Not on file  Social Connections: Not on file  Intimate Partner Violence: Not on file   Family History  Problem Relation Age of Onset   Hypertension Father    Cancer Father    BP (!) 160/88   Pulse (!) 54   Wt 218 lb 12.8 oz (99.2 kg)   SpO2 100%   BMI 28.87 kg/m   Wt Readings from Last 3 Encounters:  11/18/23 218 lb 12.8 oz (99.2 kg)  08/25/23 216 lb 8 oz (98.2 kg)  07/03/23 216 lb (98 kg)    PHYSICAL EXAM: General:  Well appearing elderly male. No respiratory difficulty HEENT: normal Neck: supple. no JVD. Carotids 2+ bilat; no bruits. No lymphadenopathy or thyromegaly appreciated. Cor: PMI nondisplaced. Regular rate & rhythm. No rubs, gallops or murmurs. Lungs: clear Abdomen: soft, nontender, nondistended. No hepatosplenomegaly. No bruits or masses. Good bowel sounds. Extremities: no cyanosis, clubbing, rash, edema Neuro: alert & oriented x 3, cranial nerves grossly intact. moves all 4 extremities w/o difficulty. Affect pleasant.    ASSESSMENT & PLAN:  1. Chronic systolic CHF due to ischemic CM - pre-CABG LV gram (7/23): EF 35-40%  - Intraop TEE 05/08/22: EF 40-45% - Limited echo 05/19/22: EF  25-30%, RV mildly reduced - S/p PCI/DES to RCA and PCI/DES to p Lcx into OM1 9/23 (see below).  - Echo 08/27/22 EF 25-30% moderate to severe MR. - cMRI 3/24: EF 43% RVEF 40% Mod MR. Coronary pattern LGE in the mid inferoseptal wall (>50% wall thickness, unlikely to be viable) and the basal inferolateral wall (<50% wall thickness, likely viable). - suspect primarily iCM. Had early graft failure post CABG vs PVC meditated  - Echo 03/16/23: EF 35-40%  - Stable NYHA II. Euvolemic on exam  - Continue Lasix 40 mg MWF - Increase Entresto to 97/103 mg bid.  - Off spiro due to AKI and hyperkalemia - Continue Jardiance  10 mg daily. - Continue Toprol XL 50 mg daily  - Check BMP today    2. CAD - S/p CABG X 5 (LIMA to LAD, SVG to PDA, SVG to D1, sequential left radial artery to OM and PL) 05/08/22 - Early graft failure with occlusion of SVG to D1 and SVG to RCA. LIMA to LAD and radial graft to OM1 and RPLV patent - S/p PCI/DES to RCA and PCI/DES p Lcx into OM1 05/27/22. - stable w/o CP  - completed a year of Plavix, now discontinued. No ASA given need for Eliquis  - continue ? blocker and statin   3. Persistent atrial fibrillation/AFL  - First noted post CABG. - Zio 2 week (9/23) showed mostly AF/AFL 84% burden, frequent PVCs (18.6%) - Zio 11/23 100% AFL  - s/p DC-CV 12/23 - Zio 5/24 No AF Rare PVCs - Has seen Dr. Nelly Laurence for ablation but pt declined  - EKG today shows SB, 49 bpm  - Continue amio 100mg  daily (TSH and HFTs ok 12/24>>repeat again in 3 months) - Continue Eliquis 5 mg bid. Check CBC   4. Frequent PVCs - Zio 2 week (9/23): PVCs 18.6% - Previously discussed sleep study, he was not interested - Repeat Zio 2 11/23 10.7% PVCs - Zio 5/24 No AF Rare PVCs - no PVCs on today's EKG  - Continue amio 100mg  daily   5. HTN - moderately elevated, checked x 2 today. Reports recent home systolic BPs in the 140s - increase Entresto to 97-103 bid  - BMP today   6. CKD IIIa - Baseline Scr  1.3-1.5 - BMP today    7. Mitral regurgitation - moderate to severe on previosus echo - likely ischemic (vs functional) - cMRI 3/24 moderate MR - echo 6/24 mild to moderate MR  - plan repeat echo next visit    Cheralyn Oliver Sharol Harness, PA-C  1:50 PM  Patient seen and examined with the above-signed Advanced Practice Provider and/or Housestaff. I personally reviewed laboratory data, imaging studies and relevant notes. I independently examined the patient and formulated the important aspects of the plan. I have edited the note to reflect any of my changes or salient points. I have personally discussed the plan with the patient and/or family.  83 y/o male with CAD and systolic HF due to iCM EF 35-40%  Returns for routine f/u. Has been doing pretty well but not as active due to cold weather.   Has been taking lasix regularly but wants to cut back. Remains in NSR on amio. No angina.   BP elevated  General:  Elderly  No resp difficulty HEENT: normal Neck: supple. no JVD. Carotids 2+ bilat; no bruits. No lymphadenopathy or thryomegaly appreciated. Cor: PMI nondisplaced. Regular rate & rhythm. No rubs, gallops or murmurs. Lungs: clear Abdomen: soft, nontender, nondistended. No hepatosplenomegaly. No bruits or masses. Good bowel sounds. Extremities: no cyanosis, clubbing, rash, edema Neuro: alert & orientedx3, cranial nerves grossly intact. moves all 4 extremities w/o difficulty. Affect pleasant   Overall doing well. NYHA II. Volume status ok. Long discussion about need for compliance with diuretics.   BP elevated. Will increase Entresto to 97/103 bid  Continue amio/Eliquis for AF.   Echo at next visit.   Arvilla Meres, MD  7:26 PM

## 2023-11-19 LAB — BASIC METABOLIC PANEL
BUN/Creatinine Ratio: 14 (ref 10–24)
BUN: 23 mg/dL (ref 8–27)
CO2: 24 mmol/L (ref 20–29)
Calcium: 9.3 mg/dL (ref 8.6–10.2)
Chloride: 106 mmol/L (ref 96–106)
Creatinine, Ser: 1.66 mg/dL — ABNORMAL HIGH (ref 0.76–1.27)
Glucose: 90 mg/dL (ref 70–99)
Potassium: 4.9 mmol/L (ref 3.5–5.2)
Sodium: 146 mmol/L — ABNORMAL HIGH (ref 134–144)
eGFR: 41 mL/min/{1.73_m2} — ABNORMAL LOW (ref >59)

## 2023-11-19 LAB — BRAIN NATRIURETIC PEPTIDE: BNP: 558.9 pg/mL — ABNORMAL HIGH (ref 0.0–100.0)

## 2023-11-30 ENCOUNTER — Other Ambulatory Visit: Payer: Self-pay | Admitting: *Deleted

## 2023-11-30 DIAGNOSIS — N200 Calculus of kidney: Secondary | ICD-10-CM

## 2023-12-04 ENCOUNTER — Ambulatory Visit
Admission: RE | Admit: 2023-12-04 | Discharge: 2023-12-04 | Disposition: A | Discharge status: Home / Self Care | Source: Ambulatory Visit | Attending: Urology | Admitting: Urology

## 2023-12-04 ENCOUNTER — Ambulatory Visit: Payer: PPO | Admitting: Urology

## 2023-12-04 ENCOUNTER — Encounter: Payer: Self-pay | Admitting: Urology

## 2023-12-04 VITALS — BP 156/80 | HR 51 | Ht 73.0 in | Wt 219.0 lb

## 2023-12-04 DIAGNOSIS — N401 Enlarged prostate with lower urinary tract symptoms: Secondary | ICD-10-CM

## 2023-12-04 DIAGNOSIS — N200 Calculus of kidney: Secondary | ICD-10-CM | POA: Diagnosis present

## 2023-12-04 NOTE — Progress Notes (Signed)
 I, [name] Goodman, acting as a scribe for [name] Altes, MD., have documented all relevant documentation on the behalf of [name] Altes, MD, as directed by [name] Altes, MD while in the presence of [name] Altes, MD.  12/04/2023 10:26 AM   [name] Goodman 04/21/1941 [id number]  Referring provider: [name], MD [address] [facility] [place],  [place] [address]  Chief Complaint  Patient presents with   Nephrolithiasis   Urologic history: 1.  BPH with urinary retention Status post TURP 02/2014   2.  Left nephrolithiasis Nonobstructing lower pole renal calculi Prior ureteroscopy/lithotripsy  HPI: [name] Goodman is a 83 y.o. male presents for annual follow-up.  No problems since last year's visit. No bothersome LUTS Denies flank, abdominal or pelvic pain   PMH: Past Medical History:  Diagnosis Date   Arthritis    Atrial fibrillation (HCC)    Chronic systolic (congestive) heart failure (HCC)    History of kidney stones    Hyperlipidemia    Hypertension    Nocturia    S/P CABG (coronary artery bypass graft)     Surgical History: Past Surgical History:  Procedure Laterality Date   APPENDECTOMY     CARDIOVERSION N/A 09/02/2022   Procedure: CARDIOVERSION;  Surgeon: Dolores Patty, MD;  Location: [facility] ENDOSCOPY;  Service: Cardiovascular;  Laterality: N/A;   CATARACT EXTRACTION W/PHACO Right 11/27/2020   Procedure: CATARACT EXTRACTION PHACO AND INTRAOCULAR LENS PLACEMENT (IOC) RIGHT VIVITY LENS toric 6.31 00.41.8;  Surgeon: Galen Manila, MD;  Location: [facility] SURGERY CNTR;  Service: Ophthalmology;  Laterality: Right;   CORONARY ARTERY BYPASS GRAFT N/A 05/08/2022   Procedure: CORONARY ARTERY BYPASS GRAFTING (CABG) TIMES FIVE USING ENDOSCOPICALLY HARVESTED RIGHT GREATER SAPHENOUS VEIN AND LEFT OPEN RADIAL HARVEST VEIN AND LEFT INTERNALLY MAMMARY ARTERY.;  Surgeon: Loreli Slot, MD;  Location: [facility] OR;  Service: Open Heart  Surgery;  Laterality: N/A;   CORONARY PRESSURE/FFR STUDY N/A 05/27/2022   Procedure: INTRAVASCULAR PRESSURE WIRE/FFR STUDY;  Surgeon: Lyn Records, MD;  Location: [facility] INVASIVE CV LAB;  Service: Cardiovascular;  Laterality: N/A;   CORONARY STENT INTERVENTION N/A 05/27/2022   Procedure: CORONARY STENT INTERVENTION;  Surgeon: Lyn Records, MD;  Location: [facility] INVASIVE CV LAB;  Service: Cardiovascular;  Laterality: N/A;   IR THORACENTESIS ASP PLEURAL SPACE W/IMG GUIDE  05/19/2022   JOINT REPLACEMENT     KNEE ARTHROSCOPY Left    KNEE ARTHROSCOPY WITH LATERAL MENISECTOMY Right 11/13/2020   Procedure: Right knee arthroscopy, partial medial and lateral meniscectomy;  Surgeon: Kennedy Bucker, MD;  Location: [facility] ORS;  Service: Orthopedics;  Laterality: Right;   LEFT HEART CATH AND CORONARY ANGIOGRAPHY N/A 04/16/2022   Procedure: LEFT HEART CATH AND CORONARY ANGIOGRAPHY;  Surgeon: Marcina Millard, MD;  Location: [facility] INVASIVE CV LAB;  Service: Cardiovascular;  Laterality: N/A;   POLYPECTOMY     WITH COLONOSCOPY   RADIAL ARTERY HARVEST Left 05/08/2022   Procedure: RADIAL ARTERY HARVEST;  Surgeon: Loreli Slot, MD;  Location: [facility] OR;  Service: Open Heart Surgery;  Laterality: Left;   RIGHT/LEFT HEART CATH AND CORONARY/GRAFT ANGIOGRAPHY N/A 05/23/2022   Procedure: RIGHT/LEFT HEART CATH AND CORONARY/GRAFT ANGIOGRAPHY;  Surgeon: Lyn Records, MD;  Location: [facility] INVASIVE CV LAB;  Service: Cardiovascular;  Laterality: N/A;   TEE WITHOUT CARDIOVERSION N/A 05/08/2022   Procedure: TRANSESOPHAGEAL ECHOCARDIOGRAM (TEE);  Surgeon: Loreli Slot, MD;  Location: [facility] OR;  Service: Open Heart Surgery;  Laterality: N/A;   TOTAL  KNEE ARTHROPLASTY Left 07/23/2015   Procedure: LEFT TOTAL KNEE ARTHROPLASTY;  Surgeon: Ollen Gross, MD;  Location: [facility] ORS;  Service: Orthopedics;  Laterality: Left;   TOTAL KNEE ARTHROPLASTY Right 06/11/2021   Procedure: TOTAL KNEE ARTHROPLASTY;  Surgeon: Kennedy Bucker, MD;   Location: [facility] ORS;  Service: Orthopedics;  Laterality: Right;   TRANSURETHRAL RESECTION OF PROSTATE  ?2015   URETEROSCOPY WITH HOLMIUM LASER LITHOTRIPSY      Home Medications:  Allergies as of 12/04/2023   No Known Allergies      Medication List        Accurate as of December 04, 2023 10:26 AM. If you have any questions, ask your nurse or doctor.          allopurinol 100 MG tablet Commonly known as: Zyloprim Take 2 tablets (200 mg total) by mouth daily.   amiodarone 200 MG tablet Commonly known as: PACERONE TAKE ONE TABLET BY MOUTH ONE TIME DAILY What changed: how much to take   atorvastatin 80 MG tablet Commonly known as: LIPITOR TAKE ONE TABLET BY MOUTH ONE TIME DAILY   cyanocobalamin 1000 MCG tablet Commonly known as: VITAMIN B12 Take 1,000 mcg by mouth daily.   Eliquis 5 MG Tabs tablet Generic drug: apixaban TAKE ONE TABLET BY MOUTH TWICE A DAY   empagliflozin 10 MG Tabs tablet Commonly known as: JARDIANCE Take 1 tablet (10 mg total) by mouth daily.   furosemide 40 MG tablet Commonly known as: LASIX Take  40 mg daily on Mondays, Wednesdays and Friday   metoprolol succinate 50 MG 24 hr tablet Commonly known as: TOPROL-XL Take 1 tablet (50 mg total) by mouth daily. Take with or immediately following a meal.   sacubitril-valsartan 97-103 MG Commonly known as: ENTRESTO Take 1 tablet by mouth 2 (two) times daily.        Allergies: No Known Allergies  Family History: Family History  Problem Relation Age of Onset   Hypertension Father    Cancer Father     Social History:  reports that he quit smoking about 48 years ago. His smoking use included cigarettes. He started smoking about 63 years ago. He has a 15 pack-year smoking history. He has been exposed to tobacco smoke. He has never used smokeless tobacco. He reports that he does not drink alcohol and does not use drugs.   Physical Exam: BP (!) 156/80   Pulse (!) 51   Ht 6\' 1"  (1.854 m)   Wt 219  lb (99.3 kg)   BMI 28.89 kg/m   Constitutional:  Alert and oriented, No acute distress. HEENT: [place] AT Respiratory: Normal respiratory effort, no increased work of breathing. Psychiatric: Normal mood and affect.   Pertinent Imaging: KUB performed earlier today was personally reviewed and interpreted. There are 2 calcifications overlying the left renal outline, which have not changed in location or size from prior KUB of March 2024    Assessment & Plan:    1. Left nephrolithiasis Stable left renal calculi Continue annual follow-up with KUB  I have reviewed the above documentation for accuracy and completeness, and I agree with the above.   [name] Altes, MD  [facility] [address], [address] [place], [place] [address] [phone]

## 2023-12-04 NOTE — Progress Notes (Signed)
 error

## 2023-12-15 ENCOUNTER — Ambulatory Visit
Admission: RE | Admit: 2023-12-15 | Discharge: 2023-12-15 | Disposition: A | Discharge status: Home / Self Care | Payer: PPO | Source: Ambulatory Visit | Attending: Internal Medicine | Admitting: Internal Medicine

## 2023-12-15 DIAGNOSIS — I4891 Unspecified atrial fibrillation: Secondary | ICD-10-CM | POA: Insufficient documentation

## 2023-12-15 DIAGNOSIS — Z951 Presence of aortocoronary bypass graft: Secondary | ICD-10-CM | POA: Diagnosis not present

## 2023-12-15 DIAGNOSIS — N189 Chronic kidney disease, unspecified: Secondary | ICD-10-CM | POA: Diagnosis not present

## 2023-12-15 DIAGNOSIS — R0602 Shortness of breath: Secondary | ICD-10-CM | POA: Insufficient documentation

## 2023-12-15 DIAGNOSIS — R001 Bradycardia, unspecified: Secondary | ICD-10-CM | POA: Insufficient documentation

## 2023-12-15 DIAGNOSIS — I5022 Chronic systolic (congestive) heart failure: Secondary | ICD-10-CM | POA: Diagnosis present

## 2023-12-15 DIAGNOSIS — I130 Hypertensive heart and chronic kidney disease with heart failure and stage 1 through stage 4 chronic kidney disease, or unspecified chronic kidney disease: Secondary | ICD-10-CM | POA: Insufficient documentation

## 2023-12-15 DIAGNOSIS — I340 Nonrheumatic mitral (valve) insufficiency: Secondary | ICD-10-CM | POA: Diagnosis not present

## 2023-12-15 DIAGNOSIS — I2510 Atherosclerotic heart disease of native coronary artery without angina pectoris: Secondary | ICD-10-CM | POA: Insufficient documentation

## 2023-12-15 DIAGNOSIS — I509 Heart failure, unspecified: Secondary | ICD-10-CM | POA: Diagnosis present

## 2023-12-15 DIAGNOSIS — E785 Hyperlipidemia, unspecified: Secondary | ICD-10-CM | POA: Diagnosis not present

## 2023-12-15 LAB — ECHOCARDIOGRAM COMPLETE
AR max vel: 2.26 cm2
AV Area VTI: 2.22 cm2
AV Area mean vel: 1.97 cm2
AV Mean grad: 3 mmHg
AV Peak grad: 4.9 mmHg
Ao pk vel: 1.11 m/s
Area-P 1/2: 1.33 cm2
Calc EF: 43 %
MV VTI: 2.14 cm2
S' Lateral: 4.1 cm
Single Plane A2C EF: 45.8 %
Single Plane A4C EF: 34.9 %

## 2023-12-15 NOTE — Progress Notes (Signed)
*  PRELIMINARY RESULTS* Echocardiogram 2D Echocardiogram has been performed.  [name] Goodman 12/15/2023, 12:18 PM

## 2024-01-20 ENCOUNTER — Other Ambulatory Visit (HOSPITAL_COMMUNITY): Payer: Self-pay | Admitting: Family Medicine

## 2024-02-24 ENCOUNTER — Telehealth: Payer: Self-pay | Admitting: Internal Medicine

## 2024-02-24 NOTE — Telephone Encounter (Signed)
 Called to confirm/remind patient of their appointment at the [facility] on 02/25/24.   Appointment:   [x] Confirmed  [] Left mess   [] No answer/No voice mail  [] VM Full/unable to leave message  [] Phone not in service  Patient reminded to bring all medications and/or complete list.  Confirmed patient has transportation. Gave directions, instructed to utilize valet parking.

## 2024-02-25 ENCOUNTER — Ambulatory Visit: Admitting: Internal Medicine

## 2024-02-25 ENCOUNTER — Other Ambulatory Visit
Admission: RE | Admit: 2024-02-25 | Discharge: 2024-02-25 | Disposition: A | Discharge status: Home / Self Care | Source: Ambulatory Visit | Attending: Internal Medicine | Admitting: Internal Medicine

## 2024-02-25 VITALS — BP 160/83 | HR 52 | Wt 217.0 lb

## 2024-02-25 DIAGNOSIS — I5032 Chronic diastolic (congestive) heart failure: Secondary | ICD-10-CM | POA: Insufficient documentation

## 2024-02-25 DIAGNOSIS — I480 Paroxysmal atrial fibrillation: Secondary | ICD-10-CM

## 2024-02-25 DIAGNOSIS — N183 Chronic kidney disease, stage 3 unspecified: Secondary | ICD-10-CM | POA: Diagnosis not present

## 2024-02-25 DIAGNOSIS — I2510 Atherosclerotic heart disease of native coronary artery without angina pectoris: Secondary | ICD-10-CM | POA: Diagnosis not present

## 2024-02-25 DIAGNOSIS — I10 Essential (primary) hypertension: Secondary | ICD-10-CM | POA: Diagnosis not present

## 2024-02-25 DIAGNOSIS — I5022 Chronic systolic (congestive) heart failure: Secondary | ICD-10-CM

## 2024-02-25 LAB — CBC
HCT: 43.8 % (ref 39.0–52.0)
Hemoglobin: 14.3 g/dL (ref 13.0–17.0)
MCH: 30.5 pg (ref 26.0–34.0)
MCHC: 32.6 g/dL (ref 30.0–36.0)
MCV: 93.4 fL (ref 80.0–100.0)
Platelets: 145 10*3/uL — ABNORMAL LOW (ref 150–400)
RBC: 4.69 MIL/uL (ref 4.22–5.81)
RDW: 14.8 % (ref 11.5–15.5)
WBC: 5.4 10*3/uL (ref 4.0–10.5)
nRBC: 0 % (ref 0.0–0.2)

## 2024-02-25 LAB — COMPREHENSIVE METABOLIC PANEL WITH GFR
ALT: 17 U/L (ref 0–44)
AST: 17 U/L (ref 15–41)
Albumin: 4.3 g/dL (ref 3.5–5.0)
Alkaline Phosphatase: 61 U/L (ref 38–126)
Anion gap: 7 (ref 5–15)
BUN: 29 mg/dL — ABNORMAL HIGH (ref 8–23)
CO2: 26 mmol/L (ref 22–32)
Calcium: 9.1 mg/dL (ref 8.9–10.3)
Chloride: 108 mmol/L (ref 98–111)
Creatinine, Ser: 1.62 mg/dL — ABNORMAL HIGH (ref 0.61–1.24)
GFR, Estimated: 42 mL/min — ABNORMAL LOW (ref >60)
Glucose, Bld: 113 mg/dL — ABNORMAL HIGH (ref 70–99)
Potassium: 4.7 mmol/L (ref 3.5–5.1)
Sodium: 141 mmol/L (ref 135–145)
Total Bilirubin: 1.3 mg/dL — ABNORMAL HIGH (ref 0.0–1.2)
Total Protein: 6.9 g/dL (ref 6.5–8.1)

## 2024-02-25 LAB — LIPID PANEL
Cholesterol: 112 mg/dL (ref 0–200)
HDL: 40 mg/dL — ABNORMAL LOW (ref >40)
LDL Cholesterol: 53 mg/dL (ref 0–99)
Total CHOL/HDL Ratio: 2.8 ratio
Triglycerides: 97 mg/dL (ref <150)
VLDL: 19 mg/dL (ref 0–40)

## 2024-02-25 LAB — BRAIN NATRIURETIC PEPTIDE: B Natriuretic Peptide: 708.7 pg/mL — ABNORMAL HIGH (ref 0.0–100.0)

## 2024-02-25 MED ORDER — ALLOPURINOL 100 MG PO TABS: 100.0000 mg | ORAL_TABLET | Freq: Every day | ORAL | 3 refills | Status: AC

## 2024-02-25 MED ORDER — APIXABAN 2.5 MG PO TABS: 2.5000 mg | ORAL_TABLET | Freq: Two times a day (BID) | ORAL | 11 refills | Status: AC

## 2024-02-25 NOTE — Patient Instructions (Signed)
 Medication Changes:  STOP Amiodarone   START Allopurinol  100mg  (1 tab) daily  DECREASE Eliquis  2.5mg  (1 tab) two times daily  Lab Work:  Go over to the [facility]. Go pass the gift shop and have your blood work completed.  We will only call you if the results are abnormal or if the provider would like to make medication changes.  Follow-Up in: Please follow up with the [facility] in 6 months with Dr. Julane Ny. We do not have that schedule yet. Please call us  in November in order to schedule your appointment for December.  At the [facility], you and your health needs are our priority. We have a designated team specialized in the treatment of Heart Failure. This Care Team includes your primary Heart Failure Specialized Cardiologist (physician), Advanced Practice Providers (APPs- Physician Assistants and Nurse Practitioners), and Pharmacist who all work together to provide you with the care you need, when you need it.   You may see any of the following providers on your designated Care Team at your next follow up:  Dr. Jules Oar Dr. Peder Bourdon Dr. Alwin Baars Dr. Judyth Nunnery Shawnee Dellen, FNP Bevely Brush, RPH-CPP  Please be sure to bring in all your medications bottles to every appointment.   Need to Contact Us :  If you have any questions or concerns before your next appointment please send us  a message through [place] or call our office at [phone].    TO LEAVE A MESSAGE FOR THE NURSE SELECT OPTION 2, PLEASE LEAVE A MESSAGE INCLUDING: YOUR NAME DATE OF BIRTH CALL BACK NUMBER REASON FOR CALL**this is important as we prioritize the call backs  YOU WILL RECEIVE A CALL BACK THE SAME DAY AS LONG AS YOU CALL BEFORE 4:00 PM

## 2024-02-25 NOTE — Progress Notes (Signed)
 [facility] CLINIC NOTE   Primary Care: Lyle San, MD Primary Cardiologist: Dr. Parks Bollman HF Cardiologist: Dr. Julane Ny  HPI: [name] Goodman is an 83 y.o.male with CAD s/p CABG 08/23, HFmEF, atrial fibrillation, HTN.    Underwent CABG X 5 in 8/23. Intra-op TEE with LVEF 40-45%. Had postoperative AF and developed bradycardia on amiodarone  drip. Converted to SR.    Readmitted on 05/18/22 with segmental and subsegmental PE RML/RUL, CHF, left pleural effusion s/p thoracentesis and recurrent atrial fibrillation. Echo EF 25-30%, mildly RV HK. Converted to SR during admit. PVCs also noted on tele. Had early graft failure with occlusion of SVG to Diag and SVG to RCA occluded. LIMA to LAD and radial jump graft to OM and RPLV patent. Underwent successful staged PCI/DES to distal RCA and PCI/DES p Cx into OM1.   Zio 9/23 showed AF/AFL burden 84% with PVC burden of 18.6%. Started on amiodarone .  F/u 10/23, stable NYHA II-early III and volume OK.  Zio 11/23 Continuous AFL (avg 76 bpm) PVC 10.7% (2 morphologies 6.0% & 4.7%)   Echo 08/27/22 EF 25-30% mod-sev MR.  Had DC-CV 09/02/22   cMRI 3/24: EF 43% RVEF 40% Mod MR. Coronary pattern LGE in the mid inferoseptal wall (>50% wall thickness, unlikely to be viable) and the basal inferolateral wall (<50% wall thickness, likely viable).  Admitted 03/16/23 with volume overload. Diuresed with IV lasix . Lasix  started MWF  Echo 3/25 EF 35-40% RV ok. Mild MR Personally reviewed  Here for f/u. Feels great. Walking 5 miles per day. Endurance improved. Taking BP every day and averaging 140/70. Taking lasix  about 3x/week and still complaining about. No CP, edema, orthopnea or PND.     Past Medical History:  Diagnosis Date   Arthritis    Atrial fibrillation (HCC)    Chronic systolic (congestive) heart failure (HCC)    History of kidney stones    Hyperlipidemia    Hypertension    Nocturia    S/P CABG (coronary artery bypass graft)      Current Outpatient Medications  Medication Sig Dispense Refill   allopurinol  (ZYLOPRIM ) 100 MG tablet Take 2 tablets (200 mg total) by mouth daily. 60 tablet 6   amiodarone  (PACERONE ) 200 MG tablet TAKE ONE TABLET BY MOUTH ONE TIME DAILY (Patient taking differently: Take 100 mg by mouth daily.) 30 tablet 1   atorvastatin  (LIPITOR ) 80 MG tablet TAKE ONE TABLET BY MOUTH ONE TIME DAILY 30 tablet 7   cyanocobalamin (VITAMIN B12) 1000 MCG tablet Take 1,000 mcg by mouth daily.     ELIQUIS  5 MG TABS tablet TAKE ONE TABLET BY MOUTH TWICE A DAY 60 tablet 6   empagliflozin  (JARDIANCE ) 10 MG TABS tablet Take 1 tablet (10 mg total) by mouth daily. 30 tablet 7   furosemide  (LASIX ) 40 MG tablet Take  40 mg daily on Mondays, Wednesdays and Friday 30 tablet 6   metoprolol  succinate (TOPROL -XL) 50 MG 24 hr tablet Take 1 tablet (50 mg total) by mouth daily. Take with or immediately following a meal. 30 tablet 6   sacubitril -valsartan  (ENTRESTO ) 97-103 MG Take 1 tablet by mouth 2 (two) times daily. 60 tablet 6   No current facility-administered medications for this visit.   No Known Allergies  Social History   Socioeconomic History   Marital status: Divorced    Spouse name: Not on file   Number of children: 3   Years of education: Not on file   Highest education level: Master's degree (e.g., MA, MS, MEng,  MEd, MSW, MBA)  Occupational History   Occupation: retired Psychologist, occupational at [employer]  Tobacco Use   Smoking status: Former    Current packs/day: 0.00    Average packs/day: 1 pack/day for 15.0 years (15.0 ttl pk-yrs)    Types: Cigarettes    Start date: 07/16/1960    Quit date: 07/17/1975    Years since quitting: 48.6    Passive exposure: Past   Smokeless tobacco: Never  Vaping Use   Vaping status: Never Used  Substance and Sexual Activity   Alcohol use: No   Drug use: No   Sexual activity: Not on file  Other Topics Concern   Not on file  Social History Narrative   Lives alone   Social Drivers  of Health   Financial Resource Strain: Low Risk  (05/20/2022)   Overall Financial Resource Strain (CARDIA)    Difficulty of Paying Living Expenses: Not hard at all  Food Insecurity: No Food Insecurity (05/20/2022)   Hunger Vital Sign    Worried About Running Out of Food in the Last Year: Never true    Ran Out of Food in the Last Year: Never true  Transportation Needs: No Transportation Needs (05/20/2022)   PRAPARE - Administrator, Civil Service (Medical): No    Lack of Transportation (Non-Medical): No  Physical Activity: Not on file  Stress: Not on file  Social Connections: Not on file  Intimate Partner Violence: Not on file   Family History  Problem Relation Age of Onset   Hypertension Father    Cancer Father    BP (!) 160/83   Pulse (!) 52   Wt 217 lb (98.4 kg)   SpO2 100%   BMI 28.63 kg/m   Wt Readings from Last 3 Encounters:  02/25/24 217 lb (98.4 kg)  12/04/23 219 lb (99.3 kg)  11/18/23 218 lb 12.8 oz (99.2 kg)   PHYSICAL EXAM: General:  Well appearing. No resp difficulty HEENT: normal Neck: supple. no JVD. Carotids 2+ bilat; no bruits. No lymphadenopathy or thryomegaly appreciated. Cor: PMI nondisplaced. Regular rate & rhythm. No rubs, gallops or murmurs. Lungs: clear Abdomen: soft, nontender, nondistended. No hepatosplenomegaly. No bruits or masses. Good bowel sounds. Extremities: no cyanosis, clubbing, rash, edema Neuro: alert & orientedx3, cranial nerves grossly intact. moves all 4 extremities w/o difficulty. Affect pleasant x3, cranial nerves grossly intact. moves all 4 extremities w/o difficulty. Affect pleasant   ASSESSMENT & PLAN:  Chronic systolic CHF - pre-CABG LV gram (7/23): EF 35-40%  - Intraop TEE 05/08/22: EF 40-45% - Limited echo 05/19/22: EF 25-30%, RV mildly reduced - S/p PCI/DES to RCA and PCI/DES to p Lcx into OM1 9/23 (see below).  - Echo 08/27/22 EF 25-30% moderate to severe MR. - cMRI 3/24: EF 43% RVEF 40% Mod MR. Coronary  pattern LGE in the mid inferoseptal wall (>50% wall thickness, unlikely to be viable) and the basal inferolateral wall (<50% wall thickness, likely viable). - suspect primarily iCM. Had early graft failure post CABG. vs PVC  - Echo 03/16/23: EF 35-40%  - Echo 3/25 EF 35-40% RV ok. Mild MR Personally reviewed - Stable NYHA I-II  Volume status ok. Continue lasix  40mg  at least 3x/week - Continue Entresto  97/103 bid  - Off spiro due to AKI and hyperkalemia - Continue Jardiance  10 mg daily. - Continue Toprol  50  - Labs today  2. CAD - S/p CABG X 5 (LIMA to LAD, SVG to PDA, SVG to D1, sequential left radial artery to OM  and PL) 05/08/22 - Early graft failure with occlusion of SVG to D1 and SVG to RCA. LIMA to LAD and radial graft to OM1 and RPLV patent - S/p PCI/DES to RCA and PCI/DES p Lcx into OM1 05/27/22. - No s/s angina - Continue Eliquis . - Continue statin. Check lipids  3. Persistent atrial fibrillation/AFL  - Noted post CABG. - Had recurrence AF in 9/24 - Zio 2 week (9/23) showed mostly AF/AFL 84% burden, frequent PVCs (18.6%) - Zio 11/23 100% AFL  - s/p DC-CV 12/23 - Zio 5/24 No AF Rare PVCs - Has seen Dr. Arlester Ladd and discussed possible ablation in 4/24. Has cancelled.  - In NSR. Will stop amio and see how he does.  - With age > 80 and SCr > 1.5 decrease to 2.5 bid    4. Frequent PVCs - Zio 2 week (9/23): PVCs 18.6% - Previously discussed sleep study, he was not interested - Repeat Zio 2 11/23 10.7% PVCs - remains on amio.Continue amio 100mg  daily - Zio 5/24 No AF Rare PVCs   5. HTN - Blood pressure mildly elevated  6. CKD IIIa - Baseline Scr 1.3-1.6 - labs today   7. Mitral regurgitation - moderate to severe on previosus echo - likely ischemic (vs functional) - cMRI 3/24 moderate MR - echo 6/24 mild to moderate MR  - Echo 3/25 mild MR  8. Gout - No recent flares after starting allopurinol  - Out of allopurinol . Will  refill    Jules Oar, MD  11:47  AM

## 2024-02-25 NOTE — Addendum Note (Signed)
 Addended by: Autry Legions A on: 02/25/2024 12:27 PM   Modules accepted: Orders

## 2024-03-11 ENCOUNTER — Other Ambulatory Visit: Payer: Self-pay

## 2024-03-11 MED ORDER — METOPROLOL SUCCINATE ER 50 MG PO TB24: 50.0000 mg | ORAL_TABLET | Freq: Every day | ORAL | 6 refills | Status: AC

## 2024-03-18 ENCOUNTER — Encounter: Admitting: Internal Medicine

## 2024-05-12 ENCOUNTER — Other Ambulatory Visit (HOSPITAL_COMMUNITY): Payer: Self-pay | Admitting: Family Medicine

## 2024-05-27 ENCOUNTER — Encounter: Payer: Self-pay | Admitting: Ophthalmology

## 2024-05-27 NOTE — Anesthesia Preprocedure Evaluation (Addendum)
 Anesthesia Evaluation  Patient identified by MRN, date of birth, ID band Patient awake    Reviewed: Allergy & Precautions, H&P , NPO status , Patient's Chart, lab work & pertinent test results  Airway Mallampati: III  TM Distance: <3 FB Neck ROM: Full    Dental no notable dental hx.    Pulmonary neg pulmonary ROS, former smoker   Pulmonary exam normal breath sounds clear to auscultation       Cardiovascular hypertension, + CAD and +CHF  negative cardio ROS Normal cardiovascular exam Rhythm:Regular Rate:Normal  05-27-22 cath  CONCLUSIONS:  Stent 85% distal RCA reducing stenosis to 0% with TIMI grade III flow.  RFR of proximal to mid disease in RCA was not hemodynamically significant (0.91).  Stent proximal circumflex into large first obtuse marginal reducing 80 and 60% stenoses to 0% with TIMI grade III flow.  Circumflex and left main proximal to the implanted stent was interrogated with RFR post stent and was not hemodynamically significant at 0.9.  12-15-23 echo  1. Left ventricular ejection fraction, by estimation, is 35 to 40%. Left  ventricular ejection fraction by 3D volume is 36 %. The left ventricle has  moderately decreased function. The left ventricle has no regional wall  motion abnormalities. There is  mild left ventricular hypertrophy. Left ventricular diastolic parameters  are consistent with Grade I diastolic dysfunction (impaired relaxation).  The average left ventricular global longitudinal strain is -12.5 %. The  global longitudinal strain is  abnormal.   2. Right ventricular systolic function is normal. The right ventricular  size is normal.   3. Left atrial size was moderately dilated.   4. Right atrial size was mildly dilated.   5. The mitral valve is normal in structure. Mild mitral valve  regurgitation. No evidence of mitral stenosis.   6. The aortic valve is calcified. Aortic valve regurgitation is  trivial.  Aortic valve sclerosis/calcification is present, without any evidence of  aortic stenosis.     Neuro/Psych negative neurological ROS  negative psych ROS   GI/Hepatic negative GI ROS, Neg liver ROS,,,  Endo/Other  negative endocrine ROS    Renal/GU Renal diseasenegative Renal ROS  negative genitourinary   Musculoskeletal negative musculoskeletal ROS (+) Arthritis ,    Abdominal   Peds negative pediatric ROS (+)  Hematology negative hematology ROS (+)   Anesthesia Other Findings Hypertension  Arthritis History of kidney stones  Nocturia Hyperlipidemia Chronic systolic (congestive) heart failure Atrial fibrillation (HCC) S/P CABG x 5  History of pulmonary embolus (PE) Mild mitral regurgitation by prior echocardiogram  Grade I diastolic dysfunction Cardiac left ventricular ejection fraction 30-35 percent      Reproductive/Obstetrics negative OB ROS                              Anesthesia Physical Anesthesia Plan  ASA: 4  Anesthesia Plan: MAC   Post-op Pain Management:    Induction: Intravenous  PONV Risk Score and Plan:   Airway Management Planned: Natural Airway and Nasal Cannula  Additional Equipment:   Intra-op Plan:   Post-operative Plan:   Informed Consent: I have reviewed the patients History and Physical, chart, labs and discussed the procedure including the risks, benefits and alternatives for the proposed anesthesia with the patient or authorized representative who has indicated his/her understanding and acceptance.     Dental Advisory Given  Plan Discussed with: Anesthesiologist, CRNA and Surgeon  Anesthesia Plan Comments: (Patient consented for  risks of anesthesia including but not limited to:  - adverse reactions to medications - damage to eyes, teeth, lips or other oral mucosa - nerve damage due to positioning  - sore throat or hoarseness - Damage to heart, brain, nerves, lungs, other parts of  body or loss of life  Patient voiced understanding and assent.)         Anesthesia Quick Evaluation

## 2024-06-02 NOTE — Discharge Instructions (Signed)

## 2024-06-07 ENCOUNTER — Ambulatory Visit: Payer: Self-pay | Admitting: Anesthesiology

## 2024-06-07 ENCOUNTER — Encounter
Admission: RE | Disposition: A | Discharge status: Home / Self Care | Payer: Self-pay | Source: Home / Self Care | Attending: Ophthalmology

## 2024-06-07 ENCOUNTER — Ambulatory Visit
Admission: RE | Admit: 2024-06-07 | Discharge: 2024-06-07 | Disposition: A | Discharge status: Home / Self Care | Attending: Ophthalmology | Admitting: Ophthalmology

## 2024-06-07 ENCOUNTER — Encounter: Payer: Self-pay | Admitting: Ophthalmology

## 2024-06-07 ENCOUNTER — Other Ambulatory Visit: Payer: Self-pay

## 2024-06-07 DIAGNOSIS — I2510 Atherosclerotic heart disease of native coronary artery without angina pectoris: Secondary | ICD-10-CM | POA: Diagnosis not present

## 2024-06-07 DIAGNOSIS — H2512 Age-related nuclear cataract, left eye: Secondary | ICD-10-CM | POA: Insufficient documentation

## 2024-06-07 DIAGNOSIS — I110 Hypertensive heart disease with heart failure: Secondary | ICD-10-CM | POA: Diagnosis not present

## 2024-06-07 DIAGNOSIS — Z87891 Personal history of nicotine dependence: Secondary | ICD-10-CM | POA: Insufficient documentation

## 2024-06-07 DIAGNOSIS — I5022 Chronic systolic (congestive) heart failure: Secondary | ICD-10-CM | POA: Diagnosis not present

## 2024-06-07 HISTORY — DX: Presence of aortocoronary bypass graft: Z95.1

## 2024-06-07 HISTORY — DX: Abnormal findings on diagnostic imaging of heart and coronary circulation: R93.1

## 2024-06-07 HISTORY — DX: Other ill-defined heart diseases: I51.89

## 2024-06-07 HISTORY — DX: Nonrheumatic mitral (valve) insufficiency: I34.0

## 2024-06-07 HISTORY — DX: Personal history of pulmonary embolism: Z86.711

## 2024-06-07 SURGERY — PHACOEMULSIFICATION, CATARACT, WITH IOL INSERTION
Anesthesia: Monitor Anesthesia Care | Site: Eye | Laterality: Left

## 2024-06-07 MED ORDER — TETRACAINE HCL 0.5 % OP SOLN
OPHTHALMIC | Status: AC
  Filled 2024-06-07: qty 4

## 2024-06-07 MED ORDER — LACTATED RINGERS IV SOLN
INTRAVENOUS | Status: DC
Start: 2024-06-07 — End: 2024-06-07

## 2024-06-07 MED ORDER — MIDAZOLAM HCL 2 MG/2ML IJ SOLN
INTRAMUSCULAR | Status: DC | PRN
  Administered 2024-06-07: 1 mg via INTRAVENOUS

## 2024-06-07 MED ORDER — ARMC OPHTHALMIC DILATING DROPS
1.0000 | OPHTHALMIC | Status: DC | PRN
  Administered 2024-06-07 (×3): 1 via OPHTHALMIC

## 2024-06-07 MED ORDER — FENTANYL CITRATE (PF) 100 MCG/2ML IJ SOLN
INTRAMUSCULAR | Status: DC | PRN
  Administered 2024-06-07: 50 ug via INTRAVENOUS

## 2024-06-07 MED ORDER — MOXIFLOXACIN HCL 0.5 % OP SOLN
OPHTHALMIC | Status: DC | PRN
  Administered 2024-06-07: .2 mL via OPHTHALMIC

## 2024-06-07 MED ORDER — SIGHTPATH DOSE#1 BSS IO SOLN
INTRAOCULAR | Status: DC | PRN
  Administered 2024-06-07: 62 mL via OPHTHALMIC

## 2024-06-07 MED ORDER — SIGHTPATH DOSE#1 BSS IO SOLN
INTRAOCULAR | Status: DC | PRN
  Administered 2024-06-07: 15 mL via INTRAOCULAR

## 2024-06-07 MED ORDER — SIGHTPATH DOSE#1 NA CHONDROIT SULF-NA HYALURON 40-17 MG/ML IO SOLN
INTRAOCULAR | Status: DC | PRN
  Administered 2024-06-07: 1 mL via INTRAOCULAR

## 2024-06-07 MED ORDER — LIDOCAINE HCL (PF) 2 % IJ SOLN
INTRAOCULAR | Status: DC | PRN
  Administered 2024-06-07: 2 mL

## 2024-06-07 MED ORDER — MIDAZOLAM HCL 2 MG/2ML IJ SOLN
INTRAMUSCULAR | Status: AC
  Filled 2024-06-07: qty 2

## 2024-06-07 MED ORDER — ARMC OPHTHALMIC DILATING DROPS
OPHTHALMIC | Status: AC
  Filled 2024-06-07: qty 0.5

## 2024-06-07 MED ORDER — BRIMONIDINE TARTRATE-TIMOLOL 0.2-0.5 % OP SOLN
OPHTHALMIC | Status: DC | PRN
  Administered 2024-06-07: 1 [drp] via OPHTHALMIC

## 2024-06-07 MED ORDER — TETRACAINE HCL 0.5 % OP SOLN
1.0000 [drp] | OPHTHALMIC | Status: DC | PRN
Start: 2024-06-07 — End: 2024-06-07
  Administered 2024-06-07 (×3): 1 [drp] via OPHTHALMIC

## 2024-06-07 MED ORDER — FENTANYL CITRATE (PF) 100 MCG/2ML IJ SOLN
INTRAMUSCULAR | Status: AC
  Filled 2024-06-07: qty 2

## 2024-06-07 SURGICAL SUPPLY — 10 items
CANNULA ANT/CHMB 27G (MISCELLANEOUS) ×1 IMPLANT
CYSTOTOME ANGL RVRS SHRT 25G (CUTTER) ×1 IMPLANT
FEE CATARACT SUITE SIGHTPATH (MISCELLANEOUS) ×1 IMPLANT
GLOVE BIOGEL PI IND STRL 8 (GLOVE) ×1 IMPLANT
GLOVE SURG LX STRL 8.0 MICRO (GLOVE) ×1 IMPLANT
GLOVE SURG SYN 6.5 PF PI BL (GLOVE) ×1 IMPLANT
LENS IOL TECNIS EYHANCE 20.0 (Intraocular Lens) IMPLANT
NDL FILTER BLUNT 18X1 1/2 (NEEDLE) ×1 IMPLANT
NEEDLE FILTER BLUNT 18X1 1/2 (NEEDLE) ×1 IMPLANT
SYR 3ML LL SCALE MARK (SYRINGE) ×1 IMPLANT

## 2024-06-07 NOTE — Transfer of Care (Signed)
 Immediate Anesthesia Transfer of Care Note  Patient: [name] Goodman  Procedure(s) Performed: PHACOEMULSIFICATION, CATARACT, WITH IOL INSERTION 7.97 00:44.3 (Left: Eye)  Patient Location: PACU  Anesthesia Type: MAC  Level of Consciousness: awake, alert  and patient cooperative  Airway and Oxygen Therapy: Patient Spontanous Breathing and Patient connected to supplemental oxygen  Post-op Assessment: Post-op Vital signs reviewed, Patient's Cardiovascular Status Stable, Respiratory Function Stable, Patent Airway and No signs of Nausea or vomiting  Post-op Vital Signs: Reviewed and stable  Complications: No notable events documented.

## 2024-06-07 NOTE — Anesthesia Postprocedure Evaluation (Signed)
 Anesthesia Post Note  Patient: [name] Goodman  Procedure(s) Performed: PHACOEMULSIFICATION, CATARACT, WITH IOL INSERTION 7.97 00:44.3 (Left: Eye)  Patient location during evaluation: PACU Anesthesia Type: MAC Level of consciousness: awake and alert Pain management: pain level controlled Vital Signs Assessment: post-procedure vital signs reviewed and stable Respiratory status: spontaneous breathing, nonlabored ventilation, respiratory function stable and patient connected to nasal cannula oxygen Cardiovascular status: stable and blood pressure returned to baseline Postop Assessment: no apparent nausea or vomiting Anesthetic complications: no   No notable events documented.   Last Vitals:  Vitals:   06/07/24 1025 06/07/24 1028  BP: (!) 134/103 (!) 158/88  Pulse: 60 (!) 59  Resp: 18 19  Temp: (!) 36.1 C (!) 36.1 C  SpO2:  98%    Last Pain:  Vitals:   06/07/24 1028  TempSrc:   PainSc: 0-No pain                 Franceen Erisman C Vila Dory

## 2024-06-07 NOTE — H&P (Signed)
 [facility]   Primary Care Physician:  Valora Agent, MD Ophthalmologist: Dr. Elsie Carmine  Pre-Procedure History & Physical: HPI:  [name] Goodman is a 83 y.o. male here for cataract surgery.   Past Medical History:  Diagnosis Date   Arthritis    Atrial fibrillation (HCC)    Cardiac left ventricular ejection fraction 30-35 percent    Chronic systolic (congestive) heart failure (HCC)    Grade I diastolic dysfunction    History of kidney stones    History of pulmonary embolus (PE)    Hyperlipidemia    Hypertension    Mild mitral regurgitation by prior echocardiogram    Nocturia    S/P CABG (coronary artery bypass graft)    S/P CABG x 5     Past Surgical History:  Procedure Laterality Date   APPENDECTOMY     CARDIOVERSION N/A 09/02/2022   Procedure: CARDIOVERSION;  Surgeon: Cherrie Toribio SAUNDERS, MD;  Location: [facility] ENDOSCOPY;  Service: Cardiovascular;  Laterality: N/A;   CATARACT EXTRACTION W/PHACO Right 11/27/2020   Procedure: CATARACT EXTRACTION PHACO AND INTRAOCULAR LENS PLACEMENT (IOC) RIGHT VIVITY LENS toric 6.31 00.41.8;  Surgeon: Carmine Elsie, MD;  Location: [facility] SURGERY CN[facility];  Service: Ophthalmology;  Laterality: Right;   CORONARY ARTERY BYPASS GRAFT N/A 05/08/2022   Procedure: CORONARY ARTERY BYPASS GRAFTING (CABG) TIMES FIVE USING ENDOSCOPICALLY HARVESTED RIGHT GREATER SAPHENOUS VEIN AND LEFT OPEN RADIAL HARVEST VEIN AND LEFT INTERNALLY MAMMARY ARTERY.;  Surgeon: Kerrin Elspeth BROCKS, MD;  Location: [facility] OR;  Service: Open Heart Surgery;  Laterality: N/A;   CORONARY PRESSURE/FFR STUDY N/A 05/27/2022   Procedure: INTRAVASCULAR PRESSURE WIRE/FFR STUDY;  Surgeon: Claudene Victory LELON, MD;  Location: [facility] INVASIVE CV LAB;  Service: Cardiovascular;  Laterality: N/A;   CORONARY STENT INTERVENTION N/A 05/27/2022   Procedure: CORONARY STENT INTERVENTION;  Surgeon: Claudene Victory LELON, MD;  Location: [facility] INVASIVE CV LAB;  Service: Cardiovascular;  Laterality: N/A;   IR  THORACENTESIS ASP PLEURAL SPACE W/IMG GUIDE  05/19/2022   JOINT REPLACEMENT     KNEE ARTHROSCOPY Left    KNEE ARTHROSCOPY WITH LATERAL MENISECTOMY Right 11/13/2020   Procedure: Right knee arthroscopy, partial medial and lateral meniscectomy;  Surgeon: Kathlynn Sharper, MD;  Location: [facility] ORS;  Service: Orthopedics;  Laterality: Right;   LEFT HEART CATH AND CORONARY ANGIOGRAPHY N/A 04/16/2022   Procedure: LEFT HEART CATH AND CORONARY ANGIOGRAPHY;  Surgeon: Ammon Blunt, MD;  Location: [facility] INVASIVE CV LAB;  Service: Cardiovascular;  Laterality: N/A;   POLYPECTOMY     WITH COLONOSCOPY   RADIAL ARTERY HARVEST Left 05/08/2022   Procedure: RADIAL ARTERY HARVEST;  Surgeon: Kerrin Elspeth BROCKS, MD;  Location: [facility] OR;  Service: Open Heart Surgery;  Laterality: Left;   RIGHT/LEFT HEART CATH AND CORONARY/GRAFT ANGIOGRAPHY N/A 05/23/2022   Procedure: RIGHT/LEFT HEART CATH AND CORONARY/GRAFT ANGIOGRAPHY;  Surgeon: Claudene Victory LELON, MD;  Location: [facility] INVASIVE CV LAB;  Service: Cardiovascular;  Laterality: N/A;   TEE WITHOUT CARDIOVERSION N/A 05/08/2022   Procedure: TRANSESOPHAGEAL ECHOCARDIOGRAM (TEE);  Surgeon: Kerrin Elspeth BROCKS, MD;  Location: [facility] OR;  Service: Open Heart Surgery;  Laterality: N/A;   TOTAL KNEE ARTHROPLASTY Left 07/23/2015   Procedure: LEFT TOTAL KNEE ARTHROPLASTY;  Surgeon: Dempsey Moan, MD;  Location: [facility] ORS;  Service: Orthopedics;  Laterality: Left;   TOTAL KNEE ARTHROPLASTY Right 06/11/2021   Procedure: TOTAL KNEE ARTHROPLASTY;  Surgeon: Kathlynn Sharper, MD;  Location: [facility] ORS;  Service: Orthopedics;  Laterality: Right;   TRANSURETHRAL RESECTION OF PROSTATE  ?2015   URETEROSCOPY WITH  HOLMIUM LASER LITHOTRIPSY      Prior to Admission medications   Medication Sig Start Date End Date Taking? Authorizing Provider  allopurinol  (ZYLOPRIM ) 100 MG tablet Take 1 tablet (100 mg total) by mouth daily. 02/25/24  Yes Bensimhon, Toribio SAUNDERS, MD  apixaban  (ELIQUIS ) 2.5 MG TABS tablet Take 1 tablet  (2.5 mg total) by mouth 2 (two) times daily. 02/25/24  Yes Bensimhon, Toribio SAUNDERS, MD  atorvastatin  (LIPITOR ) 80 MG tablet TAKE ONE TABLET BY MOUTH ONE TIME DAILY 05/12/24  Yes Bernard, Harlene HERO, FNP  cyanocobalamin (VITAMIN B12) 1000 MCG tablet Take 1,000 mcg by mouth daily.   Yes [provider]  empagliflozin  (JARDIANCE ) 10 MG TABS tablet Take 1 tablet (10 mg total) by mouth daily. 07/07/22  Yes Glena Harlene HERO, FNP  furosemide  (LASIX ) 40 MG tablet Take  40 mg daily on Mondays, Wednesdays and Friday 11/18/23  Yes Bensimhon, Toribio SAUNDERS, MD  metoprolol  succinate (TOPROL -XL) 50 MG 24 hr tablet Take 1 tablet (50 mg total) by mouth daily. Take with or immediately following a meal. 03/11/24  Yes Bensimhon, Toribio SAUNDERS, MD  sacubitril -valsartan  (ENTRESTO ) 97-103 MG Take 1 tablet by mouth 2 (two) times daily. 11/18/23  Yes Bensimhon, Toribio SAUNDERS, MD    Allergies as of 05/25/2024   (No Known Allergies)    Family History  Problem Relation Age of Onset   Hypertension Father    Cancer Father     Social History   Socioeconomic History   Marital status: Divorced    Spouse name: Not on file   Number of children: 3   Years of education: Not on file   Highest education level: Master's degree (e.g., MA, MS, MEng, MEd, MSW, MBA)  Occupational History   Occupation: retired Psychologist, occupational at [employer]  Tobacco Use   Smoking status: Former    Current packs/day: 0.00    Average packs/day: 1 pack/day for 15.0 years (15.0 ttl pk-yrs)    Types: Cigarettes    Start date: 07/16/1960    Quit date: 07/17/1975    Years since quitting: 48.9    Passive exposure: Past   Smokeless tobacco: Never  Vaping Use   Vaping status: Never Used  Substance and Sexual Activity   Alcohol use: No   Drug use: No   Sexual activity: Not on file  Other Topics Concern   Not on file  Social History Narrative   Lives alone   Social Drivers of Health   Financial Resource Strain: Low Risk  (05/20/2022)   Overall Financial Resource  Strain (CARDIA)    Difficulty of Paying Living Expenses: Not hard at all  Food Insecurity: No Food Insecurity (05/20/2022)   Hunger Vital Sign    Worried About Running Out of Food in the Last Year: Never true    Ran Out of Food in the Last Year: Never true  Transportation Needs: No Transportation Needs (05/20/2022)   PRAPARE - Administrator, Civil Service (Medical): No    Lack of Transportation (Non-Medical): No  Physical Activity: Not on file  Stress: Not on file  Social Connections: Not on file  Intimate Partner Violence: Not on file    Review of Systems: See HPI, otherwise negative ROS  Physical Exam: BP (!) 156/77   Pulse (!) 55   Temp 97.9 F (36.6 C) (Temporal)   Resp 20   Ht 6' 1 (1.854 m)   Wt 95.7 kg   SpO2 100%   BMI 27.84 kg/m  General:  Alert, cooperative. Head:  Normocephalic and atraumatic. Respiratory:  Normal work of breathing. Cardiovascular:  NAD  Impression/Plan: [name] Goodman is here for cataract surgery.  Risks, benefits, limitations, and alternatives regarding cataract surgery have been reviewed with the patient.  Questions have been answered.  All parties agreeable.   Elsie Carmine, MD  06/07/2024, 10:01 AM

## 2024-06-07 NOTE — Op Note (Signed)
 PREOPERATIVE DIAGNOSIS:  Nuclear sclerotic cataract of the left eye.   POSTOPERATIVE DIAGNOSIS:  Nuclear sclerotic cataract of the left eye.   OPERATIVE PROCEDURE:ORPROCALL@   SURGEON:  Elsie Carmine, MD.   ANESTHESIA:  Anesthesiologist: Ola Donny BROCKS, MD CRNA: Jahoo, Sonia, CRNA  1.      Managed anesthesia care. 2.     0.48ml of Shugarcaine was instilled following the paracentesis   COMPLICATIONS:  None.   TECHNIQUE:   Stop and chop   DESCRIPTION OF PROCEDURE:  The patient was examined and consented in the preoperative holding area where the aforementioned topical anesthesia was applied to the left eye and then brought back to the Operating Room where the left eye was prepped and draped in the usual sterile ophthalmic fashion and a lid speculum was placed. A paracentesis was created with the side port blade and the anterior chamber was filled with viscoelastic. A near clear corneal incision was performed with the steel keratome. A continuous curvilinear capsulorrhexis was performed with a cystotome followed by the capsulorrhexis forceps. Hydrodissection and hydrodelineation were carried out with BSS on a blunt cannula. The lens was removed in a stop and chop  technique and the remaining cortical material was removed with the irrigation-aspiration handpiece. The capsular bag was inflated with viscoelastic and the intraocular lens was placed in the capsular bag without complication. The remaining viscoelastic was removed from the eye with the irrigation-aspiration handpiece. The wounds were hydrated. The anterior chamber was flushed with BSS and the eye was inflated to physiologic pressure. 0.53ml Vigamox  was placed in the anterior chamber. The wounds were found to be water  tight. The eye was dressed with Combigan . The patient was given protective glasses to wear throughout the day and a shield with which to sleep tonight. The patient was also given drops with which to begin a drop regimen  today and will follow-up with me in one day. Implant Name Type Inv. Item Serial No. Manufacturer Lot No. LRB No. Used Action  LENS IOL TECNIS EYHANCE 20.0 - [id number] Intraocular Lens LENS IOL TECNIS EYHANCE 20.0 [id number] [employer]  Left 1 Implanted    Procedure(s): PHACOEMULSIFICATION, CATARACT, WITH IOL INSERTION 7.97 00:44.3 (Left)  Electronically signed: Elsie Carmine 06/07/2024 10:23 AM

## 2024-07-16 ENCOUNTER — Other Ambulatory Visit (HOSPITAL_COMMUNITY): Payer: Self-pay | Admitting: Family Medicine

## 2024-07-27 ENCOUNTER — Ambulatory Visit

## 2024-07-27 DIAGNOSIS — Z1283 Encounter for screening for malignant neoplasm of skin: Secondary | ICD-10-CM | POA: Diagnosis not present

## 2024-07-27 DIAGNOSIS — W908XXA Exposure to other nonionizing radiation, initial encounter: Secondary | ICD-10-CM

## 2024-07-27 DIAGNOSIS — L814 Other melanin hyperpigmentation: Secondary | ICD-10-CM

## 2024-07-27 DIAGNOSIS — L578 Other skin changes due to chronic exposure to nonionizing radiation: Secondary | ICD-10-CM

## 2024-07-27 DIAGNOSIS — L820 Inflamed seborrheic keratosis: Secondary | ICD-10-CM | POA: Diagnosis not present

## 2024-07-27 DIAGNOSIS — D2239 Melanocytic nevi of other parts of face: Secondary | ICD-10-CM | POA: Diagnosis not present

## 2024-07-27 DIAGNOSIS — L853 Xerosis cutis: Secondary | ICD-10-CM

## 2024-07-27 DIAGNOSIS — L570 Actinic keratosis: Secondary | ICD-10-CM

## 2024-07-27 DIAGNOSIS — D489 Neoplasm of uncertain behavior, unspecified: Secondary | ICD-10-CM

## 2024-07-27 DIAGNOSIS — D229 Melanocytic nevi, unspecified: Secondary | ICD-10-CM

## 2024-07-27 DIAGNOSIS — D1801 Hemangioma of skin and subcutaneous tissue: Secondary | ICD-10-CM

## 2024-07-27 DIAGNOSIS — L821 Other seborrheic keratosis: Secondary | ICD-10-CM

## 2024-07-27 NOTE — Patient Instructions (Addendum)
 Moisturizer: Apply a moisturizer throughout the day and after bathing.  When you moisturize after bathing, this locks in the moisture.  This can lead to softer and smoother skin.  Body moisturizers come in ointments, creams, and lotions.  If you have dry skin, we recommend the use of ointments or creams rather than lotions.  In other words, something you scoop out of a jar rather than squirted out.  Ointments and creams are thicker and thus provide better moisturization.      Moisturizers Apply a moisturizer to your skin at least once a day (even if you do not bathe).   Cool moisturizers on your skin help with itching (to accomplish this, place your moisturizers and medicated creams in the refrigerator). In general, people with dry skin need a moisturizer that is scooped and not squirted.  - Ointments (petrolatum  ointment): greasy, but are the best moisturizers Vaseline, Aquaphor  - Creams (thick, white cream that comes in a jar and is scooped with your hand) Cerave, Cetaphil, Eucerin, Vanicream  - Lotions (comes in a pump) is the weakest moisturizer but is an acceptable choice for the face if you have an oily face Cetaphil, Cerave, Curel, Neutrogena, Lubriderm, Aveeno   Wound Care Instructions  Cleanse wound gently with soap and water  once a day then pat dry with clean gauze. Apply a thin coat of Petrolatum  (petroleum jelly, Vaseline) over the wound (unless you have an allergy to this). We recommend that you use a new, sterile tube of Vaseline. Do not pick or remove scabs. Do not remove the yellow or white healing tissue from the base of the wound.  Cover the wound with fresh, clean, nonstick gauze and secure with paper tape. You may use Band-Aids in place of gauze and tape if the wound is small enough, but would recommend trimming much of the tape off as there is often too much. Sometimes Band-Aids can irritate the skin.  You should call the office for your biopsy report after 1 week  if you have not already been contacted.  If you experience any problems, such as abnormal amounts of bleeding, swelling, significant bruising, significant pain, or evidence of infection, please call the office immediately.  FOR ADULT SURGERY PATIENTS: If you need something for pain relief you may take 1 extra strength Tylenol  (acetaminophen ) AND 2 Ibuprofen (200mg  each) together every 4 hours as needed for pain. (do not take these if you are allergic to them or if you have a reason you should not take them.) Typically, you may only need pain medication for 1 to 3 days.      Due to recent changes in healthcare laws, you may see results of your pathology and/or laboratory studies on MyChart before the doctors have had a chance to review them. We understand that in some cases there may be results that are confusing or concerning to you. Please understand that not all results are received at the same time and often the doctors may need to interpret multiple results in order to provide you with the best plan of care or course of treatment. Therefore, we ask that you please give us  2 business days to thoroughly review all your results before contacting the office for clarification. Should we see a critical lab result, you will be contacted sooner.   If You Need Anything After Your Visit  If you have any questions or concerns for your doctor, please call our main line at [phone] and press option 4 to reach  your doctor's medical assistant. If no one answers, please leave a voicemail as directed and we will return your call as soon as possible. Messages left after 4 pm will be answered the following business day.   You may also send us  a message via MyChart. We typically respond to MyChart messages within 1-2 business days.  For prescription refills, please ask your pharmacy to contact our office. Our fax number is [phone].  If you have an urgent issue when the clinic is closed that cannot wait  until the next business day, you can page your doctor at the number below.    Please note that while we do our best to be available for urgent issues outside of office hours, we are not available 24/7.   If you have an urgent issue and are unable to reach us , you may choose to seek medical care at your doctor's office, retail clinic, urgent care center, or emergency room.  If you have a medical emergency, please immediately call 911 or go to the emergency department.  Pager Numbers  - Dr. Hester: [phone]  - Dr. Jackquline: [phone]  - Dr. Claudene: [phone]   - Dr. Raymund: [phone]  In the event of inclement weather, please call our main line at [phone] for an update on the status of any delays or closures.  Dermatology Medication Tips: Please keep the boxes that topical medications come in in order to help keep track of the instructions about where and how to use these. Pharmacies typically print the medication instructions only on the boxes and not directly on the medication tubes.   If your medication is too expensive, please contact our office at [phone] option 4 or send us  a message through MyChart.   We are unable to tell what your co-pay for medications will be in advance as this is different depending on your insurance coverage. However, we may be able to find a substitute medication at lower cost or fill out paperwork to get insurance to cover a needed medication.   If a prior authorization is required to get your medication covered by your insurance company, please allow us  1-2 business days to complete this process.  Drug prices often vary depending on where the prescription is filled and some pharmacies may offer cheaper prices.  The website www.goodrx.com contains coupons for medications through different pharmacies. The prices here do not account for what the cost may be with help from insurance (it may be cheaper with your insurance), but the  website can give you the price if you did not use any insurance.  - You can print the associated coupon and take it with your prescription to the pharmacy.  - You may also stop by our office during regular business hours and pick up a GoodRx coupon card.  - If you need your prescription sent electronically to a different pharmacy, notify our office through [facility] or by phone at [phone] option 4.     Si Usted Necesita Algo Despus de Su Visita  Tambin puede enviarnos un mensaje a travs de Clinical Cytogeneticist. Por lo general respondemos a los mensajes de MyChart en el transcurso de 1 a 2 das hbiles.  Para renovar recetas, por favor pida a su farmacia que se ponga en contacto con nuestra oficina. Randi lakes de fax es [place] [phone].  Si tiene un asunto urgente cuando la clnica est cerrada y que no puede esperar hasta el siguiente da hbil, puede llamar/localizar a su doctor(a) al  nmero que aparece a continuacin.   Por favor, tenga en cuenta que aunque hacemos todo lo posible para estar disponibles para asuntos urgentes fuera del horario de [place], no estamos disponibles las 24 horas del da, los 7 [address] de la [place].   Si tiene un problema urgente y no puede comunicarse con nosotros, puede optar por buscar atencin mdica  en el consultorio de su doctor(a), en una clnica privada, en un centro de atencin urgente o en una sala de emergencias.  Si tiene engineer, drilling, por favor llame inmediatamente al 911 o vaya a la sala de emergencias.  Nmeros de bper  - Dr. Hester: [phone]  - Dra. Jackquline: [phone]  - Dr. Claudene: [phone]  - Dra. Kitts: [phone]  En caso de inclemencias del [place], por favor llame a nuestra lnea principal al [phone] para una actualizacin sobre el estado de cualquier retraso o cierre.  Consejos para la medicacin en dermatologa: Por favor, guarde las cajas en las que vienen los medicamentos de uso tpico para  ayudarle a seguir las instrucciones sobre dnde y cmo usarlos. Las farmacias generalmente imprimen las instrucciones del medicamento slo en las cajas y no directamente en los tubos del [place].   Si su medicamento es muy caro, por favor, pngase en contacto con landry rieger llamando al [phone] y presione la opcin 4 o envenos un mensaje a travs de Clinical Cytogeneticist.   No podemos decirle cul ser su copago por los medicamentos por adelantado ya que esto es diferente dependiendo de la cobertura de su seguro. Sin embargo, es posible que podamos encontrar un medicamento sustituto a audiological scientist un formulario para que el seguro cubra el medicamento que se considera necesario.   Si se requiere una autorizacin previa para que su compaa de seguros cubra su medicamento, por favor permtanos de 1 a 2 das hbiles para completar este proceso.  Los precios de los medicamentos varan con frecuencia dependiendo del environmental consultant de dnde se surte la receta y alguna farmacias pueden ofrecer precios ms baratos.  El sitio web www.goodrx.com tiene cupones para medicamentos de health and safety inspector. Los precios aqu no tienen en cuenta lo que podra costar con la ayuda del seguro (puede ser ms barato con su seguro), pero el sitio web puede darle el precio si no utiliz tourist information centre manager.  - Puede imprimir el cupn correspondiente y llevarlo con su receta a la farmacia.  - Tambin puede pasar por nuestra oficina durante el horario de atencin regular y education officer, museum una tarjeta de cupones de GoodRx.  - Si necesita que su receta se enve electrnicamente a una farmacia diferente, informe a nuestra oficina a travs de MyChart de Eugenio Saenz o por telfono llamando al [phone] y presione la opcin 4.

## 2024-07-27 NOTE — Progress Notes (Signed)
 Subjective   [name] Goodman is a 83 y.o. male who presents for the following: Lesion(s) of concern . Patient is new patient  Today patient reports: Area of concern on the left arm.  Area of concern on the lower extremities.   Review of Systems:    No other skin or systemic complaints except as noted in HPI or Assessment and Plan.  The following portions of the chart were reviewed this encounter and updated as appropriate: medications, allergies, medical history  Relevant Medical History:  n/a   Objective  Well appearing patient in no apparent distress; mood and affect are within normal limits. Examination was performed of the: Sun Exposed Exam: Scalp, head, eyes, ears, nose, lips, neck, upper extremities, hands, fingers, fingernails, bilateral lower extremities  Examination notable for: SKIN EXAM, Angioma(s): Scattered red vascular papule(s)  , Lentigo/lentigines: Scattered pigmented macules that are tan to brown in color and are somewhat non-uniform in shape and concentrated in the sun-exposed areas, Nevus/nevi: Scattered well-demarcated, regular, pigmented macule(s) and/or papule(s)  , Seborrheic Keratosis(es): Stuck-on appearing keratotic papule(s) on the trunk, some  irritated with redness, crusting, edema, and/or partial avulsion, Actinic Damage/Elastosis: chronic sun damage: dyspigmentation, telangiectasia, and wrinkling, Actinic keratosis: Scaly erythematous macule(s) concentrated on sun exposed areas  - Diffuse xerosis and scale accentuated on the extremities, particularly the lower extremities and feet Examination limited by: Clothing and Patient deferred removal     left upper extremity, bilateral lower extremities (7) Pink scaly macules Right Cheek 5mm irregular brown papule    Neck - Anterior Stuck on waxy paps with erythema  Assessment & Plan   SKIN CANCER SCREENING PERFORMED TODAY.  BENIGN SKIN FINDINGS  - Lentigines  - Seborrheic keratoses  - Hemangiomas    - Nevus/Multiple Benign Nevi - Reassurance provided regarding the benign appearance of lesions noted on exam today; no treatment is indicated in the absence of symptoms/changes. - Reinforced importance of photoprotective strategies including liberal and frequent sunscreen use of a broad-spectrum SPF 30 or greater, use of protective clothing, and sun avoidance for prevention of cutaneous malignancy and photoaging.  Counseled patient on the importance of regular self-skin monitoring as well as routine clinical skin examinations as scheduled.   ACTINIC DAMAGE - Chronic condition, secondary to cumulative UV/sun exposure - Recommend daily broad spectrum sunscreen SPF 30+ to sun-exposed areas, reapply every 2 hours as needed.  - Staying in the shade or wearing long sleeves, sun glasses (UVA+UVB protection) and wide brim hats (4-inch brim around the entire circumference of the hat) are also recommended for sun protection.  - Call for new or changing lesions.  Xerosis cutis of the bilateral lower extremities  - Discussed diagnosis, typical course, and treatment options for this condition - The patient was advised to use a gentle cleanser such as Dove or Cetaphil and to avoid anti-bacterial soaps which may be too irritating. We recommended the frequent use of a greasy emollient such as Cetaphil, Cerave or Vaseline to moisturize the skin once to twice daily. We also recommended the avoidance of scratching and irritants to the skin.  LOC R cheek - Thinks may have been removed in the past. Discussed clinically abnormal features and recommended bx - ddx melanoma vs recurrent nevus   Level of service outlined above   Procedures, orders, diagnosis for this visit:  ACTINIC KERATOSIS (7) left upper extremity, bilateral lower extremities (7) Actinic keratoses are precancerous spots that appear secondary to cumulative UV radiation exposure/sun exposure over time. They are  chronic with expected duration over 1  year. A portion of actinic keratoses will progress to squamous cell carcinoma of the skin. It is not possible to reliably predict which spots will progress to skin cancer and so treatment is recommended to prevent development of skin cancer.  Recommend daily broad spectrum sunscreen SPF 30+ to sun-exposed areas, reapply every 2 hours as needed.  Recommend staying in the shade or wearing long sleeves, sun glasses (UVA+UVB protection) and wide brim hats (4-inch brim around the entire circumference of the hat). Call for new or changing lesions. Destruction of lesion - left upper extremity, bilateral lower extremities (7) Complexity: simple   Destruction method: cryotherapy   Informed consent: discussed and consent obtained   Timeout:  patient name, date of birth, surgical site, and procedure verified Lesion destroyed using liquid nitrogen: Yes   Region frozen until ice ball extended beyond lesion: Yes   Cryo cycles: 1 or 2. Outcome: patient tolerated procedure well with no complications   Post-procedure details: wound care instructions given    NEOPLASM OF UNCERTAIN BEHAVIOR Right Cheek Skin / nail biopsy Type of biopsy: tangential   Informed consent: discussed and consent obtained   Timeout: patient name, date of birth, surgical site, and procedure verified   Procedure prep:  Patient was prepped and draped in usual sterile fashion Prep type:  Isopropyl alcohol Anesthesia: the lesion was anesthetized in a standard fashion   Anesthetic:  1% lidocaine  w/ epinephrine  1-100,000 buffered w/ 8.4% NaHCO3 Instrument used: DermaBlade   Hemostasis achieved with: pressure and aluminum chloride   Outcome: patient tolerated procedure well   Post-procedure details: sterile dressing applied and wound care instructions given   Dressing type: bandage and petrolatum     Specimen 1 - Surgical pathology Differential Diagnosis: Melanoma vs Recurrent nevus   Check Margins: No INFLAMED SEBORRHEIC  KERATOSIS Neck - Anterior Symptomatic, irritating, patient would like treated. Destruction of lesion - Neck - Anterior Complexity: simple   Destruction method: cryotherapy   Informed consent: discussed and consent obtained   Timeout:  patient name, date of birth, surgical site, and procedure verified Lesion destroyed using liquid nitrogen: Yes   Region frozen until ice ball extended beyond lesion: Yes   Cryo cycles: 1 or 2. Outcome: patient tolerated procedure well with no complications   Post-procedure details: wound care instructions given     Actinic keratosis -     Destruction of lesion  Neoplasm of uncertain behavior -     Skin / nail biopsy -     Surgical pathology; Standing  Inflamed seborrheic keratosis -     Destruction of lesion    Return to clinic: Return in about 6 months (around 01/24/2025) for TBSE.  I, Emerick Ege, CMA am acting as scribe for Lauraine JAYSON Kanaris, MD.   Documentation: I have reviewed the above documentation for accuracy and completeness, and I agree with the above.  Lauraine JAYSON Kanaris, MD

## 2024-07-29 LAB — SURGICAL PATHOLOGY

## 2024-08-01 ENCOUNTER — Ambulatory Visit: Payer: Self-pay

## 2024-08-01 NOTE — Telephone Encounter (Addendum)
 Patient returned call. Give patient bx results. He denied further questions and verbalized understanding.    ----- Message from Lauraine JAYSON Kanaris sent at 08/01/2024  8:29 AM EST -----       1. Skin, right cheek :       MELANOCYTIC NEVUS, INTRADERMAL TYPE, IRRITATED   Please notify patient with below plan: Benign, observe.   ----- Message ----- From: Interface, Lab [facility] Sent: 07/29/2024   4:00 PM EST To: Lauraine JAYSON Kanaris, MD

## 2024-08-01 NOTE — Progress Notes (Signed)
 LMTRC

## 2024-08-05 ENCOUNTER — Telehealth: Payer: Self-pay | Admitting: Internal Medicine

## 2024-08-05 NOTE — Telephone Encounter (Signed)
 Called to confirm/remind patient of their appointment [facility] on 08/08/[facility].   Appointment:   [] Confirmed  [x] Left mess   [] No answer/No voice mail  [] VM Full/unable to leave message  [] Phone not in service  Patient reminded to bring all medications and/or complete list.  Confirmed patient has transportation. Gave directions, instructed to utilize valet parking.

## 2024-08-08 ENCOUNTER — Encounter: Payer: Self-pay | Admitting: Internal Medicine

## 2024-08-08 ENCOUNTER — Ambulatory Visit: Attending: Internal Medicine | Admitting: Internal Medicine

## 2024-08-08 VITALS — BP 164/90 | HR 62 | Wt 214.4 lb

## 2024-08-08 DIAGNOSIS — I4819 Other persistent atrial fibrillation: Secondary | ICD-10-CM | POA: Diagnosis not present

## 2024-08-08 DIAGNOSIS — Z955 Presence of coronary angioplasty implant and graft: Secondary | ICD-10-CM | POA: Insufficient documentation

## 2024-08-08 DIAGNOSIS — Z951 Presence of aortocoronary bypass graft: Secondary | ICD-10-CM | POA: Insufficient documentation

## 2024-08-08 DIAGNOSIS — Z86711 Personal history of pulmonary embolism: Secondary | ICD-10-CM | POA: Insufficient documentation

## 2024-08-08 DIAGNOSIS — Z7901 Long term (current) use of anticoagulants: Secondary | ICD-10-CM | POA: Insufficient documentation

## 2024-08-08 DIAGNOSIS — N1831 Chronic kidney disease, stage 3a: Secondary | ICD-10-CM | POA: Diagnosis not present

## 2024-08-08 DIAGNOSIS — Z7984 Long term (current) use of oral hypoglycemic drugs: Secondary | ICD-10-CM | POA: Diagnosis not present

## 2024-08-08 DIAGNOSIS — I5022 Chronic systolic (congestive) heart failure: Secondary | ICD-10-CM | POA: Diagnosis present

## 2024-08-08 DIAGNOSIS — I130 Hypertensive heart and chronic kidney disease with heart failure and stage 1 through stage 4 chronic kidney disease, or unspecified chronic kidney disease: Secondary | ICD-10-CM | POA: Insufficient documentation

## 2024-08-08 DIAGNOSIS — I4892 Unspecified atrial flutter: Secondary | ICD-10-CM | POA: Insufficient documentation

## 2024-08-08 DIAGNOSIS — Z79899 Other long term (current) drug therapy: Secondary | ICD-10-CM | POA: Diagnosis not present

## 2024-08-08 DIAGNOSIS — I2510 Atherosclerotic heart disease of native coronary artery without angina pectoris: Secondary | ICD-10-CM | POA: Diagnosis not present

## 2024-08-08 DIAGNOSIS — I110 Hypertensive heart disease with heart failure: Secondary | ICD-10-CM | POA: Diagnosis present

## 2024-08-08 DIAGNOSIS — M109 Gout, unspecified: Secondary | ICD-10-CM | POA: Insufficient documentation

## 2024-08-08 DIAGNOSIS — I493 Ventricular premature depolarization: Secondary | ICD-10-CM | POA: Insufficient documentation

## 2024-08-08 DIAGNOSIS — I480 Paroxysmal atrial fibrillation: Secondary | ICD-10-CM | POA: Diagnosis not present

## 2024-08-08 DIAGNOSIS — I340 Nonrheumatic mitral (valve) insufficiency: Secondary | ICD-10-CM | POA: Insufficient documentation

## 2024-08-08 NOTE — Progress Notes (Signed)
 ADVANCED HF CLINIC NOTE   Primary Care: Valora Lynwood FALCON, MD Primary Cardiologist: Dr. Ammon HF Cardiologist: Dr. Cherrie  HPI: [name] Goodman is an 83 y.o.male with CAD s/p CABG 08/23, HFmEF, atrial fibrillation, HTN.    Underwent CABG X 5 in 8/23. Intra-op TEE with LVEF 40-45%. Had postoperative AF and developed bradycardia on amiodarone  drip. Converted to SR.    Readmitted on 05/18/22 with segmental and subsegmental PE RML/RUL, CHF, left pleural effusion s/p thoracentesis and recurrent atrial fibrillation. Echo EF 25-30%, mildly RV HK. Converted to SR during admit. PVCs also noted on tele. Had early graft failure with occlusion of SVG to Diag and SVG to RCA occluded. LIMA to LAD and radial jump graft to OM and RPLV patent. Underwent successful staged PCI/DES to distal RCA and PCI/DES p Cx into OM1.   Zio 9/23 showed AF/AFL burden 84% with PVC burden of 18.6%. Started on amiodarone .  F/u 10/23, stable NYHA II-early III and volume OK.  Zio 11/23 Continuous AFL (avg 76 bpm) PVC 10.7% (2 morphologies 6.0% & 4.7%)   Echo 08/27/22 EF 25-30% mod-sev MR.  Had DC-CV 09/02/22   cMRI 3/24: EF 43% RVEF 40% Mod MR. Coronary pattern LGE in the mid inferoseptal wall (>50% wall thickness, unlikely to be viable) and the basal inferolateral wall (<50% wall thickness, likely viable).  Admitted 03/16/23 with volume overload. Diuresed with IV lasix . Lasix  started MWF  Echo 3/25 EF 35-40% RV ok. Mild MR Personally reviewed  Here for f/u. Doing pretty well. Main issue is back pain. Walking 3-4 miles/day. No CP or SOB. Edema well controlled. Continues on lasix  3x/week. BP at home 120-130/60-70s     Past Medical History:  Diagnosis Date   Arthritis    Atrial fibrillation (HCC)    Cardiac left ventricular ejection fraction 30-35 percent    Chronic systolic (congestive) heart failure (HCC)    Grade I diastolic dysfunction    History of kidney stones    History of pulmonary embolus (PE)     Hyperlipidemia    Hypertension    Mild mitral regurgitation by prior echocardiogram    Nocturia    S/P CABG (coronary artery bypass graft)    S/P CABG x 5     Current Outpatient Medications  Medication Sig Dispense Refill   allopurinol  (ZYLOPRIM ) 100 MG tablet Take 1 tablet (100 mg total) by mouth daily. 90 tablet 3   apixaban  (ELIQUIS ) 2.5 MG TABS tablet Take 1 tablet (2.5 mg total) by mouth 2 (two) times daily. 60 tablet 11   atorvastatin  (LIPITOR ) 80 MG tablet TAKE ONE TABLET BY MOUTH ONE TIME DAILY 30 tablet 7   cyanocobalamin (VITAMIN B12) 1000 MCG tablet Take 1,000 mcg by mouth daily.     empagliflozin  (JARDIANCE ) 10 MG TABS tablet Take 1 tablet (10 mg total) by mouth daily. 30 tablet 7   furosemide  (LASIX ) 40 MG tablet Take  40 mg daily on Mondays, Wednesdays and Friday 30 tablet 6   metoprolol  succinate (TOPROL -XL) 50 MG 24 hr tablet Take 1 tablet (50 mg total) by mouth daily. Take with or immediately following a meal. 30 tablet 6   sacubitril -valsartan  (ENTRESTO ) 97-103 MG Take 1 tablet by mouth 2 (two) times daily. 60 tablet 6   No current facility-administered medications for this visit.   No Known Allergies  Social History   Socioeconomic History   Marital status: Divorced    Spouse name: Not on file   Number of children: 3   Years  of education: Not on file   Highest education level: Master's degree (e.g., MA, MS, MEng, MEd, MSW, MBA)  Occupational History   Occupation: retired psychologist, occupational at [employer]  Tobacco Use   Smoking status: Former    Current packs/day: 0.00    Average packs/day: 1 pack/day for 15.0 years (15.0 ttl pk-yrs)    Types: Cigarettes    Start date: 07/16/1960    Quit date: 07/17/1975    Years since quitting: 49.0    Passive exposure: Past   Smokeless tobacco: Never  Vaping Use   Vaping status: Never Used  Substance and Sexual Activity   Alcohol use: No   Drug use: No   Sexual activity: Not on file  Other Topics Concern   Not on file  Social  History Narrative   Lives alone   Social Drivers of Health   Financial Resource Strain: Patient Declined (07/14/2024)   Received from [facility]   Overall Financial Resource Strain (CARDIA)    Difficulty of Paying Living Expenses: Patient declined  Food Insecurity: Patient Declined (07/14/2024)   Received from [facility]   Hunger Vital Sign    Within the past 12 months, you worried that your food would run out before you got the money to buy more.: Patient declined    Within the past 12 months, the food you bought just didn't last and you didn't have money to get more.: Patient declined  Transportation Needs: Patient Declined (07/14/2024)   Received from [facility]   PRAPARE - Transportation    In the past 12 months, has lack of transportation kept you from medical appointments or from getting medications?: Patient declined    Lack of Transportation (Non-Medical): Patient declined  Physical Activity: Not on file  Stress: Not on file  Social Connections: Not on file  Intimate Partner Violence: Not on file   Family History  Problem Relation Age of Onset   Hypertension Father    Cancer Father    BP (!) 164/90   Pulse 62   Wt 214 lb 6.4 oz (97.3 kg)   SpO2 98%   BMI 28.29 kg/m   Wt Readings from Last 3 Encounters:  08/08/24 214 lb 6.4 oz (97.3 kg)  06/07/24 211 lb (95.7 kg)  02/25/24 217 lb (98.4 kg)   PHYSICAL EXAM: General:  Sitting up No resp difficulty HEENT: normal Neck: supple. no JVD.  Cor: Regular rate & rhythm. No rubs, gallops or murmurs. Lungs: clear Abdomen: soft, nontender, nondistended.Good bowel sounds. Extremities: no cyanosis, clubbing, rash, edema Neuro: alert & orientedx3, cranial nerves grossly intact. moves all 4 extremities w/o difficulty. Affect pleasant  ECG: Sinus brady 59 LVH 1 PVC Personally reviewed  ASSESSMENT & PLAN:  Chronic systolic CHF - pre-CABG LV gram (7/23): EF 35-40%  -  Intraop TEE 05/08/22: EF 40-45% - Limited echo 05/19/22: EF 25-30%, RV mildly reduced - S/p PCI/DES to RCA and PCI/DES to p Lcx into OM1 9/23 (see below).  - Echo 08/27/22 EF 25-30% moderate to severe MR. - cMRI 3/24: EF 43% RVEF 40% Mod MR. Coronary pattern LGE in the mid inferoseptal wall (>50% wall thickness, unlikely to be viable) and the basal inferolateral wall (<50% wall thickness, likely viable). - suspect primarily iCM. Had early graft failure post CABG. vs PVC  - Echo 03/16/23: EF 35-40%  - Echo 3/25 EF 35-40% RV ok. Mild MR Personally reviewed - Doing well NYHA I-II Continue lasix  40mg  at least 3x/week -  Continue Entresto  97/103 bid  - Off spiro due to AKI and hyperkalemia - Continue Jardiance  10 mg daily. - Continue Toprol  50  - Repeat echo next visit - Labs today  2. CAD - S/p CABG X 5 (LIMA to LAD, SVG to PDA, SVG to D1, sequential left radial artery to OM and PL) 05/08/22 - Early graft failure with occlusion of SVG to D1 and SVG to RCA. LIMA to LAD and radial graft to OM1 and RPLV patent - No s/s angina - Continue Eliquis /statin   3. Persistent atrial fibrillation/AFL  - Noted post CABG. - Had recurrence AF in 9/24 - Zio 2 week (9/23) showed mostly AF/AFL 84% burden, frequent PVCs (18.6%) - Zio 11/23 100% AFL  - s/p DC-CV 12/23 - Zio 5/24 No AF Rare PVCs - Has seen Dr. Nancey and discussed possible ablation in 4/24. Has cancelled.  - Remains in NSR off amio - Continue Eliquis  2.5 bid (age > 75 Scr 1.6)  4. Frequent PVCs - Zio 2 week (9/23): PVCs 18.6% - Previously discussed sleep study, he was not interested - Repeat Zio 2 11/23 10.7% PVCs - Amio stopped 6/25 - Zio 5/24 No AF Rare PVCs - ECG today with 1 PVC. Repeat zio as needed - will likely due at next visit to requantify PVCs   5. HTN - Blood pressure elevated here but well controlled at home  6. CKD IIIa - Baseline Scr 1.3-1.6 - Check labs today   7. Mitral regurgitation - moderate to severe on  previosus echo - likely ischemic (vs functional) - cMRI 3/24 moderate MR - echo 6/24 mild to moderate MR  - Echo 3/25 mild MR - repeat echo ordered  8. Gout - No recent flares after starting allopurinol  - Continue allopurinol    Walt Geathers, MD  2:59 PM

## 2024-08-08 NOTE — Patient Instructions (Addendum)
 Medication Changes:  No medication changes today!  Lab Work:   Go downstairs to [facility] on [address] to have your blood work completed.  We will only call you if the results are abnormal or if the provider would like to make medication changes.  No news is good news.     Testing/Procedures:  Your physician has requested that you have an echocardiogram. Echocardiography is a painless test that uses sound waves to create images of your heart. It provides your doctor with information about the size and shape of your heart and how well your heart's chambers and valves are working. This procedure takes approximately one hour. There are no restrictions for this procedure. Please do NOT wear cologne, perfume, aftershave, or lotions (deodorant is allowed). Please arrive 15 minutes prior to your appointment time.  Please note: We ask at that you not bring children with you during ultrasound (echo/ vascular) testing. Due to room size and safety concerns, children are not allowed in the ultrasound rooms during exams. Our front office staff cannot provide observation of children in our lobby area while testing is being conducted. An adult accompanying a patient to their appointment will only be allowed in the ultrasound room at the discretion of the ultrasound technician under special circumstances. We apologize for any inconvenience.  Someone will contact you in order to schedule your appointment.  Follow-Up in: Please follow up with the [facility] in 6 months with Dr. Cherrie. We do not currently have that schedule. Please give us  a call in April in order to schedule your appointment for May 2026.   Thank you for choosing [facility].    At the [facility], you and your health needs are our priority. We have a designated team specialized in the treatment of Heart Failure. This Care Team includes your primary Heart  Failure Specialized Cardiologist (physician), Advanced Practice Providers (APPs- Physician Assistants and Nurse Practitioners), and Pharmacist who all work together to provide you with the care you need, when you need it.   You may see any of the following providers on your designated Care Team at your next follow up:  Dr. Toribio Cherrie Dr. Ezra Shuck Dr. Ria Commander Dr. Morene Brownie Ellouise Class, FNP Jaun Bash, RPH-CPP  Please be sure to bring in all your medications bottles to every appointment.   Need to Contact Us :  If you have any questions or concerns before your next appointment please send us  a message through [place] or call our office at [phone].    TO LEAVE A MESSAGE FOR THE NURSE SELECT OPTION 2, PLEASE LEAVE A MESSAGE INCLUDING: YOUR NAME DATE OF BIRTH CALL BACK NUMBER REASON FOR CALL**this is important as we prioritize the call backs  YOU WILL RECEIVE A CALL BACK THE SAME DAY AS LONG AS YOU CALL BEFORE 4:00 PM

## 2024-08-09 LAB — BRAIN NATRIURETIC PEPTIDE: BNP: 423.1 pg/mL — ABNORMAL HIGH (ref 0.0–100.0)

## 2024-08-09 LAB — CBC
Hematocrit: 46.9 % (ref 37.5–51.0)
Hemoglobin: 14.7 g/dL (ref 13.0–17.7)
MCH: 27.5 pg (ref 26.6–33.0)
MCHC: 31.3 g/dL — ABNORMAL LOW (ref 31.5–35.7)
MCV: 88 fL (ref 79–97)
Platelets: 175 x10E3/uL (ref 150–450)
RBC: 5.35 x10E6/uL (ref 4.14–5.80)
RDW: 15.3 % (ref 11.6–15.4)
WBC: 5.8 x10E3/uL (ref 3.4–10.8)

## 2024-08-09 LAB — BASIC METABOLIC PANEL WITH GFR
BUN/Creatinine Ratio: 16 (ref 10–24)
BUN: 25 mg/dL (ref 8–27)
CO2: 23 mmol/L (ref 20–29)
Calcium: 9.6 mg/dL (ref 8.6–10.2)
Chloride: 103 mmol/L (ref 96–106)
Creatinine, Ser: 1.55 mg/dL — ABNORMAL HIGH (ref 0.76–1.27)
Glucose: 99 mg/dL (ref 70–99)
Potassium: 4.5 mmol/L (ref 3.5–5.2)
Sodium: 142 mmol/L (ref 134–144)
eGFR: 44 mL/min/1.73 — ABNORMAL LOW (ref >59)

## 2024-08-15 ENCOUNTER — Other Ambulatory Visit (HOSPITAL_COMMUNITY): Payer: Self-pay | Admitting: Family Medicine

## 2024-12-09 ENCOUNTER — Ambulatory Visit: Admitting: Urology

## 2025-01-25 ENCOUNTER — Ambulatory Visit
